# Patient Record
Sex: Male | Born: 1955 | Race: White | Hispanic: No | Smoking: Former smoker
Health system: Southern US, Community
[De-identification: ages and names within clinical notes are randomized; demographics above are authoritative.]

## PROBLEM LIST (undated history)

## (undated) DIAGNOSIS — E785 Hyperlipidemia, unspecified: Secondary | ICD-10-CM

## (undated) DIAGNOSIS — I428 Other cardiomyopathies: Principal | ICD-10-CM

## (undated) DIAGNOSIS — M1990 Unspecified osteoarthritis, unspecified site: Secondary | ICD-10-CM

## (undated) DIAGNOSIS — F419 Anxiety disorder, unspecified: Secondary | ICD-10-CM

## (undated) DIAGNOSIS — E66811 Obesity, class 1: Secondary | ICD-10-CM

## (undated) DIAGNOSIS — K219 Gastro-esophageal reflux disease without esophagitis: Secondary | ICD-10-CM

## (undated) DIAGNOSIS — I10 Essential (primary) hypertension: Secondary | ICD-10-CM

## (undated) DIAGNOSIS — F32A Depression, unspecified: Secondary | ICD-10-CM

## (undated) DIAGNOSIS — I209 Angina pectoris, unspecified: Secondary | ICD-10-CM

## (undated) DIAGNOSIS — G4730 Sleep apnea, unspecified: Secondary | ICD-10-CM

## (undated) DIAGNOSIS — F329 Major depressive disorder, single episode, unspecified: Secondary | ICD-10-CM

## (undated) DIAGNOSIS — Z8249 Family history of ischemic heart disease and other diseases of the circulatory system: Secondary | ICD-10-CM

## (undated) DIAGNOSIS — E669 Obesity, unspecified: Secondary | ICD-10-CM

## (undated) DIAGNOSIS — Z872 Personal history of diseases of the skin and subcutaneous tissue: Secondary | ICD-10-CM

## (undated) DIAGNOSIS — G4733 Obstructive sleep apnea (adult) (pediatric): Secondary | ICD-10-CM

## (undated) DIAGNOSIS — I2510 Atherosclerotic heart disease of native coronary artery without angina pectoris: Secondary | ICD-10-CM

## (undated) HISTORY — DX: Major depressive disorder, single episode, unspecified: F32.9

## (undated) HISTORY — DX: Essential (primary) hypertension: I10

## (undated) HISTORY — DX: Other cardiomyopathies: I42.8

## (undated) HISTORY — DX: Obesity, class 1: E66.811

## (undated) HISTORY — DX: Anxiety disorder, unspecified: F41.9

## (undated) HISTORY — DX: Obesity, unspecified: E66.9

## (undated) HISTORY — DX: Sleep apnea, unspecified: G47.30

## (undated) HISTORY — DX: Personal history of diseases of the skin and subcutaneous tissue: Z87.2

## (undated) HISTORY — DX: Obstructive sleep apnea (adult) (pediatric): G47.33

## (undated) HISTORY — DX: Depression, unspecified: F32.A

## (undated) HISTORY — DX: Family history of ischemic heart disease and other diseases of the circulatory system: Z82.49

## (undated) HISTORY — DX: Hyperlipidemia, unspecified: E78.5

## (undated) HISTORY — DX: Atherosclerotic heart disease of native coronary artery without angina pectoris: I25.10

## (undated) HISTORY — DX: Gastro-esophageal reflux disease without esophagitis: K21.9

## (undated) HISTORY — PX: POLYPECTOMY: SHX149

---

## 2000-08-22 ENCOUNTER — Emergency Department (HOSPITAL_COMMUNITY): Admission: EM | Admit: 2000-08-22 | Discharge: 2000-08-22 | Payer: Self-pay | Admitting: Emergency Medicine

## 2000-08-22 ENCOUNTER — Encounter: Payer: Self-pay | Admitting: Emergency Medicine

## 2001-05-09 ENCOUNTER — Ambulatory Visit (HOSPITAL_COMMUNITY): Admission: RE | Admit: 2001-05-09 | Discharge: 2001-05-09 | Payer: Self-pay | Admitting: Family Medicine

## 2001-05-09 ENCOUNTER — Encounter: Payer: Self-pay | Admitting: Family Medicine

## 2003-12-21 ENCOUNTER — Ambulatory Visit (HOSPITAL_COMMUNITY): Admission: RE | Admit: 2003-12-21 | Discharge: 2003-12-21 | Payer: Self-pay | Admitting: Neurosurgery

## 2013-05-14 ENCOUNTER — Encounter (HOSPITAL_COMMUNITY): Payer: Self-pay | Admitting: Emergency Medicine

## 2013-05-14 DIAGNOSIS — R5381 Other malaise: Secondary | ICD-10-CM | POA: Insufficient documentation

## 2013-05-14 DIAGNOSIS — F17200 Nicotine dependence, unspecified, uncomplicated: Secondary | ICD-10-CM | POA: Insufficient documentation

## 2013-05-14 DIAGNOSIS — R0609 Other forms of dyspnea: Secondary | ICD-10-CM | POA: Insufficient documentation

## 2013-05-14 DIAGNOSIS — R0602 Shortness of breath: Secondary | ICD-10-CM | POA: Insufficient documentation

## 2013-05-14 DIAGNOSIS — Z880 Allergy status to penicillin: Secondary | ICD-10-CM | POA: Insufficient documentation

## 2013-05-14 DIAGNOSIS — R0989 Other specified symptoms and signs involving the circulatory and respiratory systems: Secondary | ICD-10-CM | POA: Insufficient documentation

## 2013-05-14 DIAGNOSIS — R072 Precordial pain: Secondary | ICD-10-CM | POA: Insufficient documentation

## 2013-05-14 LAB — BASIC METABOLIC PANEL
CO2: 22 mEq/L (ref 19–32)
Calcium: 9.1 mg/dL (ref 8.4–10.5)
Creatinine, Ser: 1.17 mg/dL (ref 0.50–1.35)
GFR calc non Af Amer: 68 mL/min — ABNORMAL LOW (ref >90)
Glucose, Bld: 98 mg/dL (ref 70–99)
Potassium: 4.2 mEq/L (ref 3.5–5.1)
Sodium: 139 mEq/L (ref 135–145)

## 2013-05-14 LAB — CBC
HCT: 46 % (ref 39.0–52.0)
Hemoglobin: 16.3 g/dL (ref 13.0–17.0)
MCH: 31.6 pg (ref 26.0–34.0)
MCHC: 35.4 g/dL (ref 30.0–36.0)
Platelets: 197 10*3/uL (ref 150–400)
RBC: 5.16 MIL/uL (ref 4.22–5.81)

## 2013-05-14 LAB — POCT I-STAT TROPONIN I: Troponin i, poc: 0 ng/mL (ref 0.00–0.08)

## 2013-05-14 NOTE — ED Notes (Signed)
Pt sent from [facility].  Pt was seen there for SOB.  They advised that pt had rapid HR and sent him here for further eval.  Pt alert and oriented and in NAD.

## 2013-05-15 ENCOUNTER — Emergency Department (HOSPITAL_COMMUNITY): Payer: Self-pay

## 2013-05-15 ENCOUNTER — Emergency Department (HOSPITAL_COMMUNITY)
Admission: EM | Admit: 2013-05-15 | Discharge: 2013-05-15 | Disposition: A | Discharge status: Home / Self Care | Payer: Self-pay | Attending: Emergency Medicine | Admitting: Emergency Medicine

## 2013-05-15 ENCOUNTER — Telehealth: Payer: Self-pay | Admitting: General Surgery

## 2013-05-15 DIAGNOSIS — R079 Chest pain, unspecified: Secondary | ICD-10-CM

## 2013-05-15 MED ORDER — ASPIRIN 81 MG PO CHEW
324.0000 mg | CHEWABLE_TABLET | Freq: Once | ORAL | Status: AC
  Administered 2013-05-15: 324 mg via ORAL
  Filled 2013-05-15: qty 4

## 2013-05-15 MED ORDER — NITROGLYCERIN 0.4 MG SL SUBL: 0.4000 mg | SUBLINGUAL_TABLET | SUBLINGUAL | Status: DC | PRN

## 2013-05-15 MED ORDER — ASPIRIN 325 MG PO TABS
325.0000 mg | ORAL_TABLET | ORAL | Status: DC
  Filled 2013-05-15: qty 1

## 2013-05-15 NOTE — Telephone Encounter (Signed)
Was able to contact pt. He is aware we will be ordering a stress test for him and setting him up for a hospital f/u visit a week after stress test is done

## 2013-05-15 NOTE — Telephone Encounter (Signed)
Dr. Mayford Knife stated she just wants the pt to be set up for a OV first then we will set him up for stress test while he is in office. Sherron Monday with pt and he stated she did not have any appt until late November and his sister might have scheduled him to see another Dr. He will call me back to let me know.

## 2013-05-15 NOTE — Telephone Encounter (Signed)
Message copied by Nita Sells on Thu May 15, 2013 10:40 AM ------      Message from: Armanda Magic R      Created: Thu May 15, 2013 10:17 AM       Please order a stress myoview under my name for CP ------

## 2013-05-15 NOTE — Telephone Encounter (Signed)
Pt is set up with another Dr. On Nov 7th.

## 2013-05-15 NOTE — Progress Notes (Signed)
Please order a stress myoview for this patent under my name

## 2013-05-15 NOTE — Telephone Encounter (Signed)
Pts number is disconnected as well as emergency contact. Who referred him to [place] so I can try and get a contact number from them?

## 2013-05-15 NOTE — ED Provider Notes (Signed)
CSN: [id number]     Arrival date & time 05/14/13  1804 History   First MD Initiated Contact with Patient 05/15/13 0104     Chief Complaint  Patient presents with  . Shortness of Breath  . Chest Pain   (Consider location/radiation/quality/duration/timing/severity/associated sxs/prior Treatment) Patient is a 57 y.o. male presenting with shortness of breath and chest pain. The history is provided by the patient.  Shortness of Breath Associated symptoms: chest pain   Chest Pain Associated symptoms: shortness of breath   For the last 2 weeks, he has been having exertional dyspnea. He has to walk about 50 yards before he gets dyspnea and this is definitely worse if he goes uphill. For the last 3 days, he has also had some sharp midsternal chest pain at the same time. Pain has been 6/10 at its worst. Last episode of pain was about 1 PM today. He also has a great fatigue in his legs at the same time. He will sit for about 10 minutes and his dyspnea and chest pain will resolve his legs still feel fatigued for several hours. He denies nausea, vomiting, diaphoresis. He denies any nocturnal symptoms. As cardiac risk factors are cigarette smoking-he smokes one pack of cigarettes a day, and positive family history-2 siblings have had heart attacks and one has died. He denies history of hypertension, diabetes, hyperlipidemia. Also, he states that he generally wakes up feeling fatigued and has daytime sleepiness and his wife states that he will frequently snore.   History reviewed. No pertinent past medical history. History reviewed. No pertinent past surgical history. No family history on file. History  Substance Use Topics  . Smoking status: Current Every Day Smoker -- 2.00 packs/day    Types: Cigarettes  . Smokeless tobacco: Not on file  . Alcohol Use: Yes    Review of Systems  Respiratory: Positive for shortness of breath.   Cardiovascular: Positive for chest pain.  All other systems reviewed and  are negative.    Allergies  Penicillins  Home Medications   Current Outpatient Rx  Name  Route  Sig  Dispense  Refill  . famotidine (PEPCID) 20 MG tablet   Oral   Take 20 mg by mouth 2 (two) times daily as needed for heartburn.         Marland Kitchen ibuprofen (ADVIL,MOTRIN) 200 MG tablet   Oral   Take 200 mg by mouth every 6 (six) hours as needed for pain (pain).          BP 125/92  Pulse 78  Temp(Src) 97.8 F (36.6 C) (Oral)  Resp 16  SpO2 96% Physical Exam  Nursing note and vitals reviewed.  57 year old male, resting comfortably and in no acute distress. Vital signs are normal. Oxygen saturation is 95%, which is normal. Head is normocephalic and atraumatic. PERRLA, EOMI. Oropharynx is clear. Neck is nontender and supple without adenopathy or JVD. Back is nontender and there is no CVA tenderness. Lungs are clear without rales, wheezes, or rhonchi. Chest is nontender. Heart has regular rate and rhythm without murmur. Abdomen is soft, flat, nontender without masses or hepatosplenomegaly and peristalsis is normoactive. Extremities have no cyanosis or edema, full range of motion is present. Skin is warm and dry without rash. Neurologic: Mental status is normal, cranial nerves are intact, there are no motor or sensory deficits.  ED Course  Procedures (including critical care time) Labs Review Results for orders placed during the hospital encounter of 05/15/13  CBC  Result Value Range   WBC 13.7 (*) 4.0 - 10.5 K/uL   RBC 5.16  4.22 - 5.81 MIL/uL   Hemoglobin 16.3  13.0 - 17.0 g/dL   HCT 47.8  29.5 - 62.1 %   MCV 89.1  78.0 - 100.0 fL   MCH 31.6  26.0 - 34.0 pg   MCHC 35.4  30.0 - 36.0 g/dL   RDW 30.8  65.7 - 84.6 %   Platelets 197  150 - 400 K/uL  BASIC METABOLIC PANEL      Result Value Range   Sodium 139  135 - 145 mEq/L   Potassium 4.2  3.5 - 5.1 mEq/L   Chloride 104  96 - 112 mEq/L   CO2 22  19 - 32 mEq/L   Glucose, Bld 98  70 - 99 mg/dL   BUN 28 (*) 6 - 23  mg/dL   Creatinine, Ser 9.62  0.50 - 1.35 mg/dL   Calcium 9.1  8.4 - 95.2 mg/dL   GFR calc non Af Amer 68 (*) >90 mL/min   GFR calc Af Amer 79 (*) >90 mL/min  POCT I-STAT TROPONIN I      Result Value Range   Troponin i, poc 0.00  0.00 - 0.08 ng/mL   Comment 3            Imaging Review Dg Chest Port 1 View  05/15/2013   CLINICAL DATA:  Shortness of breath and chest pain  EXAM: PORTABLE CHEST - 1 VIEW  COMPARISON:  None.  FINDINGS: The heart size and mediastinal contours are within normal limits. Both lungs are clear of edema or consolidation. No effusion or pneumothorax. The visualized skeletal structures are unremarkable.  IMPRESSION: No evidence of active cardiopulmonary disease.   Electronically Signed   By: Tiburcio Pea M.D.   On: 05/15/2013 01:01    EKG Interpretation     Ventricular Rate:  99 PR Interval:  150 QRS Duration: 94 QT Interval:  350 QTC Calculation: 449 R Axis:   42 Text Interpretation:  Sinus rhythm with Premature atrial complexes Otherwise normal ECG When compared with ECG of 08/22/2000, No significant change was found            MDM   1. Exertional dyspnea   2. Chest pain    Chest pain and dyspnea which could be angina. Of note, his troponin was drawn approximately 6 hours after his last episode of chest pain, so he does not need followup troponin. He clearly needs stress testing done and he is referred to cardiology for stress testing as an outpatient. His leg symptoms could be related to cardiac disease, peripheral vascular disease, or spinal stenosis. His nighttime snoring and waking feeling fatigue and daytime drowsiness are suggestive of obstructive sleep apnea. This can be investigated once he has had adequate cardiac evaluation done. He is told to take aspirin once a day and is given a prescription for nitroglycerin and to return to the ED if he has symptoms that are not relieved by 3 nitroglycerin.    Dione Booze, MD 05/15/13 [phone]

## 2013-05-15 NOTE — ED Notes (Signed)
Chest pain for couple of weeks, iniitally minor but gradually worsened.  Monday patient states he came home early Monday morning because he couldn't breathe, pt states taht the pain was so bad he couldn't drive, also states at the time he was dizzy.  Since that time the chest pain and sob has been intermittently worse and patient was unable to finish work today.  Pt also states that he is unable to walk as far as usual because his legs give out and start hurting, and they are cold all the time.

## 2013-05-15 NOTE — Telephone Encounter (Signed)
Patient is an unassigned patient that was seen in the ER yesterday. Please try to get any info you can from the ER for contact numbers. If we cannot contact him by phone then needs to send him a letter to set up OV with one of the Cardiologists.

## 2013-05-17 DIAGNOSIS — I428 Other cardiomyopathies: Secondary | ICD-10-CM | POA: Insufficient documentation

## 2013-05-17 HISTORY — DX: Other cardiomyopathies: I42.8

## 2013-05-23 ENCOUNTER — Ambulatory Visit (INDEPENDENT_AMBULATORY_CARE_PROVIDER_SITE_OTHER): Payer: Self-pay | Admitting: Cardiology

## 2013-05-23 ENCOUNTER — Encounter: Payer: Self-pay | Admitting: Cardiology

## 2013-05-23 VITALS — BP 120/80 | HR 84 | Ht 71.0 in | Wt 235.0 lb

## 2013-05-23 DIAGNOSIS — E782 Mixed hyperlipidemia: Secondary | ICD-10-CM

## 2013-05-23 DIAGNOSIS — R0602 Shortness of breath: Secondary | ICD-10-CM

## 2013-05-23 DIAGNOSIS — E669 Obesity, unspecified: Secondary | ICD-10-CM

## 2013-05-23 DIAGNOSIS — I739 Peripheral vascular disease, unspecified: Secondary | ICD-10-CM

## 2013-05-23 DIAGNOSIS — I951 Orthostatic hypotension: Secondary | ICD-10-CM

## 2013-05-23 DIAGNOSIS — R079 Chest pain, unspecified: Secondary | ICD-10-CM

## 2013-05-23 LAB — LIPID PANEL
Cholesterol: 184 mg/dL (ref 0–200)
Total CHOL/HDL Ratio: 4.6 Ratio
Triglycerides: 75 mg/dL (ref <150)
VLDL: 15 mg/dL (ref 0–40)

## 2013-05-23 NOTE — Patient Instructions (Signed)
Your physician has requested that you have an echocardiogram. Echocardiography is a painless test that uses sound waves to create images of your heart. It provides your doctor with information about the size and shape of your heart and how well your heart's chambers and valves are working. This procedure takes approximately one hour. There are no restrictions for this procedure.  Your physician has requested that you have a lower  arterial duplex. This test is an ultrasound of the arteries in the legs. It looks at arterial blood flow in the legs. Allow one hour for Lower  Arterial scans. There are no restrictions or special instructions  Your physician has requested that you have en exercise stress myoview. For further information please visit [email or web address] Please follow instruction sheet, as given.  Labs -lipid  Your physician recommends that you schedule a follow-up appointment in after tests are completed.

## 2013-05-23 NOTE — Progress Notes (Signed)
PATIENT: Charles Grimes MRN: [id number]  DOB: 12-Dec-1955   DOV:05/25/2013 PCP: No PCP Per Patient  Clinic Note: Chief Complaint  Patient presents with  . Follow-up    post hosp ER, little chest pain ,sob no edema, legs hurt alot  while sittting to standing or at work    HPI: UZIAH Grimes [employer]) is a 57 y.o. male with a PMH below who presents today for cardiology evaluation after ER visit. He presented to the emergency room on 05/15/2013 complaining of chest pain and shortness of breath that had been present for 2 weeks.  Interval History: He really does not have any true medical diagnoses at present, but has a sister who had a heart attack in his 62s and died as well as a brother has had 2 heart attacks starting at age 86 of them at 17 who is still alive. He has smoked a pack a day for 40 years, and is concerned that these episodes may be related to coronary disease. He himself does not been to a doctor in years.   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 It was associated with dyspnea. It started when he was just simply bending down to start to look under a truck. The episode subsided, but was associated with dyspnea, nausea and fatigue. It resolved after about 10-15 minutes, but did recur a couple more times over the next 2 days, therefore he went to the emergency room on the 30th. His described the episode as being a sharp heaviness in the chest. He also notes somewhat chronic exertional dyspnea that has gone on since before the chest discomfort. This has however gotten worse within the last 2 weeks, incorporating the time of his chest discomfort. He says that previously he was getting short of breath walking 9300 feet up a gradual slope, that is slightly worse now. He also says that when he walks his legs feel fatigued and achy while walking, and somewhat  relieved at rest. He says that he has a hard time sleeping lying flat and has to lay on the right side. Sometimes he can get short of breath if he rolls over on his back so he is septic it is breath. Also notes feeling quite lightheaded and dizzy and weakness in his legs when he does some squatting position to standing, or from lying to standing rapidly. He has not had any syncopal episodes but he has been to the point of seeing lights flashing and stars in front of his eyes as if he may pass out.  The remainder of Cardiovascular ROS: negative for - edema, murmur, palpitations or rapid heart rate: Additional cardiac review of systems: TIA/amaurosis fugax - no Melena - no, hematochezia no; hematuria - no; nosebleeds - no; claudication - no  Past Medical History  Diagnosis Date  . Cigarette smoker two packs a day or less     Recently reduced from 2 pack per day to one pack per day  . Skin disorder      recently diagnosed, not sure of diagnosis.    Prior Cardiac Evaluation and Past Surgical History: History reviewed. No pertinent past surgical history.  Allergies  Allergen Reactions  . Penicillins Hives and Rash    Current Outpatient Prescriptions  Medication Sig Dispense Refill  . acetaminophen (TYLENOL) 325 MG tablet Take 650 mg by mouth as needed.      Marland Kitchen  aspirin EC 325 MG tablet Take 325 mg by mouth daily.      . diphenhydramine-acetaminophen (TYLENOL PM) 25-500 MG TABS Take 1 tablet by mouth at bedtime as needed.      . nitroGLYCERIN (NITROSTAT) 0.4 MG SL tablet Place 1 tablet (0.4 mg total) under the tongue every 5 (five) minutes as needed for chest pain.  30 tablet  0  . omeprazole (PRILOSEC OTC) 20 MG tablet Take 20 mg by mouth daily.      Marland Kitchen triamcinolone cream (KENALOG) 0.5 % Apply 1 application topically 3 (three) times daily.       No current facility-administered medications for this visit.    History   Social History Narrative   Married father of 2 boys. Lives with  wife, Charles Grimes, and one son.   He works as a Systems developer for [employer]   He currently smokes one pack a day, cut down from 2 packs per day.   Only occasional beer and mixed drinks.   Does not exercise because he is too tired.    ROS: A comprehensive Review of Systems - Negative except Having cold feet Respiratory ROS: positive for - Chronic productive morning cough.  PHYSICAL EXAM BP 120/80  Pulse 84  Ht 5\' 11"  (1.803 m)  Wt 235 lb (106.595 kg)  BMI 32.79 kg/m2 General appearance: alert, cooperative, appears stated age, no distress, mildly obese and Well-nourished and well-groomed. Answers questions appropriately. Neck: no adenopathy, no carotid bruit, no JVD and supple, symmetrical, trachea midline Lungs: wheezes bilaterally and Otherwise clear to auscultation. Clear to percussion. Increased AP diameter with prolonged expiratory phase. Heart: regular rate and rhythm, S1, S2 normal, no murmur, click, rub or gallop and normal apical impulse Abdomen: soft, non-tender; bowel sounds normal; no masses,  no organomegaly Extremities: extremities normal, atraumatic, no cyanosis or edema Pulses: Mildly diminished 1+ DP pulses bilaterally. Difficult to palpate PT pulses. Neurologic: Mental status: Alert, oriented, thought content appropriate Cranial nerves: normal HEENT: [place]/AT, EOMI, MMM, anicteric sclera  WUJ:[id number] today: Yes Rate: 84 , Rhythm: NSR, normal ECG;   Recent Labs: Chemistry panel noted in Epic  ASSESSMENT / PLAN: Chest pain with moderate risk for cardiac etiology 2 prolonged episodes of chest discomfort in a patient with history of smoking, family history of premature disease in brother and sister along with exertional dyspnea. This is a least moderate risk/intermediate probability, and he is able to exercise with interpretable EKG.  Plan: Exercise/Treadmill Myoview; further risk stratification with lipid profile.  Chemistries were already checked in the ER  Shortness of breath on exertion With his smoking history, this is suggestive of possible COPD, but would the recent episode of chest discomfort with dyspnea as left-sided substernal chest pain, cannot exclude cardiac etiology.  With significant smoking history, and chronic productive daily cough, this is highly suggestive of COPD. Would need PFTs following cardiac evaluation.  Plan: Treadmill/Exercise Myoview, 2-D echocardiogram (to assess both systolic and diastolic function as well as RV pressures);  Intermittent claudication Cold feet with legs aching with ambulation that limits activity is concerning for intermittent claudication in a patient with long smoking history.  Plan: Lower Extremity Arterial Dopplers with ABIs  Obesity (BMI 30-39.9) Currently, not wanting to exert himself much, due to fear of potential cardiac issues.  Once we resolve the cardiac issues, he'll need to start working into some type of exercise. Talked about dietary modifications.  Orthostatic hypotension Race changes he can get very lightheaded  and dizzy and nauseated with simply standing up. Would make it difficult consider adding in expression.  Plan: Recommend increased hydration. Would consider the possibility of tilt table testing just to confirm if he has a full syncopal episode.   Orders Placed This Encounter  Procedures  . Lipid panel    Order Specific Question:  Has the patient fasted?    Answer:  Yes  . Myocardial Perfusion Imaging    Standing Status: Future     Number of Occurrences:      Standing Expiration Date: 05/23/2014    Order Specific Question:  Where should this test be performed    Answer:  [facility]    Order Specific Question:  Type of stress    Answer:  Exercise    Order Specific Question:  Patient weight in lbs    Answer:  235  . EKG 12-Lead  . 2D Echocardiogram without contrast    Standing Status: Future     Number of  Occurrences:      Standing Expiration Date: 05/23/2014    Order Specific Question:  Type of Echo    Answer:  Complete    Order Specific Question:  Where should this test be performed    Answer:  [facility]    Order Specific Question:  Reason for exam-Echo    Answer:  Dyspnea  786.09    Order Specific Question:  Reason for exam-Echo    Answer:  Chest Pain  786.50  . Lower Extremity Arterial Duplex Bilateral    Sx claudication    Standing Status: Future     Number of Occurrences:      Standing Expiration Date: 05/23/2014    Order Specific Question:  Laterality    Answer:  Bilateral    Order Specific Question:  Where should this test be performed:    Answer:  [facility]   Followup: 4-6 weeks  Rush Salce W. Herbie Baltimore, M.D., M.S. [facility] [address] [place], [place]  [address]  [phone] Pager # [phone]

## 2013-05-25 ENCOUNTER — Encounter: Payer: Self-pay | Admitting: Cardiology

## 2013-05-25 DIAGNOSIS — E669 Obesity, unspecified: Secondary | ICD-10-CM | POA: Insufficient documentation

## 2013-05-25 DIAGNOSIS — I951 Orthostatic hypotension: Secondary | ICD-10-CM | POA: Insufficient documentation

## 2013-05-25 NOTE — Assessment & Plan Note (Signed)
Race changes he can get very lightheaded and dizzy and nauseated with simply standing up. Would make it difficult consider adding in expression.  Plan: Recommend increased hydration. Would consider the possibility of tilt table testing just to confirm if he has a full syncopal episode.

## 2013-05-25 NOTE — Assessment & Plan Note (Addendum)
2 prolonged episodes of chest discomfort in a patient with history of smoking, family history of premature disease in brother and sister along with exertional dyspnea. This is a least moderate risk/intermediate probability, and he is able to exercise with interpretable EKG.  Plan: Exercise/Treadmill Myoview; further risk stratification with lipid profile. Chemistries were already checked in the ER

## 2013-05-25 NOTE — Assessment & Plan Note (Signed)
Cold feet with legs aching with ambulation that limits activity is concerning for intermittent claudication in a patient with long smoking history.  Plan: Lower Extremity Arterial Dopplers with ABIs

## 2013-05-25 NOTE — Assessment & Plan Note (Addendum)
With his smoking history, this is suggestive of possible COPD, but would the recent episode of chest discomfort with dyspnea as left-sided substernal chest pain, cannot exclude cardiac etiology.  With significant smoking history, and chronic productive daily cough, this is highly suggestive of COPD. Would need PFTs following cardiac evaluation.  Plan: Treadmill/Exercise Myoview, 2-D echocardiogram (to assess both systolic and diastolic function as well as RV pressures);

## 2013-05-25 NOTE — Assessment & Plan Note (Signed)
Currently, not wanting to exert himself much, due to fear of potential cardiac issues.  Once we resolve the cardiac issues, he'll need to start working into some type of exercise. Talked about dietary modifications.

## 2013-06-03 ENCOUNTER — Ambulatory Visit (HOSPITAL_COMMUNITY)
Admission: RE | Admit: 2013-06-03 | Discharge: 2013-06-03 | Disposition: A | Discharge status: Home / Self Care | Payer: Self-pay | Source: Ambulatory Visit | Attending: Internal Medicine | Admitting: Internal Medicine

## 2013-06-03 DIAGNOSIS — R0602 Shortness of breath: Secondary | ICD-10-CM | POA: Insufficient documentation

## 2013-06-03 DIAGNOSIS — R079 Chest pain, unspecified: Secondary | ICD-10-CM | POA: Insufficient documentation

## 2013-06-03 HISTORY — PX: NM MYOVIEW LTD: HXRAD82

## 2013-06-03 MED ORDER — TECHNETIUM TC 99M SESTAMIBI GENERIC - CARDIOLITE
10.6000 | Freq: Once | INTRAVENOUS | Status: AC | PRN
  Administered 2013-06-03: 11 via INTRAVENOUS

## 2013-06-03 MED ORDER — TECHNETIUM TC 99M SESTAMIBI GENERIC - CARDIOLITE
31.4000 | Freq: Once | INTRAVENOUS | Status: AC | PRN
  Administered 2013-06-03: 31 via INTRAVENOUS

## 2013-06-03 NOTE — Procedures (Addendum)
[facility] [address] [address] [place] [address] [place] [address] [phone]  Cardiology Nuclear Med Study  Charles Grimes is a 57 y.o. male     MRN : [id number]     DOB: January 21, 1956  Procedure Date: 06/03/2013  Nuclear Med Background Indication for Stress Test:  Evaluation for Ischemia History:  Patient denies any previous cardiac or respiratory history. Cardiac Risk Factors: Family History - CAD, Obesity and Smoker  Symptoms:  Chest Pain, Dizziness, DOE, Fatigue, Light-Headedness, Near Syncope and SOB   Nuclear Pre-Procedure Caffeine/Decaff Intake:  12:00am NPO After: 10am   IV Site: R Hand  IV 0.9% NS with Angio Cath:  22g  Chest Size (in):  46"  IV Started by: Emmit Pomfret, RN  Height: 5\' 11"  (1.803 m)  Cup Size: n/a  BMI:  Body mass index is 32.79 kg/(m^2). Weight:  235 lb (106.595 kg)   Tech Comments:  n/a    Nuclear Med Study 1 or 2 day study: 1 day  Stress Test Type:  Stress  Order Authorizing Provider:  Bryan Lemma, MD   Resting Radionuclide: Technetium 39m Sestamibi  Resting Radionuclide Dose: 10.6 mCi   Stress Radionuclide:  Technetium 73m Sestamibi  Stress Radionuclide Dose: 31.4 mCi           Stress Protocol Rest HR:86 Stress HR: 141  Rest BP: 140/99 Stress BP: 171/86  Exercise Time (min): 6:01 METS: 6.80   Predicted Max HR: 164 bpm % Max HR: 85.98 bpm Rate Pressure Product: 21308  Dose of Adenosine (mg):  n/a Dose of Lexiscan: n/a mg  Dose of Atropine (mg): n/a Dose of Dobutamine: n/a mcg/kg/min (at max HR)  Stress Test Technologist: Ernestene Mention, CCT Nuclear Technologist: Gonzella Lex, CNMT   Rest Procedure:  Myocardial perfusion imaging was performed at rest 45 minutes following the intravenous administration of Technetium 44m Sestamibi. Stress Procedure:  The patient performed treadmill exercise using a Bruce  Protocol for 6 minutes 1 second. The patient stopped due to extreme shortness of breath and  fatigue. Patient denied any chest pain.  There were no significant ST-T wave changes.  Technetium 55m Sestamibi was injected at peak exercise and myocardial perfusion imaging was performed after a brief delay.  Transient Ischemic Dilatation (Normal <1.22):  0.96 Lung/Heart Ratio (Normal <0.45):  0.36 QGS EDV:  120 ml QGS ESV:  67 ml LV Ejection Fraction: 44%  Signed by       Rest ECG: NSR - Normal EKG; isolated PAC  Stress ECG: No significant change from baseline ECG; rare PVCs.  QPS Raw Data Images:  Normal; no motion artifact; normal heart/lung ratio. Stress Images:  Normal homogeneous uptake in all areas of the myocardium. Rest Images:  Normal homogeneous uptake in all areas of the myocardium. Subtraction (SDS):  Normal  Impression Exercise Capacity:  Lexiscan with no exercise. BP Response:  Normal blood pressure response. Clinical Symptoms:  Mild shortness of breath ECG Impression:  No significant ST segment change suggestive of ischemia. Comparison with Prior Nuclear Study: No images to compare  Overall Impression:  Low risk stress nuclear study without evidence for ischemia.  LV Wall Motion:  Mild global LV dysfunction, EF 44% without discreet wall motion abnormalities.   Lennette Bihari, MD  06/03/2013 4:02 PM

## 2013-06-11 ENCOUNTER — Ambulatory Visit (HOSPITAL_COMMUNITY)
Admission: RE | Admit: 2013-06-11 | Discharge: 2013-06-11 | Disposition: A | Discharge status: Home / Self Care | Payer: Self-pay | Source: Ambulatory Visit | Attending: Cardiovascular Disease | Admitting: Cardiovascular Disease

## 2013-06-11 ENCOUNTER — Ambulatory Visit (HOSPITAL_COMMUNITY): Payer: Self-pay

## 2013-06-11 DIAGNOSIS — R0602 Shortness of breath: Secondary | ICD-10-CM

## 2013-06-11 DIAGNOSIS — E669 Obesity, unspecified: Secondary | ICD-10-CM | POA: Insufficient documentation

## 2013-06-11 DIAGNOSIS — R0609 Other forms of dyspnea: Secondary | ICD-10-CM | POA: Insufficient documentation

## 2013-06-11 DIAGNOSIS — R072 Precordial pain: Secondary | ICD-10-CM

## 2013-06-11 DIAGNOSIS — I951 Orthostatic hypotension: Secondary | ICD-10-CM | POA: Insufficient documentation

## 2013-06-11 DIAGNOSIS — F17200 Nicotine dependence, unspecified, uncomplicated: Secondary | ICD-10-CM | POA: Insufficient documentation

## 2013-06-11 DIAGNOSIS — I70219 Atherosclerosis of native arteries of extremities with intermittent claudication, unspecified extremity: Secondary | ICD-10-CM | POA: Insufficient documentation

## 2013-06-11 DIAGNOSIS — I739 Peripheral vascular disease, unspecified: Secondary | ICD-10-CM

## 2013-06-11 DIAGNOSIS — R079 Chest pain, unspecified: Secondary | ICD-10-CM | POA: Insufficient documentation

## 2013-06-11 DIAGNOSIS — R0989 Other specified symptoms and signs involving the circulatory and respiratory systems: Secondary | ICD-10-CM | POA: Insufficient documentation

## 2013-06-11 HISTORY — PX: OTHER SURGICAL HISTORY: SHX169

## 2013-06-11 HISTORY — PX: TRANSTHORACIC ECHOCARDIOGRAM: SHX275

## 2013-06-11 NOTE — Progress Notes (Signed)
  Echocardiogram 2D Echocardiogram has been performed.  Charles Grimes 06/11/2013, 9:17 AM

## 2013-06-11 NOTE — Progress Notes (Signed)
Lower Extremity Arterial Duplex Completed. °Brianna L Mazza,RVT °

## 2013-06-16 HISTORY — PX: OTHER SURGICAL HISTORY: SHX169

## 2013-06-17 ENCOUNTER — Ambulatory Visit (INDEPENDENT_AMBULATORY_CARE_PROVIDER_SITE_OTHER): Payer: Self-pay | Admitting: Cardiology

## 2013-06-17 ENCOUNTER — Encounter: Payer: Self-pay | Admitting: Cardiology

## 2013-06-17 VITALS — BP 122/86 | HR 87 | Ht 71.0 in | Wt 240.9 lb

## 2013-06-17 DIAGNOSIS — Z832 Family history of diseases of the blood and blood-forming organs and certain disorders involving the immune mechanism: Secondary | ICD-10-CM | POA: Insufficient documentation

## 2013-06-17 DIAGNOSIS — R079 Chest pain, unspecified: Secondary | ICD-10-CM

## 2013-06-17 DIAGNOSIS — I739 Peripheral vascular disease, unspecified: Secondary | ICD-10-CM

## 2013-06-17 DIAGNOSIS — R0602 Shortness of breath: Secondary | ICD-10-CM

## 2013-06-17 DIAGNOSIS — E669 Obesity, unspecified: Secondary | ICD-10-CM

## 2013-06-17 MED ORDER — ISOSORBIDE MONONITRATE ER 30 MG PO TB24: 30.0000 mg | ORAL_TABLET | Freq: Every day | ORAL | Status: DC

## 2013-06-17 MED ORDER — LOSARTAN POTASSIUM 25 MG PO TABS: 25.0000 mg | ORAL_TABLET | Freq: Every day | ORAL | Status: DC

## 2013-06-17 MED ORDER — ATORVASTATIN CALCIUM 20 MG PO TABS: 20.0000 mg | ORAL_TABLET | Freq: Every day | ORAL | Status: DC

## 2013-06-17 NOTE — Patient Instructions (Addendum)
I am a bit concerned that your heart function is below normal despite having a "negative stress test".  I am still waiting for the Leg Artery Doppler results - if abnormal, I will have you see Dr. Allyson Sabal (in this office, who dose leg artery interventions).  I am going to order lung function tests to check on lung disease (COPD).  I am concerned that you do have artery disease (atherosclerosis) -- so will start you on a cholesterol medication Lipitor.  A Blood Pressure medication - Losartan (take this on 1/2 tab daily x 8 days then increase to full dose. A long acting Nitroglycerin called IMDUR.  Marykay Lex, MD  I will see you back in January.  6 weeks

## 2013-06-17 NOTE — Assessment & Plan Note (Signed)
As I mentioned in my last note, this could very well be COPD related. His mildly reduced EF would not explain that. He did not have significant diastolic dysfunction. He really has no other signs and symptoms of heart failure to go along with it either. Differential diagnoses at this time would be COPD versus CAD.   Plan: Check PFTs. No signs of pulmonary hypertension on echo. Depending on results of PFTs would potentially consider initiation of COPD therapy He probably also has signs and symptoms of OSA that I plan to evaluate later

## 2013-06-17 NOTE — Assessment & Plan Note (Addendum)
Variation in the has a nonischemic Myoview, but concerning point is that DES is decreased. This is in conjunction with anterior wall motion abnormality on echo. I am seriously contemplating the concept of cardiac catheterization. I would like to see results of his Lortab he Dopplers. If there is consideration for possible lower cineangiogram I would elect to do cardiac catheterization at the same time.  Plan: Given his family history and existing risk factors, I plan to treat according to the thought that this could very OB due to CAD.   We'll start him on a statin at low dose (Lipitor 20 mg) since his lipids are pretty well controlled. Only the LDL is a little low.  Add ARB at low dose. He has adequate blood pressure but would allow for some afterload reduction. With no sign of volume overload he would not need a diuretic  Add Imdur. He was reluctant to use sublingual nitroglycerin.

## 2013-06-17 NOTE — Progress Notes (Signed)
PATIENT: Charles Grimes MRN: [id number]  DOB: 1955-08-09   DOV:06/17/2013 PCP: No PCP Per Patient  Clinic Note: Chief Complaint  Patient presents with  . Follow-up    Test results. C/o chest pain, little shortness of breath, legs feel fatigued, feet feel cold all the time. Father has Factor 5 Leiden Lupus Anticoagulant.    HPI: Charles Grimes is a 57 y.o.  male with a PMH below who presents today for followup after a nuclear stress test and echocardiogram. He really doesn't have an previous cardiac history or documented medical history. He is a long-term history of smoking. OM on November 9 followup from emergency room visit for chest discomfort. He is a significant family history of CAD. Out of concern for at least moderate risk for cardiac etiology of his symptoms. I therefore referred him for a Myoview stress test while this did not show signs of ischemia, there was mildly reduced EF of 44%. I therefore followed up with an echocardiogram which showed EF of 45-50%, showing consistency. There was also a question of possible anterior wall motion abnormality. He also STEMI arterial Dopplers done for possible claudication symptoms, these have been performed but have yet to be read.  Interval History: Now presents in followup. He still notes having exertional dyspnea, more so than pressure. He says that simply walking up about 250 foot driveway from the street to his house causing him to be totally fatigued and winded. Also noted bilateral legs hurt mostly in the calves.  This past Saturday he had an episode of hernia underneath his left breast. This is somewhat reproducible pain made worse with coughing. He continues to have his daytime morning cough. He denies any resting dyspnea or chest discomfort. He is not any further episodes of the tendon 15 minutes of chest discomfort he had before last visit.  The remainder of Cardiovascular ROS: negative for - edema, irregular heartbeat, loss of  consciousness, murmur, orthopnea, palpitations, paroxysmal nocturnal dyspnea or rapid heart rate: Additional cardiac review of systems: Lightheadedness - no, dizziness - no, syncope/near-syncope - no; TIA/amaurosis fugax - no Melena - no, hematochezia no; hematuria - no; nosebleeds - no; claudication - yes \ He also notes nocturnal dyspnea, waking up gasping for air. No PND. This can also occur she simply lies down on the couch, and happens to fall asleep.  Past Medical History  Diagnosis Date  . Cigarette smoker two packs a day or less     Recently reduced from 2 pack per day to one pack per day  . Skin disorder      recently diagnosed, not sure of diagnosis.   Prior Cardiac Evaluation and Past Surgical History: Past Surgical History  Procedure Laterality Date  . Nm myoview ltd  06/03/2013    Low risk study with no ischemia. EF roughly 44% with no regional wall motion abnormalities noted  . Transthoracic echocardiogram  06/11/2013    Mildly reduced EF: 45-50%. Mild anterior hypokinesis with incoordinate septal motion. No evidence of pulmonary hypertension  . Lower extremity arterial dopplers  06/11/2013    Results pending    Allergies  Allergen Reactions  . Penicillins Hives and Rash    Current Outpatient Prescriptions  Medication Sig Dispense Refill  . acetaminophen (TYLENOL) 325 MG tablet Take 650 mg by mouth as needed.      Marland Kitchen aspirin EC 325 MG tablet Take 325 mg by mouth daily.      . diphenhydramine-acetaminophen (TYLENOL PM) 25-500 MG TABS Take  1 tablet by mouth at bedtime as needed.      . nitroGLYCERIN (NITROSTAT) 0.4 MG SL tablet Place 1 tablet (0.4 mg total) under the tongue every 5 (five) minutes as needed for chest pain.  30 tablet  0  . omeprazole (PRILOSEC OTC) 20 MG tablet Take 20 mg by mouth daily.      Marland Kitchen triamcinolone cream (KENALOG) 0.5 % Apply 1 application topically 3 (three) times daily.      Marland Kitchen atorvastatin (LIPITOR) 20 MG tablet Take 1 tablet (20 mg total)  by mouth daily.  90 tablet  3  . isosorbide mononitrate (IMDUR) 30 MG 24 hr tablet Take 1 tablet (30 mg total) by mouth daily.  90 tablet  3  . losartan (COZAAR) 25 MG tablet Take 1 tablet (25 mg total) by mouth daily.  90 tablet  3   No current facility-administered medications for this visit.    History   Social History Narrative   Married father of 2 boys. Lives with wife, Albin Felling, and one son.   He works as a Systems developer for [employer]   He currently smokes one pack a day, cut down from 2 packs per day.   Only occasional beer and mixed drinks.   Does not exercise because he is too tired.   family history includes Atrial fibrillation in his mother; COPD in his mother; Diabetes in his paternal grandmother; Factor V Leiden deficiency in his father; Heart attack (age of onset: 7) in his maternal grandmother; Heart attack (age of onset: 41) in his sister; Heart attack (age of onset: 8) in his brother; Heart failure in his paternal grandmother; Hypertension in his father and mother; Lung cancer in his father. Father was distally is less than a year to live from his lung cancer  ROS: A comprehensive Review of Systems - Negative except Persistently has cold feet. Daily morning cough.  PHYSICAL EXAM BP 122/86  Pulse 87  Ht 5\' 11"  (1.803 m)  Wt 240 lb 14.4 oz (109.272 kg)  BMI 33.61 kg/m2 General appearance: alert, cooperative, appears stated age, no distress, mildly obese and Well-nourished and well-groomed. Answers questions appropriately.  Neck: no LAN, no carotid bruit, no JVD and supple, symmetrical, trachea midline  Lungs: wheezes bilaterally and Otherwise clear to auscultation. Clear to percussion. Increased AP diameter with prolonged expiratory phase.  Heart: RRR, S1, S2 normal, no M/R/G, and normal apical impulse  Abdomen: soft, non-tender; bowel sounds normal; no masses, no organomegaly  Extremities: extremities normal,  atraumatic, no cyanosis or edema  Pulses: Mildly diminished 1+ DP pulses bilaterally. Difficult to palpate PT pulses.  Neurologic: Mental status: Alert, oriented, thought content appropriate  Cranial nerves: normal  HEENT: [place]/AT, EOMI, MMM, anicteric sclera  [id number]:[id number] today: Yes Rate:72 , Rhythm: NSR, Non-specific ST-T abnormalities: otherwise normal  Recent Labs: Reviewed in Epic; TC 184, TG 75, LDL 129*, HDL 40  ASSESSMENT / PLAN: Shortness of breath on exertion As I mentioned in my last note, this could very well be COPD related. His mildly reduced EF would not explain that. He did not have significant diastolic dysfunction. He really has no other signs and symptoms of heart failure to go along with it either. Differential diagnoses at this time would be COPD versus CAD.   Plan: Check PFTs. No signs of pulmonary hypertension on echo. Depending on results of PFTs would potentially consider initiation of COPD therapy He probably also has signs and symptoms of OSA  that I plan to evaluate later  Chest pain with moderate risk for cardiac etiology Variation in the has a nonischemic Myoview, but concerning point is that DES is decreased. This is in conjunction with anterior wall motion abnormality on echo. I am seriously contemplating the concept of cardiac catheterization. I would like to see results of his Lortab he Dopplers. If there is consideration for possible lower cineangiogram I would elect to do cardiac catheterization at the same time.  Plan: Given his family history and existing risk factors, I plan to treat according to the thought that this could very OB due to CAD.   We'll start him on a statin at low dose (Lipitor 20 mg) since his lipids are pretty well controlled. Only the LDL is a little low.  Add ARB at low dose. He has adequate blood pressure but would allow for some afterload reduction. With no sign of volume overload he would not need a diuretic  Add Imdur. He was  reluctant to use sublingual nitroglycerin.   Intermittent claudication Awaiting results of Lower Extremity Arterial Dopplers. I would anticipate probably having some evidence of PAD. If this is a significant result, I would refer him to Dr. Allyson Sabal. The follow be that we can perform both coronary angiography as well as peripheral angiography.  Family history of factor V Leiden deficiency With his next lab draw, we'll check factor V Leiden as well as lupus anticoagulant lab values.  Obesity (BMI 30-39.9) Discussed dietary modifications. We'll need to get the cardiac and potential PAD issues resolved in order to allow him to begin exercising.    Orders Placed This Encounter  Procedures  . PFT W/ BRONCH (MET-TEST)    Standing Status: Future     Number of Occurrences:      Standing Expiration Date: 06/17/2014  . EKG 12-Lead   Meds ordered this encounter  Medications  . atorvastatin (LIPITOR) 20 MG tablet    Sig: Take 1 tablet (20 mg total) by mouth daily.    Dispense:  90 tablet    Refill:  3  . losartan (COZAAR) 25 MG tablet    Sig: Take 1 tablet (25 mg total) by mouth daily.    Dispense:  90 tablet    Refill:  3  . isosorbide mononitrate (IMDUR) 30 MG 24 hr tablet    Sig: Take 1 tablet (30 mg total) by mouth daily.    Dispense:  90 tablet    Refill:  3    Followup: 4-6 weeks  DAVID W. Herbie Baltimore, M.D., M.S. [facility] [address] [place], [place]  [address]  [phone] Pager # [phone]

## 2013-06-17 NOTE — Assessment & Plan Note (Signed)
Awaiting results of Lower Extremity Arterial Dopplers. I would anticipate probably having some evidence of PAD. If this is a significant result, I would refer him to Dr. Allyson Sabal. The follow be that we can perform both coronary angiography as well as peripheral angiography.

## 2013-06-17 NOTE — Assessment & Plan Note (Signed)
Discussed dietary modifications. We'll need to get the cardiac and potential PAD issues resolved in order to allow him to begin exercising.

## 2013-06-17 NOTE — Assessment & Plan Note (Signed)
With his next lab draw, we'll check factor V Leiden as well as lupus anticoagulant lab values.

## 2013-06-18 ENCOUNTER — Encounter: Payer: Self-pay | Admitting: Cardiology

## 2013-06-23 ENCOUNTER — Encounter (INDEPENDENT_AMBULATORY_CARE_PROVIDER_SITE_OTHER): Payer: Self-pay

## 2013-06-23 ENCOUNTER — Telehealth: Payer: Self-pay | Admitting: Cardiology

## 2013-06-23 DIAGNOSIS — R0609 Other forms of dyspnea: Secondary | ICD-10-CM

## 2013-06-23 DIAGNOSIS — R0602 Shortness of breath: Secondary | ICD-10-CM

## 2013-06-23 NOTE — Telephone Encounter (Signed)
Will speak to patient after his Met test. Discussed with  Wilburt Finlay. Can reduce Imdur to 1/2 tablet a day if needed.

## 2013-06-23 NOTE — Telephone Encounter (Signed)
Sounds OK to quit.    Marykay Lex, MD

## 2013-06-23 NOTE — Telephone Encounter (Signed)
Pt is here today for a Met-Test. He was put on Nitroglycerin meds and he is complaining on terrible headaches. He would like to speak a nurse regarding this today. He wants to know if there is another medication that would help.

## 2013-06-23 NOTE — Telephone Encounter (Signed)
Spoke to patient . He states he has a headache 10 -12 hours after taking Imdur.Patient states he has taken a whole tablet on daily and tried 1/2 tablet both caused headaches. He tried taking to TYLENOL to reduce the headache with no relief.   RN reviewed with Wilburt Finlay PA,- per order stop IMDUR.  May use NTG SL if needed.   Will defer to Dr Herbie Baltimore to see if appointment need to be moved closer from 07/28/13

## 2013-06-24 NOTE — Telephone Encounter (Signed)
Does patient's appmt need to be moved closer from 07/28/13? - OK to stop Imdur and use SL ntg prn  ** from [place], [place] message yesterday

## 2013-06-24 NOTE — Telephone Encounter (Signed)
Only if symptoms are progressing. Nonischemic Myoview.

## 2013-06-27 NOTE — Telephone Encounter (Signed)
Left message on patient voicemail. Per Dr Kathy Breach appointment for 07/28/13. At that time all results will be given to patient. Any question left message to call back.

## 2013-07-17 HISTORY — PX: COLONOSCOPY: SHX174

## 2013-07-28 ENCOUNTER — Ambulatory Visit (INDEPENDENT_AMBULATORY_CARE_PROVIDER_SITE_OTHER): Payer: Self-pay | Admitting: Cardiology

## 2013-07-28 ENCOUNTER — Encounter: Payer: Self-pay | Admitting: Cardiology

## 2013-07-28 VITALS — BP 152/80 | HR 88 | Ht 71.0 in | Wt 243.8 lb

## 2013-07-28 DIAGNOSIS — R0602 Shortness of breath: Secondary | ICD-10-CM

## 2013-07-28 DIAGNOSIS — I429 Cardiomyopathy, unspecified: Secondary | ICD-10-CM

## 2013-07-28 DIAGNOSIS — I10 Essential (primary) hypertension: Secondary | ICD-10-CM | POA: Insufficient documentation

## 2013-07-28 DIAGNOSIS — Z8249 Family history of ischemic heart disease and other diseases of the circulatory system: Secondary | ICD-10-CM | POA: Insufficient documentation

## 2013-07-28 DIAGNOSIS — R079 Chest pain, unspecified: Secondary | ICD-10-CM

## 2013-07-28 DIAGNOSIS — I428 Other cardiomyopathies: Secondary | ICD-10-CM

## 2013-07-28 DIAGNOSIS — E785 Hyperlipidemia, unspecified: Secondary | ICD-10-CM | POA: Insufficient documentation

## 2013-07-28 MED ORDER — LOSARTAN POTASSIUM 50 MG PO TABS: 50.0000 mg | ORAL_TABLET | Freq: Every day | ORAL | Status: DC

## 2013-07-28 NOTE — Assessment & Plan Note (Signed)
BP is high to day.  With reduced EF, will increase ARB for additional afterload reduction.

## 2013-07-28 NOTE — Assessment & Plan Note (Signed)
On statin x ~2 months.  Will check Lipid Panel along with Pre-Cath Labs in February.

## 2013-07-28 NOTE — Assessment & Plan Note (Signed)
Now with relatively normal PFTs - only reduced DLCO (that may need to be evaluated by High Res CT Chest) if no Cardiac Etiology is found.  I am still not convinced that this is not related to CAD - with a reduced EF otherwise unexplained, the Myoview is not conclusive as balanced ischemia is still a likely possibility in this pt with HTN, HLD, chronic smoking, & FH of premature CAD.   At this point, I think that with persistent exertional dyspnea and the data above, definitive invasive evaluation is the only way to rule out Multivessel CAD.   I would recommend R&LHC, but he(& his wife) are still in the process of securing financial assistance & would like to wait until they hear back after submitting the paperwork.   We will check back with them in the beginning of February & plan to schedule R&LHC at that time.

## 2013-07-28 NOTE — Progress Notes (Signed)
PATIENT: RUSTY AMPARANO MRN: [id number]  DOB: 11-27-1955   DOV:07/28/2013 PCP: No PCP Per Patient  Clinic Note: Chief Complaint  Patient presents with  . Follow-up    test results PFT,NO CHEST PAIN ,SOB ,NO EDEMA, TIRED ALLTHE TIME- STILL HAVETHE  HURTING UNDER HIS LEFT RIB CAGE    HPI: ARMONE DRAKES is a 58 y.o.  male with a PMH below who presents today for close follow-up evaluation of exertional dyspnea & chest pressure.  So far he has had a non-ischemic Myoview with reduced EF confirmed by Echo. But no evidnec of significantly increased LVEDP or RV Pressures.  Lower Extremity Dopplers for SSx of claudication did not reveal obstructive PAD.  PFTs were only notable for reduced DLCO with no significant evidence to suggest COPD despite wheezing on exam.   He is a long-term history of smoking. OM on November 9 followup from emergency room visit for chest discomfort. He is a significant family history of CAD.  Interval History:  He still notes having exertional dyspnea, more so than pressure -- actually has not had much of the chest pressure since the initial visit.  He does have some sharp pain along the left chest wall that occurs at rest.  The dyspnea is the more profound symptms -- simply walking up about 250 foot driveway from the street to his house causing him to be totally fatigued and winded. Also noted bilateral legs hurt mostly in the calves that cannot be explained by PAD.  He also has the brief "pinching" discomfort under his left breast that was somewhat reproducible pain made worse with coughing - as is the Left parasternal pain.Marland Kitchen He continues to have his daytime morning cough - but no more or less than usual. He denies any resting dyspnea  He is not any further episodes of chest pressure that he had prior to the 1st visit.  The remainder of Cardiovascular ROS: positive for - dyspnea on exertion, orthopnea and shortness of breath negative for - edema, irregular heartbeat, loss of  consciousness, murmur, palpitations, paroxysmal nocturnal dyspnea or rapid heart rate: Lightheadedness - no, dizziness - no, syncope/near-syncope - no; TIA/amaurosis fugax - no Melena - no, hematochezia no; hematuria - no; nosebleeds - no; claudication - yes  Past Medical History  Diagnosis Date  . Cigarette smoker two packs a day or less     Recently reduced from 2 pack per day to one pack per day  . Skin disorder      recently diagnosed, not sure of diagnosis.  . Hypertension   . Obesity (BMI 30.0-34.9)   . Current smoker     ~1 ppd  . Family history of premature CAD   . Cardiomyopathy 05/2013    As yet unclear Etiology - EF ~45% by Echo & Myoview  . Dyslipidemia    Prior Cardiac Evaluation and Past Surgical History: Past Surgical History  Procedure Laterality Date  . Nm myoview ltd  06/03/2013    Low risk study with no ischemia. EF roughly 44% with no regional wall motion abnormalities noted  . Transthoracic echocardiogram  06/11/2013    Mildly reduced EF: 45-50%. Mild anterior hypokinesis with incoordinate septal motion. No evidence of pulmonary hypertension  . Lower extremity arterial dopplers  06/11/2013    No occlusive disease  . Pfts  06/2013    Normal Volumes &  Spirometry; Moderately reduced DLCO    Allergies  Allergen Reactions  . Isosorbide Other (See Comments)  CONTINUOUS HEADACHE   . Penicillins Hives and Rash    Current Outpatient Prescriptions  Medication Sig Dispense Refill  . acetaminophen (TYLENOL) 325 MG tablet Take 650 mg by mouth as needed.      Marland Kitchen aspirin EC 325 MG tablet Take 325 mg by mouth daily.      Marland Kitchen atorvastatin (LIPITOR) 20 MG tablet Take 1 tablet (20 mg total) by mouth daily.  90 tablet  3  . nitroGLYCERIN (NITROSTAT) 0.4 MG SL tablet Place 1 tablet (0.4 mg total) under the tongue every 5 (five) minutes as needed for chest pain.  30 tablet  0  . omeprazole (PRILOSEC OTC) 20 MG tablet Take 20 mg by mouth daily.      Marland Kitchen triamcinolone cream  (KENALOG) 0.5 % Apply 1 application topically 3 (three) times daily.      . diphenhydramine-acetaminophen (TYLENOL PM) 25-500 MG TABS Take 1 tablet by mouth at bedtime as needed.      Marland Kitchen losartan (COZAAR) 50 MG tablet Take 1 tablet (50 mg total) by mouth daily.  30 tablet  11   No current facility-administered medications for this visit.    History   Social History Narrative   Married father of 2 boys. Lives with wife, [name], and one son.   He works as a Designer, television/film set for [employer]   He currently smokes one pack a day, cut down from 2 packs per day.   Only occasional beer and mixed drinks.   Does not exercise because he is too tired.   Famly History: family history includes Atrial fibrillation in his mother; COPD in his mother; Diabetes in his paternal grandmother; Factor V Leiden deficiency in his father; Heart attack (age of onset: 73) in his maternal grandmother; Heart attack (age of onset: 68) in his sister; Heart attack (age of onset: 14) in his brother; Heart failure in his paternal grandmother; Hypertension in his father and mother; Lung cancer in his father. Father was distally is less than a year to live from his lung cancer  ROS: A comprehensive Review of Systems - Negative except Persistently has cold feet. Daily morning cough.  PHYSICAL EXAM BP 152/80  Pulse 88  Ht 5\' 11"  (1.803 m)  Wt 243 lb 12.8 oz (110.587 kg)  BMI 34.02 kg/m2 General appearance: alert, cooperative, appears stated age, no distress, mildly obese and Well-nourished and well-groomed. Answers questions appropriately.  Neck: no LAN, no carotid bruit, no JVD and supple, symmetrical, trachea midline  Lungs: wheezes bilaterally and Otherwise clear to auscultation. Clear to percussion. Increased AP diameter with prolonged expiratory phase.  Heart: RRR, S1, S2 normal, no M/R/G, and normal apical impulse  Abdomen: soft, non-tender; bowel sounds normal; no masses,  no organomegaly  Extremities: extremities normal, atraumatic, no cyanosis or edema  Pulses: Mildly diminished 1+ DP pulses bilaterally. Difficult to palpate PT pulses.  Neurologic: Mental status: Alert, oriented, thought content appropriate  Cranial nerves: normal  HEENT: [place]/AT, EOMI, MMM, anicteric sclera  [id number] today: Yes Rate:72 , Rhythm: NSR, Non-specific ST-T abnormalities: otherwise normal  Recent Labs: Reviewed in Epic; TC 184, TG 75, LDL 129*, HDL 40  ASSESSMENT / PLAN: Shortness of breath on exertion Now with relatively normal PFTs - only reduced DLCO (that may need to be evaluated by High Res CT Chest) if no Cardiac Etiology is found.  I am still not convinced that this is not related to CAD - with a reduced EF otherwise unexplained, the  Myoview is not conclusive as balanced ischemia is still a likely possibility in this pt with HTN, HLD, chronic smoking, & FH of premature CAD.   At this point, I think that with persistent exertional dyspnea and the data above, definitive invasive evaluation is the only way to rule out Multivessel CAD.   I would recommend R&LHC, but he(& his wife) are still in the process of securing financial assistance & would like to wait until they hear back after submitting the paperwork.   We will check back with them in the beginning of February & plan to schedule R&LHC at that time.  Cardiomyopathy Mildly reduced EF, but as yet no clear indication of any cause.   On ARB, but with fatigue as one of the symptoms, I will avoid BB for now. Will increase ARB dose for additional afterload reduction.   R&LHC will answer more ?s re: etiology & extent of LVEDP involvement.  This would help with diagnostic & therapeutic plans.  Chest pain with moderate risk for cardiac etiology Not currently having chest pressure like he was prior to initial evaluation, but I cannot be sure if this is simply his avoiding activity that triggered the  symptoms.  Hypertension BP is high to day.  With reduced EF, will increase ARB for additional afterload reduction.  Dyslipidemia, goal LDL below 100 On statin x ~2 months.  Will check Lipid Panel along with Pre-Cath Labs in February.    No orders of the defined types were placed in this encounter.   Meds ordered this encounter  Medications  . losartan (COZAAR) 50 MG tablet    Sig: Take 1 tablet (50 mg total) by mouth daily.    Dispense:  30 tablet    Refill:  11    Followup: 6 weeks  Timarion Agcaoili W. Ellyn Hack, M.D., M.S. [facility] [address] [place], [place]  [address]  [phone] Pager # [phone]

## 2013-07-28 NOTE — Assessment & Plan Note (Signed)
Not currently having chest pressure like he was prior to initial evaluation, but I cannot be sure if this is simply his avoiding activity that triggered the symptoms.

## 2013-07-28 NOTE — Assessment & Plan Note (Signed)
Mildly reduced EF, but as yet no clear indication of any cause.   On ARB, but with fatigue as one of the symptoms, I will avoid BB for now. Will increase ARB dose for additional afterload reduction.   R&LHC will answer more ?s re: etiology & extent of LVEDP involvement.  This would help with diagnostic & therapeutic plans.

## 2013-07-28 NOTE — Patient Instructions (Addendum)
AWAITING FOR INSURANCE - TO SCHEDULE RIGHT AND LEFT HEART CATH IN FEB 2015 NEED LABS AS WELL AS LIPIDS.  PLEASE CALL WITH THE INFORMATION CONCERNING INSURANCE  Increase Losartan 50 mg     Your physician wants you to follow-up in [place].  You will receive a reminder letter in the mail two months in advance. If you don't receive a letter, please call our office to schedule the follow-up appointment.

## 2013-07-29 ENCOUNTER — Other Ambulatory Visit: Payer: Self-pay | Admitting: *Deleted

## 2013-08-14 ENCOUNTER — Telehealth: Payer: Self-pay | Admitting: Cardiology

## 2013-08-14 DIAGNOSIS — I428 Other cardiomyopathies: Secondary | ICD-10-CM

## 2013-08-14 DIAGNOSIS — D689 Coagulation defect, unspecified: Secondary | ICD-10-CM

## 2013-08-14 DIAGNOSIS — Z01818 Encounter for other preprocedural examination: Secondary | ICD-10-CM

## 2013-08-14 DIAGNOSIS — I10 Essential (primary) hypertension: Secondary | ICD-10-CM

## 2013-08-14 DIAGNOSIS — E785 Hyperlipidemia, unspecified: Secondary | ICD-10-CM

## 2013-08-14 DIAGNOSIS — R079 Chest pain, unspecified: Secondary | ICD-10-CM

## 2013-08-14 DIAGNOSIS — R0602 Shortness of breath: Secondary | ICD-10-CM

## 2013-08-14 NOTE — Telephone Encounter (Signed)
Spoke with wife. She states patient will have insurance as of 08/17/13.they are ready to have the cath done. She states the patient has had to use NTG WITH relief.   Will schedule  R and L HEART CATH -- USE RGT RADIAL  Between 08/19/13 to 08/27/13 per Dr Ellyn Hack ORDERS FROM LAST OFFICE VISIT 07/28/2013.  Wife had question about the timeframe of hospital stay. RN answered question and wife verbalized understanding.  Will have scheduler call tomorrow to give date and when to go for labs

## 2013-08-14 NOTE — Telephone Encounter (Signed)
She wanted  You to know he is ready to schedule his Cath.

## 2013-08-14 NOTE — Telephone Encounter (Signed)
Good.   I will look for him.  Leonie Man, MD

## 2013-08-15 ENCOUNTER — Encounter (HOSPITAL_COMMUNITY): Payer: Self-pay | Admitting: Pharmacy Technician

## 2013-08-15 ENCOUNTER — Encounter: Payer: Self-pay | Admitting: Cardiology

## 2013-08-17 DIAGNOSIS — I2510 Atherosclerotic heart disease of native coronary artery without angina pectoris: Secondary | ICD-10-CM

## 2013-08-17 HISTORY — DX: Atherosclerotic heart disease of native coronary artery without angina pectoris: I25.10

## 2013-08-20 LAB — CBC
HEMATOCRIT: 45.4 % (ref 39.0–52.0)
HEMOGLOBIN: 15.7 g/dL (ref 13.0–17.0)
MCH: 29.6 pg (ref 26.0–34.0)
MCHC: 34.6 g/dL (ref 30.0–36.0)
MCV: 85.5 fL (ref 78.0–100.0)
Platelets: 223 10*3/uL (ref 150–400)
RBC: 5.31 MIL/uL (ref 4.22–5.81)
RDW: 13.2 % (ref 11.5–15.5)
WBC: 9.2 10*3/uL (ref 4.0–10.5)

## 2013-08-20 LAB — PROTIME-INR
INR: 0.98 (ref <1.50)
Prothrombin Time: 12.9 seconds (ref 11.6–15.2)

## 2013-08-20 LAB — LIPID PANEL
CHOL/HDL RATIO: 4 ratio
Cholesterol: 198 mg/dL (ref 0–200)
HDL: 49 mg/dL (ref >39)
LDL Cholesterol: 132 mg/dL — ABNORMAL HIGH (ref 0–99)
Triglycerides: 87 mg/dL (ref <150)
VLDL: 17 mg/dL (ref 0–40)

## 2013-08-20 LAB — COMPREHENSIVE METABOLIC PANEL
ALBUMIN: 4.3 g/dL (ref 3.5–5.2)
ALT: 27 U/L (ref 0–53)
AST: 23 U/L (ref 0–37)
Alkaline Phosphatase: 78 U/L (ref 39–117)
BILIRUBIN TOTAL: 0.7 mg/dL (ref 0.2–1.2)
BUN: 24 mg/dL — AB (ref 6–23)
CO2: 23 mEq/L (ref 19–32)
Calcium: 9.1 mg/dL (ref 8.4–10.5)
Chloride: 105 mEq/L (ref 96–112)
Creat: 0.93 mg/dL (ref 0.50–1.35)
Glucose, Bld: 97 mg/dL (ref 70–99)
POTASSIUM: 4.4 meq/L (ref 3.5–5.3)
Sodium: 139 mEq/L (ref 135–145)
Total Protein: 7.2 g/dL (ref 6.0–8.3)

## 2013-08-20 LAB — APTT: APTT: 34 s (ref 24–37)

## 2013-08-20 LAB — TSH: TSH: 2.091 u[IU]/mL (ref 0.350–4.500)

## 2013-08-21 ENCOUNTER — Other Ambulatory Visit: Payer: Self-pay | Admitting: *Deleted

## 2013-08-21 DIAGNOSIS — Z01818 Encounter for other preprocedural examination: Secondary | ICD-10-CM

## 2013-08-25 ENCOUNTER — Telehealth: Payer: Self-pay | Admitting: *Deleted

## 2013-08-25 NOTE — Telephone Encounter (Signed)
Message copied by Raiford Simmonds on Mon Aug 25, 2013  6:07 PM ------      Message from: Leonie Man      Created: Sat Aug 23, 2013  5:22 PM       Chemistries and INR look great for cardiac cath. Lipid panel looks relatively stable, but the HDL is improved. We'll need to continue statin therapy.            Leonie Man, MD       ------

## 2013-08-25 NOTE — Telephone Encounter (Signed)
Spoke to wife. Result given. Verbalized understanding. Patient is a go for cath tomorrow

## 2013-08-26 ENCOUNTER — Ambulatory Visit (HOSPITAL_COMMUNITY)
Admission: RE | Admit: 2013-08-26 | Discharge: 2013-08-26 | Disposition: A | Discharge status: Home / Self Care | Payer: BC Managed Care – PPO | Source: Ambulatory Visit | Attending: Cardiology | Admitting: Cardiology

## 2013-08-26 ENCOUNTER — Encounter (HOSPITAL_COMMUNITY)
Admission: RE | Disposition: A | Discharge status: Home / Self Care | Payer: Self-pay | Source: Ambulatory Visit | Attending: Cardiology

## 2013-08-26 DIAGNOSIS — Z87891 Personal history of nicotine dependence: Secondary | ICD-10-CM | POA: Insufficient documentation

## 2013-08-26 DIAGNOSIS — E785 Hyperlipidemia, unspecified: Secondary | ICD-10-CM | POA: Diagnosis present

## 2013-08-26 DIAGNOSIS — I10 Essential (primary) hypertension: Secondary | ICD-10-CM | POA: Diagnosis present

## 2013-08-26 DIAGNOSIS — Z7982 Long term (current) use of aspirin: Secondary | ICD-10-CM | POA: Insufficient documentation

## 2013-08-26 DIAGNOSIS — E669 Obesity, unspecified: Secondary | ICD-10-CM | POA: Diagnosis present

## 2013-08-26 DIAGNOSIS — I428 Other cardiomyopathies: Secondary | ICD-10-CM | POA: Diagnosis present

## 2013-08-26 DIAGNOSIS — R0602 Shortness of breath: Secondary | ICD-10-CM | POA: Diagnosis present

## 2013-08-26 DIAGNOSIS — Z6830 Body mass index (BMI) 30.0-30.9, adult: Secondary | ICD-10-CM | POA: Insufficient documentation

## 2013-08-26 DIAGNOSIS — Z8249 Family history of ischemic heart disease and other diseases of the circulatory system: Secondary | ICD-10-CM

## 2013-08-26 DIAGNOSIS — Z01818 Encounter for other preprocedural examination: Secondary | ICD-10-CM

## 2013-08-26 DIAGNOSIS — I429 Cardiomyopathy, unspecified: Secondary | ICD-10-CM

## 2013-08-26 DIAGNOSIS — R079 Chest pain, unspecified: Secondary | ICD-10-CM

## 2013-08-26 DIAGNOSIS — I2510 Atherosclerotic heart disease of native coronary artery without angina pectoris: Secondary | ICD-10-CM

## 2013-08-26 HISTORY — PX: LEFT HEART CATHETERIZATION WITH CORONARY ANGIOGRAM: SHX5451

## 2013-08-26 LAB — POCT ACTIVATED CLOTTING TIME: Activated Clotting Time: 160 seconds

## 2013-08-26 SURGERY — LEFT HEART CATHETERIZATION WITH CORONARY ANGIOGRAM
Laterality: Right

## 2013-08-26 MED ORDER — OXYCODONE-ACETAMINOPHEN 5-325 MG PO TABS: 1.0000 | ORAL_TABLET | ORAL | Status: DC | PRN

## 2013-08-26 MED ORDER — HEPARIN SODIUM (PORCINE) 1000 UNIT/ML IJ SOLN
INTRAMUSCULAR | Status: AC
  Filled 2013-08-26: qty 1

## 2013-08-26 MED ORDER — MIDAZOLAM HCL 2 MG/2ML IJ SOLN
INTRAMUSCULAR | Status: AC
  Filled 2013-08-26: qty 2

## 2013-08-26 MED ORDER — NITROGLYCERIN 0.2 MG/ML ON CALL CATH LAB
INTRAVENOUS | Status: AC
  Filled 2013-08-26: qty 1

## 2013-08-26 MED ORDER — FENTANYL CITRATE 0.05 MG/ML IJ SOLN
INTRAMUSCULAR | Status: AC
  Filled 2013-08-26: qty 2

## 2013-08-26 MED ORDER — MORPHINE SULFATE 2 MG/ML IJ SOLN: 2.0000 mg | INTRAMUSCULAR | Status: DC | PRN

## 2013-08-26 MED ORDER — DIAZEPAM 5 MG PO TABS
5.0000 mg | ORAL_TABLET | ORAL | Status: AC
  Administered 2013-08-26: 5 mg via ORAL
  Filled 2013-08-26: qty 1

## 2013-08-26 MED ORDER — ONDANSETRON HCL 4 MG/2ML IJ SOLN
4.0000 mg | Freq: Four times a day (QID) | INTRAMUSCULAR | Status: DC | PRN
Start: 2013-08-26 — End: 2013-08-26

## 2013-08-26 MED ORDER — SODIUM CHLORIDE 0.9 % IV SOLN
INTRAVENOUS | Status: DC
  Administered 2013-08-26: 07:00:00 via INTRAVENOUS

## 2013-08-26 MED ORDER — HEPARIN (PORCINE) IN NACL 2-0.9 UNIT/ML-% IJ SOLN
INTRAMUSCULAR | Status: AC
  Filled 2013-08-26: qty 1500

## 2013-08-26 MED ORDER — OXYCODONE-ACETAMINOPHEN 5-325 MG PO TABS
1.0000 | ORAL_TABLET | ORAL | Status: DC | PRN
  Administered 2013-08-26: 2 via ORAL
  Filled 2013-08-26: qty 2

## 2013-08-26 MED ORDER — SODIUM CHLORIDE 0.9 % IJ SOLN: 3.0000 mL | Freq: Two times a day (BID) | INTRAMUSCULAR | Status: DC

## 2013-08-26 MED ORDER — SODIUM CHLORIDE 0.9 % IV SOLN: 1.0000 mL/kg/h | INTRAVENOUS | Status: DC

## 2013-08-26 MED ORDER — VERAPAMIL HCL 2.5 MG/ML IV SOLN
INTRAVENOUS | Status: AC
Start: 2013-08-26 — End: 2013-08-26
  Filled 2013-08-26: qty 2

## 2013-08-26 MED ORDER — ONDANSETRON HCL 4 MG/2ML IJ SOLN
INTRAMUSCULAR | Status: AC
  Filled 2013-08-26: qty 2

## 2013-08-26 MED ORDER — ACETAMINOPHEN 325 MG PO TABS: 650.0000 mg | ORAL_TABLET | ORAL | Status: DC | PRN

## 2013-08-26 MED ORDER — LIDOCAINE HCL (PF) 1 % IJ SOLN
INTRAMUSCULAR | Status: AC
  Filled 2013-08-26: qty 30

## 2013-08-26 NOTE — Discharge Instructions (Signed)
Out Patient Radial Cath instructions. Radial Site Care Refer to this sheet in the next few weeks. These instructions provide you with information on caring for yourself after your procedure. Your caregiver may also give you more specific instructions. Your treatment has been planned according to current medical practices, but problems sometimes occur. Call your caregiver if you have any problems or questions after your procedure. HOME CARE INSTRUCTIONS  You may shower the day after the procedure.Remove the bandage (dressing) and gently wash the site with plain soap and water.Gently pat the site dry.  Do not apply powder or lotion to the site.  Do not submerge the affected site in water for 3 to 5 days.  Inspect the site at least twice daily.  Do not flex or bend the affected arm for 24 hours.  No lifting over 5 pounds (2.3 kg) for 5 days after your procedure.  Do not drive home if you are discharged the same day of the procedure. Have someone else drive you.  You may drive 24 hours after the procedure unless otherwise instructed by your caregiver.  Do not operate machinery or power tools for 24 hours.  A responsible adult should be with you for the first 24 hours after you arrive home. What to expect:  Any bruising will usually fade within 1 to 2 weeks.  Blood that collects in the tissue (hematoma) may be painful to the touch. It should usually decrease in size and tenderness within 1 to 2 weeks. SEEK IMMEDIATE MEDICAL CARE IF:  You have unusual pain at the radial site.  You have redness, warmth, swelling, or pain at the radial site.  You have drainage (other than a small amount of blood on the dressing).  You have chills.  You have a fever or persistent symptoms for more than 72 hours.  You have a fever and your symptoms suddenly get worse.  Your arm becomes pale, cool, tingly, or numb.  You have heavy bleeding from the site. Hold pressure on the site. Document  Released: 08/05/2010 Document Revised: 09/25/2011 Document Reviewed: 08/05/2010 [facility] Patient Information 2014 [place], [place].

## 2013-08-26 NOTE — CV Procedure (Signed)
CARDIAC CATHETERIZATION REPORT  NAME:  Charles Grimes   MRN: [id number] DOB:  21-Oct-1955   ADMIT DATE: 08/26/2013 Procedure Date: 08/26/2013  INTERVENTIONAL CARDIOLOGIST: Leonie Man, M.D., MS PRIMARY CARE PROVIDER: No PCP Per Patient PRIMARY CARDIOLOGIST: Leonie Man, MD, MS  PATIENT: Charles Grimes is a 58 y.o. obese male with a heavy smoking history, HTN & HLD who was found to have a reduced LVEF by both Myoview & Echocardiogram ordered to evaluated exertional dyspnea & chest pressure. I last saw him on 07/28/2013 & he noted continued DOE, with less pressure after the addition of an ARB for BP control. Since that visit, he has quit smoking, & notes an improvement of overall breathing, but still has DOE. He has used NTG at least 5-6 times over the past few weeks for chest pressure with reliable relief.  After a long discussion during our last encounter, we determined that the most expeditious means to reliably rule out CAD in the setting of reduced LVEF without Myoview evidence of ischemia was to perform LHC/Angio. We also discussed the potential of RHC to evaluate for potential primary pulmonary involvement. He now presents for R&LHC.  PRE-OPERATIVE DIAGNOSIS:   Principal Problem:   Cardiomyopathy Active Problems:   Chest pain with moderate risk for cardiac etiology   Shortness of breath on exertion   Obesity (BMI 30-39.9)   Family history of premature CAD   Hypertension   Dyslipidemia, goal LDL below 100  PROCEDURES PERFORMED:    LEFT HEART CATHETERIZATION WITH CORONARY ANGIOGRAPHY  ABORTED ATTEMPT AT BRACHIAL RIGHT HEART CATH  PROCEDURE:Consent:  Risks of procedure as well as the alternatives and risks of each were explained to the (patient/caregiver).  Consent for procedure obtained. Consent for signed by MD and patient with RN witness -- placed on chart.   PROCEDURE: The patient was brought to the [facility]nd [facility] [place] in the fasting state  and prepped and draped in the usual sterile fashion for Right groin or radial access. A modified Allen's test with plethysmography was performed, revealing excellent Ulnar artery collateral flow.  Sterile technique was used including antiseptics, cap, gloves, gown, hand hygiene, mask and sheet.  Skin prep: Chlorhexidine.  Time Out: Verified patient identification, verified procedure, site/side was marked, verified correct patient position, special equipment/implants available, medications/allergies/relevent history reviewed, required imaging and test results available.  Performed  Access:   RIGHT RADIAL ARTERY: 6 Fr Sheath -- Seldinger technique (Angiocath Micropuncture Kit)  IA Radial Cocktail, IV Heparin  RIGHT AC IV in place -- exchanged over wire for 5 Fr short sheath -- difficult to pass wire  Unable to pass 5 Fr Swan Ganz catheter despite use of Coronary Wires (Whisper & BMS)  Attempr @ RHC aborted  Diagnostic:  TIG 4.0, JR 4, Angled Pigtail catheters advanced & exchanged over long exchange wire  Left Coronary Artery Angiography: TIG 4.0  Right Coronary Artery Angiography: JR4  LV Hemodynamics (LV Gram): Angled Pigtail  TR Band:  0900 Hours, 9 mL air  Hemodynamics:  Central Aortic / Mean Pressures: 109/70 mmHg; 11 mmHg  Left Ventricular Pressures / EDP: 111/6 mmHg; 11 mmHg  Left Ventriculography:  EF: ~45 %  Wall Motion: global hypokinesis  Coronary Anatomy:  Left Main: Large caliber vessel, trifurcates into LAD, Ramus Intermedius & Circumflex LAD: large caliber vessel that wraps the apex; minimal luminal irregularities.  D1: small caliber vessel, minimal luminal irregularities   D2: Moderate caliber vessel, still proximal; minimal luminal irregularities  Left Circumflex: Moderate to large caliber vessel that essentially becomes a single bifurcating Lateral OM.  Mid vessel diffuse 40-50% luminal irregularities; not significant.  Ramus intermedius: Large caliber  vessel (at least as large as LAD) that reaches the apex.  Angiographically normal.   RCA: Large caliber, dominant vessel with 2 major RV marginal branches.  It bifurcates distally into the Right Posterior Descending Artery * the  Right Posterior AV Groove Branch (RPAV); angiographically normal.  RPDA: Moderate caliber vessel; angiographically normal.  RPL Sysytem:The RPAV begins as a moderate caliber vessel that bifurcates into 2 small to moderate caliber Right Posterolateral Branches; angiographically normal.  After reviewing the initial angiography, no obvious culprit lesion was identified.    MEDICATIONS:  Anesthesia:  Local Lidocaine 2 ml  Sedation:  2 mg IV Versed, 75 mcg IV fentanyl ;   Premedication: 5 mg PO Valium  Omnipaque Contrast: 95 ml  Anticoagulation:  IV Heparin 5500 Units Radial Cocktail: 5 mg Verapamil, 400 mcg NTG, 2 ml 2% Lidocaine in 10 ml NS  PATIENT DISPOSITION:    The patient was transferred to the PACU holding area in a hemodynamicaly stable, chest pain free condition.  The patient tolerated the procedure well, and there were no complications besides the aborted brachial RHC -- concern for through-& through access vs. Valve..    EBL:   < 5 ml  The patient was stable before, during, and after the procedure.  Mild upper arm pain - better after repositioning.  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   Leonie Man, M.D., M.S. [facility] [address] [place], [place]  [address]  [phone]  08/26/2013 9:09 AM

## 2013-08-26 NOTE — H&P (Addendum)
History and Physical Interval Note:    NAME:  Charles Grimes   MRN: [id number] DOB:  1956-02-16   ADMIT DATE: 08/26/2013  08/26/2013 6:50 AM  Charles Grimes is a 58 y.o. obese male with a heavy smoking history, HTN & HLD who was found to have a reduced LVEF by both Myoview & Echocardiogram ordered to evaluated exertional dyspnea & chest pressure.  I last saw him on 07/28/2013 & he noted continued DOE, with less pressure after the addition of an ARB for BP control.  Since that visit, he has quit smoking, & notes an improvement of overall breathing, but still has DOE.  He has used NTG at least 5-6 times over the past few weeks for chest pressure with reliable relief.   After a long discussion during our last encounter, we determined that the most expeditious means to reliably rule out CAD in the setting of reduced LVEF without Myoview evidence of ischemia was to perform LHC/Angio.  We also discussed the potential of RHC to evaluate for potential primary pulmonary involvement.  He now presents for R&LHC.  Past Medical History  Diagnosis Date  . Cigarette smoker two packs a day or less     Recently reduced from 2 pack per day to one pack per day  . Skin disorder      recently diagnosed, not sure of diagnosis.  . Hypertension   . Obesity (BMI 30.0-34.9)   . Current smoker     ~1 ppd  . Family history of premature CAD   . Cardiomyopathy 05/2013    As yet unclear Etiology - EF ~45% by Echo & Myoview  . Dyslipidemia    Past Surgical History  Procedure Laterality Date  . Nm myoview ltd  06/03/2013    Low risk study with no ischemia. EF roughly 44% with no regional wall motion abnormalities noted  . Transthoracic echocardiogram  06/11/2013    Mildly reduced EF: 45-50%. Mild anterior hypokinesis with incoordinate septal motion. No evidence of pulmonary hypertension  . Lower extremity arterial dopplers  06/11/2013    No occlusive disease  . Pfts  06/2013    Normal Volumes &  Spirometry;  Moderately reduced DLCO   FAMHx: Family History  Problem Relation Age of Onset  . Atrial fibrillation Mother     Alive at 2  . COPD Mother   . Hypertension Mother   . Hypertension Father     Alive at 10  . Lung cancer Father     Chronic smoker  . Heart attack Maternal Grandmother 48  . Heart failure Paternal Grandmother   . Diabetes Paternal Grandmother   . Heart attack Brother 50    X2  . Heart attack Sister 17  . Factor V Leiden deficiency Father     Also Lupus anticoagulant   SOCHx: Married father of 2 boys. Lives with wife, [name], and one son. 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 After cutting back to 1 ppd from 2 ppd. Only occasional beer and mixed drinks. Does not exercise because he is too tired.  ALLERGIES: Allergies  Allergen Reactions  . Isosorbide Other (See Comments)    CONTINUOUS HEADACHE   . Penicillins Hives and Rash    HOME MEDICATIONS: Prescriptions prior to admission  Medication Sig Dispense Refill  . acetaminophen (TYLENOL) 325 MG tablet Take 650 mg by mouth as needed.      Marland Kitchen  aspirin EC 81 MG tablet Take 81 mg by mouth daily.      Marland Kitchen atorvastatin (LIPITOR) 20 MG tablet Take 1 tablet (20 mg total) by mouth daily.  90 tablet  3  . diphenhydramine-acetaminophen (TYLENOL PM) 25-500 MG TABS Take 1 tablet by mouth at bedtime as needed.      Marland Kitchen losartan (COZAAR) 50 MG tablet Take 1 tablet (50 mg total) by mouth daily.  30 tablet  11  . nitroGLYCERIN (NITROSTAT) 0.4 MG SL tablet Place 1 tablet (0.4 mg total) under the tongue every 5 (five) minutes as needed for chest pain.  30 tablet  0  . omeprazole (PRILOSEC OTC) 20 MG tablet Take 20 mg by mouth daily.      Marland Kitchen triamcinolone cream (KENALOG) 0.5 % Apply 1 application topically 3 (three) times daily.        PHYSICAL EXAM:Blood pressure 137/80, pulse 87, temperature 98.1 F (36.7 C), temperature source Oral, resp.  rate 18, height 5\' 11"  (1.803 m), weight 237 lb (107.502 kg), SpO2 96.00%. General appearance: alert, cooperative, appears stated age, no distress, mildly obese and Well-nourished and well-groomed. Answers questions appropriately.  Neck: no LAN, no carotid bruit, no JVD and supple, symmetrical, trachea midline  Lungs: wheezes bilaterally and Otherwise clear to auscultation. Clear to percussion. Increased AP diameter with prolonged expiratory phase.  Heart: RRR, S1, S2 normal, no M/R/G, and normal apical impulse  Abdomen: soft, non-tender; bowel sounds normal; no masses, no organomegaly  Extremities: extremities normal, atraumatic, no cyanosis or edema  Pulses: Mildly diminished 1+ DP pulses bilaterally. Difficult to palpate PT pulses.  Neurologic: Mental status: Alert, oriented, thought content appropriate  Cranial nerves: normal  HEENT: [place]/AT, EOMI, MMM, anicteric sclera  ECG- NSR 87, normal  IMPRESSION & PLAN The patients' history has been reviewed, patient examined, no change in status from most recent note, stable for surgery. I have reviewed the patients' chart and labs. Questions were answered to the patient's satisfaction.   Charles Grimes has presented today for surgery, with the diagnosis of CARDIOMYOPATHY WITH CHEST PAIN AND DYSPNEA ON EXERTION - EQUIVOCAL RESULTS ON MYOVIEW. Class III Symptoms Principal Problem:   Cardiomyopathy Active Problems:   Chest pain with moderate risk for cardiac etiology   Shortness of breath on exertion   Obesity (BMI 30-39.9)   Family history of premature CAD   Hypertension   Dyslipidemia, goal LDL below 100  The various methods of treatment have been discussed with the patient and family.   Risks / Complications include, but not limited to: Death, MI, CVA/TIA, VF/VT (with defibrillation), Bradycardia (need for temporary pacer placement), contrast induced nephropathy, bleeding / bruising / hematoma / pseudoaneurysm, vascular or coronary injury  (with possible emergent CT or Vascular Surgery), adverse medication reactions, infection.    After consideration of risks, benefits and other options for treatment, the patient has consented to Procedure(s):  LEFT & RIGHT HEART CATHETERIZATION AND CORONARY ANGIOGRAPHY +/- AD [place]  as a surgical intervention.   We will proceed with the planned procedure.  [facility] [address] [place] [place], [place]  [address]  [phone]  08/26/2013 6:50 AM  Cath Lab Visit (complete for each Cath Lab visit)  Clinical Evaluation Leading to the Procedure:   ACS: no  Non-ACS:    Anginal Classification: CCS III  Anti-ischemic medical therapy: Minimal Therapy (1 class of medications)  Non-Invasive Test Results: Equivocal test results  Prior CABG: No previous  CABG

## 2013-09-09 ENCOUNTER — Ambulatory Visit: Payer: Self-pay | Admitting: Cardiology

## 2013-09-16 ENCOUNTER — Ambulatory Visit (INDEPENDENT_AMBULATORY_CARE_PROVIDER_SITE_OTHER): Payer: BC Managed Care – PPO | Admitting: Cardiology

## 2013-09-16 ENCOUNTER — Encounter: Payer: Self-pay | Admitting: Cardiology

## 2013-09-16 VITALS — BP 110/80 | HR 72 | Ht 71.0 in | Wt 255.0 lb

## 2013-09-16 DIAGNOSIS — I428 Other cardiomyopathies: Secondary | ICD-10-CM

## 2013-09-16 DIAGNOSIS — E669 Obesity, unspecified: Secondary | ICD-10-CM

## 2013-09-16 DIAGNOSIS — R079 Chest pain, unspecified: Secondary | ICD-10-CM

## 2013-09-16 DIAGNOSIS — E785 Hyperlipidemia, unspecified: Secondary | ICD-10-CM

## 2013-09-16 DIAGNOSIS — Z87891 Personal history of nicotine dependence: Secondary | ICD-10-CM

## 2013-09-16 DIAGNOSIS — I429 Cardiomyopathy, unspecified: Secondary | ICD-10-CM

## 2013-09-16 DIAGNOSIS — R0602 Shortness of breath: Secondary | ICD-10-CM

## 2013-09-16 MED ORDER — TRIAMCINOLONE ACETONIDE 0.5 % EX CREA: 1.0000 "application " | TOPICAL_CREAM | Freq: Three times a day (TID) | CUTANEOUS | Status: DC

## 2013-09-16 MED ORDER — NITROGLYCERIN 0.4 MG SL SUBL: 0.4000 mg | SUBLINGUAL_TABLET | SUBLINGUAL | Status: DC | PRN

## 2013-09-16 NOTE — Patient Instructions (Signed)
NO  CHANGE TO MEDICATION-  Your physician wants you to follow-up in [place] Ellyn Hack.  You will receive a reminder letter in the mail two months in advance. If you don't receive a letter, please call our office to schedule the follow-up appointment.

## 2013-09-16 NOTE — Progress Notes (Signed)
'@[email or web address]' @  PCP: No PCP Per Patient  Clinic Note: Chief Complaint  Patient presents with  . Follow-up    post cath , bruising  rgt arm, sorenss in rgt bicep, no edema , sob ,-quit smoking 6 weeks ago    HPI: Charles Grimes is a 58 y.o. male with a Cardiovascular Problem List below who presents today for post cath followup. His recall is a very pleasant gentleman who I first met last fall for evaluation of chest discomfort and exertional dyspnea. Interestingly, the chest discomfort was actually always at rest. The dyspnea is only been what has been exertional. Because of a reduced ejection fraction about Myoview and echocardiogram, he underwent cardiac catheterization which did not show any significant CAD. His LVEDP was relatively normal. I abandoned an attempted brachial vein right heart cath.  Interval History: [name] presents today relatively stable. He still has exertional dyspnea but is doing better than before. He said he uses nitroglycerin glycerin couple times since the cath for discomfort, his chest that occurred during rest. He is also notable that the discomfort in his right forearm. He still has about 2-3 pillow orthopnea, no PND or edema. He has successfully been absent of smoking since the last visit here. With that he has increased his by mouth intake is therefore gained about 10 pounds. He has tried to adjust what he eats to make sure that he is not eating poorly. He denies any rapid or irregular heartbeats. No syncope or near syncope, no TIA or RCA symptoms. No claudication symptoms. No melena, hematochezia or hematuria.  Past Medical History  Diagnosis Date  . Cigarette smoker two packs a day or less     Recently reduced from 2 pack per day to one pack per day  . Skin disorder      recently diagnosed, not sure of diagnosis.  . Hypertension   . Obesity (BMI 30.0-34.9)   . Current smoker     ~1 ppd  . Family history of premature CAD   . Cardiomyopathy 05/2013    As yet  unclear Etiology - EF ~45% by Echo & Myoview  . Dyslipidemia     Prior Cardiac Evaluation and Past Surgical History: Past Surgical History  Procedure Laterality Date  . Nm myoview ltd  06/03/2013    Low risk study with no ischemia. EF roughly 44% with no regional wall motion abnormalities noted  . Transthoracic echocardiogram  06/11/2013    Mildly reduced EF: 45-50%. Mild anterior hypokinesis with incoordinate septal motion. No evidence of pulmonary hypertension  . Lower extremity arterial dopplers  06/11/2013    No occlusive disease  . Pfts  06/2013    Normal Volumes &  Spirometry; Moderately reduced DLCO  . Cardiac catheterization  August 26 2013    Mild to moderate disease with 40-50% stenosis and OM1. Otherwise no significant CAD    MEDICATIONS AND ALLERGIES REVIEWED IN EPIC No Change in Social and Family History  ROS: A comprehensive Review of Systems - Negative except Symptoms noted above. Notable non-cardiac symptom is the right arm pain and an aching tired cramping sensation under his left rib cage that occurs from time to time.  PHYSICAL EXAM BP 110/80  Pulse 72  Ht '5\' 11"'  (1.803 m)  Wt 255 lb (115.667 kg)  BMI 35.58 kg/m2 General appearance: alert, cooperative, appears stated age, no distress and moderately obese Neck: no adenopathy, no carotid bruit and no JVD Lungs: clear to auscultation bilaterally, normal percussion bilaterally and non-labored  Heart: regular rate and rhythm, S1, S2 normal, no murmur, click, rub or gallop; nondisplaced PMI Abdomen: soft, non-tender; bowel sounds normal; no masses,  no organomegaly; obese Extremities: extremities normal, atraumatic, no cyanosis or edema  Pulses: 2+ and symmetric Neurologic: Mental status: Alert, oriented, thought content appropriate Cranial nerves: normal (II-XII grossly intact)   FXO:VANVBTYOM today: Yes Rate: 72 , Rhythm: NSR; normal EKG.  Recent Labs 08/20/2013:   TCD 198, TG 87, LDL 132, HDL  49.  Other labs reviewed on Epic, essentially normal.  ASSESSMENT / PLAN: Shortness of breath on exertion Probably multifactorial. There is probably some diastolic dysfunction so she with a mildly reduced EF. He may have microvascular disease but no macrovascular disease noted on cath. Most likely etiology is deconditioning and due to smoking. Hopefully this will clear up some of his time away from smoking his longer. If it doesn't improve in the next followup visit I would consider a high resolution chest CT.  Chest pain at rest Chest pain arrest it is not made worse with exertion and nonobstructive coronary disease would leave potentially coronary spasm as an etiology. He did not tolerate the long-acting nitrate but has been doing well with nitroglycerin. If he is having to use natural she more frequently, will consider using amlodipine  Nonischemic cardiomyopathy - mild He is an ARB. Again not using beta blocker for fear of exacerbating his fatigue. No PND orthopnea or edema to suggest the need for diuretic at this time.  Former heavy cigarette smoker (20-39 per day) I congratulated him on his efforts at smoking cessation. He is now over one month out. He is concerned about the weight gain, but I think we'll have to take this down side and deal with it later.  Obesity (BMI 30-39.9)  Unfortunately his long wait with the smoking cessation. Hopefully craving Will diminish, and his exercise level will increase. On to gradually build up to 0.5 minutes of exercise a day. He can do this initially sets 5 minutes at a time if need be.  Dyslipidemia, goal LDL below 100 Not at goal. He will be rechecked prior to his followup visit. Continue statin for now. May need increased dose versus change to a more potent medication.    Orders Placed This Encounter  Procedures  . EKG 12-Lead   Meds ordered this encounter  Medications  . nitroGLYCERIN (NITROSTAT) 0.4 MG SL tablet    Sig: Place 1 tablet  (0.4 mg total) under the tongue every 5 (five) minutes as needed for chest pain.    Dispense:  25 tablet    Refill:  6  . triamcinolone cream (KENALOG) 0.5 %    Sig: Apply 1 application topically 3 (three) times daily.    Dispense:  30 g    Refill:  3    Followup: 6 months  DAVID W. Ellyn Hack, M.D., M.S. Interventional Cardiologist [facility]

## 2013-09-17 ENCOUNTER — Encounter: Payer: Self-pay | Admitting: Cardiology

## 2013-09-17 NOTE — Assessment & Plan Note (Signed)
Probably multifactorial. There is probably some diastolic dysfunction so she with a mildly reduced EF. He may have microvascular disease but no macrovascular disease noted on cath. Most likely etiology is deconditioning and due to smoking. Hopefully this will clear up some of his time away from smoking his longer. If it doesn't improve in the next followup visit I would consider a high resolution chest CT.

## 2013-09-18 DIAGNOSIS — R079 Chest pain, unspecified: Secondary | ICD-10-CM | POA: Insufficient documentation

## 2013-09-18 DIAGNOSIS — Z87891 Personal history of nicotine dependence: Secondary | ICD-10-CM | POA: Insufficient documentation

## 2013-09-18 NOTE — Assessment & Plan Note (Signed)
He is an ARB. Again not using beta blocker for fear of exacerbating his fatigue. No PND orthopnea or edema to suggest the need for diuretic at this time.

## 2013-09-18 NOTE — Assessment & Plan Note (Signed)
Not at goal. He will be rechecked prior to his followup visit. Continue statin for now. May need increased dose versus change to a more potent medication.

## 2013-09-18 NOTE — Assessment & Plan Note (Signed)
Unfortunately his long wait with the smoking cessation. Hopefully craving Will diminish, and his exercise level will increase. On to gradually build up to 0.5 minutes of exercise a day. He can do this initially sets 5 minutes at a time if need be.

## 2013-09-18 NOTE — Assessment & Plan Note (Signed)
Chest pain arrest it is not made worse with exertion and nonobstructive coronary disease would leave potentially coronary spasm as an etiology. He did not tolerate the long-acting nitrate but has been doing well with nitroglycerin. If he is having to use natural she more frequently, will consider using amlodipine

## 2013-09-18 NOTE — Assessment & Plan Note (Signed)
I congratulated him on his efforts at smoking cessation. He is now over one month out. He is concerned about the weight gain, but I think we'll have to take this down side and deal with it later.

## 2013-09-23 ENCOUNTER — Emergency Department (HOSPITAL_BASED_OUTPATIENT_CLINIC_OR_DEPARTMENT_OTHER)
Admission: EM | Admit: 2013-09-23 | Discharge: 2013-09-23 | Disposition: A | Discharge status: Home / Self Care | Payer: BC Managed Care – PPO | Attending: Emergency Medicine | Admitting: Emergency Medicine

## 2013-09-23 ENCOUNTER — Encounter (HOSPITAL_BASED_OUTPATIENT_CLINIC_OR_DEPARTMENT_OTHER): Payer: Self-pay | Admitting: Emergency Medicine

## 2013-09-23 ENCOUNTER — Emergency Department (HOSPITAL_BASED_OUTPATIENT_CLINIC_OR_DEPARTMENT_OTHER): Payer: BC Managed Care – PPO

## 2013-09-23 DIAGNOSIS — Z79899 Other long term (current) drug therapy: Secondary | ICD-10-CM | POA: Insufficient documentation

## 2013-09-23 DIAGNOSIS — S3981XA Other specified injuries of abdomen, initial encounter: Secondary | ICD-10-CM | POA: Insufficient documentation

## 2013-09-23 DIAGNOSIS — Z87891 Personal history of nicotine dependence: Secondary | ICD-10-CM | POA: Insufficient documentation

## 2013-09-23 DIAGNOSIS — I2510 Atherosclerotic heart disease of native coronary artery without angina pectoris: Secondary | ICD-10-CM | POA: Insufficient documentation

## 2013-09-23 DIAGNOSIS — Y9289 Other specified places as the place of occurrence of the external cause: Secondary | ICD-10-CM | POA: Insufficient documentation

## 2013-09-23 DIAGNOSIS — Z7982 Long term (current) use of aspirin: Secondary | ICD-10-CM | POA: Insufficient documentation

## 2013-09-23 DIAGNOSIS — X500XXA Overexertion from strenuous movement or load, initial encounter: Secondary | ICD-10-CM | POA: Insufficient documentation

## 2013-09-23 DIAGNOSIS — I10 Essential (primary) hypertension: Secondary | ICD-10-CM | POA: Insufficient documentation

## 2013-09-23 DIAGNOSIS — E875 Hyperkalemia: Secondary | ICD-10-CM | POA: Insufficient documentation

## 2013-09-23 DIAGNOSIS — Y9389 Activity, other specified: Secondary | ICD-10-CM | POA: Insufficient documentation

## 2013-09-23 DIAGNOSIS — E860 Dehydration: Secondary | ICD-10-CM | POA: Insufficient documentation

## 2013-09-23 DIAGNOSIS — S73101A Unspecified sprain of right hip, initial encounter: Secondary | ICD-10-CM

## 2013-09-23 DIAGNOSIS — IMO0002 Reserved for concepts with insufficient information to code with codable children: Secondary | ICD-10-CM | POA: Insufficient documentation

## 2013-09-23 DIAGNOSIS — Z9889 Other specified postprocedural states: Secondary | ICD-10-CM | POA: Insufficient documentation

## 2013-09-23 DIAGNOSIS — Y990 Civilian activity done for income or pay: Secondary | ICD-10-CM | POA: Insufficient documentation

## 2013-09-23 DIAGNOSIS — Z880 Allergy status to penicillin: Secondary | ICD-10-CM | POA: Insufficient documentation

## 2013-09-23 DIAGNOSIS — E785 Hyperlipidemia, unspecified: Secondary | ICD-10-CM | POA: Insufficient documentation

## 2013-09-23 DIAGNOSIS — L989 Disorder of the skin and subcutaneous tissue, unspecified: Secondary | ICD-10-CM | POA: Insufficient documentation

## 2013-09-23 DIAGNOSIS — E669 Obesity, unspecified: Secondary | ICD-10-CM | POA: Insufficient documentation

## 2013-09-23 LAB — BASIC METABOLIC PANEL
BUN: 33 mg/dL — ABNORMAL HIGH (ref 6–23)
CALCIUM: 9.9 mg/dL (ref 8.4–10.5)
CO2: 27 meq/L (ref 19–32)
CREATININE: 1.2 mg/dL (ref 0.50–1.35)
Chloride: 99 mEq/L (ref 96–112)
GFR calc Af Amer: 76 mL/min — ABNORMAL LOW (ref >90)
GFR, EST NON AFRICAN AMERICAN: 65 mL/min — AB (ref >90)
Glucose, Bld: 109 mg/dL — ABNORMAL HIGH (ref 70–99)
Potassium: 3.8 mEq/L (ref 3.7–5.3)
Sodium: 141 mEq/L (ref 137–147)

## 2013-09-23 MED ORDER — HYDROCODONE-ACETAMINOPHEN 5-325 MG PO TABS
2.0000 | ORAL_TABLET | Freq: Once | ORAL | Status: AC
  Administered 2013-09-23: 2 via ORAL
  Filled 2013-09-23: qty 2

## 2013-09-23 MED ORDER — SODIUM CHLORIDE 0.9 % IV SOLN
Freq: Once | INTRAVENOUS | Status: AC
  Administered 2013-09-23: 1000 mL via INTRAVENOUS

## 2013-09-23 MED ORDER — POTASSIUM CHLORIDE CRYS ER 20 MEQ PO TBCR
40.0000 meq | EXTENDED_RELEASE_TABLET | Freq: Once | ORAL | Status: AC
Start: 2013-09-23 — End: 2013-09-23
  Administered 2013-09-23: 40 meq via ORAL
  Filled 2013-09-23: qty 2

## 2013-09-23 MED ORDER — HYDROCODONE-ACETAMINOPHEN 5-325 MG PO TABS
2.0000 | ORAL_TABLET | ORAL | Status: DC | PRN
Start: 2013-09-23 — End: 2014-02-04

## 2013-09-23 NOTE — ED Provider Notes (Signed)
Medical screening examination/treatment/procedure(s) were performed by non-physician practitioner and as supervising physician I was immediately available for consultation/collaboration.   EKG Interpretation None        Osvaldo Shipper, MD 09/23/13 [phone]

## 2013-09-23 NOTE — ED Provider Notes (Signed)
CSN: [id number]     Arrival date & time 09/23/13  1600 History   First MD Initiated Contact with Patient 09/23/13 1621     Chief Complaint  Patient presents with  . Abdominal Cramping     (Consider location/radiation/quality/duration/timing/severity/associated sxs/prior Treatment) Patient is a 58 y.o. male presenting with hip pain. The history is provided by the patient. No language interpreter was used.  Hip Pain This is a new problem. The current episode started yesterday. The problem occurs constantly. The problem has been gradually worsening. Associated symptoms include arthralgias. Nothing aggravates the symptoms. He has tried nothing for the symptoms. The treatment provided moderate relief.   Pt twisted his hip yesterday at work.  Pt reports he took 2 40 mg lasix last night because he thought they were tylnol.  Pt complains of having muscle cramps.   Past Medical History  Diagnosis Date  . Cigarette smoker two packs a day or less     Recently reduced from 2 pack per day to one pack per day  . Skin disorder      recently diagnosed, not sure of diagnosis.  . Hypertension   . Obesity (BMI 30.0-34.9)   . Current smoker     ~1 ppd  . Family history of premature CAD   . Cardiomyopathy 05/2013    As yet unclear Etiology - EF ~45% by Echo & Myoview  . Dyslipidemia   . Coronary artery disease    Past Surgical History  Procedure Laterality Date  . Nm myoview ltd  06/03/2013    Low risk study with no ischemia. EF roughly 44% with no regional wall motion abnormalities noted  . Transthoracic echocardiogram  06/11/2013    Mildly reduced EF: 45-50%. Mild anterior hypokinesis with incoordinate septal motion. No evidence of pulmonary hypertension  . Lower extremity arterial dopplers  06/11/2013    No occlusive disease  . Pfts  06/2013    Normal Volumes &  Spirometry; Moderately reduced DLCO  . Cardiac catheterization  August 26 2013    Mild to moderate disease with 40-50% stenosis  and OM1. Otherwise no significant CAD   Family History  Problem Relation Age of Onset  . Atrial fibrillation Mother     Alive at 37  . COPD Mother   . Hypertension Mother   . Hypertension Father     Alive at 55  . Lung cancer Father     Chronic smoker  . Heart attack Maternal Grandmother 48  . Heart failure Paternal Grandmother   . Diabetes Paternal Grandmother   . Heart attack Brother 53    X2  . Heart attack Sister 35  . Factor V Leiden deficiency Father     Also Lupus anticoagulant   History  Substance Use Topics  . Smoking status: Former Smoker -- 1.00 packs/day    Types: Cigarettes    Quit date: 08/11/2013  . Smokeless tobacco: Not on file  . Alcohol Use: Yes    Review of Systems  Musculoskeletal: Positive for arthralgias.  Skin: Positive for wound.  All other systems reviewed and are negative.      Allergies  Isosorbide and Penicillins  Home Medications   Current Outpatient Rx  Name  Route  Sig  Dispense  Refill  . acetaminophen (TYLENOL) 325 MG tablet   Oral   Take 650 mg by mouth as needed.         Marland Kitchen aspirin EC 81 MG tablet   Oral   Take 81 mg  by mouth daily.         Marland Kitchen atorvastatin (LIPITOR) 20 MG tablet   Oral   Take 1 tablet (20 mg total) by mouth daily.   90 tablet   3   . diphenhydramine-acetaminophen (TYLENOL PM) 25-500 MG TABS   Oral   Take 1 tablet by mouth at bedtime as needed.         Marland Kitchen losartan (COZAAR) 50 MG tablet   Oral   Take 1 tablet (50 mg total) by mouth daily.   30 tablet   11   . nitroGLYCERIN (NITROSTAT) 0.4 MG SL tablet   Sublingual   Place 1 tablet (0.4 mg total) under the tongue every 5 (five) minutes as needed for chest pain.   25 tablet   6   . omeprazole (PRILOSEC OTC) 20 MG tablet   Oral   Take 20 mg by mouth daily.         Marland Kitchen triamcinolone cream (KENALOG) 0.5 %   Topical   Apply 1 application topically 3 (three) times daily.   30 g   3    BP 135/79  Pulse 103  Temp(Src) 99.1 F (37.3  C) (Oral)  Resp 20  Ht 5\' 11"  (1.803 m)  Wt 243 lb (110.224 kg)  BMI 33.91 kg/m2  SpO2 98% Physical Exam  Nursing note and vitals reviewed. Constitutional: He is oriented to person, place, and time. He appears well-developed and well-nourished.  HENT:  Head: Normocephalic and atraumatic.  Eyes: Pupils are equal, round, and reactive to light.  Neck: Normal range of motion.  Cardiovascular: Normal rate.   Pulmonary/Chest: Effort normal.  Abdominal: Soft.  Musculoskeletal:  Tender right hip,  From with pain.   Neurological: He is alert and oriented to person, place, and time. He has normal reflexes.  Psychiatric: He has a normal mood and affect.    ED Course  Procedures (including critical care time) Labs Review Labs Reviewed  BASIC METABOLIC PANEL - Abnormal; Notable for the following:    Glucose, Bld 109 (*)    BUN 33 (*)    GFR calc non Af Amer 65 (*)    GFR calc Af Amer 76 (*)    All other components within normal limits   Imaging Review Dg Hip Complete Right  09/23/2013   CLINICAL DATA Right hip pain status post fall  EXAM RIGHT HIP - COMPLETE 2+ VIEW  COMPARISON None.  FINDINGS The bony pelvis appears adequately mineralized for age. The hip joint spaces appear preserved bilaterally. No fracture of the right hemipelvis is demonstrated. AP and frog-leg lateral views of the right hip reveal no acute fracture nor dislocation nor significant degenerative change.  IMPRESSION There is no acute bony abnormality of the right hip.  SIGNATURE  Electronically Signed   By: David  Martinique   On: 09/23/2013 17:12     EKG Interpretation None      MDM   Final diagnoses:  Dehydration  Sprain of right hip    Pt given Iv fluids x 1 liter,   BMet Bun elevated  K is 3.8.   Pt given 2 hydrocodone for hip pain.  Pt advised tofollow up with Dr. Barbaraann Barthel for recheck of hip.   Rx for hydrocodone.  Pt given po potasium here.  Pt encouraged to drink fluids    [place], [place] 09/23/13  1601

## 2013-09-23 NOTE — ED Notes (Signed)
Patient transported to X-ray via stretcher per tech. 

## 2013-09-23 NOTE — ED Notes (Signed)
PA at bedside.

## 2013-09-23 NOTE — ED Notes (Signed)
Pt reports took wife's lasix 80mg  po las tonight in error-c/o cramping all over

## 2013-09-23 NOTE — Discharge Instructions (Signed)
Dehydration, Adult Dehydration means your body does not have as much fluid as it needs. Your kidneys, brain, and heart will not work properly without the right amount of fluids and salt.  HOME CARE  Ask your doctor how to replace body fluid losses (rehydrate).  Drink enough fluids to keep your pee (urine) clear or pale yellow.  Drink small amounts of fluids often if you feel sick to your stomach (nauseous) or throw up (vomit).  Eat like you normally do.  Avoid:  Foods or drinks high in sugar.  Bubbly (carbonated) drinks.  Juice.  Very hot or cold fluids.  Drinks with caffeine.  Fatty, greasy foods.  Alcohol.  Tobacco.  Eating too much.  Gelatin desserts.  Wash your hands to avoid spreading germs (bacteria, viruses).  Only take medicine as told by your doctor.  Keep all doctor visits as told. GET HELP RIGHT AWAY IF:   You cannot drink something without throwing up.  You get worse even with treatment.  Your vomit has blood in it or looks greenish.  Your poop (stool) has blood in it or looks black and tarry.  You have not peed in 6 to 8 hours.  You pee a small amount of very dark pee.  You have a fever.  You pass out (faint).  You have belly (abdominal) pain that gets worse or stays in one spot (localizes).  You have a rash, stiff neck, or bad headache.  You get easily annoyed, sleepy, or are hard to wake up.  You feel weak, dizzy, or very thirsty. MAKE SURE YOU:   Understand these instructions.  Will watch your condition.  Will get help right away if you are not doing well or get worse. Document Released: 04/29/2009 Document Revised: 09/25/2011 Document Reviewed: 02/20/2011 [facility] Patient Information 2014 [place], [place]. Sprain A sprain is a tear in one of the strong, fibrous tissues that connect your bones (ligaments). The severity of the sprain depends on how much of the ligament is torn. The tear can be either partial or complete. CAUSES    Often, sprains are a result of a fall or an injury. The force of the impact causes the fibers of your ligament to stretch beyond their normal length. This excess tension causes the fibers of your ligament to tear. SYMPTOMS  You may have some loss of motion or increased pain within your normal range of motion. Other symptoms include:  Bruising.  Tenderness.  Swelling. DIAGNOSIS  In order to diagnose a sprain, your caregiver will physically examine you to determine how torn the ligament is. Your caregiver may also suggest an X-ray exam to make sure no bones are broken. TREATMENT  If your ligament is only partially torn, treatment usually involves keeping the injured area in a fixed position (immobilization) for a short period. To do this, your caregiver will apply a bandage, cast, or splint to keep the area from moving until it heals. For a partially torn ligament, the healing process usually takes 2 to 3 weeks. If your ligament is completely torn, you may need surgery to reconnect the ligament to the bone or to reconstruct the ligament. After surgery, a cast or splint may be applied and will need to stay on for 4 to 6 weeks while your ligament heals. HOME CARE INSTRUCTIONS  Keep the injured area elevated to decrease swelling.  To ease pain and swelling, apply ice to your joint twice a day, for 2 to 3 days.  Put ice in a  plastic bag.  Place a towel between your skin and the bag.  Leave the ice on for 15 minutes.  Only take over-the-counter or prescription medicine for pain as directed by your caregiver.  Do not leave the injured area unprotected until pain and stiffness go away (usually 3 to 4 weeks).  Do not allow your cast or splint to get wet. Cover your cast or splint with a plastic bag when you shower or bathe. Do not swim.  Your caregiver may suggest exercises for you to do during your recovery to prevent or limit permanent stiffness. SEEK IMMEDIATE MEDICAL CARE IF:  Your  cast or splint becomes damaged.  Your pain becomes worse. MAKE SURE YOU:  Understand these instructions.  Will watch your condition.  Will get help right away if you are not doing well or get worse. Document Released: 06/30/2000 Document Revised: 09/25/2011 Document Reviewed: 07/15/2011 [facility] Patient Information 2014 [place], [place].

## 2014-01-01 ENCOUNTER — Encounter: Payer: Self-pay | Admitting: Physician Assistant

## 2014-01-01 ENCOUNTER — Ambulatory Visit (INDEPENDENT_AMBULATORY_CARE_PROVIDER_SITE_OTHER): Payer: BC Managed Care – PPO | Admitting: Physician Assistant

## 2014-01-01 VITALS — BP 104/74 | HR 89 | Temp 98.0°F | Resp 16 | Ht 71.0 in | Wt 248.5 lb

## 2014-01-01 DIAGNOSIS — Z23 Encounter for immunization: Secondary | ICD-10-CM

## 2014-01-01 DIAGNOSIS — Z1211 Encounter for screening for malignant neoplasm of colon: Secondary | ICD-10-CM

## 2014-01-01 DIAGNOSIS — F419 Anxiety disorder, unspecified: Secondary | ICD-10-CM

## 2014-01-01 DIAGNOSIS — F411 Generalized anxiety disorder: Secondary | ICD-10-CM

## 2014-01-01 DIAGNOSIS — E785 Hyperlipidemia, unspecified: Secondary | ICD-10-CM

## 2014-01-01 DIAGNOSIS — I10 Essential (primary) hypertension: Secondary | ICD-10-CM

## 2014-01-01 MED ORDER — DIAZEPAM 2 MG PO TABS: 2.0000 mg | ORAL_TABLET | Freq: Four times a day (QID) | ORAL | Status: DC | PRN

## 2014-01-01 MED ORDER — DIAZEPAM 2 MG PO TABS: 2.0000 mg | ORAL_TABLET | Freq: Every day | ORAL | Status: DC

## 2014-01-01 MED ORDER — PRAVASTATIN SODIUM 20 MG PO TABS: 20.0000 mg | ORAL_TABLET | Freq: Every day | ORAL | Status: DC

## 2014-01-01 MED ORDER — SERTRALINE HCL 50 MG PO TABS: 25.0000 mg | ORAL_TABLET | Freq: Every day | ORAL | Status: DC

## 2014-01-01 NOTE — Patient Instructions (Signed)
For anxiety -- Take 1/2 tablet of zoloft daily.  Take a valium at bedtime.  This will help with sleep, anxiety and muscle spasms.  If you develop any side effects discussed at visit, please call the office.  For plantar fascitis -- get some good supportive shoes and orthotics.  Apply a cold can to the soles of your feet and roll back and forth.  Use tylenol for pain.  If symptoms persist, you will need to see a Sports Medicine Physician.   For cholesterol -- please take PRavachol daily.  Let me know if you develop any muscle aches.  Follow-up in 1 month.  Ejercicios para perder peso (Exercise to Lose Weight) La actividad fsica y Ardelia Mems dieta saludable ayudan a perder peso. El mdico podr sugerirle ejercicios especficos. IDEAS Y CONSEJOS PARA HACER EJERCICIOS  Elija opciones econmicas que disfrute hacer , como caminar, andar en bicicleta o los vdeos para ejercitarse.  Utilice las Clinical cytogeneticist del ascensor.  Camine durante la hora del almuerzo.  Estacione el auto lejos del lugar de [place] o [place].  Concurra a un gimnasio o tome clases de gimnasia.  Comience con 5  10 minutos de actividad fsica por da. Ejercite hasta 30 minutos, 4 a 6 das por semana.  Utilice zapatos que tengan un buen soporte y ropas cmodas.  Elongue antes y despus de Chief Technology Officer.  Ejercite hasta que aumente la respiracin y el corazn palpite rpido.  Beba agua extra cuando ejercite.  No haga ejercicio Contractor, sentirse mareado o que le falte mucho el aire. La actividad fsica puede quemar alrededor de 150 caloras.  Correr 20 cuadras en 15 minutos.  Jugar vley durante 45 a 60 minutos.  Limpiar y encerar el auto durante 45 a 60 minutos.  Jugar ftbol americano de toque.  Caminar 25 cuadras en 35 minutos.  Empujar un cochecito 20 cuadras en 30 minutos.  Jugar baloncesto durante 30 minutos.  Rastrillar hojas secas durante 30 minutos.  Andar en bicicleta 80 cuadras en 30  minutos.  Caminar 30 cuadras en 30 minutos.  Bailar durante 30 minutos.  Quitar la nieve con una pala durante 15 minutos.  Nadar vigorosamente durante 20 minutos.  Subir escaleras durante 15 minutos.  Andar en bicicleta 60 cuadras durante 15 minutos.  Arreglar el jardn entre 30 y 55 minutos.  Saltar a la soga durante 15 minutos.  Limpiar vidrios o pisos durante 45 a 60 minutos. Document Released: 10/07/2010 Document Revised: 09/25/2011 [facility] Patient Information 2015 [place]. This information is not intended to replace advice given to you by your health care provider. Make sure you discuss any questions you have with your health care provider.

## 2014-01-01 NOTE — Progress Notes (Signed)
Pre visit review using our clinic review tool, if applicable. No additional management support is needed unless otherwise documented below in the visit note/SLS  

## 2014-01-01 NOTE — Progress Notes (Signed)
Patient presents to clinic today to establish care.  Acute Concerns: Patient complains of significant anxiety stemming from family stressors.  Patient's father is suffering from a terminal illness.  Patient is having a very hard time with this.  Endorses some depressed mood but denies anhedonia. Denies SI/HI.  Denies panic attack.  Chronic Issues: Hypertension -- Currently controlled with Losartan.  Denies chest pain, palpitations, lightheadedness or dizziness.  Is followed by Cardiology. Has Rx for Nitro for angina.  Dyslipidemia -- Currently on Pravachol.  Denies myalgias.   GERD -- Takes occasional Prilosec with relief of symptoms.  Health Maintenance: Dental -- up-to-date Vision -- up-to-date Immunizations -- Unsure of Tetanus vaccine.  Colonoscopy --  Never had.  Will need referral.   Past Medical History  Diagnosis Date  . Skin disorder      recently diagnosed, hands, resolved  . Hypertension   . Obesity (BMI 30.0-34.9)   . Family history of premature CAD   . Cardiomyopathy 05/2013    As yet unclear Etiology - EF ~45% by Echo & Myoview  . Dyslipidemia   . Coronary artery disease     Past Surgical History  Procedure Laterality Date  . Nm myoview ltd  06/03/2013    Low risk study with no ischemia. EF roughly 44% with no regional wall motion abnormalities noted  . Transthoracic echocardiogram  06/11/2013    Mildly reduced EF: 45-50%. Mild anterior hypokinesis with incoordinate septal motion. No evidence of pulmonary hypertension  . Lower extremity arterial dopplers  06/11/2013    No occlusive disease  . Pfts  06/2013    Normal Volumes &  Spirometry; Moderately reduced DLCO  . Cardiac catheterization  August 26 2013    Mild to moderate disease with 40-50% stenosis and OM1. Otherwise no significant CAD    Current Outpatient Prescriptions on File Prior to Visit  Medication Sig Dispense Refill  . acetaminophen (TYLENOL) 325 MG tablet Take 650 mg by mouth as needed.       Marland Kitchen aspirin EC 81 MG tablet Take 81 mg by mouth daily.      . diphenhydramine-acetaminophen (TYLENOL PM) 25-500 MG TABS Take 1 tablet by mouth at bedtime as needed.      Marland Kitchen HYDROcodone-acetaminophen (NORCO/VICODIN) 5-325 MG per tablet Take 2 tablets by mouth every 4 (four) hours as needed.  10 tablet  0  . losartan (COZAAR) 50 MG tablet Take 1 tablet (50 mg total) by mouth daily.  30 tablet  11  . nitroGLYCERIN (NITROSTAT) 0.4 MG SL tablet Place 1 tablet (0.4 mg total) under the tongue every 5 (five) minutes as needed for chest pain.  25 tablet  6  . omeprazole (PRILOSEC OTC) 20 MG tablet Take 20 mg by mouth daily.      Marland Kitchen triamcinolone cream (KENALOG) 0.5 % Apply 1 application topically 3 (three) times daily.  30 g  3   No current facility-administered medications on file prior to visit.    Allergies  Allergen Reactions  . Isosorbide Other (See Comments)    CONTINUOUS HEADACHE   . Penicillins Hives and Rash    Family History  Problem Relation Age of Onset  . Atrial fibrillation Mother     Alive at 101  . COPD Mother   . Hypertension Mother   . Hypertension Father     Alive at 5  . Lung cancer Father     Chronic smoker  . Heart attack Maternal Grandmother 48  . Heart failure Paternal Grandmother   .  Diabetes Paternal Grandmother   . Heart attack Brother 75    X2  . Heart attack Sister 92    Deceased  . Factor V Leiden deficiency Father     Also Lupus anticoagulant  . Healthy Sister     x2  . Healthy Son     x2    History   Social History  . Marital Status: Married    Spouse Name: N/A    Number of Children: N/A  . Years of Education: N/A   Occupational History  . Not on file.   Social History Main Topics  . Smoking status: Former Smoker -- 1.00 packs/day    Types: Cigarettes    Quit date: 08/11/2013  . Smokeless tobacco: Not on file  . Alcohol Use: Yes  . Drug Use: No  . Sexual Activity: Not on file   Other Topics Concern  . Not on file   Social History  Narrative   Married father of 2 boys. Lives with wife, [name], and one son.   He works as a Designer, television/film set for [employer] ([employer]   He currently smokes one pack a day, cut down from 2 packs per day.   Only occasional beer and mixed drinks.   Does not exercise because he is too tired.   ROS See HPI.  All other ROS are negative.  BP 104/74  Pulse 89  Temp(Src) 98 F (36.7 C) (Oral)  Resp 16  Ht 5\' 11"  (1.803 m)  Wt 248 lb 8 oz (112.719 kg)  BMI 34.67 kg/m2  SpO2 97%  Physical Exam  Vitals reviewed. Constitutional: He is oriented to person, place, and time and well-developed, well-nourished, and in no distress.  HENT:  Head: Normocephalic and atraumatic.  Right Ear: External ear normal.  Left Ear: External ear normal.  Nose: Nose normal.  Mouth/Throat: Oropharynx is clear and moist. No oropharyngeal exudate.  TM within normal limits bilaterally.   Eyes: Conjunctivae are normal. Pupils are equal, round, and reactive to light.  Neck: Neck supple.  Cardiovascular: Normal rate, regular rhythm, normal heart sounds and intact distal pulses.   Pulmonary/Chest: Effort normal and breath sounds normal. No respiratory distress. He has no wheezes. He has no rales. He exhibits no tenderness.  Lymphadenopathy:    He has no cervical adenopathy.  Neurological: He is alert and oriented to person, place, and time.  Skin: Skin is warm and dry. No rash noted.  Psychiatric: Affect normal.   Assessment/Plan: Hypertension Continue current regimen.  Encouraged DASH diet.  Dyslipidemia, goal LDL below 100 Continue current regimen.  Will obtain lipid panel at [facility].  Anxiety Rx Sertraline.  Rx Diazepam.  Encouraged yoga, mediation or counseling services.  Follow-up 1 month.  Need for prophylactic vaccination with combined diphtheria-tetanus-pertussis (DTP) vaccine Immunization given by nursing staff.  Colon cancer screening Referral placed to GI for  screening colonoscopy.

## 2014-01-11 DIAGNOSIS — F419 Anxiety disorder, unspecified: Secondary | ICD-10-CM

## 2014-01-11 DIAGNOSIS — Z1211 Encounter for screening for malignant neoplasm of colon: Secondary | ICD-10-CM | POA: Insufficient documentation

## 2014-01-11 DIAGNOSIS — Z23 Encounter for immunization: Secondary | ICD-10-CM | POA: Insufficient documentation

## 2014-01-11 DIAGNOSIS — F32A Depression, unspecified: Secondary | ICD-10-CM | POA: Insufficient documentation

## 2014-01-11 DIAGNOSIS — F329 Major depressive disorder, single episode, unspecified: Secondary | ICD-10-CM | POA: Insufficient documentation

## 2014-01-11 NOTE — Assessment & Plan Note (Signed)
Immunization given by nursing staff. 

## 2014-01-11 NOTE — Assessment & Plan Note (Signed)
Referral placed to GI for screening colonoscopy 

## 2014-01-11 NOTE — Assessment & Plan Note (Signed)
Continue current regimen.  Encouraged DASH diet.

## 2014-01-11 NOTE — Assessment & Plan Note (Signed)
Continue current regimen.  Will obtain lipid panel at CPE.

## 2014-01-11 NOTE — Assessment & Plan Note (Signed)
Rx Sertraline.  Rx Diazepam.  Encouraged yoga, mediation or counseling services.  Follow-up 1 month.

## 2014-02-04 ENCOUNTER — Ambulatory Visit (INDEPENDENT_AMBULATORY_CARE_PROVIDER_SITE_OTHER): Payer: BC Managed Care – PPO | Admitting: Physician Assistant

## 2014-02-04 ENCOUNTER — Telehealth: Payer: Self-pay | Admitting: Physician Assistant

## 2014-02-04 ENCOUNTER — Encounter: Payer: Self-pay | Admitting: Physician Assistant

## 2014-02-04 VITALS — BP 106/78 | HR 68 | Temp 97.8°F | Resp 16 | Ht 71.0 in | Wt 241.8 lb

## 2014-02-04 DIAGNOSIS — E785 Hyperlipidemia, unspecified: Secondary | ICD-10-CM

## 2014-02-04 DIAGNOSIS — F419 Anxiety disorder, unspecified: Secondary | ICD-10-CM

## 2014-02-04 DIAGNOSIS — I10 Essential (primary) hypertension: Secondary | ICD-10-CM

## 2014-02-04 DIAGNOSIS — F411 Generalized anxiety disorder: Secondary | ICD-10-CM

## 2014-02-04 LAB — LIPID PANEL
CHOL/HDL RATIO: 4.2 ratio
CHOLESTEROL: 157 mg/dL (ref 0–200)
HDL: 37 mg/dL — ABNORMAL LOW (ref >39)
LDL Cholesterol: 98 mg/dL (ref 0–99)
Triglycerides: 112 mg/dL (ref <150)
VLDL: 22 mg/dL (ref 0–40)

## 2014-02-04 LAB — BASIC METABOLIC PANEL
BUN: 16 mg/dL (ref 6–23)
CALCIUM: 8.7 mg/dL (ref 8.4–10.5)
CO2: 24 mEq/L (ref 19–32)
Chloride: 108 mEq/L (ref 96–112)
Creat: 0.89 mg/dL (ref 0.50–1.35)
GLUCOSE: 95 mg/dL (ref 70–99)
Potassium: 4.3 mEq/L (ref 3.5–5.3)
SODIUM: 140 meq/L (ref 135–145)

## 2014-02-04 LAB — HEPATIC FUNCTION PANEL
ALT: 21 U/L (ref 0–53)
AST: 19 U/L (ref 0–37)
Albumin: 3.8 g/dL (ref 3.5–5.2)
Alkaline Phosphatase: 90 U/L (ref 39–117)
BILIRUBIN INDIRECT: 0.5 mg/dL (ref 0.2–1.2)
Bilirubin, Direct: 0.1 mg/dL (ref 0.0–0.3)
Total Bilirubin: 0.6 mg/dL (ref 0.2–1.2)
Total Protein: 6.8 g/dL (ref 6.0–8.3)

## 2014-02-04 MED ORDER — PRAVASTATIN SODIUM 20 MG PO TABS: 20.0000 mg | ORAL_TABLET | Freq: Every day | ORAL | Status: DC

## 2014-02-04 MED ORDER — SERTRALINE HCL 50 MG PO TABS: 50.0000 mg | ORAL_TABLET | Freq: Every day | ORAL | Status: DC

## 2014-02-04 MED ORDER — DIAZEPAM 5 MG PO TABS: 5.0000 mg | ORAL_TABLET | Freq: Every day | ORAL | Status: DC

## 2014-02-04 NOTE — Patient Instructions (Signed)
Please obtain labs.  I will call you with your results.  Increase Zoloft to 1 tablet daily (50 mg).  Continue valium at bedtime.  Continue all other medications as directed.  Follow-up in 1 month.

## 2014-02-04 NOTE — Progress Notes (Signed)
Pre visit review using our clinic review tool, if applicable. No additional management support is needed unless otherwise documented below in the visit note/SLS  

## 2014-02-04 NOTE — Progress Notes (Signed)
Patient presents to clinic today for follow-up of anxiety.  Patient endorses improvement with Zoloft daily.  Still feels anxiety could be under a little better control.  Endorses improved mood.  Denies SI/HI.  Is sleeping somewhat better with nightly Valium.  Patient endorses taking BP medication as directed.  BP good in clinic today. Patient due for labs.   Past Medical History  Diagnosis Date  . Skin disorder      recently diagnosed, hands, resolved  . Hypertension   . Obesity (BMI 30.0-34.9)   . Family history of premature CAD   . Cardiomyopathy 05/2013    As yet unclear Etiology - EF ~45% by Echo & Myoview  . Dyslipidemia   . Coronary artery disease     Current Outpatient Prescriptions on File Prior to Visit  Medication Sig Dispense Refill  . acetaminophen (TYLENOL) 325 MG tablet Take 650 mg by mouth as needed.      Marland Kitchen aspirin EC 81 MG tablet Take 81 mg by mouth daily.      Marland Kitchen losartan (COZAAR) 50 MG tablet Take 1 tablet (50 mg total) by mouth daily.  30 tablet  11  . nitroGLYCERIN (NITROSTAT) 0.4 MG SL tablet Place 1 tablet (0.4 mg total) under the tongue every 5 (five) minutes as needed for chest pain.  25 tablet  6  . omeprazole (PRILOSEC OTC) 20 MG tablet Take 20 mg by mouth daily.      Marland Kitchen triamcinolone cream (KENALOG) 0.5 % Apply 1 application topically 3 (three) times daily.  30 g  3   No current facility-administered medications on file prior to visit.    Allergies  Allergen Reactions  . Isosorbide Other (See Comments)    CONTINUOUS HEADACHE   . Penicillins Hives and Rash    Family History  Problem Relation Age of Onset  . Atrial fibrillation Mother     Alive at 81  . COPD Mother   . Hypertension Mother   . Hypertension Father     Alive at 48  . Lung cancer Father     Chronic smoker  . Heart attack Maternal Grandmother 48  . Heart failure Paternal Grandmother   . Diabetes Paternal Grandmother   . Heart attack Brother 34    X2  . Heart attack Sister 23   Deceased  . Factor V Leiden deficiency Father     Also Lupus anticoagulant  . Healthy Sister     x2  . Healthy Son     x2    History   Social History  . Marital Status: Married    Spouse Name: N/A    Number of Children: N/A  . Years of Education: N/A   Social History Main Topics  . Smoking status: Former Smoker -- 1.00 packs/day    Types: Cigarettes    Quit date: 08/11/2013  . Smokeless tobacco: None  . Alcohol Use: Yes  . Drug Use: No  . Sexual Activity: None   Other Topics Concern  . None   Social History Narrative   Married father of 2 boys. Lives with wife, [name], and one son.   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   Only occasional beer and mixed drinks.   Does not exercise because he is too tired.   Review of Systems - See HPI.  All other ROS are negative.  BP 106/78  Pulse 68  Temp(Src) 97.8 F (36.6 C) (Oral)  Resp 16  Ht 5\' 11"  (1.803 m)  Wt 241 lb 12 oz (109.657 kg)  BMI 33.73 kg/m2  SpO2 97%  Physical Exam  Vitals reviewed. Constitutional: He is oriented to person, place, and time and well-developed, well-nourished, and in no distress.  HENT:  Head: Normocephalic and atraumatic.  Cardiovascular: Normal rate, regular rhythm, normal heart sounds and intact distal pulses.   Pulmonary/Chest: Effort normal and breath sounds normal. No respiratory distress. He has no wheezes. He has no rales. He exhibits no tenderness.  Neurological: He is alert and oriented to person, place, and time.  Skin: Skin is warm and dry. No rash noted.  Psychiatric: Affect normal.   Assessment/Plan: Anxiety Increase Zoloft to 50 mg daily.   Increase nightly Valium to 5 mg.  Follow-up in 1 month.  Hypertension Continue current regimen.  Will obtain BMP.   Dyslipidemia, goal LDL below 100 Repeat lipid panel and hepatic panel.

## 2014-02-04 NOTE — Assessment & Plan Note (Signed)
Repeat lipid panel and hepatic panel.

## 2014-02-04 NOTE — Assessment & Plan Note (Signed)
Continue current regimen.  Will obtain BMP.

## 2014-02-04 NOTE — Telephone Encounter (Signed)
Relevant patient education assigned to patient using Emmi. ° °

## 2014-02-04 NOTE — Assessment & Plan Note (Signed)
Increase Zoloft to 50 mg daily.   Increase nightly Valium to 5 mg.  Follow-up in 1 month.

## 2014-02-11 ENCOUNTER — Encounter: Payer: Self-pay | Admitting: Internal Medicine

## 2014-03-04 ENCOUNTER — Ambulatory Visit (INDEPENDENT_AMBULATORY_CARE_PROVIDER_SITE_OTHER): Payer: BC Managed Care – PPO | Admitting: Physician Assistant

## 2014-03-04 ENCOUNTER — Encounter: Payer: Self-pay | Admitting: Physician Assistant

## 2014-03-04 VITALS — BP 104/66 | HR 85 | Temp 98.0°F | Resp 16 | Ht 71.0 in | Wt 244.0 lb

## 2014-03-04 DIAGNOSIS — I10 Essential (primary) hypertension: Secondary | ICD-10-CM

## 2014-03-04 DIAGNOSIS — F419 Anxiety disorder, unspecified: Secondary | ICD-10-CM

## 2014-03-04 DIAGNOSIS — F411 Generalized anxiety disorder: Secondary | ICD-10-CM

## 2014-03-04 MED ORDER — SERTRALINE HCL 50 MG PO TABS: 75.0000 mg | ORAL_TABLET | Freq: Every day | ORAL | Status: DC

## 2014-03-04 MED ORDER — DIAZEPAM 5 MG PO TABS: 5.0000 mg | ORAL_TABLET | Freq: Every day | ORAL | Status: DC

## 2014-03-04 NOTE — Progress Notes (Signed)
Patient presents to clinic today for one-month followup of hypertension and anxiety.  Patient endorses taking his losartan daily as directed. Denies chest pain, palpitations, lightheadedness, dizziness, headaches or vision changes. BP 104/66 in clinic. Is trying to watch his salt intake and exercising more.  For anxiety, patient states he is doing well on combination of sertraline 50 mg daily and Valium. Denies suicidal thought or ideation. Patient states the medicine is working well, but he feels that it could be a little better.  Past Medical History  Diagnosis Date  . Skin disorder      recently diagnosed, hands, resolved  . Hypertension   . Obesity (BMI 30.0-34.9)   . Family history of premature CAD   . Cardiomyopathy 05/2013    As yet unclear Etiology - EF ~45% by Echo & Myoview  . Dyslipidemia   . Coronary artery disease     Current Outpatient Prescriptions on File Prior to Visit  Medication Sig Dispense Refill  . acetaminophen (TYLENOL) 325 MG tablet Take 650 mg by mouth as needed.      Marland Kitchen aspirin EC 81 MG tablet Take 81 mg by mouth daily.      Marland Kitchen losartan (COZAAR) 50 MG tablet Take 1 tablet (50 mg total) by mouth daily.  30 tablet  11  . nitroGLYCERIN (NITROSTAT) 0.4 MG SL tablet Place 1 tablet (0.4 mg total) under the tongue every 5 (five) minutes as needed for chest pain.  25 tablet  6  . omeprazole (PRILOSEC OTC) 20 MG tablet Take 20 mg by mouth daily.      . pravastatin (PRAVACHOL) 20 MG tablet Take 1 tablet (20 mg total) by mouth daily.  30 tablet  3  . triamcinolone cream (KENALOG) 0.5 % Apply 1 application topically 3 (three) times daily.  30 g  3   No current facility-administered medications on file prior to visit.    Allergies  Allergen Reactions  . Isosorbide Other (See Comments)    CONTINUOUS HEADACHE   . Penicillins Hives and Rash    Family History  Problem Relation Age of Onset  . Atrial fibrillation Mother     Alive at 72  . COPD Mother   . Hypertension  Mother   . Hypertension Father     Alive at 45  . Lung cancer Father     Chronic smoker  . Heart attack Maternal Grandmother 48  . Heart failure Paternal Grandmother   . Diabetes Paternal Grandmother   . Heart attack Brother 57    X2  . Heart attack Sister 75    Deceased  . Factor V Leiden deficiency Father     Also Lupus anticoagulant  . Healthy Sister     x2  . Healthy Son     x2    History   Social History  . Marital Status: Married    Spouse Name: N/A    Number of Children: N/A  . Years of Education: N/A   Social History Main Topics  . Smoking status: Former Smoker -- 1.00 packs/day    Types: Cigarettes    Quit date: 08/11/2013  . Smokeless tobacco: None  . Alcohol Use: Yes  . Drug Use: No  . Sexual Activity: None   Other Topics Concern  . None   Social History Narrative   Married father of 2 boys. Lives with wife, [name], and one son.   He works as a Designer, television/film set for [employer])  He currently smokes one pack a day, cut down from 2 packs per day.   Only occasional beer and mixed drinks.   Does not exercise because he is too tired.    Review of Systems - See HPI.  All other ROS are negative.  BP 104/66  Pulse 85  Temp(Src) 98 F (36.7 C) (Oral)  Resp 16  Ht 5\' 11"  (1.803 m)  Wt 244 lb (110.678 kg)  BMI 34.05 kg/m2  SpO2 95%  Physical Exam  Vitals reviewed. Constitutional: He is oriented to person, place, and time and well-developed, well-nourished, and in no distress.  HENT:  Head: Normocephalic and atraumatic.  Eyes: Conjunctivae are normal. Pupils are equal, round, and reactive to light.  Neck: Neck supple.  Cardiovascular: Normal rate, regular rhythm, normal heart sounds and intact distal pulses.   Pulmonary/Chest: Effort normal and breath sounds normal.  Neurological: He is alert and oriented to person, place, and time.  Skin: Skin is warm and dry. No rash noted.  Psychiatric: Affect  normal.    Recent Results (from the past 2160 hour(s))  BASIC METABOLIC PANEL     Status: None   Collection Time    02/04/14  8:54 AM      Result Value Ref Range   Sodium 140  135 - 145 mEq/L   Potassium 4.3  3.5 - 5.3 mEq/L   Chloride 108  96 - 112 mEq/L   CO2 24  19 - 32 mEq/L   Glucose, Bld 95  70 - 99 mg/dL   BUN 16  6 - 23 mg/dL   Creat 0.89  0.50 - 1.35 mg/dL   Calcium 8.7  8.4 - 10.5 mg/dL  LIPID PANEL     Status: Abnormal   Collection Time    02/04/14  8:54 AM      Result Value Ref Range   Cholesterol 157  0 - 200 mg/dL   Comment: ATP III Classification:           < 200        mg/dL        Desirable          200 - 239     mg/dL        Borderline High          >= 240        mg/dL        High         Triglycerides 112  <150 mg/dL   HDL 37 (*) >39 mg/dL   Total CHOL/HDL Ratio 4.2     VLDL 22  0 - 40 mg/dL   LDL Cholesterol 98  0 - 99 mg/dL   Comment:       Total Cholesterol/HDL Ratio:CHD Risk                            Coronary Heart Disease Risk Table                                            Men       Women              1/2 Average Risk              3.4        3.3  Average Risk              5.0        4.4               2X Average Risk              9.6        7.1               3X Average Risk             23.4       11.0     Use the calculated Patient Ratio above and the CHD Risk table      to determine the patient's CHD Risk.     ATP III Classification (LDL):           < 100        mg/dL         Optimal          100 - 129     mg/dL         Near or Above Optimal          130 - 159     mg/dL         Borderline High          160 - 189     mg/dL         High           > 190        mg/dL         Very High        HEPATIC FUNCTION PANEL     Status: None   Collection Time    02/04/14  8:54 AM      Result Value Ref Range   Total Bilirubin 0.6  0.2 - 1.2 mg/dL   Bilirubin, Direct 0.1  0.0 - 0.3 mg/dL   Indirect Bilirubin 0.5  0.2 - 1.2 mg/dL    Alkaline Phosphatase 90  39 - 117 U/L   AST 19  0 - 37 U/L   ALT 21  0 - 53 U/L   Total Protein 6.8  6.0 - 8.3 g/dL   Albumin 3.8  3.5 - 5.2 g/dL    Assessment/Plan: Hypertension Blood pressure within normal limits in clinic today. BP is well controlled on current regimen. Continue current medications.  Anxiety Will increase sertraline to 100 mg daily. Continue diet as directed. Followup in one month.     [place]

## 2014-03-04 NOTE — Progress Notes (Signed)
Pre visit review using our clinic review tool, if applicable. No additional management support is needed unless otherwise documented below in the visit note/SLS  

## 2014-03-04 NOTE — Patient Instructions (Signed)
Please increase the Sertraline to 1.5 tablets daily.  Continue Valium as directed.  Continue other medications.  I will be given a report of your Colonoscopy procedure.  Follow-up with Cardiologist.  Follow-up with me in 3 months for medication management and a Complete Physical.  Come fasting to that visit.

## 2014-03-09 NOTE — Assessment & Plan Note (Signed)
Blood pressure within normal limits in clinic today. BP is well controlled on current regimen. Continue current medications.

## 2014-03-09 NOTE — Assessment & Plan Note (Signed)
Will increase sertraline to 100 mg daily. Continue diet as directed. Followup in one month.

## 2014-04-15 ENCOUNTER — Ambulatory Visit (AMBULATORY_SURGERY_CENTER): Payer: Self-pay | Admitting: *Deleted

## 2014-04-15 VITALS — Ht 71.0 in | Wt 243.0 lb

## 2014-04-15 DIAGNOSIS — Z1211 Encounter for screening for malignant neoplasm of colon: Secondary | ICD-10-CM

## 2014-04-15 MED ORDER — MOVIPREP 100 G PO SOLR: 1.0000 | Freq: Once | ORAL | Status: DC

## 2014-04-15 NOTE — Progress Notes (Signed)
No egg or soy allergy. No anesthesia problems.  No home O2.  No diet meds.  

## 2014-04-21 ENCOUNTER — Encounter: Payer: Self-pay | Admitting: Internal Medicine

## 2014-04-29 ENCOUNTER — Encounter: Payer: Self-pay | Admitting: Internal Medicine

## 2014-04-29 ENCOUNTER — Ambulatory Visit (AMBULATORY_SURGERY_CENTER): Payer: BLUE CROSS/BLUE SHIELD | Admitting: Internal Medicine

## 2014-04-29 VITALS — BP 128/91 | HR 66 | Temp 96.6°F | Resp 23 | Ht 71.0 in | Wt 243.0 lb

## 2014-04-29 DIAGNOSIS — D129 Benign neoplasm of anus and anal canal: Secondary | ICD-10-CM

## 2014-04-29 DIAGNOSIS — D120 Benign neoplasm of cecum: Secondary | ICD-10-CM

## 2014-04-29 DIAGNOSIS — Z1211 Encounter for screening for malignant neoplasm of colon: Secondary | ICD-10-CM

## 2014-04-29 DIAGNOSIS — D125 Benign neoplasm of sigmoid colon: Secondary | ICD-10-CM

## 2014-04-29 DIAGNOSIS — D122 Benign neoplasm of ascending colon: Secondary | ICD-10-CM

## 2014-04-29 DIAGNOSIS — D127 Benign neoplasm of rectosigmoid junction: Secondary | ICD-10-CM

## 2014-04-29 DIAGNOSIS — D123 Benign neoplasm of transverse colon: Secondary | ICD-10-CM

## 2014-04-29 DIAGNOSIS — D124 Benign neoplasm of descending colon: Secondary | ICD-10-CM

## 2014-04-29 DIAGNOSIS — D128 Benign neoplasm of rectum: Secondary | ICD-10-CM

## 2014-04-29 MED ORDER — SODIUM CHLORIDE 0.9 % IV SOLN: 500.0000 mL | INTRAVENOUS | Status: DC

## 2014-04-29 NOTE — Progress Notes (Signed)
Called to room to assist during endoscopic procedure.  Patient ID and intended procedure confirmed with present staff. Received instructions for my participation in the procedure from the performing physician.  

## 2014-04-29 NOTE — Progress Notes (Signed)
DR Pyrtle made aware that pt only drank 1/2 of this a.m.'s dose- drank all of last night's prep.  States clear results

## 2014-04-29 NOTE — Progress Notes (Signed)
Pt states that he had to take one nitro SL on 04-25-14 for some minor chest discomfort.  Only had to take one tablet and chest discomfort resolved.  No c/o chest pain or SOB at this time.  Laural Roes CRNA made aware

## 2014-04-29 NOTE — Progress Notes (Signed)
A/ox3, pleased with MAC, report to RN 

## 2014-04-29 NOTE — Op Note (Signed)
[facility]  [address] [place], [address]   COLONOSCOPY PROCEDURE REPORT  PATIENT: Charles, Grimes  MR#: [id number] BIRTHDATE: 09-18-1955 , 52  yrs. old GENDER: male ENDOSCOPIST: Jerene Bears, MD REFERRED BY: Elyn Aquas PROCEDURE DATE:  04/29/2014 PROCEDURE:   Colonoscopy with snare polypectomy and Colonoscopy with cold biopsy polypectomy First Screening Colonoscopy - Avg.  risk and is 50 yrs.  old or older Yes.  Prior Negative Screening - Now for repeat screening. N/A  History of Adenoma - Now for follow-up colonoscopy & has been > or = to 3 yrs.  N/A  Polyps Removed Today? Yes. ASA CLASS:   Class III INDICATIONS:average risk for colorectal cancer and first colonoscopy. MEDICATIONS: Monitored anesthesia care and Propofol 350 mg IV  DESCRIPTION OF PROCEDURE:   After the risks benefits and alternatives of the procedure were thoroughly explained, informed consent was obtained.  The digital rectal exam revealed no rectal mass.   The LB [id number]  endoscope was introduced through the anus and advanced to the cecum, which was identified by both the appendix and ileocecal valve. No adverse events experienced. The quality of the prep was good, using MoviPrep  The instrument was then slowly withdrawn as the colon was fully examined.      COLON FINDINGS: Six sessile polyps ranging between 3-62mm in size were found in the ascending colon (4) and at the cecum (2). Polypectomies were performed with cold forceps.  The resection was complete, the polyp tissue was completely retrieved and sent to histology.   Five sessile polyps ranging between 3-63mm in size were found in the transverse colon.  Polypectomies were performed with a cold snare.  The resection was complete, the polyp tissue was completely retrieved and sent to histology.   Six sessile polyps ranging from 5 to 22mm in size were found in the sigmoid colon (3) and descending colon (3).  Polypectomies  were performed with a cold snare.  The resection was complete, the polyp tissue was completely retrieved and sent to histology.   Six sessile polyps ranging from 4 to 63mm in size were found in the rectum (4)  and rectosigmoid colon (2).  Polypectomies were performed with a cold snare.  The resection was complete, the polyp tissue was completely retrieved and sent to histology.  Retroflexed views revealed external hemorrhoids. The time to cecum=2 minutes 40 seconds.  Withdrawal time=27 minutes 04 seconds.  The scope was withdrawn and the procedure completed.  COMPLICATIONS: There were no immediate complications.  ENDOSCOPIC IMPRESSION: 1.   Six sessile polyps ranging between 3-62mm in size were found in the ascending colon and at the cecum; polypectomies were performed with cold forceps 2.   Five sessile polyps ranging between 3-37mm in size were found in the transverse colon; polypectomies were performed with a cold snare 3.   Six sessile polyps ranging from 5 to 72mm in size were found in the sigmoid colon and descending colon; polypectomies were performed with a cold snare 4.   Six sessile polyps ranging from 4 to 56mm in size were found in the rectum and rectosigmoid colon; polypectomies were performed with a cold snare  RECOMMENDATIONS: 1.  Avoid all NSAIDS for the next 2 weeks. 2.  Await pathology results 3.  Timing of repeat colonoscopy will be determined by pathology findings. 4.  You will receive a letter within 1-2 weeks with the results of your biopsy as well as final recommendations.  Please call my office if  you have not received a letter after 3 weeks.  eSigned:  Jerene Bears, MD 04/29/2014 10:28 AM   cc: The Patient; Elyn Aquas   PATIENT NAME:  Charles, Grimes MR#: [id number]

## 2014-04-29 NOTE — Patient Instructions (Signed)
YOU HAD AN ENDOSCOPIC PROCEDURE TODAY AT THE [facility]: Refer to the procedure report that was given to you for any specific questions about what was found during the examination.  If the procedure report does not answer your questions, please call your gastroenterologist to clarify.  If you requested that your care partner not be given the details of your procedure findings, then the procedure report has been included in a sealed envelope for you to review at your convenience later.  YOU SHOULD EXPECT: Some feelings of bloating in the abdomen. Passage of more gas than usual.  Walking can help get rid of the air that was put into your GI tract during the procedure and reduce the bloating. If you had a lower endoscopy (such as a colonoscopy or flexible sigmoidoscopy) you may notice spotting of blood in your stool or on the toilet paper. If you underwent a bowel prep for your procedure, then you may not have a normal bowel movement for a few days.  DIET: Your first meal following the procedure should be a light meal and then it is ok to progress to your normal diet.  A half-sandwich or bowl of soup is an example of a good first meal.  Heavy or fried foods are harder to digest and may make you feel nauseous or bloated.  Likewise meals heavy in dairy and vegetables can cause extra gas to form and this can also increase the bloating.  Drink plenty of fluids but you should avoid alcoholic beverages for 24 hours.  ACTIVITY: Your care partner should take you home directly after the procedure.  You should plan to take it easy, moving slowly for the rest of the day.  You can resume normal activity the day after the procedure however you should NOT DRIVE or use heavy machinery for 24 hours (because of the sedation medicines used during the test).    SYMPTOMS TO REPORT IMMEDIATELY: A gastroenterologist can be reached at any hour.  During normal business hours, 8:30 AM to 5:00 PM Monday through Friday,  call [phone].  After hours and on weekends, please call the GI answering service at [phone] who will take a message and have the physician on call contact you.   Following lower endoscopy (colonoscopy or flexible sigmoidoscopy):  Excessive amounts of blood in the stool  Significant tenderness or worsening of abdominal pains  Swelling of the abdomen that is new, acute  Fever of 100F or higher  FOLLOW UP: If any biopsies were taken you will be contacted by phone or by letter within the next 1-3 weeks.  Call your gastroenterologist if you have not heard about the biopsies in 3 weeks.  Our staff will call the home number listed on your records the next business day following your procedure to check on you and address any questions or concerns that you may have at that time regarding the information given to you following your procedure. This is a courtesy call and so if there is no answer at the home number and we have not heard from you through the emergency physician on call, we will assume that you have returned to your regular daily activities without incident.  SIGNATURES/CONFIDENTIALITY: You and/or your care partner have signed paperwork which will be entered into your electronic medical record.  These signatures attest to the fact that that the information above on your After Visit Summary has been reviewed and is understood.  Full responsibility of the confidentiality of this   discharge information lies with you and/or your care-partner.  Polyp information given.  Avoid all non-steroidal anti-inflammatory medications for 2 weeks.  Timing of next colonoscopy will depend upon pathology reports, you will receive a letter from Dr. Hilarie Fredrickson to let you know.

## 2014-04-30 ENCOUNTER — Telehealth: Payer: Self-pay | Admitting: *Deleted

## 2014-04-30 NOTE — Telephone Encounter (Signed)
  Follow up Call-  Call back number 04/29/2014  Post procedure Call Back phone  # [phone]  Permission to leave phone message Yes     Patient questions:  Do you have a fever, pain , or abdominal swelling? No. Pain Score  0 *  Have you tolerated food without any problems? Yes.    Have you been able to return to your normal activities? Yes.    Do you have any questions about your discharge instructions: Diet   No. Medications  No. Follow up visit  No.  Do you have questions or concerns about your Care? No.  Actions: * If pain score is 4 or above: No action needed, pain <4.  Pt states he passed a "small amt of blood" yesterday, but none since

## 2014-05-04 ENCOUNTER — Encounter: Payer: Self-pay | Admitting: Internal Medicine

## 2014-05-16 ENCOUNTER — Encounter (HOSPITAL_COMMUNITY): Payer: Self-pay | Admitting: Emergency Medicine

## 2014-05-16 ENCOUNTER — Emergency Department (HOSPITAL_COMMUNITY): Payer: BLUE CROSS/BLUE SHIELD

## 2014-05-16 ENCOUNTER — Emergency Department (HOSPITAL_COMMUNITY)
Admission: EM | Admit: 2014-05-16 | Discharge: 2014-05-17 | Disposition: A | Discharge status: Home / Self Care | Payer: BLUE CROSS/BLUE SHIELD | Attending: Emergency Medicine | Admitting: Emergency Medicine

## 2014-05-16 DIAGNOSIS — Y9389 Activity, other specified: Secondary | ICD-10-CM | POA: Insufficient documentation

## 2014-05-16 DIAGNOSIS — S79911A Unspecified injury of right hip, initial encounter: Secondary | ICD-10-CM | POA: Insufficient documentation

## 2014-05-16 DIAGNOSIS — S3992XA Unspecified injury of lower back, initial encounter: Secondary | ICD-10-CM | POA: Diagnosis present

## 2014-05-16 DIAGNOSIS — Z872 Personal history of diseases of the skin and subcutaneous tissue: Secondary | ICD-10-CM | POA: Diagnosis not present

## 2014-05-16 DIAGNOSIS — W19XXXA Unspecified fall, initial encounter: Secondary | ICD-10-CM

## 2014-05-16 DIAGNOSIS — Z87891 Personal history of nicotine dependence: Secondary | ICD-10-CM | POA: Insufficient documentation

## 2014-05-16 DIAGNOSIS — Y9289 Other specified places as the place of occurrence of the external cause: Secondary | ICD-10-CM | POA: Insufficient documentation

## 2014-05-16 DIAGNOSIS — I2510 Atherosclerotic heart disease of native coronary artery without angina pectoris: Secondary | ICD-10-CM | POA: Insufficient documentation

## 2014-05-16 DIAGNOSIS — Z7952 Long term (current) use of systemic steroids: Secondary | ICD-10-CM | POA: Diagnosis not present

## 2014-05-16 DIAGNOSIS — E785 Hyperlipidemia, unspecified: Secondary | ICD-10-CM | POA: Insufficient documentation

## 2014-05-16 DIAGNOSIS — S32049A Unspecified fracture of fourth lumbar vertebra, initial encounter for closed fracture: Secondary | ICD-10-CM | POA: Insufficient documentation

## 2014-05-16 DIAGNOSIS — Z7982 Long term (current) use of aspirin: Secondary | ICD-10-CM | POA: Insufficient documentation

## 2014-05-16 DIAGNOSIS — Z79899 Other long term (current) drug therapy: Secondary | ICD-10-CM | POA: Insufficient documentation

## 2014-05-16 DIAGNOSIS — E669 Obesity, unspecified: Secondary | ICD-10-CM | POA: Diagnosis not present

## 2014-05-16 DIAGNOSIS — Y92009 Unspecified place in unspecified non-institutional (private) residence as the place of occurrence of the external cause: Secondary | ICD-10-CM

## 2014-05-16 DIAGNOSIS — F329 Major depressive disorder, single episode, unspecified: Secondary | ICD-10-CM | POA: Insufficient documentation

## 2014-05-16 DIAGNOSIS — F419 Anxiety disorder, unspecified: Secondary | ICD-10-CM | POA: Insufficient documentation

## 2014-05-16 DIAGNOSIS — K219 Gastro-esophageal reflux disease without esophagitis: Secondary | ICD-10-CM | POA: Diagnosis not present

## 2014-05-16 DIAGNOSIS — I10 Essential (primary) hypertension: Secondary | ICD-10-CM | POA: Insufficient documentation

## 2014-05-16 DIAGNOSIS — Z880 Allergy status to penicillin: Secondary | ICD-10-CM | POA: Diagnosis not present

## 2014-05-16 DIAGNOSIS — S3991XA Unspecified injury of abdomen, initial encounter: Secondary | ICD-10-CM | POA: Diagnosis not present

## 2014-05-16 DIAGNOSIS — W132XXA Fall from, out of or through roof, initial encounter: Secondary | ICD-10-CM | POA: Insufficient documentation

## 2014-05-16 LAB — CBC
HEMATOCRIT: 42.8 % (ref 39.0–52.0)
Hemoglobin: 14.4 g/dL (ref 13.0–17.0)
MCH: 29.9 pg (ref 26.0–34.0)
MCHC: 33.6 g/dL (ref 30.0–36.0)
MCV: 88.8 fL (ref 78.0–100.0)
Platelets: 186 10*3/uL (ref 150–400)
RBC: 4.82 MIL/uL (ref 4.22–5.81)
RDW: 13.5 % (ref 11.5–15.5)
WBC: 10.8 10*3/uL — AB (ref 4.0–10.5)

## 2014-05-16 LAB — SAMPLE TO BLOOD BANK

## 2014-05-16 LAB — COMPREHENSIVE METABOLIC PANEL
ALK PHOS: 99 U/L (ref 39–117)
ALT: 20 U/L (ref 0–53)
AST: 26 U/L (ref 0–37)
Albumin: 3.5 g/dL (ref 3.5–5.2)
Anion gap: 11 (ref 5–15)
BILIRUBIN TOTAL: 0.3 mg/dL (ref 0.3–1.2)
BUN: 19 mg/dL (ref 6–23)
CHLORIDE: 106 meq/L (ref 96–112)
CO2: 24 mEq/L (ref 19–32)
CREATININE: 0.91 mg/dL (ref 0.50–1.35)
Calcium: 8.8 mg/dL (ref 8.4–10.5)
GFR calc Af Amer: >90 mL/min (ref >90)
GFR calc non Af Amer: >90 mL/min (ref >90)
Glucose, Bld: 94 mg/dL (ref 70–99)
POTASSIUM: 4.4 meq/L (ref 3.7–5.3)
Sodium: 141 mEq/L (ref 137–147)
Total Protein: 7.2 g/dL (ref 6.0–8.3)

## 2014-05-16 LAB — I-STAT CG4 LACTIC ACID, ED: Lactic Acid, Venous: 0.55 mmol/L (ref 0.5–2.2)

## 2014-05-16 LAB — LIPASE, BLOOD: Lipase: 37 U/L (ref 11–59)

## 2014-05-16 LAB — ETHANOL: Alcohol, Ethyl (B): <11 mg/dL (ref 0–11)

## 2014-05-16 MED ORDER — HYDROMORPHONE HCL 1 MG/ML IJ SOLN
1.0000 mg | Freq: Once | INTRAMUSCULAR | Status: AC
  Administered 2014-05-16: 1 mg via INTRAVENOUS
  Filled 2014-05-16: qty 1

## 2014-05-16 MED ORDER — OXYCODONE-ACETAMINOPHEN 5-325 MG PO TABS: 1.0000 | ORAL_TABLET | Freq: Four times a day (QID) | ORAL | Status: DC | PRN

## 2014-05-16 MED ORDER — ONDANSETRON HCL 4 MG/2ML IJ SOLN
4.0000 mg | Freq: Once | INTRAMUSCULAR | Status: AC
  Administered 2014-05-16: 4 mg via INTRAVENOUS
  Filled 2014-05-16: qty 2

## 2014-05-16 MED ORDER — IOHEXOL 300 MG/ML  SOLN
100.0000 mL | Freq: Once | INTRAMUSCULAR | Status: AC | PRN
  Administered 2014-05-16: 100 mL via INTRAVENOUS

## 2014-05-16 NOTE — ED Notes (Signed)
MD at bedside. 

## 2014-05-16 NOTE — Progress Notes (Signed)
Orthopedic Tech Progress Note Patient Details:  Charles Grimes [birth year] [id number] Called [employer] for brace order talked to [facility]. Patient ID: Charles Grimes, male   DOB: 08-26-1955, 58 y.o.   MRN: [id number]   [name] 05/16/2014, 10:54 PM
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
## 2014-05-16 NOTE — Discharge Instructions (Signed)
1. Wear TLSO if walking or moving around 2. Okay to take off when laying flat 3. Take pain medication as needed 4. Call to get follow up with neurosurgery Vertebral Fracture A vertebral fracture is when one or more bones in the spine are broken (fractured). These bones are called vertebra. You may have pain and be stiff for 3 to 6 weeks.  HOME CARE   Rest in bed for a few days.  Take pain medicine as told by your doctor.  Slowly return to activity as told by your doctor.  Ask your doctor if any physical therapy is needed. GET HELP RIGHT AWAY IF:   Your pain gets worse.  You throw up (vomit).  You cannot move around at all.  You have numbness, tingling, weakness, or cannot move any part of your body.  You cannot control your poop (bowel movements) or pee (urination).  You have chest or belly (abdominal) pain, coughing, or trouble breathing.  You have a temperature by mouth above 102 F (38.9 C), not controlled by medicine. MAKE SURE YOU:   Understand these instructions.  Will watch your condition.  Will get help right away if you are not doing well or get worse. Document Released: 12/21/2009 Document Revised: 04/23/2013 Document Reviewed: 12/21/2009 [facility] Patient Information 2015 [place], [place]. This information is not intended to replace advice given to you by your health care provider. Make sure you discuss any questions you have with your health care provider.

## 2014-05-16 NOTE — ED Notes (Signed)
Pt still in radiology.

## 2014-05-16 NOTE — ED Notes (Signed)
Patient transported to X-ray 

## 2014-05-16 NOTE — ED Notes (Signed)
Ortho paged. 

## 2014-05-16 NOTE — ED Notes (Signed)
Ortho informed pt's brace is at [facility], called to apply.
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
## 2014-05-16 NOTE — ED Provider Notes (Signed)
CSN: [id number]     Arrival date & time 05/16/14  1748 History   First MD Initiated Contact with Patient 05/16/14 1757     Chief Complaint  Patient presents with  . Fall     (Consider location/radiation/quality/duration/timing/severity/associated sxs/prior Treatment) Patient is a 58 y.o. male presenting with fall. The history is provided by the patient and the EMS personnel. No language interpreter was used.  Fall This is a new problem. The current episode started today. The problem occurs rarely. The problem has been gradually worsening. Associated symptoms include abdominal pain. Pertinent negatives include no chest pain, congestion, coughing, fever, headaches, nausea, neck pain, numbness, rash, sore throat, urinary symptoms or vomiting. Exacerbated by: movement. He has tried immobilization for the symptoms. The treatment provided mild relief.    Past Medical History  Diagnosis Date  . Skin disorder      recently diagnosed, hands, resolved  . Hypertension   . Obesity (BMI 30.0-34.9)   . Family history of premature CAD   . Cardiomyopathy 05/2013    As yet unclear Etiology - EF ~45% by Echo & Myoview  . Dyslipidemia   . Coronary artery disease   . Anxiety   . Depression   . GERD (gastroesophageal reflux disease)    Past Surgical History  Procedure Laterality Date  . Nm myoview ltd  06/03/2013    Low risk study with no ischemia. EF roughly 44% with no regional wall motion abnormalities noted  . Transthoracic echocardiogram  06/11/2013    Mildly reduced EF: 45-50%. Mild anterior hypokinesis with incoordinate septal motion. No evidence of pulmonary hypertension  . Lower extremity arterial dopplers  06/11/2013    No occlusive disease  . Pfts  06/2013    Normal Volumes &  Spirometry; Moderately reduced DLCO  . Cardiac catheterization  August 26 2013    Mild to moderate disease with 40-50% stenosis and OM1. Otherwise no significant CAD   Family History  Problem Relation Age  of Onset  . Atrial fibrillation Mother     Alive at 59  . COPD Mother   . Hypertension Mother   . Hypertension Father     Alive at 62  . Lung cancer Father     Chronic smoker  . Factor V Leiden deficiency Father     Also Lupus anticoagulant  . Heart attack Maternal Grandmother 48  . Heart failure Paternal Grandmother   . Diabetes Paternal Grandmother   . Heart attack Brother 83    X2  . Heart attack Sister 52    Deceased  . Healthy Sister     x2  . Healthy Son     x2  . Colon cancer Neg Hx   . Esophageal cancer Neg Hx   . Rectal cancer Neg Hx   . Stomach cancer Neg Hx    History  Substance Use Topics  . Smoking status: Former Smoker -- 1.00 packs/day    Types: Cigarettes    Quit date: 08/11/2013  . Smokeless tobacco: Never Used  . Alcohol Use: Yes     Comment: rarely    Review of Systems  Constitutional: Negative for fever.  HENT: Negative for congestion, rhinorrhea and sore throat.   Respiratory: Negative for cough and shortness of breath.   Cardiovascular: Negative for chest pain.  Gastrointestinal: Positive for abdominal pain. Negative for nausea, vomiting and diarrhea.  Genitourinary: Negative for dysuria and hematuria.  Musculoskeletal: Positive for back pain. Negative for neck pain.  Skin: Negative for rash.  Neurological: Negative for syncope, light-headedness, numbness and headaches.  All other systems reviewed and are negative.     Allergies  Isosorbide and Penicillins  Home Medications   Prior to Admission medications   Medication Sig Start Date End Date Taking? Authorizing Provider  acetaminophen (TYLENOL) 325 MG tablet Take 650 mg by mouth as needed.    Historical Provider, MD  aspirin EC 81 MG tablet Take 81 mg by mouth daily.    Historical Provider, MD  diazepam (VALIUM) 5 MG tablet Take 1 tablet (5 mg total) by mouth at bedtime. 03/04/14   Brunetta Jeans, PA-C  losartan (COZAAR) 50 MG tablet Take 1 tablet (50 mg total) by mouth daily.  07/28/13   Leonie Man, MD  nitroGLYCERIN (NITROSTAT) 0.4 MG SL tablet Place 1 tablet (0.4 mg total) under the tongue every 5 (five) minutes as needed for chest pain. 09/16/13   Leonie Man, MD  omeprazole (PRILOSEC OTC) 20 MG tablet Take 20 mg by mouth daily.    Historical Provider, MD  pravastatin (PRAVACHOL) 20 MG tablet Take 1 tablet (20 mg total) by mouth daily. 02/04/14   Brunetta Jeans, PA-C  sertraline (ZOLOFT) 50 MG tablet Take 1.5 tablets (75 mg total) by mouth daily. 03/04/14   Brunetta Jeans, PA-C  triamcinolone cream (KENALOG) 0.5 % Apply 1 application topically 3 (three) times daily. 09/16/13   Leonie Man, MD   BP 151/121  Pulse 72  Temp(Src) 97.5 F (36.4 C) (Oral)  Resp 22  SpO2 93% Physical Exam  Nursing note and vitals reviewed. General: well nourished, well hydrated, no acute distress Head: no midface instability Eyes: conjunctivae and lids normal; pupils equal, round, reactive to light Neck: supple, no masses, trachea midline. C-collar: on Spine: No cervical tenderness, endorses thoracic, lumbar tenderness. Normal rectal tone.  Respiratory: Trachea midline,no intercostal retractions or use of accessory muscles, clear to auscultation bilaterally Cardiovascular: Nml S1, S2, no murmur, rub, or gallop, radial pulses 2+, symmetric Gastrointestinal: Abdomen soft, tender right lower quadrant, non-distended, no masses, bowel sounds normal. No rectal bleeding.  Extremities: MAEW, FROM, no cyanosis, clubbing or edema Mental Status: judgment, insight intact; oriented to time, place, and person   ED Course  Procedures (including critical care time) Labs Review Labs Reviewed  CBC - Abnormal; Notable for the following:    WBC 10.8 (*)    All other components within normal limits  COMPREHENSIVE METABOLIC PANEL  ETHANOL  LIPASE, BLOOD  I-STAT CG4 LACTIC ACID, ED  SAMPLE TO BLOOD BANK    Imaging Review Dg Chest 1 View  05/16/2014   CLINICAL DATA:  Fall from  roof approximately 10-12 seen  EXAM: CHEST - 1 VIEW  COMPARISON:  05/15/2013  FINDINGS: Cardiac shadow is enlarged. The lungs are clear bilaterally. No acute bony abnormality is seen at this time.  IMPRESSION: No acute abnormality noted.   Electronically Signed   By: Inez Catalina M.D.   On: 05/16/2014 19:56   Dg Pelvis 1-2 Views  05/16/2014   CLINICAL DATA:  Fall from roof for approximately 10-12 feet with pain  EXAM: PELVIS - 1-2 VIEW  COMPARISON:  None.  FINDINGS: There is no evidence of pelvic fracture or diastasis. No pelvic bone lesions are seen.  IMPRESSION: No acute abnormality noted.   Electronically Signed   By: Inez Catalina M.D.   On: 05/16/2014 19:57   Dg Hip Complete Right  05/16/2014   CLINICAL DATA:  Fall from roof for approximately 10-12  feet  EXAM: RIGHT HIP - COMPLETE 2+ VIEW  COMPARISON:  None.  FINDINGS: There is no evidence of hip fracture or dislocation. There is no evidence of arthropathy or other focal bone abnormality.  IMPRESSION: No acute abnormality noted.   Electronically Signed   By: Inez Catalina M.D.   On: 05/16/2014 19:59   Ct Chest W Contrast  05/16/2014   CLINICAL DATA:  Fall  EXAM: CT CHEST, ABDOMEN, AND PELVIS WITH CONTRAST  TECHNIQUE: Multidetector CT imaging of the chest, abdomen and pelvis was performed following the standard protocol during bolus administration of intravenous contrast.  CONTRAST:  157mL OMNIPAQUE IOHEXOL 300 MG/ML  SOLN  COMPARISON:  None.  FINDINGS: CT CHEST FINDINGS  Mediastinum: The trachea appears patent and is midline. The esophagus is unremarkable. There is no mediastinal or hilar adenopathy. No axillary or supraclavicular adenopathy.  Lungs/Pleura: No pleural effusion identified. Dependent changes are identified within both lungs posteriorly. Mild changes of centrilobular emphysema identified.  Musculoskeletal: Review of the visualized osseous structures is unremarkable. There is spondylosis noted within the mid thoracic spine.  CT ABDOMEN  AND PELVIS FINDINGS  Hepatobiliary: No focal liver abnormality. The gallbladder is normal. No biliary dilatation.  Pancreas: Normal appearance of the pancreas.  Spleen: The spleen is unremarkable.  Adrenals/Urinary Tract: Normal appearance of the adrenal glands. The right kidney is normal. There is a tiny stone within the inferior pole the left kidney measured 3 mm. The urinary bladder appears normal.  Stomach/Bowel: The stomach and the small bowel loops have a normal course and caliber. The appendix is visualized and appears normal. Normal appearance of the colon.  Vascular/Lymphatic: Calcified atherosclerotic disease involves the abdominal aorta. No aneurysm. There is no retroperitoneal adenopathy identified. No small bowel mesenteric adenopathy. No inguinal adenopathy.  Reproductive: Prostate gland and seminal vesicles appear normal.  Other: No free fluid within the abdomen or pelvis.  Musculoskeletal: There is a fracture involving the L4 vertebra. The fracture appears vertically oriented from the superior endplate. See report from CT of the lumbar spine.  IMPRESSION: 1. No acute findings within the chest. 2. L4 vertebral fracture. See report from CT lumbar spine obtained today. 3. No acute findings identified within the abdomen or pelvis.   Electronically Signed   By: Kerby Moors M.D.   On: 05/16/2014 21:43   Ct Cervical Spine Wo Contrast  05/16/2014   CLINICAL DATA:  Back pain and right leg pain. Buttock pain. Pelvic pain. Right arm pain. The patient fell from a roof tonight.  EXAM: CT CERVICAL, THORACIC, AND LUMBAR SPINE WITHOUT CONTRAST  TECHNIQUE: Multidetector CT imaging of the cervical, thoracic and lumbar spine was performed without intravenous contrast. Multiplanar CT image reconstructions were also generated.  COMPARISON:  None.  FINDINGS: CT CERVICAL SPINE FINDINGS  There is no fracture, subluxation, or prevertebral soft tissue swelling. There is degenerative disc disease at C5-6 and C6-7 with  disc space narrowing. No foraminal or spinal stenosis. No facet arthritis except for mild degenerative changes at C7-T1 on the left.  CT THORACIC SPINE FINDINGS  There is no fracture or subluxation. There is small old Schmorl's node in the superior endplate of T4. Anterior osteophytes fuse the T2-3 and T3-4 levels. There are prominent anterior osteophytes at T11-12 and T12-L1. There is no disc protrusion or paraspinal hemorrhage or edema.  There is slight dependent edema in both lungs.  CT LUMBAR SPINE FINDINGS  There is auto fusion of the L5-S1 level. There is a subtle coronal fracture through  the mid L4 vertebra with slight impaction of the central portion of the superior endplate of L4. There is moderate right facet arthritis at L2-3 and L3-4 and there is moderate bilateral facet arthritis at L4-5 with a small broad-based disc bulge.  IMPRESSION: 1. Coronal fracture through the L4 vertebral body. No displacement. No neural impingement. 2. No acute abnormality of the cervical or thoracic spine.   Electronically Signed   By: Rozetta Nunnery M.D.   On: 05/16/2014 21:39   Ct Thoracic Spine Wo Contrast  05/16/2014   CLINICAL DATA:  Back pain and right leg pain. Buttock pain. Pelvic pain. Right arm pain. The patient fell from a roof tonight.  EXAM: CT CERVICAL, THORACIC, AND LUMBAR SPINE WITHOUT CONTRAST  TECHNIQUE: Multidetector CT imaging of the cervical, thoracic and lumbar spine was performed without intravenous contrast. Multiplanar CT image reconstructions were also generated.  COMPARISON:  None.  FINDINGS: CT CERVICAL SPINE FINDINGS  There is no fracture, subluxation, or prevertebral soft tissue swelling. There is degenerative disc disease at C5-6 and C6-7 with disc space narrowing. No foraminal or spinal stenosis. No facet arthritis except for mild degenerative changes at C7-T1 on the left.  CT THORACIC SPINE FINDINGS  There is no fracture or subluxation. There is small old Schmorl's node in the superior  endplate of T4. Anterior osteophytes fuse the T2-3 and T3-4 levels. There are prominent anterior osteophytes at T11-12 and T12-L1. There is no disc protrusion or paraspinal hemorrhage or edema.  There is slight dependent edema in both lungs.  CT LUMBAR SPINE FINDINGS  There is auto fusion of the L5-S1 level. There is a subtle coronal fracture through the mid L4 vertebra with slight impaction of the central portion of the superior endplate of L4. There is moderate right facet arthritis at L2-3 and L3-4 and there is moderate bilateral facet arthritis at L4-5 with a small broad-based disc bulge.  IMPRESSION: 1. Coronal fracture through the L4 vertebral body. No displacement. No neural impingement. 2. No acute abnormality of the cervical or thoracic spine.   Electronically Signed   By: Rozetta Nunnery M.D.   On: 05/16/2014 21:39   Ct Lumbar Spine Wo Contrast  05/16/2014   CLINICAL DATA:  Back pain and right leg pain. Buttock pain. Pelvic pain. Right arm pain. The patient fell from a roof tonight.  EXAM: CT CERVICAL, THORACIC, AND LUMBAR SPINE WITHOUT CONTRAST  TECHNIQUE: Multidetector CT imaging of the cervical, thoracic and lumbar spine was performed without intravenous contrast. Multiplanar CT image reconstructions were also generated.  COMPARISON:  None.  FINDINGS: CT CERVICAL SPINE FINDINGS  There is no fracture, subluxation, or prevertebral soft tissue swelling. There is degenerative disc disease at C5-6 and C6-7 with disc space narrowing. No foraminal or spinal stenosis. No facet arthritis except for mild degenerative changes at C7-T1 on the left.  CT THORACIC SPINE FINDINGS  There is no fracture or subluxation. There is small old Schmorl's node in the superior endplate of T4. Anterior osteophytes fuse the T2-3 and T3-4 levels. There are prominent anterior osteophytes at T11-12 and T12-L1. There is no disc protrusion or paraspinal hemorrhage or edema.  There is slight dependent edema in both lungs.  CT LUMBAR  SPINE FINDINGS  There is auto fusion of the L5-S1 level. There is a subtle coronal fracture through the mid L4 vertebra with slight impaction of the central portion of the superior endplate of L4. There is moderate right facet arthritis at L2-3 and L3-4 and there is  moderate bilateral facet arthritis at L4-5 with a small broad-based disc bulge.  IMPRESSION: 1. Coronal fracture through the L4 vertebral body. No displacement. No neural impingement. 2. No acute abnormality of the cervical or thoracic spine.   Electronically Signed   By: Rozetta Nunnery M.D.   On: 05/16/2014 21:39   Ct Abdomen Pelvis W Contrast  05/16/2014   CLINICAL DATA:  Fall  EXAM: CT CHEST, ABDOMEN, AND PELVIS WITH CONTRAST  TECHNIQUE: Multidetector CT imaging of the chest, abdomen and pelvis was performed following the standard protocol during bolus administration of intravenous contrast.  CONTRAST:  118mL OMNIPAQUE IOHEXOL 300 MG/ML  SOLN  COMPARISON:  None.  FINDINGS: CT CHEST FINDINGS  Mediastinum: The trachea appears patent and is midline. The esophagus is unremarkable. There is no mediastinal or hilar adenopathy. No axillary or supraclavicular adenopathy.  Lungs/Pleura: No pleural effusion identified. Dependent changes are identified within both lungs posteriorly. Mild changes of centrilobular emphysema identified.  Musculoskeletal: Review of the visualized osseous structures is unremarkable. There is spondylosis noted within the mid thoracic spine.  CT ABDOMEN AND PELVIS FINDINGS  Hepatobiliary: No focal liver abnormality. The gallbladder is normal. No biliary dilatation.  Pancreas: Normal appearance of the pancreas.  Spleen: The spleen is unremarkable.  Adrenals/Urinary Tract: Normal appearance of the adrenal glands. The right kidney is normal. There is a tiny stone within the inferior pole the left kidney measured 3 mm. The urinary bladder appears normal.  Stomach/Bowel: The stomach and the small bowel loops have a normal course and  caliber. The appendix is visualized and appears normal. Normal appearance of the colon.  Vascular/Lymphatic: Calcified atherosclerotic disease involves the abdominal aorta. No aneurysm. There is no retroperitoneal adenopathy identified. No small bowel mesenteric adenopathy. No inguinal adenopathy.  Reproductive: Prostate gland and seminal vesicles appear normal.  Other: No free fluid within the abdomen or pelvis.  Musculoskeletal: There is a fracture involving the L4 vertebra. The fracture appears vertically oriented from the superior endplate. See report from CT of the lumbar spine.  IMPRESSION: 1. No acute findings within the chest. 2. L4 vertebral fracture. See report from CT lumbar spine obtained today. 3. No acute findings identified within the abdomen or pelvis.   Electronically Signed   By: Kerby Moors M.D.   On: 05/16/2014 21:43   Dg Humerus Right  05/16/2014   CLINICAL DATA:  Fall from roof approximately tender 12 feet with arm pain  EXAM: RIGHT HUMERUS - 2+ VIEW  COMPARISON:  None.  FINDINGS: Degenerative changes of the acromioclavicular joint are noted. No humeral fracture is noted. No underlying rib fracture is noted.  IMPRESSION: No acute abnormality is noted.   Electronically Signed   By: Inez Catalina M.D.   On: 05/16/2014 19:59     EKG Interpretation None      MDM   Final diagnoses:  None    5:57 PM Pt is a 58 y.o. male with pertinent PMHX of GERD HTN, CAD, HLD who presents to the ED with fall. Patient working on a roof when he slid down the side of the roof and fell approximately 110ft onto his bottom and then onto his back. Endorsing right hip,. Right arm, thoracic and lumbar back pain and abdominal pain. No LOc or amnesia to event. No nausea, vomiting or diarrhea. Denies chest pain or shortness of breath. No syncope prior to event. Tetanus UTD  Primary survey intact: dropping sats to 90-91%. Placed on oxygen 2L [place]. No chest tenderness. RLQ abodminal tenderness.  Moving all  extremities with tenderness in thoracic and lumbar spine. Concern for lumbar/thoracic injury. Plan for CT chest abdomen pelvis w contrast with thoracic and lumbar scans also. Will obtain plain films of the chest, pelvis, right humerus and right hip. Will also obtain trauma labs and give dilaudid for pain  Review of labs: istat lactic acid: 0.55 CMP: no electrolyte abnormalities Lipase: 37 CBc: leukocytosis, H&H 14.4/42.8 ETOH: <11  Xr right humerus per my read  For righ arm pain negative for acute fracture  XR right hip per my rad showed no acute fracture or dislocation  CXr AP for fall per my raed showed no rib fractures or ptx  XR pelvis ap for fall per my rad showed no acute fracture or dislocation   CT abdomen pelvis w contrast concerning for L4 coronal fracture. No intraabdominal injuries  Ct cervical spine negative for acute fracture  CT chest w contrast no evidence of acute injury  Ct thoracic spine wo contrast no evidence of acute fracture  CT lumbar spine showed concern for coronal fracture of L4.  Discussed with Dr. Saintclair Halsted, plan for TLSO, uprights and discharge with close follow up with Dr. Saintclair Halsted next week. Strict return precautions given  12:14 AM:  I have discussed the diagnosis/risks/treatment options with the patient and family and believe the pt to be eligible for discharge home to follow-up with Dr. Saintclair Halsted neurosurgery. We also discussed returning to the ED immediately if new or worsening sx occur. We discussed the sx which are most concerning (e.g., worsening symptoms) that necessitate immediate return. Any new prescriptions provided to the patient are listed below.   New Prescriptions   OXYCODONE-ACETAMINOPHEN (PERCOCET/ROXICET) 5-325 MG PER TABLET    Take 1-2 tablets by mouth every 6 (six) hours as needed for moderate pain or severe pain.    The patient appears reasonably screened and/or stabilized for discharge and I doubt any other medical condition or other [facility]  requiring further screening, evaluation or treatment in the ED at this time prior to discharge . Pt in agreement with discharge plan. Return precautions given. Pt discharged VSS   Labs and imaging reviewed by myself and considered in medical decision making if ordered.  Imaging interpreted by radiology. Pt was discussed with my attending, Dr. Idelle Crouch, MD 05/17/14 [phone]

## 2014-05-16 NOTE — ED Notes (Signed)
MD informed of pt's request for pain medication.

## 2014-05-16 NOTE — ED Notes (Signed)
Pt. Presents to ED by [facility]. Fall 10 ft from roof landing on grass on butt then falling onto back. Denies LOC or hitting head. Per [facility] no noticeable rotation or deformity of any limbs or pelvis. Full ROM of all four extremities but painful. Complaint of mid thoracic lumber tenderness radiating to legs. Pain from R elbow to shoulder. Left hand laceration. Bilateral pelvic pain. 10/10 pain upon EMS assessment, given 150 mcg fentanyl. Pain 5/10 now.

## 2014-05-16 NOTE — ED Notes (Signed)
TLSO not in house, vendor [employer] will distribute to ED.

## 2014-05-16 NOTE — ED Notes (Signed)
Assist with Cervical Placement.

## 2014-05-16 NOTE — ED Notes (Signed)
[employer] [facility] [employer].

## 2014-05-16 NOTE — ED Notes (Signed)
Myself and Sarah, RN undressed pt, placed in gown, on monitor, continuous pulse oximetry, blood pressure cuff and oxygen Patrick (2L)

## 2014-05-16 NOTE — ED Notes (Signed)
Flow called to inquire about bed request, EDP and Resident states they do no need the pt to be admitted at this time. Bed assignment removed.

## 2014-05-17 ENCOUNTER — Inpatient Hospital Stay (HOSPITAL_COMMUNITY): Payer: BLUE CROSS/BLUE SHIELD

## 2014-05-17 MED ORDER — HYDROMORPHONE HCL 1 MG/ML IJ SOLN
1.0000 mg | Freq: Once | INTRAMUSCULAR | Status: DC
  Filled 2014-05-17: qty 1

## 2014-05-18 ENCOUNTER — Telehealth: Payer: Self-pay

## 2014-05-18 ENCOUNTER — Telehealth: Payer: Self-pay | Admitting: Physician Assistant

## 2014-05-18 NOTE — Telephone Encounter (Signed)
Patient seen in ER for fall with possible L4 Fracture. Please call patient and assess if he has appointment with Dr. Saintclair Halsted this week (Neurosurgery). If not, he needs to see me in clinic for ER follow-up before Friday.         Thank you!    ----- Message -----     From: SYSTEM     Sent: 05/17/2014  5:54 AM      To: Brunetta Jeans, PA-C    Called patient and he states that he called Dr. Windy Carina office this morning and is awaiting a callback from them. I informed him that Einar Pheasant would like to see him this week if he cannot get into see Dr. Saintclair Halsted. Patient would need to be seen in a 30 minute office slot.

## 2014-05-18 NOTE — Telephone Encounter (Signed)
Charles Grimes spouse [phone]  [name] called to let you know that [name] has an appointment with Dr Saintclair Halsted at 12:30 tomorrow.

## 2014-05-19 NOTE — Telephone Encounter (Signed)
Ok thanks 

## 2014-06-10 ENCOUNTER — Encounter: Payer: Self-pay | Admitting: Physician Assistant

## 2014-06-10 ENCOUNTER — Ambulatory Visit (INDEPENDENT_AMBULATORY_CARE_PROVIDER_SITE_OTHER): Payer: BC Managed Care – PPO | Admitting: *Deleted

## 2014-06-10 ENCOUNTER — Ambulatory Visit (INDEPENDENT_AMBULATORY_CARE_PROVIDER_SITE_OTHER): Payer: BC Managed Care – PPO | Admitting: Physician Assistant

## 2014-06-10 VITALS — BP 112/78 | HR 77 | Temp 98.0°F | Resp 16 | Ht 72.0 in | Wt 241.8 lb

## 2014-06-10 DIAGNOSIS — Z0000 Encounter for general adult medical examination without abnormal findings: Secondary | ICD-10-CM | POA: Insufficient documentation

## 2014-06-10 DIAGNOSIS — F419 Anxiety disorder, unspecified: Secondary | ICD-10-CM

## 2014-06-10 DIAGNOSIS — Z125 Encounter for screening for malignant neoplasm of prostate: Secondary | ICD-10-CM | POA: Insufficient documentation

## 2014-06-10 DIAGNOSIS — Z23 Encounter for immunization: Secondary | ICD-10-CM

## 2014-06-10 DIAGNOSIS — I10 Essential (primary) hypertension: Secondary | ICD-10-CM

## 2014-06-10 DIAGNOSIS — E785 Hyperlipidemia, unspecified: Secondary | ICD-10-CM

## 2014-06-10 LAB — URINALYSIS, ROUTINE W REFLEX MICROSCOPIC
Bilirubin Urine: NEGATIVE
Ketones, ur: NEGATIVE
Leukocytes, UA: NEGATIVE
NITRITE: NEGATIVE
TOTAL PROTEIN, URINE-UPE24: NEGATIVE
Urine Glucose: NEGATIVE
Urobilinogen, UA: 0.2 (ref 0.0–1.0)
pH: 5.5 (ref 5.0–8.0)

## 2014-06-10 LAB — CBC
HCT: 46.7 % (ref 39.0–52.0)
Hemoglobin: 15.6 g/dL (ref 13.0–17.0)
MCHC: 33.5 g/dL (ref 30.0–36.0)
MCV: 87.6 fl (ref 78.0–100.0)
Platelets: 220 10*3/uL (ref 150.0–400.0)
RBC: 5.33 Mil/uL (ref 4.22–5.81)
RDW: 13.3 % (ref 11.5–15.5)
WBC: 11.3 10*3/uL — ABNORMAL HIGH (ref 4.0–10.5)

## 2014-06-10 LAB — HEPATIC FUNCTION PANEL
ALT: 16 U/L (ref 0–53)
AST: 23 U/L (ref 0–37)
Albumin: 3.7 g/dL (ref 3.5–5.2)
Alkaline Phosphatase: 151 U/L — ABNORMAL HIGH (ref 39–117)
BILIRUBIN TOTAL: 0.7 mg/dL (ref 0.2–1.2)
Bilirubin, Direct: 0.1 mg/dL (ref 0.0–0.3)
Total Protein: 7.1 g/dL (ref 6.0–8.3)

## 2014-06-10 LAB — BASIC METABOLIC PANEL WITH GFR
BUN: 20 mg/dL (ref 6–23)
CO2: 19 mEq/L (ref 19–32)
Calcium: 8.9 mg/dL (ref 8.4–10.5)
Chloride: 105 mEq/L (ref 96–112)
Creat: 0.82 mg/dL (ref 0.50–1.35)
GLUCOSE: 94 mg/dL (ref 70–99)
Potassium: 4.1 mEq/L (ref 3.5–5.3)
SODIUM: 138 meq/L (ref 135–145)

## 2014-06-10 LAB — PSA: PSA: 0.51 ng/mL (ref 0.10–4.00)

## 2014-06-10 LAB — HEMOGLOBIN A1C: HEMOGLOBIN A1C: 6 % (ref 4.6–6.5)

## 2014-06-10 LAB — TSH: TSH: 1.76 u[IU]/mL (ref 0.35–4.50)

## 2014-06-10 MED ORDER — NITROGLYCERIN 0.4 MG SL SUBL
0.4000 mg | SUBLINGUAL_TABLET | SUBLINGUAL | Status: DC | PRN
Start: 2014-06-10 — End: 2016-05-30

## 2014-06-10 MED ORDER — PRAVASTATIN SODIUM 20 MG PO TABS: 20.0000 mg | ORAL_TABLET | Freq: Every day | ORAL | Status: DC

## 2014-06-10 MED ORDER — SERTRALINE HCL 50 MG PO TABS: 75.0000 mg | ORAL_TABLET | Freq: Every day | ORAL | Status: DC

## 2014-06-10 MED ORDER — DIAZEPAM 5 MG PO TABS: 5.0000 mg | ORAL_TABLET | Freq: Every day | ORAL | Status: DC

## 2014-06-10 NOTE — Assessment & Plan Note (Signed)
Patient defers DRE.  Risks vs benefits of PSA testing discussed with patient.  Will proceed with PSA today.

## 2014-06-10 NOTE — Assessment & Plan Note (Signed)
Well-controlled.  Continue current regimen. Follow-up with Cardiology as scheduled. 

## 2014-06-10 NOTE — Assessment & Plan Note (Signed)
I have reviewed the patient's medical history in detail and updated the computerized patient record.  Health Maintenance is up to date now that flu shot has been given.  Will obtain fasting labs. Preventive care discussed with patient.  Handout given.

## 2014-06-10 NOTE — Progress Notes (Signed)
Pre visit review using our clinic review tool, if applicable. No additional management support is needed unless otherwise documented below in the visit note. 

## 2014-06-10 NOTE — Assessment & Plan Note (Signed)
Doing very well with 50 mg Zoloft.  Rx refilled.  Continue current regimen.  Follow-up in 6 months.

## 2014-06-10 NOTE — Patient Instructions (Signed)
Please continue medications as directed.  Go to the lab for blood work. I will call you with your results.  Follow-up with the Orthopedic Surgeon as scheduled.  Preventive Care for Adults A healthy lifestyle and preventive care can promote health and wellness. Preventive health guidelines for men include the following key practices:  A routine yearly physical is a good way to check with your health care provider about your health and preventative screening. It is a chance to share any concerns and updates on your health and to receive a thorough exam.  Visit your dentist for a routine exam and preventative care every 6 months. Brush your teeth twice a day and floss once a day. Good oral hygiene prevents tooth decay and gum disease.  The frequency of eye exams is based on your age, health, family medical history, use of contact lenses, and other factors. Follow your health care provider's recommendations for frequency of eye exams.  Eat a healthy diet. Foods such as vegetables, fruits, whole grains, low-fat dairy products, and lean protein foods contain the nutrients you need without too many calories. Decrease your intake of foods high in solid fats, added sugars, and salt. Eat the right amount of calories for you.Get information about a proper diet from your health care provider, if necessary.  Regular physical exercise is one of the most important things you can do for your health. Most adults should get at least 150 minutes of moderate-intensity exercise (any activity that increases your heart rate and causes you to sweat) each week. In addition, most adults need muscle-strengthening exercises on 2 or more days a week.  Maintain a healthy weight. The body mass index (BMI) is a screening tool to identify possible weight problems. It provides an estimate of body fat based on height and weight. Your health care provider can find your BMI and can help you achieve or maintain a healthy weight.For  adults 20 years and older:  A BMI below 18.5 is considered underweight.  A BMI of 18.5 to 24.9 is normal.  A BMI of 25 to 29.9 is considered overweight.  A BMI of 30 and above is considered obese.  Maintain normal blood lipids and cholesterol levels by exercising and minimizing your intake of saturated fat. Eat a balanced diet with plenty of fruit and vegetables. Blood tests for lipids and cholesterol should begin at age 65 and be repeated every 5 years. If your lipid or cholesterol levels are high, you are over 50, or you are at high risk for heart disease, you may need your cholesterol levels checked more frequently.Ongoing high lipid and cholesterol levels should be treated with medicines if diet and exercise are not working.  If you smoke, find out from your health care provider how to quit. If you do not use tobacco, do not start.  Lung cancer screening is recommended for adults aged 10-80 years who are at high risk for developing lung cancer because of a history of smoking. A yearly low-dose CT scan of the lungs is recommended for people who have at least a 30-pack-year history of smoking and are a current smoker or have quit within the past 15 years. A pack year of smoking is smoking an average of 1 pack of cigarettes a day for 1 year (for example: 1 pack a day for 30 years or 2 packs a day for 15 years). Yearly screening should continue until the smoker has stopped smoking for at least 15 years. Yearly screening should be  stopped for people who develop a health problem that would prevent them from having lung cancer treatment.  If you choose to drink alcohol, do not have more than 2 drinks per day. One drink is considered to be 12 ounces (355 mL) of beer, 5 ounces (148 mL) of wine, or 1.5 ounces (44 mL) of liquor.  Avoid use of street drugs. Do not share needles with anyone. Ask for help if you need support or instructions about stopping the use of drugs.  High blood pressure causes  heart disease and increases the risk of stroke. Your blood pressure should be checked at least every 1-2 years. Ongoing high blood pressure should be treated with medicines, if weight loss and exercise are not effective.  If you are 50-45 years old, ask your health care provider if you should take aspirin to prevent heart disease.  Diabetes screening involves taking a blood sample to check your fasting blood sugar level. This should be done once every 3 years, after age 58, if you are within normal weight and without risk factors for diabetes. Testing should be considered at a younger age or be carried out more frequently if you are overweight and have at least 1 risk factor for diabetes.  Colorectal cancer can be detected and often prevented. Most routine colorectal cancer screening begins at the age of 54 and continues through age 60. However, your health care provider may recommend screening at an earlier age if you have risk factors for colon cancer. On a yearly basis, your health care provider may provide home test kits to check for hidden blood in the stool. Use of a small camera at the end of a tube to directly examine the colon (sigmoidoscopy or colonoscopy) can detect the earliest forms of colorectal cancer. Talk to your health care provider about this at age 22, when routine screening begins. Direct exam of the colon should be repeated every 5-10 years through age 55, unless early forms of precancerous polyps or small growths are found.  People who are at an increased risk for hepatitis B should be screened for this virus. You are considered at high risk for hepatitis B if:  You were born in a country where hepatitis B occurs often. Talk with your health care provider about which countries are considered high risk.  Your parents were born in a high-risk country and you have not received a shot to protect against hepatitis B (hepatitis B vaccine).  You have HIV or AIDS.  You use needles to  inject street drugs.  You live with, or have sex with, someone who has hepatitis B.  You are a man who has sex with other men (MSM).  You get hemodialysis treatment.  You take certain medicines for conditions such as cancer, organ transplantation, and autoimmune conditions.  Hepatitis C blood testing is recommended for all people born from [birth year] through 1965 and any individual with known risks for hepatitis C.  Practice safe sex. Use condoms and avoid high-risk sexual practices to reduce the spread of sexually transmitted infections (STIs). STIs include gonorrhea, chlamydia, syphilis, trichomonas, herpes, HPV, and human immunodeficiency virus (HIV). Herpes, HIV, and HPV are viral illnesses that have no cure. They can result in disability, cancer, and death.  If you are at risk of being infected with HIV, it is recommended that you take a prescription medicine daily to prevent HIV infection. This is called preexposure prophylaxis (PrEP). You are considered at risk if:  You are a man  who has sex with other men (MSM) and have other risk factors.  You are a heterosexual man, are sexually active, and are at increased risk for HIV infection.  You take drugs by injection.  You are sexually active with a partner who has HIV.  Talk with your health care provider about whether you are at high risk of being infected with HIV. If you choose to begin PrEP, you should first be tested for HIV. You should then be tested every 3 months for as long as you are taking PrEP.  A one-time screening for abdominal aortic aneurysm (AAA) and surgical repair of large AAAs by ultrasound are recommended for men ages 73 to 33 years who are current or former smokers.  Healthy men should no longer receive prostate-specific antigen (PSA) blood tests as part of routine cancer screening. Talk with your health care provider about prostate cancer screening.  Testicular cancer screening is not recommended for adult males who  have no symptoms. Screening includes self-exam, a health care provider exam, and other screening tests. Consult with your health care provider about any symptoms you have or any concerns you have about testicular cancer.  Use sunscreen. Apply sunscreen liberally and repeatedly throughout the day. You should seek shade when your shadow is shorter than you. Protect yourself by wearing long sleeves, pants, a wide-brimmed hat, and sunglasses year round, whenever you are outdoors.  Once a month, do a whole-body skin exam, using a mirror to look at the skin on your back. Tell your health care provider about new moles, moles that have irregular borders, moles that are larger than a pencil eraser, or moles that have changed in shape or color.  Stay current with required vaccines (immunizations).  Influenza vaccine. All adults should be immunized every year.  Tetanus, diphtheria, and acellular pertussis (Td, Tdap) vaccine. An adult who has not previously received Tdap or who does not know his vaccine status should receive 1 dose of Tdap. This initial dose should be followed by tetanus and diphtheria toxoids (Td) booster doses every 10 years. Adults with an unknown or incomplete history of completing a 3-dose immunization series with Td-containing vaccines should begin or complete a primary immunization series including a Tdap dose. Adults should receive a Td booster every 10 years.  Varicella vaccine. An adult without evidence of immunity to varicella should receive 2 doses or a second dose if he has previously received 1 dose.  Human papillomavirus (HPV) vaccine. Males aged 43-21 years who have not received the vaccine previously should receive the 3-dose series. Males aged 22-26 years may be immunized. Immunization is recommended through the age of 88 years for any male who has sex with males and did not get any or all doses earlier. Immunization is recommended for any person with an immunocompromised  condition through the age of 42 years if he did not get any or all doses earlier. During the 3-dose series, the second dose should be obtained 4-8 weeks after the first dose. The third dose should be obtained 24 weeks after the first dose and 16 weeks after the second dose.  Zoster vaccine. One dose is recommended for adults aged 41 years or older unless certain conditions are present.  Measles, mumps, and rubella (MMR) vaccine. Adults born before [birth year] generally are considered immune to measles and mumps. Adults born in [birth year] or later should have 1 or more doses of MMR vaccine unless there is a contraindication to the vaccine or there is laboratory evidence  of immunity to each of the three diseases. A routine second dose of MMR vaccine should be obtained at least 28 days after the first dose for students attending postsecondary schools, health care workers, or international travelers. People who received inactivated measles vaccine or an unknown type of measles vaccine during 1963-1967 should receive 2 doses of MMR vaccine. People who received inactivated mumps vaccine or an unknown type of mumps vaccine before 1979 and are at high risk for mumps infection should consider immunization with 2 doses of MMR vaccine. Unvaccinated health care workers born before [birth year] who lack laboratory evidence of measles, mumps, or rubella immunity or laboratory confirmation of disease should consider measles and mumps immunization with 2 doses of MMR vaccine or rubella immunization with 1 dose of MMR vaccine.  Pneumococcal 13-valent conjugate (PCV13) vaccine. When indicated, a person who is uncertain of his immunization history and has no record of immunization should receive the PCV13 vaccine. An adult aged 35 years or older who has certain medical conditions and has not been previously immunized should receive 1 dose of PCV13 vaccine. This PCV13 should be followed with a dose of pneumococcal polysaccharide (PPSV23) vaccine. The  PPSV23 vaccine dose should be obtained at least 8 weeks after the dose of PCV13 vaccine. An adult aged 57 years or older who has certain medical conditions and previously received 1 or more doses of PPSV23 vaccine should receive 1 dose of PCV13. The PCV13 vaccine dose should be obtained 1 or more years after the last PPSV23 vaccine dose.  Pneumococcal polysaccharide (PPSV23) vaccine. When PCV13 is also indicated, PCV13 should be obtained first. All adults aged 80 years and older should be immunized. An adult younger than age 14 years who has certain medical conditions should be immunized. Any person who resides in a nursing home or long-term care facility should be immunized. An adult smoker should be immunized. People with an immunocompromised condition and certain other conditions should receive both PCV13 and PPSV23 vaccines. People with human immunodeficiency virus (HIV) infection should be immunized as soon as possible after diagnosis. Immunization during chemotherapy or radiation therapy should be avoided. Routine use of PPSV23 vaccine is not recommended for American Indians, [place] Natives, or people younger than 65 years unless there are medical conditions that require PPSV23 vaccine. When indicated, people who have unknown immunization and have no record of immunization should receive PPSV23 vaccine. One-time revaccination 5 years after the first dose of PPSV23 is recommended for people aged 19-64 years who have chronic kidney failure, nephrotic syndrome, asplenia, or immunocompromised conditions. People who received 1-2 doses of PPSV23 before age 56 years should receive another dose of PPSV23 vaccine at age 63 years or later if at least 5 years have passed since the previous dose. Doses of PPSV23 are not needed for people immunized with PPSV23 at or after age 5 years.  Meningococcal vaccine. Adults with asplenia or persistent complement component deficiencies should receive 2 doses of quadrivalent  meningococcal conjugate (MenACWY-D) vaccine. The doses should be obtained at least 2 months apart. Microbiologists working with certain meningococcal bacteria, [employer] recruits, people at risk during an outbreak, and people who travel to or live in countries with a high rate of meningitis should be immunized. A first-year college student up through age 69 years who is living in a residence hall should receive a dose if he did not receive a dose on or after his 16th birthday. Adults who have certain high-risk conditions should receive one or more doses of  vaccine.  Hepatitis A vaccine. Adults who wish to be protected from this disease, have certain high-risk conditions, work with hepatitis A-infected animals, work in hepatitis A research labs, or travel to or work in countries with a high rate of hepatitis A should be immunized. Adults who were previously unvaccinated and who anticipate close contact with an international adoptee during the first 60 days after arrival in the [place] from a country with a high rate of hepatitis A should be immunized.  Hepatitis B vaccine. Adults should be immunized if they wish to be protected from this disease, have certain high-risk conditions, may be exposed to blood or other infectious body fluids, are household contacts or sex partners of hepatitis B positive people, are clients or workers in certain care facilities, or travel to or work in countries with a high rate of hepatitis B.  Haemophilus influenzae type b (Hib) vaccine. A previously unvaccinated person with asplenia or sickle cell disease or having a scheduled splenectomy should receive 1 dose of Hib vaccine. Regardless of previous immunization, a recipient of a hematopoietic stem cell transplant should receive a 3-dose series 6-12 months after his successful transplant. Hib vaccine is not recommended for adults with HIV infection. Preventive Service / Frequency Ages 52 to 40  Blood pressure check.** /  Every 1 to 2 years.  Lipid and cholesterol check.** / Every 5 years beginning at age 19.  Hepatitis C blood test.** / For any individual with known risks for hepatitis C.  Skin self-exam. / Monthly.  Influenza vaccine. / Every year.  Tetanus, diphtheria, and acellular pertussis (Tdap, Td) vaccine.** / Consult your health care provider. 1 dose of Td every 10 years.  Varicella vaccine.** / Consult your health care provider.  HPV vaccine. / 3 doses over 6 months, if 26 or younger.  Measles, mumps, rubella (MMR) vaccine.** / You need at least 1 dose of MMR if you were born in 1957 or later. You may also need a second dose.  Pneumococcal 13-valent conjugate (PCV13) vaccine.** / Consult your health care provider.  Pneumococcal polysaccharide (PPSV23) vaccine.** / 1 to 2 doses if you smoke cigarettes or if you have certain conditions.  Meningococcal vaccine.** / 1 dose if you are age 39 to 94 years and a Market researcher living in a residence hall, or have one of several medical conditions. You may also need additional booster doses.  Hepatitis A vaccine.** / Consult your health care provider.  Hepatitis B vaccine.** / Consult your health care provider.  Haemophilus influenzae type b (Hib) vaccine.** / Consult your health care provider. Ages 20 to 38  Blood pressure check.** / Every 1 to 2 years.  Lipid and cholesterol check.** / Every 5 years beginning at age 15.  Lung cancer screening. / Every year if you are aged 78-80 years and have a 30-pack-year history of smoking and currently smoke or have quit within the past 15 years. Yearly screening is stopped once you have quit smoking for at least 15 years or develop a health problem that would prevent you from having lung cancer treatment.  Fecal occult blood test (FOBT) of stool. / Every year beginning at age 73 and continuing until age 80. You may not have to do this test if you get a colonoscopy every 10 years.  Flexible  sigmoidoscopy** or colonoscopy.** / Every 5 years for a flexible sigmoidoscopy or every 10 years for a colonoscopy beginning at age 84 and continuing until age 52.  Hepatitis C  blood test.** / For all people born from [birth year] through 1965 and any individual with known risks for hepatitis C.  Skin self-exam. / Monthly.  Influenza vaccine. / Every year.  Tetanus, diphtheria, and acellular pertussis (Tdap/Td) vaccine.** / Consult your health care provider. 1 dose of Td every 10 years.  Varicella vaccine.** / Consult your health care provider.  Zoster vaccine.** / 1 dose for adults aged 44 years or older.  Measles, mumps, rubella (MMR) vaccine.** / You need at least 1 dose of MMR if you were born in 1957 or later. You may also need a second dose.  Pneumococcal 13-valent conjugate (PCV13) vaccine.** / Consult your health care provider.  Pneumococcal polysaccharide (PPSV23) vaccine.** / 1 to 2 doses if you smoke cigarettes or if you have certain conditions.  Meningococcal vaccine.** / Consult your health care provider.  Hepatitis A vaccine.** / Consult your health care provider.  Hepatitis B vaccine.** / Consult your health care provider.  Haemophilus influenzae type b (Hib) vaccine.** / Consult your health care provider. Ages 41 and over  Blood pressure check.** / Every 1 to 2 years.  Lipid and cholesterol check.**/ Every 5 years beginning at age 35.  Lung cancer screening. / Every year if you are aged 43-80 years and have a 30-pack-year history of smoking and currently smoke or have quit within the past 15 years. Yearly screening is stopped once you have quit smoking for at least 15 years or develop a health problem that would prevent you from having lung cancer treatment.  Fecal occult blood test (FOBT) of stool. / Every year beginning at age 73 and continuing until age 68. You may not have to do this test if you get a colonoscopy every 10 years.  Flexible sigmoidoscopy** or  colonoscopy.** / Every 5 years for a flexible sigmoidoscopy or every 10 years for a colonoscopy beginning at age 26 and continuing until age 33.  Hepatitis C blood test.** / For all people born from [birth year] through 1964 and any individual with known risks for hepatitis C.  Abdominal aortic aneurysm (AAA) screening.** / A one-time screening for ages 7 to 104 years who are current or former smokers.  Skin self-exam. / Monthly.  Influenza vaccine. / Every year.  Tetanus, diphtheria, and acellular pertussis (Tdap/Td) vaccine.** / 1 dose of Td every 10 years.  Varicella vaccine.** / Consult your health care provider.  Zoster vaccine.** / 1 dose for adults aged 38 years or older.  Pneumococcal 13-valent conjugate (PCV13) vaccine.** / Consult your health care provider.  Pneumococcal polysaccharide (PPSV23) vaccine.** / 1 dose for all adults aged 22 years and older.  Meningococcal vaccine.** / Consult your health care provider.  Hepatitis A vaccine.** / Consult your health care provider.  Hepatitis B vaccine.** / Consult your health care provider.  Haemophilus influenzae type b (Hib) vaccine.** / Consult your health care provider. **Family history and personal history of risk and conditions may change your health care provider's recommendations. Document Released: 08/29/2001 Document Revised: 07/08/2013 Document Reviewed: 11/28/2010 [facility] Patient Information 2015 [place], [place]. This information is not intended to replace advice given to you by your health care provider. Make sure you discuss any questions you have with your health care provider.

## 2014-06-10 NOTE — Progress Notes (Signed)
Patient presents to clinic today for annual exam.  Patient is fasting for labs.  Acute Concerns: No acute concerns at today's visit.  Chronic Issues: Hypertension -- Well-controlled.  Denies chest pain, palpitations,lightheadedness or dizziness.  BP: 112/78 mmHg   Anxiety -- Well controlled with Zoloft 50 mg daily and PRN Valium.  Patient denies panic attack, anhedonia or depressed mood.  Needs refill of medication.  Health Maintenance: Dental -- overdue Vision -- up-to-date Immunizations -- up-to-date. Flu shot given today. Colonoscopy -- up-to-date  Past Medical History  Diagnosis Date  . Skin disorder      recently diagnosed, hands, resolved  . Hypertension   . Obesity (BMI 30.0-34.9)   . Family history of premature CAD   . Cardiomyopathy 05/2013    As yet unclear Etiology - EF ~45% by Echo & Myoview  . Dyslipidemia   . Coronary artery disease   . Anxiety   . Depression   . GERD (gastroesophageal reflux disease)     Past Surgical History  Procedure Laterality Date  . Nm myoview ltd  06/03/2013    Low risk study with no ischemia. EF roughly 44% with no regional wall motion abnormalities noted  . Transthoracic echocardiogram  06/11/2013    Mildly reduced EF: 45-50%. Mild anterior hypokinesis with incoordinate septal motion. No evidence of pulmonary hypertension  . Lower extremity arterial dopplers  06/11/2013    No occlusive disease  . Pfts  06/2013    Normal Volumes &  Spirometry; Moderately reduced DLCO  . Cardiac catheterization  August 26 2013    Mild to moderate disease with 40-50% stenosis and OM1. Otherwise no significant CAD    Current Outpatient Prescriptions on File Prior to Visit  Medication Sig Dispense Refill  . acetaminophen (TYLENOL) 325 MG tablet Take 650 mg by mouth as needed for mild pain.     Marland Kitchen aspirin EC 81 MG tablet Take 81 mg by mouth daily.    Marland Kitchen losartan (COZAAR) 50 MG tablet Take 50 mg by mouth daily.    Marland Kitchen omeprazole (PRILOSEC OTC)  20 MG tablet Take 20 mg by mouth daily.     No current facility-administered medications on file prior to visit.    Allergies  Allergen Reactions  . Isosorbide Other (See Comments)    CONTINUOUS HEADACHE   . Penicillins Hives and Rash    Family History  Problem Relation Age of Onset  . Atrial fibrillation Mother     Alive at 18  . COPD Mother   . Hypertension Mother   . Hypertension Father     Alive at 63  . Lung cancer Father     Chronic smoker  . Factor V Leiden deficiency Father     Also Lupus anticoagulant  . Heart attack Maternal Grandmother 48  . Heart failure Paternal Grandmother   . Diabetes Paternal Grandmother   . Heart attack Brother 74    X2  . Heart attack Sister 72    Deceased  . Healthy Sister     x2  . Healthy Son     x2  . Colon cancer Neg Hx   . Esophageal cancer Neg Hx   . Rectal cancer Neg Hx   . Stomach cancer Neg Hx     History   Social History  . Marital Status: Married    Spouse Name: N/A    Number of Children: N/A  . Years of Education: N/A   Occupational History  . Not on file.  Social History Main Topics  . Smoking status: Former Smoker -- 1.00 packs/day    Types: Cigarettes    Quit date: 08/11/2013  . Smokeless tobacco: Never Used  . Alcohol Use: Yes     Comment: rarely  . Drug Use: No  . Sexual Activity: Not on file   Other Topics Concern  . Not on file   Social History Narrative   Married father of 2 boys. Lives with wife, [name], and one son.   He works as a Designer, television/film set for [employer]   He currently smokes one pack a day, cut down from 2 packs per day.   Only occasional beer and mixed drinks.   Does not exercise because he is too tired.   Review of Systems  Constitutional: Negative for fever and weight loss.  HENT: Negative for ear discharge, ear pain, hearing loss and tinnitus.   Eyes: Negative for blurred vision, double vision, photophobia and pain.    Respiratory: Negative for cough and shortness of breath.   Cardiovascular: Negative for chest pain and palpitations.  Gastrointestinal: Negative for heartburn, nausea, vomiting, abdominal pain, diarrhea, constipation, blood in stool and melena.  Genitourinary: Negative for dysuria, urgency, frequency, hematuria and flank pain.  Musculoskeletal: Positive for back pain.  Skin: Negative for rash.  Neurological: Negative for dizziness, loss of consciousness and headaches.  Psychiatric/Behavioral: Negative for depression. The patient is not nervous/anxious and does not have insomnia.    BP 112/78 mmHg  Pulse 77  Temp(Src) 98 F (36.7 C) (Oral)  Resp 16  Ht 6' (1.829 m)  Wt 241 lb 12.8 oz (109.68 kg)  BMI 32.79 kg/m2  Physical Exam  Constitutional: He is oriented to person, place, and time and well-developed, well-nourished, and in no distress.  HENT:  Head: Normocephalic and atraumatic.  Right Ear: External ear normal.  Left Ear: External ear normal.  Nose: Nose normal.  Mouth/Throat: Oropharynx is clear and moist. No oropharyngeal exudate.  TM within normal limits bilaterally.  Eyes: Conjunctivae and EOM are normal. Pupils are equal, round, and reactive to light.  Neck: Neck supple. No thyromegaly present.  Cardiovascular: Normal rate, regular rhythm, normal heart sounds and intact distal pulses.   Pulmonary/Chest: Effort normal and breath sounds normal. No respiratory distress. He has no wheezes. He has no rales. He exhibits no tenderness.  Abdominal: Soft. Bowel sounds are normal. He exhibits no distension and no mass. There is no tenderness. There is no rebound and no guarding.  Lymphadenopathy:    He has no cervical adenopathy.  Neurological: He is alert and oriented to person, place, and time. He has normal reflexes.  Skin: Skin is warm and dry. No rash noted.  Psychiatric: Affect normal.  Vitals reviewed.   Recent Results (from the past 2160 hour(s))  Comprehensive  metabolic panel     Status: None   Collection Time: 05/16/14  6:20 PM  Result Value Ref Range   Sodium 141 137 - 147 mEq/L   Potassium 4.4 3.7 - 5.3 mEq/L   Chloride 106 96 - 112 mEq/L   CO2 24 19 - 32 mEq/L   Glucose, Bld 94 70 - 99 mg/dL   BUN 19 6 - 23 mg/dL   Creatinine, Ser 0.91 0.50 - 1.35 mg/dL   Calcium 8.8 8.4 - 10.5 mg/dL   Total Protein 7.2 6.0 - 8.3 g/dL   Albumin 3.5 3.5 - 5.2 g/dL   AST 26 0 - 37 U/L  Comment: HEMOLYSIS AT THIS LEVEL MAY AFFECT RESULT   ALT 20 0 - 53 U/L   Alkaline Phosphatase 99 39 - 117 U/L   Total Bilirubin 0.3 0.3 - 1.2 mg/dL   GFR calc non Af Amer >90 >90 mL/min   GFR calc Af Amer >90 >90 mL/min    Comment: (NOTE) The eGFR has been calculated using the CKD EPI equation. This calculation has not been validated in all clinical situations. eGFR's persistently <90 mL/min signify possible Chronic Kidney Disease.   Anion gap 11 5 - 15  CBC     Status: Abnormal   Collection Time: 05/16/14  6:20 PM  Result Value Ref Range   WBC 10.8 (H) 4.0 - 10.5 K/uL   RBC 4.82 4.22 - 5.81 MIL/uL   Hemoglobin 14.4 13.0 - 17.0 g/dL   HCT 42.8 39.0 - 52.0 %   MCV 88.8 78.0 - 100.0 fL   MCH 29.9 26.0 - 34.0 pg   MCHC 33.6 30.0 - 36.0 g/dL   RDW 13.5 11.5 - 15.5 %   Platelets 186 150 - 400 K/uL  Ethanol     Status: None   Collection Time: 05/16/14  6:20 PM  Result Value Ref Range   Alcohol, Ethyl (B) <11 0 - 11 mg/dL    Comment:        LOWEST DETECTABLE LIMIT FOR SERUM ALCOHOL IS 11 mg/dL FOR MEDICAL PURPOSES ONLY  Sample to Blood Bank     Status: None   Collection Time: 05/16/14  6:20 PM  Result Value Ref Range   Blood Bank Specimen SAMPLE AVAILABLE FOR TESTING    Sample Expiration 05/17/2014   Lipase, blood     Status: None   Collection Time: 05/16/14  6:20 PM  Result Value Ref Range   Lipase 37 11 - 59 U/L  I-Stat CG4 Lactic Acid, ED     Status: None   Collection Time: 05/16/14  6:33 PM  Result Value Ref Range   Lactic Acid, Venous 0.55 0.5 -  2.2 mmol/L    Assessment/Plan: Hypertension Well controlled.  Continue current regimen.  Follow-up with Cardiology as scheduled.  Anxiety Doing very well with 50 mg Zoloft.  Rx refilled.  Continue current regimen.  Follow-up in 6 months.  Prostate cancer screening Patient defers DRE.  Risks vs benefits of PSA testing discussed with patient.  Will proceed with PSA today.  Visit for preventive health examination I have reviewed the patient's medical history in detail and updated the computerized patient record.  Health Maintenance is up to date now that flu shot has been given.  Will obtain fasting labs. Preventive care discussed with patient.  Handout given.

## 2014-06-11 LAB — LIPID PANEL W/DIRECT LDL/HDL RATIO
CHOL/HDL RATIO: 5.2 ratio
Cholesterol: 167 mg/dL (ref 0–200)
Direct LDL: 104 mg/dL — ABNORMAL HIGH
HDL: 32 mg/dL — AB (ref >39)
LDL:HDL Ratio: 3.3 Ratio
Triglycerides: 158 mg/dL — ABNORMAL HIGH (ref <150)

## 2014-06-17 ENCOUNTER — Telehealth: Payer: Self-pay | Admitting: *Deleted

## 2014-06-17 DIAGNOSIS — R748 Abnormal levels of other serum enzymes: Secondary | ICD-10-CM

## 2014-06-17 NOTE — Telephone Encounter (Signed)
Patient Demographics     Patient Name Sex DOB SSN Address    Charles, Grimes Male 1955-10-18 [id number] [place] [address]    Phone: [phone] [facility]) [phone] (Work)      Message  Received: 2 days ago    Charles Grimes, [place]            Patient scheduled lab appointment. Please order labs

## 2014-06-17 NOTE — Telephone Encounter (Signed)
Orders have been placed.

## 2014-06-25 ENCOUNTER — Encounter (HOSPITAL_COMMUNITY): Payer: Self-pay | Admitting: Cardiology

## 2014-06-30 ENCOUNTER — Other Ambulatory Visit (INDEPENDENT_AMBULATORY_CARE_PROVIDER_SITE_OTHER): Payer: BC Managed Care – PPO

## 2014-06-30 DIAGNOSIS — R748 Abnormal levels of other serum enzymes: Secondary | ICD-10-CM

## 2014-06-30 LAB — HEPATIC FUNCTION PANEL
ALBUMIN: 3.6 g/dL (ref 3.5–5.2)
ALK PHOS: 91 U/L (ref 39–117)
ALT: 22 U/L (ref 0–53)
AST: 19 U/L (ref 0–37)
Bilirubin, Direct: 0.1 mg/dL (ref 0.0–0.3)
Total Bilirubin: 0.7 mg/dL (ref 0.2–1.2)
Total Protein: 7.1 g/dL (ref 6.0–8.3)

## 2014-07-21 ENCOUNTER — Ambulatory Visit (INDEPENDENT_AMBULATORY_CARE_PROVIDER_SITE_OTHER): Payer: 59 | Admitting: Cardiology

## 2014-07-21 ENCOUNTER — Encounter: Payer: Self-pay | Admitting: Cardiology

## 2014-07-21 VITALS — BP 115/71 | HR 73 | Ht 71.0 in | Wt 229.3 lb

## 2014-07-21 DIAGNOSIS — I10 Essential (primary) hypertension: Secondary | ICD-10-CM | POA: Diagnosis not present

## 2014-07-21 DIAGNOSIS — I428 Other cardiomyopathies: Secondary | ICD-10-CM

## 2014-07-21 DIAGNOSIS — Z87891 Personal history of nicotine dependence: Secondary | ICD-10-CM

## 2014-07-21 DIAGNOSIS — I429 Cardiomyopathy, unspecified: Secondary | ICD-10-CM

## 2014-07-21 DIAGNOSIS — E669 Obesity, unspecified: Secondary | ICD-10-CM | POA: Diagnosis not present

## 2014-07-21 DIAGNOSIS — R0602 Shortness of breath: Secondary | ICD-10-CM

## 2014-07-21 DIAGNOSIS — E785 Hyperlipidemia, unspecified: Secondary | ICD-10-CM | POA: Diagnosis not present

## 2014-07-21 DIAGNOSIS — I2510 Atherosclerotic heart disease of native coronary artery without angina pectoris: Secondary | ICD-10-CM | POA: Diagnosis not present

## 2014-07-21 MED ORDER — LOSARTAN POTASSIUM 50 MG PO TABS: 50.0000 mg | ORAL_TABLET | Freq: Every day | ORAL | Status: DC

## 2014-07-21 NOTE — Patient Instructions (Signed)
Your physician wants you to follow-up in: 1 year with Dr Harding.  You will receive a reminder letter in the mail two months in advance. If you don't receive a letter, please call our office to schedule the follow-up appointment.  

## 2014-07-22 ENCOUNTER — Encounter: Payer: Self-pay | Admitting: Cardiology

## 2014-07-22 NOTE — Progress Notes (Signed)
PCP: Leeanne Rio, PA-C  Clinic Note: Chief Complaint  Patient presents with  . 10 month visit    fractured L4 on 10/31 - no other complaints  . Cardiomyopathy    Nonischemic    HPI: Charles Grimes is a 59 y.o. male with a Cardiovascular Problem List below who presents today for ~1 yr  followup.  I first met him in the Fall of 2014 ast fall for evaluation of chest discomfort and exertional dyspnea. Interestingly, the chest discomfort was actually always at rest with exertional dyspnea.  Because of a reduced ejection fraction on Echocardiogram & Myoview, he underwent cardiac catheterization which did not show any significant CAD. His LVEDP was relatively normal.   Past Medical History  Diagnosis Date  . Dyslipidemia, goal LDL below 100   . Essential hypertension   . Obesity (BMI 30.0-34.9)   . Family history of premature CAD   . Nonischemic cardiomyopathy 05/2013    Non-ischemic: EF ~45% by Echo & Myoview --> Non-obstructive CAD  . Coronary artery disease, non-occlusive 08/2013    Mild to moderate (40% OM1) single vessel CAD. Otherwise nonobstructed.  . Anxiety   . Depression   . GERD (gastroesophageal reflux disease)   . Anxiety   . H/O skin disorder     Involving hands. Subsequently resolved.     Prior Cardiac Evaluation and Past Surgical History: Past Surgical History  Procedure Laterality Date  . Nm myoview ltd  06/03/2013    Low risk study with no ischemia. EF roughly 44% with no regional wall motion abnormalities noted  . Transthoracic echocardiogram  06/11/2013    Mildly reduced EF: 45-50%. Mild anterior hypokinesis with incoordinate septal motion. No evidence of pulmonary hypertension  . Lower extremity arterial dopplers  06/11/2013    No occlusive disease  . Pfts  06/2013    Normal Volumes &  Spirometry; Moderately reduced DLCO  . Left heart catheterization with coronary angiogram  08/26/2013    Mild to moderate disease with 40-50% stenosis and OM1.  Otherwise no significant CAD --  Surgeon: Leonie Man, MD;  Location: [facility] CATH LAB;  Service: Cardiovascular;;    Interval History: since his last visit, the main problem has been that he fell from his roof and fractured his L4 vertebrae back in October. He is just now starting to get to where he can start doing things, but really can't do too much walking around without getting pregnant painful. He has noted however, since starting on a when necessary Valium and Zoloft, his anxiety spells and associated dyspnea/palpitations.  He still has exertional dyspnea but is more noticing his back pain currently. No resting or exertional chest pain.  Initially, despite the fact that he has not really been able to exercise, he has definitely adjusted his diet and has lost weight. He has continued to do well with smoking cessation since last year at this time.  No PND, orthopnea or edema. No palpitations, lightheadedness, dizziness, weakness, syncope/near syncope, or TIA/amaurosis fugax symptoms.  ROS: A comprehensive was performed. Review of Systems  Constitutional: Positive for weight loss (Intentional). Negative for malaise/fatigue.  HENT: Negative for nosebleeds.   Respiratory: Positive for shortness of breath and wheezing. Negative for cough.   Cardiovascular: Positive for claudication.  Genitourinary: Negative for hematuria and flank pain.  Musculoskeletal: Positive for back pain and falls (None since his major fall in October).  Neurological: Negative for dizziness and seizures.  Endo/Heme/Allergies: Does not bruise/bleed easily.  Psychiatric/Behavioral: Negative  for depression. The patient is nervous/anxious (Notably improved on Zoloft and Valium).   All other systems reviewed and are negative.   Current Outpatient Prescriptions on File Prior to Visit  Medication Sig Dispense Refill  . acetaminophen (TYLENOL) 325 MG tablet Take 650 mg by mouth as needed for mild pain.     Marland Kitchen aspirin EC 81 MG  tablet Take 81 mg by mouth daily.    . diazepam (VALIUM) 5 MG tablet Take 1 tablet (5 mg total) by mouth at bedtime. 30 tablet 1  . nitroGLYCERIN (NITROSTAT) 0.4 MG SL tablet Place 1 tablet (0.4 mg total) under the tongue every 5 (five) minutes as needed for chest pain. 30 tablet 1  . omeprazole (PRILOSEC OTC) 20 MG tablet Take 20 mg by mouth daily.    Marland Kitchen oxyCODONE (ROXICODONE) 15 MG immediate release tablet Take 15 mg by mouth every 6 (six) hours as needed for pain.    . pravastatin (PRAVACHOL) 20 MG tablet Take 1 tablet (20 mg total) by mouth daily. 90 tablet 1  . sertraline (ZOLOFT) 50 MG tablet Take 1.5 tablets (75 mg total) by mouth daily. 90 tablet 1   No current facility-administered medications on file prior to visit.    Allergies  Allergen Reactions  . Isosorbide Other (See Comments)    CONTINUOUS HEADACHE   . Penicillins Hives and Rash   SOCIAL AND FAMILY HISTORY REVIEWED IN EPIC -- No change  Wt Readings from Last 3 Encounters:  07/21/14 229 lb 4.8 oz (104.01 kg)  06/10/14 241 lb 12.8 oz (109.68 kg)  05/16/14 245 lb (111.131 kg)   PHYSICAL EXAM BP 115/71 mmHg  Pulse 73  Ht '5\' 11"'  (1.803 m)  Wt 229 lb 4.8 oz (104.01 kg)  BMI 32.00 kg/m2 General appearance: alert, cooperative, appears stated age, no distress and moderately obese Neck: no adenopathy, no carotid bruit and no JVD Lungs: clear to auscultation bilaterally, normal percussion bilaterally and non-labored Heart: regular rate and rhythm, S1, S2 normal, no murmur, click, rub or gallop; nondisplaced PMI Abdomen: soft, non-tender; bowel sounds normal; no masses, no organomegaly; obese Extremities: extremities normal, atraumatic, no cyanosis or edema ; when he walked in he was wearing a back brace to stabilize his back status post fracture. Pulses: 2+ and symmetric Neurologic: Mental status: Alert, oriented, thought content appropriate; grossly normal   Adult ECG Report  Rate: 73 ;  Rhythm: normal sinus rhythm,  sinus arrhythmia and Otherwise normal EKG with normal axis, intervals, durations and voltage  Narrative Interpretation: normal EKG   ASSESSMENT / PLAN: Nonischemic cardiomyopathy - mild No real heart failure symptoms besides his exertional dyspnea which may be related to pulmonary issues long-term smoking. Not sure what the etiology for his mildly reduced EF is 20 he is not requiring any significant diuresis. He is on an ARB for afterload reduction. Not use beta blocker for history of fatigue and depression. Also concern for potentially exacerbating pulmonary symptoms.  Mild essential hypertension Excellent control. Taking moderate doses of ARB but we'll refill today.  Coronary artery disease, non-occlusive He does have cardiac risk factors: former smoker, long-term, hyperlipidemia and hypertension as well as obesity. Moderate disease only noted on catheterization: We'll continue statin and aspirin.  Dyslipidemia, goal LDL below 100 Lab Results  Component Value Date   CHOL 157 02/04/2014   HDL 32* 06/10/2014   LDLCALC 98 02/04/2014   LDLDIRECT 104* 06/10/2014   TRIG 158* 06/10/2014   CHOLHDL 5.2 06/10/2014   He is pretty  much close to goal with the exception of HDL. This can really only been improved with exercise. Consider niacin, however I doubt the effectiveness.  Shortness of breath on exertion Again probably multifactorial. Certainly be mild cardiomyopathy is playing a role as is his lung disease. It does seem to have improved with weight loss - suggesting decreased restrictive forces on his lung expansion.    No orders of the defined types were placed in this encounter.   Meds ordered this encounter  Medications  . losartan (COZAAR) 50 MG tablet    Sig: Take 1 tablet (50 mg total) by mouth daily.    Dispense:  30 tablet    Refill:  11    Followup: one year   Kaitlan Bin, Leonie Green, M.D., M.S. Interventional Cardiologist   Pager # [phone]

## 2014-07-23 NOTE — Assessment & Plan Note (Signed)
No real heart failure symptoms besides his exertional dyspnea which may be related to pulmonary issues long-term smoking. Not sure what the etiology for his mildly reduced EF is 20 he is not requiring any significant diuresis. He is on an ARB for afterload reduction. Not use beta blocker for history of fatigue and depression. Also concern for potentially exacerbating pulmonary symptoms.

## 2014-07-23 NOTE — Assessment & Plan Note (Signed)
Excellent control. Taking moderate doses of ARB but we'll refill today.

## 2014-07-23 NOTE — Assessment & Plan Note (Signed)
Lab Results  Component Value Date   CHOL 157 02/04/2014   HDL 32* 06/10/2014   LDLCALC 98 02/04/2014   LDLDIRECT 104* 06/10/2014   TRIG 158* 06/10/2014   CHOLHDL 5.2 06/10/2014   He is pretty much close to goal with the exception of HDL. This can really only been improved with exercise. Consider niacin, however I doubt the effectiveness.

## 2014-07-23 NOTE — Assessment & Plan Note (Signed)
Again probably multifactorial. Certainly be mild cardiomyopathy is playing a role as is his lung disease. It does seem to have improved with weight loss - suggesting decreased restrictive forces on his lung expansion.

## 2014-07-23 NOTE — Assessment & Plan Note (Signed)
He does have cardiac risk factors: former smoker, long-term, hyperlipidemia and hypertension as well as obesity. Moderate disease only noted on catheterization: We'll continue statin and aspirin.

## 2014-08-03 ENCOUNTER — Telehealth: Payer: Self-pay | Admitting: Physician Assistant

## 2014-08-03 DIAGNOSIS — G8929 Other chronic pain: Secondary | ICD-10-CM

## 2014-08-03 DIAGNOSIS — M549 Dorsalgia, unspecified: Principal | ICD-10-CM

## 2014-08-03 NOTE — Telephone Encounter (Signed)
Referral placed.  What other specialists is the patient seeing currently.  I see a referral to [facility] (GI) but that was only for colonoscopy.  So no referral should be needed for GI presently.

## 2014-08-03 NOTE — Telephone Encounter (Signed)
Caller name:Bogacki, carla Relation to HN:GITJLL Call back [place]:  Reason for call: pt is needing a referral to [facility], pt has to get injections in his back, states he now has [facility] and needs a referral for all specialist.

## 2014-08-04 NOTE — Telephone Encounter (Signed)
LMOM with contact name and number [for return call, if needed] RE: referral requested and further provider instructions/SLS

## 2014-09-03 ENCOUNTER — Telehealth: Payer: Self-pay | Admitting: Physician Assistant

## 2014-09-06 ENCOUNTER — Other Ambulatory Visit: Payer: Self-pay | Admitting: Physician Assistant

## 2014-09-07 ENCOUNTER — Encounter: Payer: Self-pay | Admitting: Physician Assistant

## 2014-09-07 NOTE — Telephone Encounter (Signed)
Rx refilled. Needs to pick up as he needs to sign CSC and give a UDS sample per new office policy.

## 2014-09-07 NOTE — Telephone Encounter (Signed)
Refill granted.  Will need to pick up as he is due for [facility] signature.

## 2014-09-07 NOTE — Telephone Encounter (Signed)
LMOM with contact name and number [for return call, if needed] RE: Rx must be picked up in Office at [facility] by patient per provider instructions/SLS

## 2014-09-07 NOTE — Telephone Encounter (Signed)
Medication Detail      Disp Refills Start End     diazepam (VALIUM) 5 MG tablet 30 tablet 1 06/10/2014     Sig - Route: Take 1 tablet (5 mg total) by mouth at bedtime. - Oral    Class: Print   Associated Diagnoses    Anxiety      LAST OV: 11.25.15 Please Advise on refills/SLS

## 2014-09-10 ENCOUNTER — Other Ambulatory Visit: Payer: Self-pay | Admitting: Neurosurgery

## 2014-09-22 ENCOUNTER — Encounter: Payer: Self-pay | Admitting: Cardiology

## 2014-09-24 ENCOUNTER — Telehealth: Payer: Self-pay | Admitting: *Deleted

## 2014-09-24 NOTE — Telephone Encounter (Signed)
Late entry  Faxed on 09/23/14  Clearance for L/3-4 extraforaminal microdiskectomy performed by Dr Saintclair Halsted Per Dr Ellyn Hack- low risk from a cardiac standpoint.

## 2014-09-28 ENCOUNTER — Encounter (HOSPITAL_COMMUNITY): Payer: Self-pay

## 2014-09-28 ENCOUNTER — Encounter (HOSPITAL_COMMUNITY)
Admission: RE | Admit: 2014-09-28 | Discharge: 2014-09-28 | Disposition: A | Discharge status: Home / Self Care | Payer: 59 | Source: Ambulatory Visit | Attending: Neurosurgery | Admitting: Neurosurgery

## 2014-09-28 DIAGNOSIS — Z01812 Encounter for preprocedural laboratory examination: Secondary | ICD-10-CM | POA: Diagnosis not present

## 2014-09-28 DIAGNOSIS — I2510 Atherosclerotic heart disease of native coronary artery without angina pectoris: Secondary | ICD-10-CM | POA: Insufficient documentation

## 2014-09-28 DIAGNOSIS — E785 Hyperlipidemia, unspecified: Secondary | ICD-10-CM | POA: Diagnosis not present

## 2014-09-28 DIAGNOSIS — I10 Essential (primary) hypertension: Secondary | ICD-10-CM | POA: Insufficient documentation

## 2014-09-28 LAB — CBC
HCT: 42.9 % (ref 39.0–52.0)
HEMOGLOBIN: 14.2 g/dL (ref 13.0–17.0)
MCH: 29.5 pg (ref 26.0–34.0)
MCHC: 33.1 g/dL (ref 30.0–36.0)
MCV: 89.2 fL (ref 78.0–100.0)
PLATELETS: 193 10*3/uL (ref 150–400)
RBC: 4.81 MIL/uL (ref 4.22–5.81)
RDW: 13.9 % (ref 11.5–15.5)
WBC: 9.5 10*3/uL (ref 4.0–10.5)

## 2014-09-28 LAB — BASIC METABOLIC PANEL
Anion gap: 10 (ref 5–15)
BUN: 18 mg/dL (ref 6–23)
CO2: 19 mmol/L (ref 19–32)
CREATININE: 0.86 mg/dL (ref 0.50–1.35)
Calcium: 8.7 mg/dL (ref 8.4–10.5)
Chloride: 109 mmol/L (ref 96–112)
GFR calc non Af Amer: >90 mL/min (ref >90)
GLUCOSE: 86 mg/dL (ref 70–99)
POTASSIUM: 4.1 mmol/L (ref 3.5–5.1)
Sodium: 138 mmol/L (ref 135–145)

## 2014-09-28 LAB — SURGICAL PCR SCREEN
MRSA, PCR: NEGATIVE
STAPHYLOCOCCUS AUREUS: POSITIVE — AB

## 2014-09-28 NOTE — Progress Notes (Signed)
I called a prescription for Mupirocin ointment to [facility], [address] , [place], [place]

## 2014-09-28 NOTE — Pre-Procedure Instructions (Signed)
Charles Grimes  09/28/2014   Your procedure is scheduled on:  10-07-2014  Wednesday   Report to [facility]mitting at 6:30 AM.   Call this number if you have problems the morning of surgery: [phone]   Remember:   Do not eat food or drink liquids after midnight.    Take these medicines the morning of surgery with A SIP OF WATER:tylenol as needed ,pain medication as needed ,nitroglycerin if needed,omeprazole(Prilosec),sertraline(Zoloft)   Do not wear jewelry,  Do not wear lotions, powders, or perfumes. You may  Not wear deodorant.   Do not shave 48 hours prior to surgery. Men may shave face and neck.  Do not bring valuables to the hospital.  [facility] is not responsible  for any belongings or valuables.               Contacts, dentures or bridgework may not be worn into surgery.   Leave suitcase in the car. After surgery it may be brought to your room.   For patients admitted to the hospital, discharge time is determined by your   treatment team.               Patients discharged the day of surgery will not be allowed to drive home.      Special Instructions: See attached sheet for instructions on CHG  Shower/bath   Please read over the following fact sheets that you were given: Pain Booklet and Surgical Site Infection Prevention

## 2014-09-30 NOTE — Progress Notes (Signed)
Anesthesia Chart Review:  Patient is a 59 year old male scheduled for L3-4 extra-foraminal microdiskectomy on 10/07/14 by Dr. Saintclair Halsted.  History includes former smoker, CAD with non-ischemic CM, family history of premature CAD, HTN, dyslipidemia, anxiety, depression, GERD. PCP is listed as Raiford Noble, PA-C.  Cardiologist is Dr. Ellyn Hack who cleared patient for surgery. He has not been on b-blocker therapy due to history of fatigue and depression and concern for exacerbating pulmonary symptoms.  Meds include ASA, Valium, Cozaar, Nitro, Prilosec, Pravachol, Zoloft.  07/21/14 EKG: SR with sinus arrhythmia.  08/26/13 Cardiac cath:  Left Ventriculography: EF: ~45 % Wall Motion: global hypokinesis Coronary Anatomy: Left Main: Large caliber vessel, trifurcates into LAD, Ramus Intermedius & Circumflex LAD: large caliber vessel that wraps the apex; minimal luminal irregularities. D1: small caliber vessel, minimal luminal irregularities  D2: Moderate caliber vessel, still proximal; minimal luminal irregularities Left Circumflex: Moderate to large caliber vessel that essentially becomes a single bifurcating Lateral OM. Mid vessel diffuse 40-50% luminal irregularities; not significant. Ramus intermedius: Large caliber vessel (at least as large as LAD) that reaches the apex. Angiographically normal. RCA: Large caliber, dominant vessel with 2 major RV marginal branches. It bifurcates distally into the Right Posterior Descending Artery * the Right Posterior AV Groove Branch (RPAV); angiographically normal. RPDA: Moderate caliber vessel; angiographically normal. RPL Sysytem:The RPAV begins as a moderate caliber vessel that bifurcates into 2 small to moderate caliber Right Posterolateral Branches; angiographically normal. IMPRESSION: Angiographically Mild-Moderate single vessel CAD with ~40-60% mid OM1 disease. Mildly reduced LVEF as previously noted with global hypokinesis. Upper normal LVEDP. Continue current  medication regimen.  06/11/13 Echo: - Left ventricle: The cavity size was normal. Wall thickness was normal. Systolic function was mildly reduced. The estimated ejection fraction was in the range of 45% to 50%. There appears to be mild anterior hypokinesis with incoordinate septal motion. Doppler parameters are consistent with abnormal left ventricular relaxation (grade 1 diastolic dysfunction). The E/e' ratio is <10, suggesting normal LV filling presure. - Left atrium: The atrium was normal in size. - Inferior vena cava: The vessel was normal in size; the respirophasic diameter changes were in the normal range (= 50%); findings are consistent with normal central venous pressure.  06/03/13 Nuclear stress test: Overall Impression: Low risk stress nuclear study without evidence for ischemia. LV Wall Motion: Mild global LV dysfunction, EF 44% without discreet wall motion abnormalities.  05/16/14 1V CXR: FINDINGS:Cardiac shadow is enlarged. The lungs are clear bilaterally. No acute bony abnormality is seen at this time. IMPRESSION: No acute abnormality noted.  Preoperative labs noted.   If no acute changes then I anticipate that he can proceed as planned.  George Hugh [facility]/Anesthesiology Phone [phone] 09/30/2014 2:53 PM

## 2014-10-06 MED ORDER — DEXAMETHASONE SODIUM PHOSPHATE 10 MG/ML IJ SOLN
10.0000 mg | INTRAMUSCULAR | Status: AC
  Administered 2014-10-07: 10 mg via INTRAVENOUS
  Filled 2014-10-06: qty 1

## 2014-10-06 MED ORDER — VANCOMYCIN HCL 10 G IV SOLR
1500.0000 mg | INTRAVENOUS | Status: AC
  Administered 2014-10-07: 1500 mg via INTRAVENOUS
  Filled 2014-10-06: qty 1500

## 2014-10-06 NOTE — Progress Notes (Signed)
Left message for patient to arrive at [phone]

## 2014-10-07 ENCOUNTER — Encounter (HOSPITAL_COMMUNITY): Payer: Self-pay | Admitting: Anesthesiology

## 2014-10-07 ENCOUNTER — Encounter (HOSPITAL_COMMUNITY)
Admission: RE | Disposition: A | Discharge status: Home / Self Care | Payer: Self-pay | Source: Ambulatory Visit | Attending: Neurosurgery

## 2014-10-07 ENCOUNTER — Observation Stay (HOSPITAL_COMMUNITY)
Admission: RE | Admit: 2014-10-07 | Discharge: 2014-10-08 | Disposition: A | Discharge status: Home / Self Care | Payer: 59 | Source: Ambulatory Visit | Attending: Neurosurgery | Admitting: Neurosurgery

## 2014-10-07 ENCOUNTER — Ambulatory Visit (HOSPITAL_COMMUNITY): Payer: 59 | Admitting: Vascular Surgery

## 2014-10-07 ENCOUNTER — Ambulatory Visit (HOSPITAL_COMMUNITY): Payer: 59

## 2014-10-07 ENCOUNTER — Ambulatory Visit (HOSPITAL_COMMUNITY): Payer: 59 | Admitting: Anesthesiology

## 2014-10-07 DIAGNOSIS — F329 Major depressive disorder, single episode, unspecified: Secondary | ICD-10-CM | POA: Diagnosis not present

## 2014-10-07 DIAGNOSIS — M5126 Other intervertebral disc displacement, lumbar region: Secondary | ICD-10-CM | POA: Diagnosis present

## 2014-10-07 DIAGNOSIS — Z87891 Personal history of nicotine dependence: Secondary | ICD-10-CM | POA: Insufficient documentation

## 2014-10-07 DIAGNOSIS — F419 Anxiety disorder, unspecified: Secondary | ICD-10-CM | POA: Insufficient documentation

## 2014-10-07 DIAGNOSIS — I10 Essential (primary) hypertension: Secondary | ICD-10-CM | POA: Insufficient documentation

## 2014-10-07 DIAGNOSIS — Z880 Allergy status to penicillin: Secondary | ICD-10-CM | POA: Insufficient documentation

## 2014-10-07 DIAGNOSIS — I2510 Atherosclerotic heart disease of native coronary artery without angina pectoris: Secondary | ICD-10-CM | POA: Insufficient documentation

## 2014-10-07 DIAGNOSIS — K219 Gastro-esophageal reflux disease without esophagitis: Secondary | ICD-10-CM | POA: Diagnosis not present

## 2014-10-07 DIAGNOSIS — Z79899 Other long term (current) drug therapy: Secondary | ICD-10-CM | POA: Insufficient documentation

## 2014-10-07 DIAGNOSIS — M5116 Intervertebral disc disorders with radiculopathy, lumbar region: Secondary | ICD-10-CM | POA: Diagnosis present

## 2014-10-07 DIAGNOSIS — Z885 Allergy status to narcotic agent status: Secondary | ICD-10-CM | POA: Diagnosis not present

## 2014-10-07 DIAGNOSIS — IMO0002 Reserved for concepts with insufficient information to code with codable children: Secondary | ICD-10-CM

## 2014-10-07 DIAGNOSIS — E669 Obesity, unspecified: Secondary | ICD-10-CM | POA: Diagnosis not present

## 2014-10-07 DIAGNOSIS — Z6830 Body mass index (BMI) 30.0-30.9, adult: Secondary | ICD-10-CM | POA: Insufficient documentation

## 2014-10-07 DIAGNOSIS — E785 Hyperlipidemia, unspecified: Secondary | ICD-10-CM | POA: Insufficient documentation

## 2014-10-07 DIAGNOSIS — I429 Cardiomyopathy, unspecified: Secondary | ICD-10-CM | POA: Insufficient documentation

## 2014-10-07 HISTORY — PX: LUMBAR LAMINECTOMY/DECOMPRESSION MICRODISCECTOMY: SHX5026

## 2014-10-07 SURGERY — LUMBAR LAMINECTOMY/DECOMPRESSION MICRODISCECTOMY 1 LEVEL
Anesthesia: General | Site: Back | Laterality: Right

## 2014-10-07 MED ORDER — MENTHOL 3 MG MT LOZG: 1.0000 | LOZENGE | OROMUCOSAL | Status: DC | PRN

## 2014-10-07 MED ORDER — ONDANSETRON HCL 4 MG/2ML IJ SOLN: 4.0000 mg | INTRAMUSCULAR | Status: DC | PRN

## 2014-10-07 MED ORDER — PROMETHAZINE HCL 25 MG/ML IJ SOLN: 6.2500 mg | INTRAMUSCULAR | Status: DC | PRN

## 2014-10-07 MED ORDER — LIDOCAINE-EPINEPHRINE 1 %-1:100000 IJ SOLN
INTRAMUSCULAR | Status: DC | PRN
  Administered 2014-10-07: 5 mL

## 2014-10-07 MED ORDER — PHENYLEPHRINE HCL 10 MG/ML IJ SOLN
INTRAMUSCULAR | Status: DC | PRN
  Administered 2014-10-07 (×4): 80 ug via INTRAVENOUS

## 2014-10-07 MED ORDER — ROCURONIUM BROMIDE 100 MG/10ML IV SOLN
INTRAVENOUS | Status: DC | PRN
  Administered 2014-10-07: 10 mg via INTRAVENOUS
  Administered 2014-10-07: 40 mg via INTRAVENOUS

## 2014-10-07 MED ORDER — GLYCOPYRROLATE 0.2 MG/ML IJ SOLN
INTRAMUSCULAR | Status: DC | PRN
  Administered 2014-10-07: 0.4 mg via INTRAVENOUS

## 2014-10-07 MED ORDER — SODIUM CHLORIDE 0.9 % IJ SOLN: 3.0000 mL | INTRAMUSCULAR | Status: DC | PRN

## 2014-10-07 MED ORDER — EPHEDRINE SULFATE 50 MG/ML IJ SOLN
INTRAMUSCULAR | Status: AC
  Filled 2014-10-07: qty 1

## 2014-10-07 MED ORDER — DIAZEPAM 5 MG PO TABS
5.0000 mg | ORAL_TABLET | Freq: Every day | ORAL | Status: DC
  Administered 2014-10-07: 5 mg via ORAL
  Filled 2014-10-07: qty 1

## 2014-10-07 MED ORDER — PROPOFOL 10 MG/ML IV BOLUS
INTRAVENOUS | Status: AC
  Filled 2014-10-07: qty 20

## 2014-10-07 MED ORDER — CEFAZOLIN SODIUM 1-5 GM-% IV SOLN: 1.0000 g | Freq: Three times a day (TID) | INTRAVENOUS | Status: DC

## 2014-10-07 MED ORDER — GLYCOPYRROLATE 0.2 MG/ML IJ SOLN
INTRAMUSCULAR | Status: AC
  Filled 2014-10-07: qty 2

## 2014-10-07 MED ORDER — MIDAZOLAM HCL 5 MG/5ML IJ SOLN
INTRAMUSCULAR | Status: DC | PRN
  Administered 2014-10-07: 2 mg via INTRAVENOUS

## 2014-10-07 MED ORDER — SODIUM CHLORIDE 0.9 % IJ SOLN
3.0000 mL | Freq: Two times a day (BID) | INTRAMUSCULAR | Status: DC
  Administered 2014-10-07 (×2): 3 mL via INTRAVENOUS

## 2014-10-07 MED ORDER — OXYCODONE HCL 5 MG PO TABS: 15.0000 mg | ORAL_TABLET | Freq: Four times a day (QID) | ORAL | Status: DC | PRN

## 2014-10-07 MED ORDER — LACTATED RINGERS IV SOLN
INTRAVENOUS | Status: DC
  Administered 2014-10-07: 50 mL/h via INTRAVENOUS

## 2014-10-07 MED ORDER — ROCURONIUM BROMIDE 50 MG/5ML IV SOLN
INTRAVENOUS | Status: AC
  Filled 2014-10-07: qty 1

## 2014-10-07 MED ORDER — ACETAMINOPHEN 325 MG PO TABS: 650.0000 mg | ORAL_TABLET | ORAL | Status: DC | PRN

## 2014-10-07 MED ORDER — PROPOFOL 10 MG/ML IV BOLUS
INTRAVENOUS | Status: DC | PRN
  Administered 2014-10-07: 150 mg via INTRAVENOUS

## 2014-10-07 MED ORDER — LIDOCAINE HCL (CARDIAC) 20 MG/ML IV SOLN
INTRAVENOUS | Status: DC | PRN
  Administered 2014-10-07: 60 mg via INTRAVENOUS

## 2014-10-07 MED ORDER — HYDROCODONE-ACETAMINOPHEN 10-325 MG PO TABS
1.0000 | ORAL_TABLET | Freq: Four times a day (QID) | ORAL | Status: DC | PRN
  Administered 2014-10-07: 1 via ORAL
  Filled 2014-10-07 (×2): qty 1

## 2014-10-07 MED ORDER — NEOSTIGMINE METHYLSULFATE 10 MG/10ML IV SOLN
INTRAVENOUS | Status: AC
  Filled 2014-10-07: qty 1

## 2014-10-07 MED ORDER — FENTANYL CITRATE 0.05 MG/ML IJ SOLN: 25.0000 ug | INTRAMUSCULAR | Status: DC | PRN

## 2014-10-07 MED ORDER — ONDANSETRON HCL 4 MG/2ML IJ SOLN
INTRAMUSCULAR | Status: DC | PRN
  Administered 2014-10-07: 4 mg via INTRAVENOUS

## 2014-10-07 MED ORDER — SODIUM CHLORIDE 0.9 % IV SOLN: 250.0000 mL | INTRAVENOUS | Status: DC

## 2014-10-07 MED ORDER — CYCLOBENZAPRINE HCL 10 MG PO TABS
10.0000 mg | ORAL_TABLET | Freq: Three times a day (TID) | ORAL | Status: DC | PRN
  Administered 2014-10-07 – 2014-10-08 (×2): 10 mg via ORAL
  Filled 2014-10-07 (×2): qty 1

## 2014-10-07 MED ORDER — THROMBIN 5000 UNITS EX SOLR
CUTANEOUS | Status: DC | PRN
  Administered 2014-10-07 (×2): 5000 [IU] via TOPICAL

## 2014-10-07 MED ORDER — HYDROMORPHONE HCL 1 MG/ML IJ SOLN: 0.5000 mg | INTRAMUSCULAR | Status: DC | PRN

## 2014-10-07 MED ORDER — ALUM & MAG HYDROXIDE-SIMETH 200-200-20 MG/5ML PO SUSP
30.0000 mL | Freq: Four times a day (QID) | ORAL | Status: DC | PRN
  Filled 2014-10-07: qty 30

## 2014-10-07 MED ORDER — PHENYLEPHRINE 40 MCG/ML (10ML) SYRINGE FOR IV PUSH (FOR BLOOD PRESSURE SUPPORT)
PREFILLED_SYRINGE | INTRAVENOUS | Status: AC
  Filled 2014-10-07: qty 10

## 2014-10-07 MED ORDER — LIDOCAINE HCL (CARDIAC) 20 MG/ML IV SOLN
INTRAVENOUS | Status: AC
  Filled 2014-10-07: qty 5

## 2014-10-07 MED ORDER — HEMOSTATIC AGENTS (NO CHARGE) OPTIME
TOPICAL | Status: DC | PRN
  Administered 2014-10-07: 1 via TOPICAL

## 2014-10-07 MED ORDER — ONDANSETRON HCL 4 MG/2ML IJ SOLN
INTRAMUSCULAR | Status: AC
  Filled 2014-10-07: qty 2

## 2014-10-07 MED ORDER — PHENOL 1.4 % MT LIQD: 1.0000 | OROMUCOSAL | Status: DC | PRN

## 2014-10-07 MED ORDER — ACETAMINOPHEN 650 MG RE SUPP: 650.0000 mg | RECTAL | Status: DC | PRN

## 2014-10-07 MED ORDER — LACTATED RINGERS IV SOLN
INTRAVENOUS | Status: DC | PRN
  Administered 2014-10-07 (×2): via INTRAVENOUS

## 2014-10-07 MED ORDER — MIDAZOLAM HCL 2 MG/2ML IJ SOLN
INTRAMUSCULAR | Status: AC
  Filled 2014-10-07: qty 2

## 2014-10-07 MED ORDER — FENTANYL CITRATE 0.05 MG/ML IJ SOLN
INTRAMUSCULAR | Status: AC
  Filled 2014-10-07: qty 5

## 2014-10-07 MED ORDER — VANCOMYCIN HCL IN DEXTROSE 1-5 GM/200ML-% IV SOLN
1000.0000 mg | Freq: Two times a day (BID) | INTRAVENOUS | Status: DC
  Administered 2014-10-07: 1000 mg via INTRAVENOUS
  Filled 2014-10-07 (×2): qty 200

## 2014-10-07 MED ORDER — SERTRALINE HCL 50 MG PO TABS
75.0000 mg | ORAL_TABLET | Freq: Every day | ORAL | Status: DC
  Administered 2014-10-07: 75 mg via ORAL
  Filled 2014-10-07 (×2): qty 1

## 2014-10-07 MED ORDER — SUCCINYLCHOLINE CHLORIDE 20 MG/ML IJ SOLN
INTRAMUSCULAR | Status: AC
  Filled 2014-10-07: qty 1

## 2014-10-07 MED ORDER — NEOSTIGMINE METHYLSULFATE 10 MG/10ML IV SOLN
INTRAVENOUS | Status: DC | PRN
  Administered 2014-10-07: 3 mg via INTRAVENOUS

## 2014-10-07 MED ORDER — SODIUM CHLORIDE 0.9 % IR SOLN
Status: DC | PRN
  Administered 2014-10-07: 500 mL

## 2014-10-07 MED ORDER — NITROGLYCERIN 0.4 MG SL SUBL: 0.4000 mg | SUBLINGUAL_TABLET | SUBLINGUAL | Status: DC | PRN

## 2014-10-07 MED ORDER — HYDROCODONE-ACETAMINOPHEN 10-325 MG PO TABS
1.0000 | ORAL_TABLET | ORAL | Status: DC | PRN
  Administered 2014-10-07 – 2014-10-08 (×2): 1 via ORAL
  Filled 2014-10-07: qty 1

## 2014-10-07 MED ORDER — BUPIVACAINE HCL (PF) 0.25 % IJ SOLN
INTRAMUSCULAR | Status: DC | PRN
  Administered 2014-10-07: 5 mL
  Administered 2014-10-07: 10 mL

## 2014-10-07 MED ORDER — PANTOPRAZOLE SODIUM 40 MG PO TBEC: 40.0000 mg | DELAYED_RELEASE_TABLET | Freq: Every day | ORAL | Status: DC

## 2014-10-07 MED ORDER — PRAVASTATIN SODIUM 20 MG PO TABS
20.0000 mg | ORAL_TABLET | Freq: Every day | ORAL | Status: DC
  Administered 2014-10-07: 20 mg via ORAL
  Filled 2014-10-07 (×2): qty 1

## 2014-10-07 MED ORDER — 0.9 % SODIUM CHLORIDE (POUR BTL) OPTIME
TOPICAL | Status: DC | PRN
  Administered 2014-10-07: 1000 mL

## 2014-10-07 MED ORDER — FENTANYL CITRATE 0.05 MG/ML IJ SOLN
INTRAMUSCULAR | Status: DC | PRN
  Administered 2014-10-07: 100 ug via INTRAVENOUS
  Administered 2014-10-07 (×2): 50 ug via INTRAVENOUS
  Administered 2014-10-07: 150 ug via INTRAVENOUS
  Administered 2014-10-07 (×2): 50 ug via INTRAVENOUS

## 2014-10-07 MED ORDER — SODIUM CHLORIDE 0.9 % IJ SOLN
INTRAMUSCULAR | Status: AC
  Filled 2014-10-07: qty 10

## 2014-10-07 MED ORDER — ASPIRIN EC 81 MG PO TBEC
81.0000 mg | DELAYED_RELEASE_TABLET | Freq: Every day | ORAL | Status: DC
  Filled 2014-10-07: qty 1

## 2014-10-07 MED ORDER — LOSARTAN POTASSIUM 50 MG PO TABS
50.0000 mg | ORAL_TABLET | Freq: Every day | ORAL | Status: DC
  Administered 2014-10-07: 50 mg via ORAL
  Filled 2014-10-07 (×2): qty 1

## 2014-10-07 SURGICAL SUPPLY — 55 items
BAG DECANTER FOR FLEXI CONT (MISCELLANEOUS) ×3 IMPLANT
BENZOIN TINCTURE PRP APPL 2/3 (GAUZE/BANDAGES/DRESSINGS) ×3 IMPLANT
BLADE CLIPPER SURG (BLADE) IMPLANT
BLADE SURG 11 STRL SS (BLADE) ×3 IMPLANT
BRUSH SCRUB EZ PLAIN DRY (MISCELLANEOUS) ×3 IMPLANT
BUR MATCHSTICK NEURO 3.0 LAGG (BURR) ×3 IMPLANT
BUR PRECISION FLUTE 6.0 (BURR) ×3 IMPLANT
CANISTER SUCT 3000ML PPV (MISCELLANEOUS) ×3 IMPLANT
CLOSURE WOUND 1/2 X4 (GAUZE/BANDAGES/DRESSINGS) ×1
CONT SPEC 4OZ CLIKSEAL STRL BL (MISCELLANEOUS) ×3 IMPLANT
DECANTER SPIKE VIAL GLASS SM (MISCELLANEOUS) ×3 IMPLANT
DERMABOND ADHESIVE PROPEN (GAUZE/BANDAGES/DRESSINGS) ×2
DERMABOND ADVANCED .7 DNX6 (GAUZE/BANDAGES/DRESSINGS) ×1 IMPLANT
DRAPE LAPAROTOMY 100X72X124 (DRAPES) ×3 IMPLANT
DRAPE MICROSCOPE LEICA (MISCELLANEOUS) ×3 IMPLANT
DRAPE POUCH INSTRU U-SHP 10X18 (DRAPES) ×3 IMPLANT
DRAPE PROXIMA HALF (DRAPES) IMPLANT
DRAPE SURG 17X23 STRL (DRAPES) ×3 IMPLANT
DRSG OPSITE 4X5.5 SM (GAUZE/BANDAGES/DRESSINGS) ×3 IMPLANT
DRSG OPSITE POSTOP 4X6 (GAUZE/BANDAGES/DRESSINGS) ×3 IMPLANT
DURAPREP 26ML APPLICATOR (WOUND CARE) ×3 IMPLANT
ELECT REM PT RETURN 9FT ADLT (ELECTROSURGICAL) ×3
ELECTRODE REM PT RTRN 9FT ADLT (ELECTROSURGICAL) ×1 IMPLANT
GAUZE SPONGE 4X4 12PLY STRL (GAUZE/BANDAGES/DRESSINGS) ×3 IMPLANT
GAUZE SPONGE 4X4 16PLY XRAY LF (GAUZE/BANDAGES/DRESSINGS) IMPLANT
GLOVE BIO SURGEON STRL SZ8 (GLOVE) ×3 IMPLANT
GLOVE EXAM NITRILE LRG STRL (GLOVE) IMPLANT
GLOVE EXAM NITRILE MD LF STRL (GLOVE) IMPLANT
GLOVE EXAM NITRILE XL STR (GLOVE) IMPLANT
GLOVE EXAM NITRILE XS STR PU (GLOVE) IMPLANT
GLOVE INDICATOR 8.5 STRL (GLOVE) ×3 IMPLANT
GOWN STRL REUS W/ TWL LRG LVL3 (GOWN DISPOSABLE) ×1 IMPLANT
GOWN STRL REUS W/ TWL XL LVL3 (GOWN DISPOSABLE) ×2 IMPLANT
GOWN STRL REUS W/TWL 2XL LVL3 (GOWN DISPOSABLE) IMPLANT
GOWN STRL REUS W/TWL LRG LVL3 (GOWN DISPOSABLE) ×2
GOWN STRL REUS W/TWL XL LVL3 (GOWN DISPOSABLE) ×4
KIT BASIN OR (CUSTOM PROCEDURE TRAY) ×3 IMPLANT
KIT ROOM TURNOVER OR (KITS) ×3 IMPLANT
LIQUID BAND (GAUZE/BANDAGES/DRESSINGS) ×3 IMPLANT
NEEDLE HYPO 22GX1.5 SAFETY (NEEDLE) ×3 IMPLANT
NEEDLE SPNL 22GX3.5 QUINCKE BK (NEEDLE) ×3 IMPLANT
NS IRRIG 1000ML POUR BTL (IV SOLUTION) ×3 IMPLANT
PACK LAMINECTOMY NEURO (CUSTOM PROCEDURE TRAY) ×3 IMPLANT
RUBBERBAND STERILE (MISCELLANEOUS) ×6 IMPLANT
SPONGE SURGIFOAM ABS GEL SZ50 (HEMOSTASIS) ×3 IMPLANT
STRIP CLOSURE SKIN 1/2X4 (GAUZE/BANDAGES/DRESSINGS) ×2 IMPLANT
SUT VIC AB 0 CT1 18XCR BRD8 (SUTURE) ×1 IMPLANT
SUT VIC AB 0 CT1 8-18 (SUTURE) ×2
SUT VIC AB 2-0 CT1 18 (SUTURE) ×3 IMPLANT
SUT VICRYL 4-0 PS2 18IN ABS (SUTURE) ×3 IMPLANT
SYR 20ML ECCENTRIC (SYRINGE) ×3 IMPLANT
TAPE STRIPS DRAPE STRL (GAUZE/BANDAGES/DRESSINGS) ×3 IMPLANT
TOWEL OR 17X24 6PK STRL BLUE (TOWEL DISPOSABLE) ×3 IMPLANT
TOWEL OR 17X26 10 PK STRL BLUE (TOWEL DISPOSABLE) ×3 IMPLANT
WATER STERILE IRR 1000ML POUR (IV SOLUTION) ×3 IMPLANT

## 2014-10-07 NOTE — Plan of Care (Signed)
Problem: Consults Goal: Diagnosis - Spinal Surgery Outcome: Completed/Met Date Met:  10/07/14 Microdiscectomy

## 2014-10-07 NOTE — Op Note (Signed)
Preoperative diagnosis: Right L3 radiculopathy from lumbar spondylosis with stenosis in the extra foraminal space at L3-4 with herniated nuclear stenosis L3-4. Postoperative diagnosis: Same  Procedure: Extraforaminal decompression discectomy L3-4 on the right with microdissection of the right L3 nerve root microdiscectomy discectomy  Surgeon: Dominica Severin Huzaifa Viney  Asst.: Newman Pies  Anesthesia: Gen.  EBL: Minimal  History of present illness: Patient is a very pleasant 59 year old some is a progress worsening back and probably right leg pain rating down his anterior thigh to his knee patient had failed all forms of conservative treatment imaging revealed severe compression of the L3 nerve in the extra foraminal space with combination of lumbar spondylosis and disc herniation to the patient takes her treatment progression of clinical syndrome and imaging findings a recommended extra extra foraminal discectomy and decompression of the right L3 nerve root. I extensively reviewed the risks and benefits of the operation with the patient as well as perioperative course expectations of outcome alternatives surgery and he understands and agrees to proceed forward.  Operative procedure: Patient brought into the or was induced under general anesthesia positioned prone the Wilson frame his back was prepped and draped in routine sterile fashion preoperative x-ray localized the L4-5 disc space and incision was made just superior to this after infiltration of 10 mL lidocaine with epi a midline dissection was carried out subperiosteal dissection was carried out exposing the extra foraminal space and transverse process of L3. This was confirmed with interoperative x-ray. The lateral pars superior aspect of the L3-4 facet and inferior aspect to 3 facet was drilled down a high-speed drill then under biting the lateral pars and superior aspect through 4 facet allowed an indication of the inner transverse ligament which markedly  hypertrophied. There was a large spur coming off the superior aspect 34 facet as well as the inferior aspect of the 23 facet this is all bitten away with a 2 mm Kerrison punch. The L3 nerve was identified and marks Illumination the L3 pedicles palpated doing up underneath the L3 nerve there was a small discoloration still partially contained ligament this was incised and cleanout pituitary rongeurs. Extensive amount of ossified was removed from overlying the dorsal aspect of the L3 nerve. At the end and of the decompression discectomy there is no further stenosis on the L3 nerve the foramen was widely patent. The wounds and copious irrigated meticulous in space was maintained Gelfoam was opened up the dura and the muscle fascia proximal in layers with after Vicryl and the skin was closed running 4 subcuticular benzoin and Steri-Strips were applied patient recovered in stable condition. At the end of the case all needle counts and sponge counts were correct.

## 2014-10-07 NOTE — Anesthesia Preprocedure Evaluation (Signed)
Anesthesia Evaluation  Patient identified by MRN, date of birth, ID band Patient awake    Airway Mallampati: II  TM Distance: >3 FB Neck ROM: Full    Dental   Pulmonary shortness of breath, former smoker,  breath sounds clear to auscultation        Cardiovascular hypertension, + CAD and + Peripheral Vascular Disease Rhythm:Regular Rate:Normal     Neuro/Psych    GI/Hepatic Neg liver ROS, GERD-  ,  Endo/Other    Renal/GU negative Renal ROS     Musculoskeletal   Abdominal   Peds  Hematology   Anesthesia Other Findings   Reproductive/Obstetrics                             Anesthesia Physical Anesthesia Plan  ASA: III  Anesthesia Plan: General   Post-op Pain Management:    Induction: Intravenous  Airway Management Planned: Oral ETT  Additional Equipment:   Intra-op Plan:   Post-operative Plan: Extubation in OR  Informed Consent: I have reviewed the patients History and Physical, chart, labs and discussed the procedure including the risks, benefits and alternatives for the proposed anesthesia with the patient or authorized representative who has indicated his/her understanding and acceptance.   Dental advisory given  Plan Discussed with: CRNA, Anesthesiologist and Surgeon  Anesthesia Plan Comments:         Anesthesia Quick Evaluation

## 2014-10-07 NOTE — Anesthesia Procedure Notes (Signed)
Procedure Name: Intubation Date/Time: 10/07/2014 10:23 AM Performed by: Rebekah Chesterfield L Pre-anesthesia Checklist: Patient identified, Emergency Drugs available, Suction available, Patient being monitored and Timeout performed Patient Re-evaluated:Patient Re-evaluated prior to inductionOxygen Delivery Method: Circle system utilized Preoxygenation: Pre-oxygenation with 100% oxygen Intubation Type: IV induction Ventilation: Mask ventilation without difficulty Laryngoscope Size: Mac and 4 Grade View: Grade I Tube type: Oral Tube size: 8.0 mm Number of attempts: 1 Airway Equipment and Method: Stylet Placement Confirmation: ETT inserted through vocal cords under direct vision,  positive ETCO2 and breath sounds checked- equal and bilateral Secured at: 22 cm Tube secured with: Tape Dental Injury: Teeth and Oropharynx as per pre-operative assessment

## 2014-10-07 NOTE — H&P (Signed)
Charles Grimes is an 59 y.o. male.   Chief Complaint: Back and right leg pain HPI: Patient is a very pleasant 59 year old gentleman is a progress worsening back and right leg pain rating down anterior quad to his knee consistent with an L3 nerve root pattern. Workup revealed severe extra foraminal stenosis on the right at L3-4 patient failed all forms of conservative treatment and due to his progressive clinical syndrome imaging findings and failure conservative treatment I recommended extra foraminal discectomy at L3-4 on the right I extensively went over the risks and benefits of the operation with the patient as well as perioperative course expectations of outcome and alternatives of surgery and he understood and agreed to proceed forward.  Past Medical History  Diagnosis Date  . Dyslipidemia, goal LDL below 100   . Essential hypertension   . Obesity (BMI 30.0-34.9)   . Family history of premature CAD   . Nonischemic cardiomyopathy 05/2013    Non-ischemic: EF ~45% by Echo & Myoview --> Non-obstructive CAD  . Coronary artery disease, non-occlusive 08/2013    Mild to moderate (40% OM1) single vessel CAD. Otherwise nonobstructed.  . Anxiety   . Depression   . GERD (gastroesophageal reflux disease)   . Anxiety   . H/O skin disorder     Involving hands. Subsequently resolved.     Past Surgical History  Procedure Laterality Date  . Nm myoview ltd  06/03/2013    Low risk study with no ischemia. EF roughly 44% with no regional wall motion abnormalities noted  . Transthoracic echocardiogram  06/11/2013    Mildly reduced EF: 45-50%. Mild anterior hypokinesis with incoordinate septal motion. No evidence of pulmonary hypertension  . Lower extremity arterial dopplers  06/11/2013    No occlusive disease  . Pfts  06/2013    Normal Volumes &  Spirometry; Moderately reduced DLCO  . Left heart catheterization with coronary angiogram  08/26/2013    Mild to moderate disease with 40-50% stenosis and  OM1. Otherwise no significant CAD --  Surgeon: Leonie Man, MD;  Location: [facility] CATH LAB;  Service: Cardiovascular;;    Family History  Problem Relation Age of Onset  . Atrial fibrillation Mother     Alive at 49  . COPD Mother   . Hypertension Mother   . Hypertension Father     Alive at 98  . Lung cancer Father     Chronic smoker  . Factor V Leiden deficiency Father     Also Lupus anticoagulant  . Heart attack Maternal Grandmother 48  . Heart failure Paternal Grandmother   . Diabetes Paternal Grandmother   . Heart attack Brother 33    X2  . Heart attack Sister 75    Deceased  . Healthy Sister     x2  . Healthy Son     x2  . Colon cancer Neg Hx   . Esophageal cancer Neg Hx   . Rectal cancer Neg Hx   . Stomach cancer Neg Hx    Social History:  reports that he quit smoking about 13 months ago. His smoking use included Cigarettes. He smoked 1.00 pack per day. He has never used smokeless tobacco. He reports that he drinks alcohol. He reports that he does not use illicit drugs.  Allergies:  Allergies  Allergen Reactions  . Isosorbide Other (See Comments)    CONTINUOUS HEADACHE   . Oxycodone Itching  . Penicillins Hives and Rash    Medications Prior to Admission  Medication  Sig Dispense Refill  . acetaminophen (TYLENOL) 325 MG tablet Take 650 mg by mouth as needed for mild pain.     . diazepam (VALIUM) 5 MG tablet TAKE 1 TABLET BY MOUTH AT BEDTIME 30 tablet 1  . HYDROcodone-acetaminophen (NORCO) 10-325 MG per tablet Take 1 tablet by mouth every 6 (six) hours as needed. pain  0  . losartan (COZAAR) 50 MG tablet Take 1 tablet (50 mg total) by mouth daily. 30 tablet 11  . nitroGLYCERIN (NITROSTAT) 0.4 MG SL tablet Place 1 tablet (0.4 mg total) under the tongue every 5 (five) minutes as needed for chest pain. 30 tablet 1  . omeprazole (PRILOSEC OTC) 20 MG tablet Take 20 mg by mouth daily.    . pravastatin (PRAVACHOL) 20 MG tablet Take 1 tablet (20 mg total) by mouth daily. 90  tablet 1  . sertraline (ZOLOFT) 50 MG tablet Take 1.5 tablets (75 mg total) by mouth daily. 90 tablet 1  . aspirin EC 81 MG tablet Take 81 mg by mouth daily.    . mupirocin ointment (BACTROBAN) 2 %   0  . oxyCODONE (ROXICODONE) 15 MG immediate release tablet Take 15 mg by mouth every 6 (six) hours as needed for pain.    Marland Kitchen sertraline (ZOLOFT) 50 MG tablet TAKE 1.5 TABLETS (75 MG TOTAL) BY MOUTH DAILY. 135 tablet 1    No results found for this or any previous visit (from the past 48 hour(s)). No results found.  Review of Systems  Constitutional: Negative.   HENT: Negative.   Eyes: Negative.   Respiratory: Negative.   Cardiovascular: Negative.   Gastrointestinal: Negative.   Genitourinary: Negative.   Musculoskeletal: Positive for myalgias and back pain.  Skin: Negative.   Neurological: Negative.   Psychiatric/Behavioral: Negative.     Blood pressure 142/77, pulse 66, temperature 98.1 F (36.7 C), temperature source Oral, resp. rate 20, weight 97.977 kg (216 lb), SpO2 96 %. Physical Exam  Constitutional: He is oriented to person, place, and time. He appears well-developed.  Eyes: Pupils are equal, round, and reactive to light.  Neck: Normal range of motion.  Respiratory: Effort normal.  GI: Soft.  Neurological: He is alert and oriented to person, place, and time. He has normal strength. GCS eye subscore is 4. GCS verbal subscore is 5. GCS motor subscore is 6.  Strength is 5 out of 5 in his iliopsoas, quads, hip she's, gastrocs, and tibialis, and EHL.     Assessment/Plan 59 year old gentleman presents for right-sided L3-4 extra foraminal discectomy  Charles Grimes P 10/07/2014, 10:11 AM

## 2014-10-07 NOTE — Transfer of Care (Signed)
Immediate Anesthesia Transfer of Care Note  Patient: Charles Grimes  Procedure(s) Performed: Procedure(s) with comments: LUMBAR LAMINECTOMY/DECOMPRESSION MICRODISCECTOMY 1 LEVEL (Right) - Right L3-L4 Microdiscectomy  Patient Location: PACU  Anesthesia Type:General  Level of Consciousness: awake, alert , oriented and patient cooperative  Airway & Oxygen Therapy: Patient Spontanous Breathing and Patient connected to nasal cannula oxygen  Post-op Assessment: Report given to RN, Post -op Vital signs reviewed and stable and Patient moving all extremities  Post vital signs: Reviewed and stable  Last Vitals:  Filed Vitals:   10/07/14 0901  BP: 142/77  Pulse: 66  Temp: 36.7 C  Resp: 20    Complications: No apparent anesthesia complications

## 2014-10-07 NOTE — Anesthesia Postprocedure Evaluation (Signed)
  Anesthesia Post-op Note  Patient: Charles Grimes  Procedure(s) Performed: Procedure(s) with comments: LUMBAR LAMINECTOMY/DECOMPRESSION MICRODISCECTOMY 1 LEVEL (Right) - Right L3-L4 Microdiscectomy  Patient Location: PACU  Anesthesia Type:General  Level of Consciousness: awake  Airway and Oxygen Therapy: Patient Spontanous Breathing  Post-op Pain: mild  Post-op Assessment: Post-op Vital signs reviewed  Post-op Vital Signs: Reviewed  Last Vitals:  Filed Vitals:   10/07/14 1320  BP: 116/77  Pulse: 79  Temp: 36.4 C  Resp: 20    Complications: No apparent anesthesia complications

## 2014-10-07 NOTE — Progress Notes (Signed)
Unable to take to room, per floor NT

## 2014-10-08 DIAGNOSIS — M5116 Intervertebral disc disorders with radiculopathy, lumbar region: Secondary | ICD-10-CM | POA: Diagnosis not present

## 2014-10-08 MED ORDER — CYCLOBENZAPRINE HCL 10 MG PO TABS: 10.0000 mg | ORAL_TABLET | Freq: Three times a day (TID) | ORAL | Status: DC | PRN

## 2014-10-08 NOTE — Discharge Summary (Signed)
  Physician Discharge Summary  Patient ID: Charles Grimes MRN: [id number] DOB/AGE: 01/29/1956 59 y.o.  Admit date: 10/07/2014 Discharge date: 10/08/2014  Admission Diagnoses: Right L3 radiculopathy from lumbar spondylosis stenosis and a disc herniation extraforaminal he at L3-4 on the right  Discharge Diagnoses: Same Active Problems:   HNP (herniated nucleus pulposus), lumbar   Discharged Condition: good  Hospital Course: Patient admitted hospital underwent extra foraminal decompression discectomy L3-4 on the right postop patient did very well recovered in the floor on the floor he was angling and voiding spontaneously tolerating regular diet was stable for discharge home.  Consults: Significant Diagnostic Studies: Treatments: L3-4 extraforaminal decompression discectomy on the right Discharge Exam: Blood pressure 101/57, pulse 76, temperature 98.8 F (37.1 C), temperature source Oral, resp. rate 18, weight 97.977 kg (216 lb), SpO2 94 %. Strength out of 5 wound clean dry and intact  Disposition: Home     Medication List    TAKE these medications        acetaminophen 325 MG tablet  Commonly known as:  TYLENOL  Take 650 mg by mouth as needed for mild pain.     aspirin EC 81 MG tablet  Take 81 mg by mouth daily.     cyclobenzaprine 10 MG tablet  Commonly known as:  FLEXERIL  Take 1 tablet (10 mg total) by mouth 3 (three) times daily as needed for muscle spasms.     diazepam 5 MG tablet  Commonly known as:  VALIUM  TAKE 1 TABLET BY MOUTH AT BEDTIME     HYDROcodone-acetaminophen 10-325 MG per tablet  Commonly known as:  NORCO  Take 1 tablet by mouth every 6 (six) hours as needed. pain     losartan 50 MG tablet  Commonly known as:  COZAAR  Take 1 tablet (50 mg total) by mouth daily.     mupirocin ointment 2 %  Commonly known as:  BACTROBAN     nitroGLYCERIN 0.4 MG SL tablet  Commonly known as:  NITROSTAT  Place 1 tablet (0.4 mg total) under the tongue every 5  (five) minutes as needed for chest pain.     omeprazole 20 MG tablet  Commonly known as:  PRILOSEC OTC  Take 20 mg by mouth daily.     oxyCODONE 15 MG immediate release tablet  Commonly known as:  ROXICODONE  Take 15 mg by mouth every 6 (six) hours as needed for pain.     pravastatin 20 MG tablet  Commonly known as:  PRAVACHOL  Take 1 tablet (20 mg total) by mouth daily.     sertraline 50 MG tablet  Commonly known as:  ZOLOFT  Take 1.5 tablets (75 mg total) by mouth daily.     sertraline 50 MG tablet  Commonly known as:  ZOLOFT  TAKE 1.5 TABLETS (75 MG TOTAL) BY MOUTH DAILY.           Follow-up Information    Follow up with [facility] P, MD.   Specialty:  Neurosurgery   Contact information:   [address] [place] [address] [phone]       Signed: Elaina Hoops 10/08/2014, 6:29 AM

## 2014-10-08 NOTE — Discharge Instructions (Signed)
No lifting no bending no twisting no driving a riding a car unless he is calm back and forth to see me. Keep the incision clean dry and intact.

## 2014-10-08 NOTE — Progress Notes (Signed)
Discharge instructions, education and Rx given to patient with wife at bedside and they both verbalized understanding. No signs of infection noted on incision site.

## 2014-10-08 NOTE — Progress Notes (Signed)
Patient ID: Charles Grimes, male   DOB: 30-Apr-1956, 59 y.o.   MRN: [id number] Doing well no leg pain discharged home  Strength out of 5 wound clean dry and intact

## 2014-10-12 ENCOUNTER — Encounter (HOSPITAL_COMMUNITY): Payer: Self-pay | Admitting: Neurosurgery

## 2014-11-15 ENCOUNTER — Encounter: Payer: Self-pay | Admitting: Physician Assistant

## 2014-11-15 DIAGNOSIS — IMO0002 Reserved for concepts with insufficient information to code with codable children: Secondary | ICD-10-CM

## 2014-12-15 ENCOUNTER — Encounter: Payer: Self-pay | Admitting: Physician Assistant

## 2014-12-15 ENCOUNTER — Ambulatory Visit (INDEPENDENT_AMBULATORY_CARE_PROVIDER_SITE_OTHER): Payer: 59 | Admitting: Physician Assistant

## 2014-12-15 VITALS — BP 109/71 | HR 82 | Temp 97.8°F | Ht 71.0 in | Wt 206.4 lb

## 2014-12-15 DIAGNOSIS — F419 Anxiety disorder, unspecified: Secondary | ICD-10-CM

## 2014-12-15 DIAGNOSIS — S70369A Insect bite (nonvenomous), unspecified thigh, initial encounter: Secondary | ICD-10-CM | POA: Insufficient documentation

## 2014-12-15 DIAGNOSIS — W57XXXA Bitten or stung by nonvenomous insect and other nonvenomous arthropods, initial encounter: Secondary | ICD-10-CM | POA: Diagnosis not present

## 2014-12-15 MED ORDER — DOXYCYCLINE HYCLATE 50 MG PO CAPS: 50.0000 mg | ORAL_CAPSULE | Freq: Two times a day (BID) | ORAL | Status: DC

## 2014-12-15 MED ORDER — SERTRALINE HCL 100 MG PO TABS: 100.0000 mg | ORAL_TABLET | Freq: Every day | ORAL | Status: DC

## 2014-12-15 MED ORDER — DIAZEPAM 10 MG PO TABS: 10.0000 mg | ORAL_TABLET | Freq: Every day | ORAL | Status: DC

## 2014-12-15 NOTE — Patient Instructions (Signed)
Please take the antibiotic as directed. Keep the area clean and dry. I have increased your Zoloft to 100 mg daily and Valium to 10 mg at bedtime.  Please take as directed. Follow-up in 1 month.

## 2014-12-15 NOTE — Assessment & Plan Note (Signed)
Localized cellulitis.  No remnant of tick noted. Rx Doxycycline. Supportive measures reviewed.  Follow-up 1 week if not completely resolved.

## 2014-12-15 NOTE — Progress Notes (Signed)
Pre visit review using our clinic review tool, if applicable. No additional management support is needed unless otherwise documented below in the visit note. 

## 2014-12-15 NOTE — Progress Notes (Signed)
Patient presents to clinic today c/o tick bite to R thigh first noticed and removed on Friday.  Since that time has noticed redness, swelling and tenderness around the site.  Denies fever, chills, rash or arthralgias.  Patient also wishes to discuss increasing his Sertraline.  Is having hard time dealing with all of his recent health issues and passing of his father.  Feels down in the dumps and tearful most days.  Is not sleeping well.  Denies SI/HI.  Past Medical History  Diagnosis Date  . Dyslipidemia, goal LDL below 100   . Essential hypertension   . Obesity (BMI 30.0-34.9)   . Family history of premature CAD   . Nonischemic cardiomyopathy 05/2013    Non-ischemic: EF ~45% by Echo & Myoview --> Non-obstructive CAD  . Coronary artery disease, non-occlusive 08/2013    Mild to moderate (40% OM1) single vessel CAD. Otherwise nonobstructed.  . Anxiety   . Depression   . GERD (gastroesophageal reflux disease)   . Anxiety   . H/O skin disorder     Involving hands. Subsequently resolved.     Current Outpatient Prescriptions on File Prior to Visit  Medication Sig Dispense Refill  . acetaminophen (TYLENOL) 325 MG tablet Take 650 mg by mouth as needed for mild pain.     Marland Kitchen aspirin EC 81 MG tablet Take 81 mg by mouth daily.    Marland Kitchen HYDROcodone-acetaminophen (NORCO) 10-325 MG per tablet Take 1 tablet by mouth every 6 (six) hours as needed. pain  0  . losartan (COZAAR) 50 MG tablet Take 1 tablet (50 mg total) by mouth daily. 30 tablet 11  . nitroGLYCERIN (NITROSTAT) 0.4 MG SL tablet Place 1 tablet (0.4 mg total) under the tongue every 5 (five) minutes as needed for chest pain. 30 tablet 1  . omeprazole (PRILOSEC OTC) 20 MG tablet Take 20 mg by mouth daily.    . pravastatin (PRAVACHOL) 20 MG tablet Take 1 tablet (20 mg total) by mouth daily. 90 tablet 1   No current facility-administered medications on file prior to visit.    Allergies  Allergen Reactions  . Isosorbide Other (See Comments)      CONTINUOUS HEADACHE   . Oxycodone Itching  . Penicillins Hives and Rash    Family History  Problem Relation Age of Onset  . Atrial fibrillation Mother     Alive at 41  . COPD Mother   . Hypertension Mother   . Hypertension Father     Alive at 49  . Lung cancer Father     Chronic smoker  . Factor V Leiden deficiency Father     Also Lupus anticoagulant  . Heart attack Maternal Grandmother 48  . Heart failure Paternal Grandmother   . Diabetes Paternal Grandmother   . Heart attack Brother 78    X2  . Heart attack Sister 18    Deceased  . Healthy Sister     x2  . Healthy Son     x2  . Colon cancer Neg Hx   . Esophageal cancer Neg Hx   . Rectal cancer Neg Hx   . Stomach cancer Neg Hx     History   Social History  . Marital Status: Married    Spouse Name: N/A  . Number of Children: N/A  . Years of Education: N/A   Social History Main Topics  . Smoking status: Former Smoker -- 1.00 packs/day    Types: Cigarettes    Quit date: 08/11/2013  .  Smokeless tobacco: Never Used  . Alcohol Use: Yes     Comment: rarely  . Drug Use: No  . Sexual Activity: Not on file   Other Topics Concern  . None   Social History Narrative   Married father of 2 boys. Lives with wife, [name], and one son.   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   Only occasional beer and mixed drinks.   Does not exercise because he is too tired.   Review of Systems - See HPI.  All other ROS are negative.  BP 109/71 mmHg  Pulse 82  Temp(Src) 97.8 F (36.6 C) (Oral)  Ht _0  (1.803 m)  Wt 206 lb 6.4 oz (93.622 kg)  BMI 28.80 kg/m2  SpO2 97%  Physical Exam  Constitutional: He is oriented to person, place, and time and well-developed, well-nourished, and in no distress.  HENT:  Head: Normocephalic and atraumatic.  Cardiovascular: Normal rate, regular rhythm, normal  heart sounds and intact distal pulses.   Pulmonary/Chest: Effort normal and breath sounds normal. No respiratory distress. He has no wheezes. He has no rales. He exhibits no tenderness.  Neurological: He is alert and oriented to person, place, and time.  Skin: Skin is warm and dry. No rash noted.     Psychiatric: Affect normal.  Vitals reviewed.   Recent Results (from the past 2160 hour(s))  Surgical pcr screen     Status: Abnormal   Collection Time: 09/28/14  3:39 PM  Result Value Ref Range   MRSA, PCR NEGATIVE NEGATIVE   Staphylococcus aureus POSITIVE (A) NEGATIVE    Comment:        The Xpert SA Assay (FDA approved for NASAL specimens in patients over 80 years of age), is one component of a comprehensive surveillance program.  Test performance has been validated by [facility] for patients greater than or equal to 7 year old. It is not intended to diagnose infection nor to guide or monitor treatment.   Basic metabolic panel     Status: None   Collection Time: 09/28/14  3:39 PM  Result Value Ref Range   Sodium 138 135 - 145 mmol/L   Potassium 4.1 3.5 - 5.1 mmol/L   Chloride 109 96 - 112 mmol/L   CO2 19 19 - 32 mmol/L   Glucose, Bld 86 70 - 99 mg/dL   BUN 18 6 - 23 mg/dL   Creatinine, Ser 0.86 0.50 - 1.35 mg/dL   Calcium 8.7 8.4 - 10.5 mg/dL   GFR calc non Af Amer >90 >90 mL/min   GFR calc Af Amer >90 >90 mL/min    Comment: (NOTE) The eGFR has been calculated using the CKD EPI equation. This calculation has not been validated in all clinical situations. eGFR's persistently <90 mL/min signify possible Chronic Kidney Disease.    Anion gap 10 5 - 15  CBC     Status: None   Collection Time: 09/28/14  3:39 PM  Result Value Ref Range   WBC 9.5 4.0 - 10.5 K/uL   RBC 4.81 4.22 - 5.81 MIL/uL   Hemoglobin 14.2 13.0 - 17.0 g/dL   HCT 42.9 39.0 - 52.0 %   MCV 89.2 78.0 - 100.0 fL   MCH 29.5 26.0 - 34.0 pg   MCHC 33.1 30.0 - 36.0 g/dL   RDW 13.9 11.5 - 15.5 %  Platelets 193 150 - 400 K/uL    Assessment/Plan: Anxiety Anxiety and depression slightly worsened due to recent events.  Will increase Sertraline to 100 mg.  Increase Valium to 10 mg QHS.   Tick bite of thigh with local reaction Localized cellulitis.  No remnant of tick noted. Rx Doxycycline. Supportive measures reviewed.  Follow-up 1 week if not completely resolved.

## 2014-12-15 NOTE — Assessment & Plan Note (Signed)
Anxiety and depression slightly worsened due to recent events.  Will increase Sertraline to 100 mg.  Increase Valium to 10 mg QHS.

## 2014-12-29 ENCOUNTER — Other Ambulatory Visit: Payer: Self-pay | Admitting: Physician Assistant

## 2015-01-12 ENCOUNTER — Encounter: Payer: Self-pay | Admitting: Physician Assistant

## 2015-01-12 ENCOUNTER — Ambulatory Visit (INDEPENDENT_AMBULATORY_CARE_PROVIDER_SITE_OTHER): Payer: 59 | Admitting: Physician Assistant

## 2015-01-12 VITALS — BP 120/75 | HR 68 | Temp 97.9°F | Ht 71.0 in | Wt 202.0 lb

## 2015-01-12 DIAGNOSIS — F419 Anxiety disorder, unspecified: Secondary | ICD-10-CM

## 2015-01-12 DIAGNOSIS — M25511 Pain in right shoulder: Secondary | ICD-10-CM

## 2015-01-12 DIAGNOSIS — M25512 Pain in left shoulder: Secondary | ICD-10-CM | POA: Diagnosis not present

## 2015-01-12 DIAGNOSIS — G8929 Other chronic pain: Secondary | ICD-10-CM | POA: Diagnosis not present

## 2015-01-12 MED ORDER — SERTRALINE HCL 100 MG PO TABS: 100.0000 mg | ORAL_TABLET | Freq: Every day | ORAL | Status: DC

## 2015-01-12 MED ORDER — DIAZEPAM 10 MG PO TABS: 10.0000 mg | ORAL_TABLET | Freq: Every day | ORAL | Status: DC

## 2015-01-12 NOTE — Progress Notes (Signed)
Patient presents to clinic today for 1 month follow-up of depression after increasing Zoloft 100 mg daily. Endorses good improvement. Denies breakthrough symptoms, SI/HI.  Past Medical History  Diagnosis Date  . Dyslipidemia, goal LDL below 100   . Essential hypertension   . Obesity (BMI 30.0-34.9)   . Family history of premature CAD   . Nonischemic cardiomyopathy 05/2013    Non-ischemic: EF ~45% by Echo & Myoview --> Non-obstructive CAD  . Coronary artery disease, non-occlusive 08/2013    Mild to moderate (40% OM1) single vessel CAD. Otherwise nonobstructed.  . Anxiety   . Depression   . GERD (gastroesophageal reflux disease)   . Anxiety   . H/O skin disorder     Involving hands. Subsequently resolved.     Current Outpatient Prescriptions on File Prior to Visit  Medication Sig Dispense Refill  . acetaminophen (TYLENOL) 325 MG tablet Take 650 mg by mouth as needed for mild pain.     Marland Kitchen aspirin EC 81 MG tablet Take 81 mg by mouth daily.    Marland Kitchen HYDROcodone-acetaminophen (NORCO) 10-325 MG per tablet Take 1 tablet by mouth every 6 (six) hours as needed. pain  0  . losartan (COZAAR) 50 MG tablet Take 1 tablet (50 mg total) by mouth daily. 30 tablet 11  . nitroGLYCERIN (NITROSTAT) 0.4 MG SL tablet Place 1 tablet (0.4 mg total) under the tongue every 5 (five) minutes as needed for chest pain. 30 tablet 1  . omeprazole (PRILOSEC OTC) 20 MG tablet Take 20 mg by mouth daily.    . pravastatin (PRAVACHOL) 20 MG tablet TAKE 1 TABLET (20 MG TOTAL) BY MOUTH DAILY. 90 tablet 1   No current facility-administered medications on file prior to visit.    Allergies  Allergen Reactions  . Isosorbide Other (See Comments)    CONTINUOUS HEADACHE   . Oxycodone Itching  . Penicillins Hives and Rash    Family History  Problem Relation Age of Onset  . Atrial fibrillation Mother     Alive at 73  . COPD Mother   . Hypertension Mother   . Hypertension Father     Alive at 10  . Lung cancer Father       Chronic smoker  . Factor V Leiden deficiency Father     Also Lupus anticoagulant  . Heart attack Maternal Grandmother 48  . Heart failure Paternal Grandmother   . Diabetes Paternal Grandmother   . Heart attack Brother 28    X2  . Heart attack Sister 76    Deceased  . Healthy Sister     x2  . Healthy Son     x2  . Colon cancer Neg Hx   . Esophageal cancer Neg Hx   . Rectal cancer Neg Hx   . Stomach cancer Neg Hx     History   Social History  . Marital Status: Married    Spouse Name: N/A  . Number of Children: N/A  . Years of Education: N/A   Social History Main Topics  . Smoking status: Former Smoker -- 1.00 packs/day    Types: Cigarettes    Quit date: 08/11/2013  . Smokeless tobacco: Never Used  . Alcohol Use: Yes     Comment: rarely  . Drug Use: No  . Sexual Activity: Not on file   Other Topics Concern  . None   Social History Narrative   Married father of 2 boys. Lives with wife, Charles Grimes, and one son.   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   Only occasional beer and mixed drinks.   Does not exercise because he is too tired.   Review of Systems - See HPI.  All other ROS are negative.  BP 120/75 mmHg  Pulse 68  Temp(Src) 97.9 F (36.6 C) (Oral)  Ht 5\' 11"  (1.803 m)  Wt 202 lb (91.627 kg)  BMI 28.19 kg/m2  SpO2 95%  Physical Exam  Constitutional: He is oriented to person, place, and time and well-developed, well-nourished, and in no distress.  HENT:  Head: Normocephalic and atraumatic.  Eyes: Conjunctivae are normal.  Cardiovascular: Normal rate, regular rhythm, normal heart sounds and intact distal pulses.   Pulmonary/Chest: Effort normal and breath sounds normal. No respiratory distress. He has no wheezes. He has no rales. He exhibits no tenderness.  Neurological: He is alert and oriented to person, place, and time.  Skin:  Skin is warm and dry. No rash noted.  Psychiatric: Affect normal.  Vitals reviewed.    Assessment/Plan: Anxiety Anxiety and Depression.  Doing very well on increased dose of Sertraline.  Will continue same.  Valium refills. Follow-up 6 months.

## 2015-01-12 NOTE — Progress Notes (Signed)
Pre visit review using our clinic review tool, if applicable. No additional management support is needed unless otherwise documented below in the visit note. 

## 2015-01-12 NOTE — Patient Instructions (Signed)
Please continue medications as directed. As long as you are doing well, follow-up in 6 months. You will be contacted by Orthopedic Surgery for further assessment of shoulders.

## 2015-01-12 NOTE — Assessment & Plan Note (Signed)
Anxiety and Depression.  Doing very well on increased dose of Sertraline.  Will continue same.  Valium refills. Follow-up 6 months.

## 2015-02-04 ENCOUNTER — Encounter: Payer: Self-pay | Admitting: Physician Assistant

## 2015-02-04 DIAGNOSIS — G8929 Other chronic pain: Secondary | ICD-10-CM

## 2015-02-04 DIAGNOSIS — M25519 Pain in unspecified shoulder: Principal | ICD-10-CM

## 2015-02-21 ENCOUNTER — Encounter (HOSPITAL_COMMUNITY): Payer: Self-pay | Admitting: Emergency Medicine

## 2015-02-21 ENCOUNTER — Emergency Department (HOSPITAL_COMMUNITY): Payer: 59

## 2015-02-21 ENCOUNTER — Emergency Department (HOSPITAL_COMMUNITY)
Admission: EM | Admit: 2015-02-21 | Discharge: 2015-02-21 | Disposition: A | Discharge status: Home / Self Care | Payer: 59 | Attending: Emergency Medicine | Admitting: Emergency Medicine

## 2015-02-21 DIAGNOSIS — F419 Anxiety disorder, unspecified: Secondary | ICD-10-CM | POA: Diagnosis not present

## 2015-02-21 DIAGNOSIS — K219 Gastro-esophageal reflux disease without esophagitis: Secondary | ICD-10-CM | POA: Insufficient documentation

## 2015-02-21 DIAGNOSIS — F329 Major depressive disorder, single episode, unspecified: Secondary | ICD-10-CM | POA: Diagnosis not present

## 2015-02-21 DIAGNOSIS — Z880 Allergy status to penicillin: Secondary | ICD-10-CM | POA: Diagnosis not present

## 2015-02-21 DIAGNOSIS — Z87891 Personal history of nicotine dependence: Secondary | ICD-10-CM | POA: Diagnosis not present

## 2015-02-21 DIAGNOSIS — Z7982 Long term (current) use of aspirin: Secondary | ICD-10-CM | POA: Insufficient documentation

## 2015-02-21 DIAGNOSIS — Z79899 Other long term (current) drug therapy: Secondary | ICD-10-CM | POA: Insufficient documentation

## 2015-02-21 DIAGNOSIS — I10 Essential (primary) hypertension: Secondary | ICD-10-CM | POA: Diagnosis not present

## 2015-02-21 DIAGNOSIS — E785 Hyperlipidemia, unspecified: Secondary | ICD-10-CM | POA: Diagnosis not present

## 2015-02-21 DIAGNOSIS — I2510 Atherosclerotic heart disease of native coronary artery without angina pectoris: Secondary | ICD-10-CM | POA: Diagnosis not present

## 2015-02-21 DIAGNOSIS — R079 Chest pain, unspecified: Secondary | ICD-10-CM | POA: Diagnosis present

## 2015-02-21 DIAGNOSIS — E669 Obesity, unspecified: Secondary | ICD-10-CM | POA: Diagnosis not present

## 2015-02-21 LAB — CBC
HCT: 40.5 % (ref 39.0–52.0)
Hemoglobin: 13.4 g/dL (ref 13.0–17.0)
MCH: 30 pg (ref 26.0–34.0)
MCHC: 33.1 g/dL (ref 30.0–36.0)
MCV: 90.8 fL (ref 78.0–100.0)
Platelets: 144 K/uL — ABNORMAL LOW (ref 150–400)
RBC: 4.46 MIL/uL (ref 4.22–5.81)
RDW: 14.8 % (ref 11.5–15.5)
WBC: 10.3 K/uL (ref 4.0–10.5)

## 2015-02-21 LAB — BASIC METABOLIC PANEL WITH GFR
Anion gap: 7 (ref 5–15)
BUN: 18 mg/dL (ref 6–20)
CO2: 26 mmol/L (ref 22–32)
Calcium: 8.6 mg/dL — ABNORMAL LOW (ref 8.9–10.3)
Chloride: 105 mmol/L (ref 101–111)
Creatinine, Ser: 0.91 mg/dL (ref 0.61–1.24)
GFR calc Af Amer: >60 mL/min
GFR calc non Af Amer: >60 mL/min
Glucose, Bld: 100 mg/dL — ABNORMAL HIGH (ref 65–99)
Potassium: 4 mmol/L (ref 3.5–5.1)
Sodium: 138 mmol/L (ref 135–145)

## 2015-02-21 LAB — I-STAT TROPONIN, ED: Troponin i, poc: 0 ng/mL (ref 0.00–0.08)

## 2015-02-21 MED ORDER — SODIUM CHLORIDE 0.9 % IV BOLUS (SEPSIS)
500.0000 mL | Freq: Once | INTRAVENOUS | Status: AC
  Administered 2015-02-21: 500 mL via INTRAVENOUS

## 2015-02-21 NOTE — ED Provider Notes (Signed)
CSN: [id number]     Arrival date & time 02/21/15  1935 History   First MD Initiated Contact with Patient 02/21/15 1959     Chief Complaint  Patient presents with  . Chest Pain     (Consider location/radiation/quality/duration/timing/severity/associated sxs/prior Treatment) HPI   59 year old male with chest pain. Acute onset low at rest. Describes the pain is sharp in the left side of his chest. Took nitroglycerin which made him feel lightheaded and nauseated but did not improve his pain. Also took 324 mg of aspirin. No respiratory complaints. No fevers or chills. No cough. No unusual leg pain or swelling. Past history of nonobstructive coronary artery disease nonischemic cardiomyopathy.  Past Medical History  Diagnosis Date  . Dyslipidemia, goal LDL below 100   . Essential hypertension   . Obesity (BMI 30.0-34.9)   . Family history of premature CAD   . Nonischemic cardiomyopathy 05/2013    Non-ischemic: EF ~45% by Echo & Myoview --> Non-obstructive CAD  . Coronary artery disease, non-occlusive 08/2013    Mild to moderate (40% OM1) single vessel CAD. Otherwise nonobstructed.  . Anxiety   . Depression   . GERD (gastroesophageal reflux disease)   . Anxiety   . H/O skin disorder     Involving hands. Subsequently resolved.    Past Surgical History  Procedure Laterality Date  . Nm myoview ltd  06/03/2013    Low risk study with no ischemia. EF roughly 44% with no regional wall motion abnormalities noted  . Transthoracic echocardiogram  06/11/2013    Mildly reduced EF: 45-50%. Mild anterior hypokinesis with incoordinate septal motion. No evidence of pulmonary hypertension  . Lower extremity arterial dopplers  06/11/2013    No occlusive disease  . Pfts  06/2013    Normal Volumes &  Spirometry; Moderately reduced DLCO  . Left heart catheterization with coronary angiogram  08/26/2013    Mild to moderate disease with 40-50% stenosis and OM1. Otherwise no significant CAD --  Surgeon: Leonie Man, MD;  Location: [facility] CATH LAB;  Service: Cardiovascular;;  . Lumbar laminectomy/decompression microdiscectomy Right 10/07/2014    Procedure: LUMBAR LAMINECTOMY/DECOMPRESSION MICRODISCECTOMY 1 LEVEL;  Surgeon: Kary Kos, MD;  Location: [facility] NEURO ORS;  Service: Neurosurgery;  Laterality: Right;  Right L3-L4 Microdiscectomy   Family History  Problem Relation Age of Onset  . Atrial fibrillation Mother     Alive at 24  . COPD Mother   . Hypertension Mother   . Hypertension Father     Alive at 57  . Lung cancer Father     Chronic smoker  . Factor V Leiden deficiency Father     Also Lupus anticoagulant  . Heart attack Maternal Grandmother 48  . Heart failure Paternal Grandmother   . Diabetes Paternal Grandmother   . Heart attack Brother 60    X2  . Heart attack Sister 95    Deceased  . Healthy Sister     x2  . Healthy Son     x2  . Colon cancer Neg Hx   . Esophageal cancer Neg Hx   . Rectal cancer Neg Hx   . Stomach cancer Neg Hx    History  Substance Use Topics  . Smoking status: Former Smoker -- 1.00 packs/day    Types: Cigarettes    Quit date: 08/11/2013  . Smokeless tobacco: Never Used  . Alcohol Use: Yes     Comment: rarely    Review of Systems  All systems reviewed and negative, other  than as noted in HPI.   Allergies  Isosorbide; Oxycodone; and Penicillins  Home Medications   Prior to Admission medications   Medication Sig Start Date End Date Taking? Authorizing Provider  acetaminophen (TYLENOL) 325 MG tablet Take 650 mg by mouth as needed for mild pain.     Historical Provider, MD  aspirin EC 81 MG tablet Take 81 mg by mouth daily.    Historical Provider, MD  diazepam (VALIUM) 10 MG tablet Take 1 tablet (10 mg total) by mouth at bedtime. 01/12/15   Brunetta Jeans, PA-C  HYDROcodone-acetaminophen (NORCO) 10-325 MG per tablet Take 1 tablet by mouth every 6 (six) hours as needed. pain 09/12/14   Historical Provider, MD  losartan (COZAAR) 50 MG tablet Take  1 tablet (50 mg total) by mouth daily. 07/21/14   Leonie Man, MD  methocarbamol (ROBAXIN) 750 MG tablet Take 1 tablet by mouth every 4 (four) hours as needed. 01/06/15   Historical Provider, MD  nitroGLYCERIN (NITROSTAT) 0.4 MG SL tablet Place 1 tablet (0.4 mg total) under the tongue every 5 (five) minutes as needed for chest pain. 06/10/14   Brunetta Jeans, PA-C  omeprazole (PRILOSEC OTC) 20 MG tablet Take 20 mg by mouth daily.    Historical Provider, MD  pravastatin (PRAVACHOL) 20 MG tablet TAKE 1 TABLET (20 MG TOTAL) BY MOUTH DAILY. 12/29/14   Brunetta Jeans, PA-C  sertraline (ZOLOFT) 100 MG tablet Take 1 tablet (100 mg total) by mouth daily. 01/12/15   Brunetta Jeans, PA-C   BP 102/52 mmHg  Pulse 61  Temp(Src) 97.5 F (36.4 C) (Oral)  Resp 20  Ht 5\' 11"  (1.803 m)  Wt 200 lb (90.719 kg)  BMI 27.91 kg/m2  SpO2 97% Physical Exam  Constitutional: He appears well-developed and well-nourished. No distress.  HENT:  Head: Normocephalic and atraumatic.  Eyes: Conjunctivae are normal. Right eye exhibits no discharge. Left eye exhibits no discharge.  Neck: Neck supple.  Cardiovascular: Normal rate, regular rhythm and normal heart sounds.  Exam reveals no gallop and no friction rub.   No murmur heard. Pulmonary/Chest: Effort normal and breath sounds normal. No respiratory distress.  Abdominal: Soft. He exhibits no distension. There is no tenderness.  Musculoskeletal: He exhibits no edema or tenderness.  Lower extremities symmetric as compared to each other. No calf tenderness. Negative Homan's. No palpable cords.   Neurological: He is alert.  Skin: Skin is warm and dry.  Psychiatric: He has a normal mood and affect. His behavior is normal. Thought content normal.  Nursing note and vitals reviewed.   ED Course  Procedures (including critical care time) Labs Review Labs Reviewed  CBC - Abnormal; Notable for the following:    Platelets 144 (*)    All other components within normal  limits  BASIC METABOLIC PANEL  I-STAT TROPOININ, ED    Imaging Review Dg Chest 2 View  02/21/2015   CLINICAL DATA:  Left-sided chest and left arm pain began today.  EXAM: CHEST  2 VIEW  COMPARISON:  05/16/2014  FINDINGS: The cardiac silhouette, mediastinal and hilar contours are within normal limits. The lungs are clear. No pleural effusion. The bony thorax is intact.  IMPRESSION: No acute cardiopulmonary findings.   Electronically Signed   By: Marijo Sanes M.D.   On: 02/21/2015 20:17     EKG Interpretation   Date/Time:  Sunday February 21 2015 19:47:11 EDT Ventricular Rate:  55 PR Interval:  151 QRS Duration: 98 QT Interval:  441  QTC Calculation: 422 R Axis:   48 Text Interpretation:  Sinus rhythm No significant change since last  tracing Confirmed by Jermone Geister  MD, Margi Edmundson ([id number]) on 02/21/2015 9:43:37 PM      MDM   Final diagnoses:  Chest pain, unspecified chest pain type    59yM with CP. Now resolved. Onset while at rest. Cath 18 months ago with mild-moderate single vessel CAD, 40% OM1. EKG w/o concerning changes. Troponin normal. Doubt ACS. Doubt PE, serious infection, dissection or other emergent pathology.     Virgel Manifold, MD 03/01/15 [phone]

## 2015-02-21 NOTE — ED Notes (Signed)
Patient transported to X-ray 

## 2015-02-21 NOTE — Discharge Instructions (Signed)

## 2015-02-21 NOTE — ED Notes (Signed)
Per EMS: pt from home for sudden onset of sharp left sided cp with radiation to jaw x45 minutes. Pt states he took 2 nitro at one time and began to feel dizzy, lightheaded, and  Had some nausea. Pt states pain is 5/10 currently. Pt given 1 nitro en route and 324mg  of aspirin in which pt became hypotensive with last BP 88/50. Pt in nad at this time.

## 2015-03-14 ENCOUNTER — Encounter: Payer: Self-pay | Admitting: Internal Medicine

## 2015-04-15 ENCOUNTER — Telehealth: Payer: Self-pay | Admitting: *Deleted

## 2015-04-15 NOTE — Telephone Encounter (Signed)
Patient scheduled for 04/30/2015 for surgical clearance

## 2015-04-15 NOTE — Telephone Encounter (Signed)
Surgical clearance form received from [facility] for left shoulder scope, SAD, open biceps tenodesis, open RCR, BICEPS TENODESIS-open, SA-SAD-SCOPE w/ partial acromioplasty w/wo coracoacromial release.   Please call and schedule a surgical clearance appt. Thanks, JG//CMA

## 2015-04-30 ENCOUNTER — Encounter: Payer: Self-pay | Admitting: Physician Assistant

## 2015-04-30 ENCOUNTER — Ambulatory Visit (INDEPENDENT_AMBULATORY_CARE_PROVIDER_SITE_OTHER): Payer: 59 | Admitting: Physician Assistant

## 2015-04-30 VITALS — BP 101/61 | HR 70 | Temp 98.1°F | Resp 16 | Ht 71.0 in | Wt 197.2 lb

## 2015-04-30 DIAGNOSIS — Z136 Encounter for screening for cardiovascular disorders: Secondary | ICD-10-CM

## 2015-04-30 DIAGNOSIS — Z01818 Encounter for other preprocedural examination: Secondary | ICD-10-CM | POA: Diagnosis not present

## 2015-04-30 LAB — BASIC METABOLIC PANEL
BUN: 24 mg/dL (ref 7–25)
CALCIUM: 8.7 mg/dL (ref 8.6–10.3)
CO2: 23 mmol/L (ref 20–31)
Chloride: 104 mmol/L (ref 98–110)
Creat: 0.84 mg/dL (ref 0.70–1.33)
Glucose, Bld: 82 mg/dL (ref 65–99)
Potassium: 4.4 mmol/L (ref 3.5–5.3)
SODIUM: 136 mmol/L (ref 135–146)

## 2015-04-30 LAB — CBC
HCT: 44.1 % (ref 39.0–52.0)
HEMOGLOBIN: 14.9 g/dL (ref 13.0–17.0)
MCH: 30.3 pg (ref 26.0–34.0)
MCHC: 33.8 g/dL (ref 30.0–36.0)
MCV: 89.6 fL (ref 78.0–100.0)
MPV: 9.7 fL (ref 8.6–12.4)
Platelets: 206 10*3/uL (ref 150–400)
RBC: 4.92 MIL/uL (ref 4.22–5.81)
RDW: 13.9 % (ref 11.5–15.5)
WBC: 11.9 10*3/uL — ABNORMAL HIGH (ref 4.0–10.5)

## 2015-04-30 NOTE — Progress Notes (Signed)
Pre visit review using our clinic review tool, if applicable. No additional management support is needed unless otherwise documented below in the visit note/SLS  

## 2015-04-30 NOTE — Progress Notes (Signed)
Patient presents to clinic today for preoperative clearance of an arthoscopic procedure of left shoulder.  Has not been scheduled yet. Patient with history of non-obstructive CAD. Patient denies chest pain, palpitations, lightheadedness, dizziness, vision changes or frequent headaches. Denies fever, chills,malaise or fatigue.  Past Medical History  Diagnosis Date  . Dyslipidemia, goal LDL below 100   . Essential hypertension   . Obesity (BMI 30.0-34.9)   . Family history of premature CAD   . Nonischemic cardiomyopathy ([place]) 05/2013    Non-ischemic: EF ~45% by Echo & Myoview --> Non-obstructive CAD  . Coronary artery disease, non-occlusive 08/2013    Mild to moderate (40% OM1) single vessel CAD. Otherwise nonobstructed.  . Anxiety   . Depression   . GERD (gastroesophageal reflux disease)   . Anxiety   . H/O skin disorder     Involving hands. Subsequently resolved.     Current Outpatient Prescriptions on File Prior to Visit  Medication Sig Dispense Refill  . acetaminophen (TYLENOL) 325 MG tablet Take 650 mg by mouth as needed for mild pain.     Marland Kitchen aspirin EC 81 MG tablet Take 81 mg by mouth daily.    . diazepam (VALIUM) 10 MG tablet Take 1 tablet (10 mg total) by mouth at bedtime. 30 tablet 2  . HYDROcodone-acetaminophen (NORCO) 10-325 MG per tablet Take 1 tablet by mouth every 6 (six) hours as needed. pain  0  . losartan (COZAAR) 50 MG tablet Take 1 tablet (50 mg total) by mouth daily. 30 tablet 11  . methocarbamol (ROBAXIN) 750 MG tablet Take 1 tablet by mouth every 4 (four) hours as needed.  1  . nitroGLYCERIN (NITROSTAT) 0.4 MG SL tablet Place 1 tablet (0.4 mg total) under the tongue every 5 (five) minutes as needed for chest pain. 30 tablet 1  . omeprazole (PRILOSEC OTC) 20 MG tablet Take 20 mg by mouth daily.    . pravastatin (PRAVACHOL) 20 MG tablet TAKE 1 TABLET (20 MG TOTAL) BY MOUTH DAILY. 90 tablet 1  . sertraline (ZOLOFT) 100 MG tablet Take 1 tablet (100 mg total) by  mouth daily. 30 tablet 5   No current facility-administered medications on file prior to visit.    Allergies  Allergen Reactions  . Isosorbide Other (See Comments)    CONTINUOUS HEADACHE   . Oxycodone Itching  . Penicillins Hives and Rash    Family History  Problem Relation Age of Onset  . Atrial fibrillation Mother     Alive at 14  . COPD Mother   . Hypertension Mother   . Hypertension Father     Alive at 80  . Lung cancer Father     Chronic smoker  . Factor V Leiden deficiency Father     Also Lupus anticoagulant  . Heart attack Maternal Grandmother 48  . Heart failure Paternal Grandmother   . Diabetes Paternal Grandmother   . Heart attack Brother 83    X2  . Heart attack Sister 45    Deceased  . Healthy Sister     x2  . Healthy Son     x2  . Colon cancer Neg Hx   . Esophageal cancer Neg Hx   . Rectal cancer Neg Hx   . Stomach cancer Neg Hx     Social History   Social History  . Marital Status: Married    Spouse Name: N/A  . Number of Children: N/A  . Years of Education: N/A   Social History Main  Topics  . Smoking status: Former Smoker -- 1.00 packs/day    Types: Cigarettes    Quit date: 08/11/2013  . Smokeless tobacco: Never Used  . Alcohol Use: Yes     Comment: rarely  . Drug Use: No  . Sexual Activity: Not Asked   Other Topics Concern  . None   Social History Narrative   Married father of 2 boys. Lives with wife, [name], and one son.   He works as a Designer, television/film set for [employer]   He currently smokes one pack a day, cut down from 2 packs per day.   Only occasional beer and mixed drinks.   Does not exercise because he is too tired.    Review of Systems - See HPI.  All other ROS are negative.  BP 101/61 mmHg  Pulse 70  Temp(Src) 98.1 F (36.7 C) (Oral)  Resp 16  Ht '5\' 11"'  (1.803 m)  Wt 197 lb 4 oz (89.472 kg)  BMI 27.52 kg/m2  SpO2 97%  Physical Exam  Constitutional: He is oriented  to person, place, and time and well-developed, well-nourished, and in no distress.  HENT:  Head: Normocephalic and atraumatic.  Eyes: Conjunctivae are normal.  Cardiovascular: Normal rate, regular rhythm, normal heart sounds and intact distal pulses.   Pulmonary/Chest: Effort normal and breath sounds normal. No respiratory distress. He has no wheezes. He has no rales. He exhibits no tenderness.  Neurological: He is alert and oriented to person, place, and time.  Skin: Skin is warm and dry. No rash noted.  Psychiatric: Affect normal.  Vitals reviewed.   Recent Results (from the past 2160 hour(s))  Basic metabolic panel     Status: Abnormal   Collection Time: 02/21/15  8:00 PM  Result Value Ref Range   Sodium 138 135 - 145 mmol/L   Potassium 4.0 3.5 - 5.1 mmol/L   Chloride 105 101 - 111 mmol/L   CO2 26 22 - 32 mmol/L   Glucose, Bld 100 (H) 65 - 99 mg/dL   BUN 18 6 - 20 mg/dL   Creatinine, Ser 0.91 0.61 - 1.24 mg/dL   Calcium 8.6 (L) 8.9 - 10.3 mg/dL   GFR calc non Af Amer >60 >60 mL/min   GFR calc Af Amer >60 >60 mL/min    Comment: (NOTE) The eGFR has been calculated using the CKD EPI equation. This calculation has not been validated in all clinical situations. eGFR's persistently <60 mL/min signify possible Chronic Kidney Disease.    Anion gap 7 5 - 15  CBC     Status: Abnormal   Collection Time: 02/21/15  8:00 PM  Result Value Ref Range   WBC 10.3 4.0 - 10.5 K/uL   RBC 4.46 4.22 - 5.81 MIL/uL   Hemoglobin 13.4 13.0 - 17.0 g/dL   HCT 40.5 39.0 - 52.0 %   MCV 90.8 78.0 - 100.0 fL   MCH 30.0 26.0 - 34.0 pg   MCHC 33.1 30.0 - 36.0 g/dL   RDW 14.8 11.5 - 15.5 %   Platelets 144 (L) 150 - 400 K/uL  I-stat troponin, ED     Status: None   Collection Time: 02/21/15  8:04 PM  Result Value Ref Range   Troponin i, poc 0.00 0.00 - 0.08 ng/mL   Comment 3            Comment: Due to the release kinetics of cTnI, a negative result within the first hours of the  onset of symptoms does  not rule out myocardial infarction with certainty. If myocardial infarction is still suspected, repeat the test at appropriate intervals.     Assessment/Plan: Preoperative clearance Exam without abnormal findings. EKG with NSR. Will obtain lab panel. Will grant clearance based on results and fax over respective paperwork.

## 2015-04-30 NOTE — Patient Instructions (Signed)
Please continue medications as directed. Stop by the lab for blood work. I will call you with your results.  As long as everything looks good you are cleared for surgery.  I will fax everything over to [facility] once completed.

## 2015-04-30 NOTE — Addendum Note (Signed)
Addended by: Harl Bowie on: 04/30/2015 05:37 PM   Modules accepted: Orders

## 2015-04-30 NOTE — Assessment & Plan Note (Signed)
Exam without abnormal findings. EKG with NSR. Will obtain lab panel. Will grant clearance based on results and fax over respective paperwork.

## 2015-05-01 LAB — CULTURE, URINE COMPREHENSIVE
COLONY COUNT: NO GROWTH
Organism ID, Bacteria: NO GROWTH

## 2015-05-01 LAB — PROTIME-INR
INR: 0.97 (ref <1.50)
Prothrombin Time: 13 seconds (ref 11.6–15.2)

## 2015-05-03 ENCOUNTER — Telehealth: Payer: Self-pay | Admitting: Physician Assistant

## 2015-05-03 DIAGNOSIS — D72829 Elevated white blood cell count, unspecified: Secondary | ICD-10-CM

## 2015-05-03 NOTE — Telephone Encounter (Signed)
Results discussed with patient. Has restarted smoking some. Discussed cessation and patient agrees. Will repeat CBC on Friday.

## 2015-05-07 ENCOUNTER — Other Ambulatory Visit (INDEPENDENT_AMBULATORY_CARE_PROVIDER_SITE_OTHER): Payer: 59

## 2015-05-07 DIAGNOSIS — D72829 Elevated white blood cell count, unspecified: Secondary | ICD-10-CM

## 2015-05-07 LAB — CBC
HCT: 43.5 % (ref 39.0–52.0)
HEMOGLOBIN: 14.3 g/dL (ref 13.0–17.0)
MCHC: 33 g/dL (ref 30.0–36.0)
MCV: 91.2 fl (ref 78.0–100.0)
Platelets: 146 10*3/uL — ABNORMAL LOW (ref 150.0–400.0)
RBC: 4.77 Mil/uL (ref 4.22–5.81)
RDW: 14.1 % (ref 11.5–15.5)
WBC: 8.9 10*3/uL (ref 4.0–10.5)

## 2015-05-08 ENCOUNTER — Encounter: Payer: Self-pay | Admitting: Physician Assistant

## 2015-05-18 NOTE — H&P (Signed)
Charles Grimes is an 59 y.o. male.    Chief Complaint: right shoulder pain  HPI: Pt is a 59 y.o. male complaining of right shoulder pain for multiple years. Pain had continually increased since the beginning. X-rays in the clinic show rotator cuff arthropathy. Pt has tried various conservative treatments which have failed to alleviate their symptoms, including injections and therapy. Various options are discussed with the patient. Risks, benefits and expectations were discussed with the patient. Patient understand the risks, benefits and expectations and wishes to proceed with surgery.   PCP:  Leeanne Rio, PA-C  D/C Plans: Home  PMH: Past Medical History  Diagnosis Date  . Dyslipidemia, goal LDL below 100   . Essential hypertension   . Obesity (BMI 30.0-34.9)   . Family history of premature CAD   . Nonischemic cardiomyopathy ([place]) 05/2013    Non-ischemic: EF ~45% by Echo & Myoview --> Non-obstructive CAD  . Coronary artery disease, non-occlusive 08/2013    Mild to moderate (40% OM1) single vessel CAD. Otherwise nonobstructed.  . Anxiety   . Depression   . GERD (gastroesophageal reflux disease)   . Anxiety   . H/O skin disorder     Involving hands. Subsequently resolved.     PSH: Past Surgical History  Procedure Laterality Date  . Nm myoview ltd  06/03/2013    Low risk study with no ischemia. EF roughly 44% with no regional wall motion abnormalities noted  . Transthoracic echocardiogram  06/11/2013    Mildly reduced EF: 45-50%. Mild anterior hypokinesis with incoordinate septal motion. No evidence of pulmonary hypertension  . Lower extremity arterial dopplers  06/11/2013    No occlusive disease  . Pfts  06/2013    Normal Volumes &  Spirometry; Moderately reduced DLCO  . Left heart catheterization with coronary angiogram  08/26/2013    Mild to moderate disease with 40-50% stenosis and OM1. Otherwise no significant CAD --  Surgeon: Leonie Man, MD;  Location: [facility]  CATH LAB;  Service: Cardiovascular;;  . Lumbar laminectomy/decompression microdiscectomy Right 10/07/2014    Procedure: LUMBAR LAMINECTOMY/DECOMPRESSION MICRODISCECTOMY 1 LEVEL;  Surgeon: Kary Kos, MD;  Location: [facility] NEURO ORS;  Service: Neurosurgery;  Laterality: Right;  Right L3-L4 Microdiscectomy    Social History:  reports that he quit smoking about 21 months ago. His smoking use included Cigarettes. He smoked 1.00 pack per day. He has never used smokeless tobacco. He reports that he drinks alcohol. He reports that he does not use illicit drugs.  Allergies:  Allergies  Allergen Reactions  . Isosorbide Other (See Comments)    CONTINUOUS HEADACHE   . Oxycodone Itching  . Penicillins Hives and Rash    Medications: No current facility-administered medications for this encounter.   Current Outpatient Prescriptions  Medication Sig Dispense Refill  . acetaminophen (TYLENOL) 325 MG tablet Take 650 mg by mouth as needed for mild pain.     Marland Kitchen aspirin EC 81 MG tablet Take 81 mg by mouth daily.    . diazepam (VALIUM) 10 MG tablet Take 1 tablet (10 mg total) by mouth at bedtime. 30 tablet 2  . HYDROcodone-acetaminophen (NORCO) 10-325 MG per tablet Take 1 tablet by mouth every 6 (six) hours as needed. pain  0  . losartan (COZAAR) 50 MG tablet Take 1 tablet (50 mg total) by mouth daily. 30 tablet 11  . methocarbamol (ROBAXIN) 750 MG tablet Take 1 tablet by mouth every 4 (four) hours as needed.  1  . nitroGLYCERIN (NITROSTAT) 0.4  MG SL tablet Place 1 tablet (0.4 mg total) under the tongue every 5 (five) minutes as needed for chest pain. 30 tablet 1  . omeprazole (PRILOSEC OTC) 20 MG tablet Take 20 mg by mouth daily.    . pravastatin (PRAVACHOL) 20 MG tablet TAKE 1 TABLET (20 MG TOTAL) BY MOUTH DAILY. 90 tablet 1  . sertraline (ZOLOFT) 100 MG tablet Take 1 tablet (100 mg total) by mouth daily. 30 tablet 5    No results found for this or any previous visit (from the past 48 hour(s)). No results  found.  ROS: Pain with rom of the right upper extremity  Physical Exam:  Alert and oriented 59 y.o. male in no acute distress Cranial nerves 2-12 intact Cervical spine: full rom with no tenderness, nv intact distally Chest: active breath sounds bilaterally, no wheeze rhonchi or rales Heart: regular rate and rhythm, no murmur Abd: non tender non distended with active bowel sounds Hip is stable with rom  Right shoulder with very limited rom and strength due to cuff arthropathy nv intact distally Crepitus with rom No rashes or edema  Assessment/Plan Assessment: right rotator cuff arthropathy  Plan: Patient will undergo a right shoulder hemi arthroplasty by Dr. Veverly Fells at [facility]. Risks benefits and expectations were discussed with the patient. Patient understand risks, benefits and expectations and wishes to proceed.

## 2015-05-21 ENCOUNTER — Encounter (HOSPITAL_COMMUNITY): Payer: Self-pay

## 2015-05-21 ENCOUNTER — Encounter (HOSPITAL_COMMUNITY)
Admission: RE | Admit: 2015-05-21 | Discharge: 2015-05-21 | Disposition: A | Discharge status: Home / Self Care | Payer: 59 | Source: Ambulatory Visit | Attending: Orthopedic Surgery | Admitting: Orthopedic Surgery

## 2015-05-21 DIAGNOSIS — Z01812 Encounter for preprocedural laboratory examination: Secondary | ICD-10-CM | POA: Insufficient documentation

## 2015-05-21 DIAGNOSIS — M75100 Unspecified rotator cuff tear or rupture of unspecified shoulder, not specified as traumatic: Secondary | ICD-10-CM | POA: Diagnosis not present

## 2015-05-21 HISTORY — DX: Angina pectoris, unspecified: I20.9

## 2015-05-21 HISTORY — DX: Unspecified osteoarthritis, unspecified site: M19.90

## 2015-05-21 LAB — BASIC METABOLIC PANEL
Anion gap: 8 (ref 5–15)
BUN: 15 mg/dL (ref 6–20)
CHLORIDE: 107 mmol/L (ref 101–111)
CO2: 24 mmol/L (ref 22–32)
CREATININE: 0.99 mg/dL (ref 0.61–1.24)
Calcium: 8.9 mg/dL (ref 8.9–10.3)
Glucose, Bld: 95 mg/dL (ref 65–99)
Potassium: 4.1 mmol/L (ref 3.5–5.1)
SODIUM: 139 mmol/L (ref 135–145)

## 2015-05-21 LAB — SURGICAL PCR SCREEN
MRSA, PCR: NEGATIVE
STAPHYLOCOCCUS AUREUS: POSITIVE — AB

## 2015-05-21 LAB — CBC
HCT: 43.5 % (ref 39.0–52.0)
HEMOGLOBIN: 14.3 g/dL (ref 13.0–17.0)
MCH: 30.4 pg (ref 26.0–34.0)
MCHC: 32.9 g/dL (ref 30.0–36.0)
MCV: 92.4 fL (ref 78.0–100.0)
PLATELETS: 179 10*3/uL (ref 150–400)
RBC: 4.71 MIL/uL (ref 4.22–5.81)
RDW: 14.3 % (ref 11.5–15.5)
WBC: 9.6 10*3/uL (ref 4.0–10.5)

## 2015-05-21 NOTE — Progress Notes (Signed)
I called a prescription for Mupirocin ointment to [facility], [place], [place] 

## 2015-05-21 NOTE — Pre-Procedure Instructions (Addendum)
Charles Grimes  05/21/2015      [id number], [place] [address] Phone: [phone] Fax: [phone]    Your procedure is scheduled on   Friday  05/28/15  Report to [facility] Admitting at 1100 A.M.  Call this number if you have problems the morning of surgery:  [phone]   Remember:  Do not eat food or drink liquids after midnight.  Take these medicines the morning of surgery with A SIP OF WATER     HYROCODONE IF NEEDED, OMEPRAZOLE, SERTRALINE  (STOP ASPIRIN, ASPERCREME, NO IBUPROFEN, GOODY POWDER'S/ BC'S, OR HERBAL MEDICINES)   Do not wear jewelry, make-up or nail polish.  Do not wear lotions, powders, or perfumes.  You may wear deodorant.  Do not shave 48 hours prior to surgery.  Men may shave face and neck.  Do not bring valuables to the hospital.  [facility] is not responsible for any belongings or valuables.  Contacts, dentures or bridgework may not be worn into surgery.  Leave your suitcase in the car.  After surgery it may be brought to your room.  For patients admitted to the hospital, discharge time will be determined by your treatment team.  Patients discharged the day of surgery will not be allowed to drive home.   Name and phone number of your driver:   Special instructions: [facility] - Preparing for Surgery  Before surgery, you can play an important role.  Because skin is not sterile, your skin needs to be as free of germs as possible.  You can reduce the number of germs on you skin by washing with CHG (chlorahexidine gluconate) soap before surgery.  CHG is an antiseptic cleaner which kills germs and bonds with the skin to continue killing germs even after washing.  Please DO NOT use if you have an allergy to CHG or antibacterial soaps.  If your skin becomes reddened/irritated stop using the CHG and inform your nurse when you arrive at Short Stay.  Do not  shave (including legs and underarms) for at least 48 hours prior to the first CHG shower.  You may shave your face.  Please follow these instructions carefully:   1.  Shower with CHG Soap the night before surgery and the                                morning of Surgery.  2.  If you choose to wash your hair, wash your hair first as usual with your       normal shampoo.  3.  After you shampoo, rinse your hair and body thoroughly to remove the                      Shampoo.  4.  Use CHG as you would any other liquid soap.  You can apply chg directly       to the skin and wash gently with scrungie or a clean washcloth.  5.  Apply the CHG Soap to your body ONLY FROM THE NECK DOWN.        Do not use on open wounds or open sores.  Avoid contact with your eyes,       ears, mouth and genitals (private parts).  Wash genitals (private parts)       with your normal soap.  6.  Wash thoroughly, paying special attention to the area where your surgery        will be performed.  7.  Thoroughly rinse your body with warm water from the neck down.  8.  DO NOT shower/wash with your normal soap after using and rinsing off       the CHG Soap.  9.  Pat yourself dry with a clean towel.            10.  Wear clean pajamas.            11.  Place clean sheets on your bed the night of your first shower and do not        sleep with pets.  Day of Surgery  Do not apply any lotions/deoderants the morning of surgery.  Please wear clean clothes to the hospital/surgery center.    Please read over the following fact sheets that you were given. Pain Booklet, Coughing and Deep Breathing, Blood Transfusion Information, MRSA Information and Surgical Site Infection Prevention

## 2015-05-27 MED ORDER — CLINDAMYCIN PHOSPHATE 900 MG/50ML IV SOLN
900.0000 mg | INTRAVENOUS | Status: AC
  Administered 2015-05-28: 900 mg via INTRAVENOUS
  Filled 2015-05-27: qty 50

## 2015-05-28 ENCOUNTER — Inpatient Hospital Stay (HOSPITAL_COMMUNITY)
Admission: RE | Admit: 2015-05-28 | Discharge: 2015-05-29 | DRG: 508 | Disposition: A | Discharge status: Home / Self Care | Payer: 59 | Source: Ambulatory Visit | Attending: Orthopedic Surgery | Admitting: Orthopedic Surgery

## 2015-05-28 ENCOUNTER — Inpatient Hospital Stay (HOSPITAL_COMMUNITY): Payer: 59

## 2015-05-28 ENCOUNTER — Inpatient Hospital Stay (HOSPITAL_COMMUNITY): Payer: 59 | Admitting: Certified Registered Nurse Anesthetist

## 2015-05-28 ENCOUNTER — Encounter (HOSPITAL_COMMUNITY)
Admission: RE | Disposition: A | Discharge status: Home / Self Care | Payer: Self-pay | Source: Ambulatory Visit | Attending: Orthopedic Surgery

## 2015-05-28 ENCOUNTER — Encounter (HOSPITAL_COMMUNITY): Payer: Self-pay | Admitting: *Deleted

## 2015-05-28 DIAGNOSIS — F329 Major depressive disorder, single episode, unspecified: Secondary | ICD-10-CM | POA: Diagnosis present

## 2015-05-28 DIAGNOSIS — I2510 Atherosclerotic heart disease of native coronary artery without angina pectoris: Secondary | ICD-10-CM | POA: Diagnosis present

## 2015-05-28 DIAGNOSIS — S46011A Strain of muscle(s) and tendon(s) of the rotator cuff of right shoulder, initial encounter: Principal | ICD-10-CM | POA: Diagnosis present

## 2015-05-28 DIAGNOSIS — M25512 Pain in left shoulder: Secondary | ICD-10-CM | POA: Diagnosis present

## 2015-05-28 DIAGNOSIS — K219 Gastro-esophageal reflux disease without esophagitis: Secondary | ICD-10-CM | POA: Diagnosis present

## 2015-05-28 DIAGNOSIS — Z96619 Presence of unspecified artificial shoulder joint: Secondary | ICD-10-CM

## 2015-05-28 DIAGNOSIS — Y929 Unspecified place or not applicable: Secondary | ICD-10-CM

## 2015-05-28 DIAGNOSIS — E785 Hyperlipidemia, unspecified: Secondary | ICD-10-CM | POA: Diagnosis present

## 2015-05-28 DIAGNOSIS — Z87891 Personal history of nicotine dependence: Secondary | ICD-10-CM

## 2015-05-28 DIAGNOSIS — X58XXXA Exposure to other specified factors, initial encounter: Secondary | ICD-10-CM | POA: Diagnosis present

## 2015-05-28 DIAGNOSIS — I10 Essential (primary) hypertension: Secondary | ICD-10-CM | POA: Diagnosis present

## 2015-05-28 DIAGNOSIS — Z7982 Long term (current) use of aspirin: Secondary | ICD-10-CM | POA: Diagnosis not present

## 2015-05-28 DIAGNOSIS — Z96611 Presence of right artificial shoulder joint: Secondary | ICD-10-CM

## 2015-05-28 HISTORY — PX: SHOULDER HEMI-ARTHROPLASTY: SHX5049

## 2015-05-28 SURGERY — HEMIARTHROPLASTY, SHOULDER
Anesthesia: General | Site: Shoulder | Laterality: Right

## 2015-05-28 MED ORDER — DOCUSATE SODIUM 100 MG PO CAPS
100.0000 mg | ORAL_CAPSULE | Freq: Two times a day (BID) | ORAL | Status: DC
  Administered 2015-05-28: 100 mg via ORAL
  Filled 2015-05-28: qty 1

## 2015-05-28 MED ORDER — SUCCINYLCHOLINE CHLORIDE 20 MG/ML IJ SOLN
INTRAMUSCULAR | Status: AC
Start: 2015-05-28 — End: 2015-05-28
  Filled 2015-05-28: qty 1

## 2015-05-28 MED ORDER — GLYCOPYRROLATE 0.2 MG/ML IJ SOLN
INTRAMUSCULAR | Status: DC | PRN
  Administered 2015-05-28: 0.2 mg via INTRAVENOUS

## 2015-05-28 MED ORDER — LIDOCAINE HCL 1 % IJ SOLN
INTRAMUSCULAR | Status: DC | PRN
  Administered 2015-05-28: 4 mL

## 2015-05-28 MED ORDER — FENTANYL CITRATE (PF) 100 MCG/2ML IJ SOLN
INTRAMUSCULAR | Status: AC
  Filled 2015-05-28: qty 2

## 2015-05-28 MED ORDER — EPHEDRINE SULFATE 50 MG/ML IJ SOLN
INTRAMUSCULAR | Status: DC | PRN
  Administered 2015-05-28: 10 mg via INTRAVENOUS

## 2015-05-28 MED ORDER — NITROGLYCERIN 0.4 MG SL SUBL: 0.4000 mg | SUBLINGUAL_TABLET | SUBLINGUAL | Status: DC | PRN

## 2015-05-28 MED ORDER — GLYCOPYRROLATE 0.2 MG/ML IJ SOLN
INTRAMUSCULAR | Status: AC
  Filled 2015-05-28: qty 1

## 2015-05-28 MED ORDER — METHOCARBAMOL 1000 MG/10ML IJ SOLN
500.0000 mg | Freq: Four times a day (QID) | INTRAVENOUS | Status: DC | PRN
  Filled 2015-05-28: qty 5

## 2015-05-28 MED ORDER — HYDROMORPHONE HCL 1 MG/ML IJ SOLN: 0.2500 mg | INTRAMUSCULAR | Status: DC | PRN

## 2015-05-28 MED ORDER — FENTANYL CITRATE (PF) 100 MCG/2ML IJ SOLN
50.0000 ug | INTRAMUSCULAR | Status: DC | PRN
  Administered 2015-05-28 (×2): 50 ug via INTRAVENOUS

## 2015-05-28 MED ORDER — PROPOFOL 10 MG/ML IV BOLUS
INTRAVENOUS | Status: DC | PRN
  Administered 2015-05-28: 150 mg via INTRAVENOUS

## 2015-05-28 MED ORDER — LIDOCAINE 4 % EX CREA
1.0000 "application " | TOPICAL_CREAM | Freq: Two times a day (BID) | CUTANEOUS | Status: DC | PRN
  Filled 2015-05-28: qty 5

## 2015-05-28 MED ORDER — ONDANSETRON HCL 4 MG PO TABS: 4.0000 mg | ORAL_TABLET | Freq: Four times a day (QID) | ORAL | Status: DC | PRN

## 2015-05-28 MED ORDER — ACETAMINOPHEN 325 MG PO TABS: 650.0000 mg | ORAL_TABLET | Freq: Four times a day (QID) | ORAL | Status: DC | PRN

## 2015-05-28 MED ORDER — ONDANSETRON HCL 4 MG/2ML IJ SOLN
INTRAMUSCULAR | Status: DC | PRN
Start: 2015-05-28 — End: 2015-05-28
  Administered 2015-05-28: 4 mg via INTRAVENOUS

## 2015-05-28 MED ORDER — BUPIVACAINE HCL 0.5 % IJ SOLN
INTRAMUSCULAR | Status: DC | PRN
  Administered 2015-05-28: 4 mL

## 2015-05-28 MED ORDER — MIDAZOLAM HCL 2 MG/2ML IJ SOLN
1.0000 mg | INTRAMUSCULAR | Status: DC | PRN
  Administered 2015-05-28: 1 mg via INTRAVENOUS

## 2015-05-28 MED ORDER — CLINDAMYCIN PHOSPHATE 600 MG/50ML IV SOLN
600.0000 mg | Freq: Four times a day (QID) | INTRAVENOUS | Status: AC
  Administered 2015-05-28 – 2015-05-29 (×3): 600 mg via INTRAVENOUS
  Filled 2015-05-28 (×3): qty 50

## 2015-05-28 MED ORDER — POLYETHYLENE GLYCOL 3350 17 G PO PACK: 17.0000 g | PACK | Freq: Every day | ORAL | Status: DC | PRN

## 2015-05-28 MED ORDER — BUPIVACAINE HCL (PF) 0.5 % IJ SOLN
INTRAMUSCULAR | Status: AC
  Filled 2015-05-28: qty 10

## 2015-05-28 MED ORDER — PHENOL 1.4 % MT LIQD: 1.0000 | OROMUCOSAL | Status: DC | PRN

## 2015-05-28 MED ORDER — MENTHOL 3 MG MT LOZG: 1.0000 | LOZENGE | OROMUCOSAL | Status: DC | PRN

## 2015-05-28 MED ORDER — HYDROMORPHONE HCL 2 MG PO TABS
2.0000 mg | ORAL_TABLET | ORAL | Status: DC | PRN
Start: 2015-05-28 — End: 2015-10-25

## 2015-05-28 MED ORDER — SERTRALINE HCL 100 MG PO TABS: 100.0000 mg | ORAL_TABLET | Freq: Every day | ORAL | Status: DC

## 2015-05-28 MED ORDER — ASPIRIN EC 81 MG PO TBEC: 81.0000 mg | DELAYED_RELEASE_TABLET | Freq: Every day | ORAL | Status: DC

## 2015-05-28 MED ORDER — PROMETHAZINE HCL 25 MG/ML IJ SOLN
6.2500 mg | INTRAMUSCULAR | Status: DC | PRN
Start: 2015-05-28 — End: 2015-05-28

## 2015-05-28 MED ORDER — CHLORHEXIDINE GLUCONATE 4 % EX LIQD: 60.0000 mL | Freq: Once | CUTANEOUS | Status: DC

## 2015-05-28 MED ORDER — LIDOCAINE HCL (PF) 1 % IJ SOLN
INTRAMUSCULAR | Status: AC
  Filled 2015-05-28: qty 30

## 2015-05-28 MED ORDER — BUPIVACAINE-EPINEPHRINE (PF) 0.25% -1:200000 IJ SOLN
INTRAMUSCULAR | Status: DC | PRN
  Administered 2015-05-28: 9 mL via PERINEURAL

## 2015-05-28 MED ORDER — BUPIVACAINE-EPINEPHRINE (PF) 0.25% -1:200000 IJ SOLN
INTRAMUSCULAR | Status: AC
  Filled 2015-05-28: qty 30

## 2015-05-28 MED ORDER — LIDOCAINE-EPINEPHRINE (PF) 1.5 %-1:200000 IJ SOLN
INTRAMUSCULAR | Status: DC | PRN
  Administered 2015-05-28: 15 mL via PERINEURAL

## 2015-05-28 MED ORDER — LACTATED RINGERS IV SOLN
INTRAVENOUS | Status: DC
  Administered 2015-05-28 (×2): via INTRAVENOUS

## 2015-05-28 MED ORDER — LIDOCAINE HCL (CARDIAC) 20 MG/ML IV SOLN
INTRAVENOUS | Status: AC
  Filled 2015-05-28: qty 5

## 2015-05-28 MED ORDER — LIDOCAINE HCL 4 % MT SOLN
OROMUCOSAL | Status: DC | PRN
  Administered 2015-05-28: 4 mL via TOPICAL

## 2015-05-28 MED ORDER — SODIUM CHLORIDE 0.9 % IV SOLN: INTRAVENOUS | Status: DC

## 2015-05-28 MED ORDER — ONDANSETRON HCL 4 MG/2ML IJ SOLN
4.0000 mg | Freq: Four times a day (QID) | INTRAMUSCULAR | Status: DC | PRN
  Administered 2015-05-29: 4 mg via INTRAVENOUS
  Filled 2015-05-28: qty 2

## 2015-05-28 MED ORDER — SODIUM CHLORIDE 0.9 % IR SOLN
Status: DC | PRN
  Administered 2015-05-28: 1000 mL

## 2015-05-28 MED ORDER — LIDOCAINE HCL (CARDIAC) 20 MG/ML IV SOLN
INTRAVENOUS | Status: DC | PRN
  Administered 2015-05-28: 80 mg via INTRAVENOUS

## 2015-05-28 MED ORDER — ACETAMINOPHEN 650 MG RE SUPP: 650.0000 mg | Freq: Four times a day (QID) | RECTAL | Status: DC | PRN

## 2015-05-28 MED ORDER — ACETAMINOPHEN 325 MG PO TABS: 650.0000 mg | ORAL_TABLET | Freq: Two times a day (BID) | ORAL | Status: DC | PRN

## 2015-05-28 MED ORDER — SUCCINYLCHOLINE CHLORIDE 20 MG/ML IJ SOLN
INTRAMUSCULAR | Status: DC | PRN
  Administered 2015-05-28: 100 mg via INTRAVENOUS

## 2015-05-28 MED ORDER — MIDAZOLAM HCL 2 MG/2ML IJ SOLN
INTRAMUSCULAR | Status: AC
  Administered 2015-05-28: 1 mg via INTRAVENOUS
  Filled 2015-05-28: qty 2

## 2015-05-28 MED ORDER — HYDROMORPHONE HCL 1 MG/ML IJ SOLN
1.0000 mg | INTRAMUSCULAR | Status: DC | PRN
Start: 2015-05-28 — End: 2015-05-29
  Administered 2015-05-28 – 2015-05-29 (×4): 2 mg via INTRAVENOUS
  Filled 2015-05-28 (×4): qty 2

## 2015-05-28 MED ORDER — FENTANYL CITRATE (PF) 100 MCG/2ML IJ SOLN
INTRAMUSCULAR | Status: AC
  Administered 2015-05-28: 50 ug via INTRAVENOUS
  Filled 2015-05-28: qty 2

## 2015-05-28 MED ORDER — HYDROCODONE-ACETAMINOPHEN 10-325 MG PO TABS
1.0000 | ORAL_TABLET | Freq: Four times a day (QID) | ORAL | Status: DC | PRN
  Administered 2015-05-28 – 2015-05-29 (×2): 1 via ORAL
  Filled 2015-05-28 (×2): qty 1

## 2015-05-28 MED ORDER — LOSARTAN POTASSIUM 50 MG PO TABS: 50.0000 mg | ORAL_TABLET | Freq: Every day | ORAL | Status: DC

## 2015-05-28 MED ORDER — TRIAMCINOLONE ACETONIDE 40 MG/ML IJ SUSP
INTRAMUSCULAR | Status: DC | PRN
  Administered 2015-05-28: 40 mg

## 2015-05-28 MED ORDER — PANTOPRAZOLE SODIUM 40 MG PO TBEC: 40.0000 mg | DELAYED_RELEASE_TABLET | Freq: Every day | ORAL | Status: DC

## 2015-05-28 MED ORDER — METOCLOPRAMIDE HCL 5 MG PO TABS: 5.0000 mg | ORAL_TABLET | Freq: Three times a day (TID) | ORAL | Status: DC | PRN

## 2015-05-28 MED ORDER — METHOCARBAMOL 750 MG PO TABS: 750.0000 mg | ORAL_TABLET | ORAL | Status: DC | PRN

## 2015-05-28 MED ORDER — BISACODYL 10 MG RE SUPP: 10.0000 mg | Freq: Every day | RECTAL | Status: DC | PRN

## 2015-05-28 MED ORDER — PROPOFOL 10 MG/ML IV BOLUS
INTRAVENOUS | Status: AC
  Filled 2015-05-28: qty 20

## 2015-05-28 MED ORDER — HYDROMORPHONE HCL 2 MG PO TABS
2.0000 mg | ORAL_TABLET | ORAL | Status: DC | PRN
  Administered 2015-05-29: 2 mg via ORAL
  Filled 2015-05-28: qty 1

## 2015-05-28 MED ORDER — PRAVASTATIN SODIUM 20 MG PO TABS: 20.0000 mg | ORAL_TABLET | Freq: Every day | ORAL | Status: DC

## 2015-05-28 MED ORDER — ROCURONIUM BROMIDE 50 MG/5ML IV SOLN
INTRAVENOUS | Status: AC
  Filled 2015-05-28: qty 1

## 2015-05-28 MED ORDER — BUPIVACAINE HCL (PF) 0.5 % IJ SOLN
INTRAMUSCULAR | Status: DC | PRN
  Administered 2015-05-28: 25 mL via PERINEURAL

## 2015-05-28 MED ORDER — TRIAMCINOLONE ACETONIDE 40 MG/ML IJ SUSP
INTRAMUSCULAR | Status: AC
  Filled 2015-05-28: qty 5

## 2015-05-28 MED ORDER — ONDANSETRON HCL 4 MG/2ML IJ SOLN
INTRAMUSCULAR | Status: AC
  Filled 2015-05-28: qty 2

## 2015-05-28 MED ORDER — MIDAZOLAM HCL 2 MG/2ML IJ SOLN
INTRAMUSCULAR | Status: AC
  Filled 2015-05-28: qty 2

## 2015-05-28 MED ORDER — FENTANYL CITRATE (PF) 250 MCG/5ML IJ SOLN
INTRAMUSCULAR | Status: AC
  Filled 2015-05-28: qty 5

## 2015-05-28 MED ORDER — KETOROLAC TROMETHAMINE 30 MG/ML IJ SOLN: 30.0000 mg | Freq: Once | INTRAMUSCULAR | Status: DC

## 2015-05-28 MED ORDER — METOCLOPRAMIDE HCL 5 MG/ML IJ SOLN: 5.0000 mg | Freq: Three times a day (TID) | INTRAMUSCULAR | Status: DC | PRN

## 2015-05-28 MED ORDER — PHENYLEPHRINE 40 MCG/ML (10ML) SYRINGE FOR IV PUSH (FOR BLOOD PRESSURE SUPPORT)
PREFILLED_SYRINGE | INTRAVENOUS | Status: AC
  Filled 2015-05-28: qty 10

## 2015-05-28 MED ORDER — DIAZEPAM 5 MG PO TABS
10.0000 mg | ORAL_TABLET | Freq: Every day | ORAL | Status: DC
  Administered 2015-05-28: 10 mg via ORAL
  Filled 2015-05-28: qty 2

## 2015-05-28 MED ORDER — SENNOSIDES-DOCUSATE SODIUM 8.6-50 MG PO TABS: 1.0000 | ORAL_TABLET | Freq: Every day | ORAL | Status: DC

## 2015-05-28 MED ORDER — PHENYLEPHRINE HCL 10 MG/ML IJ SOLN
10.0000 mg | INTRAVENOUS | Status: DC | PRN
  Administered 2015-05-28: 20 ug/min via INTRAVENOUS

## 2015-05-28 MED ORDER — METHOCARBAMOL 500 MG PO TABS
500.0000 mg | ORAL_TABLET | Freq: Four times a day (QID) | ORAL | Status: DC | PRN
  Administered 2015-05-28: 500 mg via ORAL
  Filled 2015-05-28 (×2): qty 1

## 2015-05-28 MED ORDER — METHOCARBAMOL 500 MG PO TABS: 500.0000 mg | ORAL_TABLET | Freq: Three times a day (TID) | ORAL | Status: DC | PRN

## 2015-05-28 SURGICAL SUPPLY — 60 items
ANCHOR ALL- SUT RC 2 SUT Y-K (Anchor) ×2 IMPLANT
ANCHOR ALL-SUT FLEX 1.3 Y-KNOT (Anchor) ×3 IMPLANT
ANCHOR ALL-SUT RC 2 SUT Y-K (Anchor) ×1 IMPLANT
BLADE SAG 18X100X1.27 (BLADE) ×3 IMPLANT
BLADE SAW SAG 73X25 THK (BLADE) ×2
BLADE SAW SGTL 73X25 THK (BLADE) ×1 IMPLANT
BOWL SMART MIX CTS (DISPOSABLE) ×3 IMPLANT
CAPT SHLDR PARTIAL 1 ×3 IMPLANT
CLOSURE STERI-STRIP 1/2X4 (GAUZE/BANDAGES/DRESSINGS) ×1
CLOSURE WOUND 1/2 X4 (GAUZE/BANDAGES/DRESSINGS) ×1
CLSR STERI-STRIP ANTIMIC 1/2X4 (GAUZE/BANDAGES/DRESSINGS) ×2 IMPLANT
COVER SURGICAL LIGHT HANDLE (MISCELLANEOUS) ×3 IMPLANT
DRAPE IMP U-DRAPE 54X76 (DRAPES) ×3 IMPLANT
DRAPE INCISE IOBAN 66X45 STRL (DRAPES) ×6 IMPLANT
DRAPE U-SHAPE 47X51 STRL (DRAPES) ×3 IMPLANT
DRILL BIT 5/64 (BIT) ×3 IMPLANT
DRSG ADAPTIC 3X8 NADH LF (GAUZE/BANDAGES/DRESSINGS) ×3 IMPLANT
DRSG PAD ABDOMINAL 8X10 ST (GAUZE/BANDAGES/DRESSINGS) ×3 IMPLANT
DURAPREP 26ML APPLICATOR (WOUND CARE) ×3 IMPLANT
ELECT NEEDLE TIP 2.8 STRL (NEEDLE) ×3 IMPLANT
ELECT REM PT RETURN 9FT ADLT (ELECTROSURGICAL) ×3
ELECTRODE REM PT RTRN 9FT ADLT (ELECTROSURGICAL) ×1 IMPLANT
GAUZE SPONGE 4X4 12PLY STRL (GAUZE/BANDAGES/DRESSINGS) ×3 IMPLANT
GLOVE BIOGEL PI ORTHO PRO 7.5 (GLOVE) ×2
GLOVE BIOGEL PI ORTHO PRO SZ8 (GLOVE) ×2
GLOVE ORTHO TXT STRL SZ7.5 (GLOVE) ×3 IMPLANT
GLOVE PI ORTHO PRO STRL 7.5 (GLOVE) ×1 IMPLANT
GLOVE PI ORTHO PRO STRL SZ8 (GLOVE) ×1 IMPLANT
GLOVE SURG ORTHO 8.5 STRL (GLOVE) ×3 IMPLANT
GOWN STRL REUS W/ TWL XL LVL3 (GOWN DISPOSABLE) ×2 IMPLANT
GOWN STRL REUS W/TWL XL LVL3 (GOWN DISPOSABLE) ×4
KIT BASIN OR (CUSTOM PROCEDURE TRAY) ×3 IMPLANT
KIT ROOM TURNOVER OR (KITS) ×3 IMPLANT
MANIFOLD NEPTUNE II (INSTRUMENTS) ×3 IMPLANT
NDL SUT .5 MAYO 1.404X.05X (NEEDLE) ×1 IMPLANT
NEEDLE HYPO 25GX1X1/2 BEV (NEEDLE) ×3 IMPLANT
NEEDLE MAYO TAPER (NEEDLE) ×2
NS IRRIG 1000ML POUR BTL (IV SOLUTION) ×3 IMPLANT
PACK SHOULDER (CUSTOM PROCEDURE TRAY) ×3 IMPLANT
PACK UNIVERSAL I (CUSTOM PROCEDURE TRAY) ×3 IMPLANT
PAD ARMBOARD 7.5X6 YLW CONV (MISCELLANEOUS) ×6 IMPLANT
SLING ARM IMMOBILIZER LRG (SOFTGOODS) ×3 IMPLANT
SLING ARM IMMOBILIZER MED (SOFTGOODS) IMPLANT
SPONGE GAUZE 4X4 12PLY STER LF (GAUZE/BANDAGES/DRESSINGS) ×3 IMPLANT
SPONGE LAP 18X18 X RAY DECT (DISPOSABLE) ×3 IMPLANT
STRIP CLOSURE SKIN 1/2X4 (GAUZE/BANDAGES/DRESSINGS) ×2 IMPLANT
SUCTION FRAZIER TIP 10 FR DISP (SUCTIONS) ×3 IMPLANT
SUT FIBERWIRE #2 38 T-5 BLUE (SUTURE) ×12
SUT MNCRL AB 4-0 PS2 18 (SUTURE) ×3 IMPLANT
SUT VIC AB 0 CT1 27 (SUTURE) ×2
SUT VIC AB 0 CT1 27XBRD ANBCTR (SUTURE) ×1 IMPLANT
SUT VIC AB 2-0 CT1 27 (SUTURE) ×2
SUT VIC AB 2-0 CT1 TAPERPNT 27 (SUTURE) ×1 IMPLANT
SUTURE FIBERWR #2 38 T-5 BLUE (SUTURE) ×4 IMPLANT
SYR CONTROL 10ML LL (SYRINGE) ×3 IMPLANT
TAPE CLOTH SURG 6X10 WHT LF (GAUZE/BANDAGES/DRESSINGS) ×3 IMPLANT
TOWEL OR 17X24 6PK STRL BLUE (TOWEL DISPOSABLE) ×3 IMPLANT
TOWEL OR 17X26 10 PK STRL BLUE (TOWEL DISPOSABLE) ×3 IMPLANT
TRAY FOLEY CATH 16FRSI W/METER (SET/KITS/TRAYS/PACK) IMPLANT
WATER STERILE IRR 1000ML POUR (IV SOLUTION) ×3 IMPLANT

## 2015-05-28 NOTE — Interval H&P Note (Signed)
History and Physical Interval Note:  05/28/2015 12:28 PM  Charles Grimes  has presented today for surgery, with the diagnosis of RIGHT SHOULDER PAIN ROTATOR CUFF INSUFFICEY AND SUBSCAPULAR TEAR   The various methods of treatment have been discussed with the patient and family. After consideration of risks, benefits and other options for treatment, the patient has consented to  Procedure(s): RIGHT SHOULDER HEMI-ARTHROPLASTY CTA [place]  (Right) as a surgical intervention .  The patient's history has been reviewed, patient examined, no change in status, stable for surgery.  I have reviewed the patient's chart and labs.  Questions were answered to the patient's satisfaction.     Katti Pelle,STEVEN R

## 2015-05-28 NOTE — Interval H&P Note (Deleted)
History and Physical Interval Note:  05/28/2015 12:41 PM  Charles Grimes  has presented today for surgery, with the diagnosis of RIGHT SHOULDER PAIN ROTATOR CUFF INSUFFICEY AND SUBSCAPULAR TEAR   The various methods of treatment have been discussed with the patient and family. After consideration of risks, benefits and other options for treatment, the patient has consented to  Procedure(s): RIGHT SHOULDER HEMI-ARTHROPLASTY CTA [place]  (Right) as a surgical intervention .  The patient's history has been reviewed, patient examined, no change in status, stable for surgery.  I have reviewed the patient's chart and labs.  Questions were answered to the patient's satisfaction.     [name],STEVEN R

## 2015-05-28 NOTE — Discharge Instructions (Signed)
Ice to the right shoulder as much as possible.  Keep the arm propped so that the arm is across your abdomen.  Use the sling while you are up walking around and outside the home.  IN the home it is ok to remove the sling and hug a pillow. No heavy use of the right shoulder or arm.  Keep the incision clean and dry and covered for one week, then ok to shower and get wound wet.  Do exercises every hour while awake.  Follow up in the office in two weeks  [phone]

## 2015-05-28 NOTE — Transfer of Care (Signed)
Immediate Anesthesia Transfer of Care Note  Patient: Charles Grimes  Procedure(s) Performed: Procedure(s): RIGHT SHOULDER HEMI-ARTHROPLASTY CTA HEAD AND SUBSCAP REPAIR  (Right)  Patient Location: PACU  Anesthesia Type:General  Level of Consciousness: awake, alert  and oriented  Airway & Oxygen Therapy: Patient connected to face mask oxygen  Post-op Assessment: Report given to RN  Post vital signs: stable  Last Vitals:  Filed Vitals:   05/28/15 1235  BP: 112/62  Pulse: 67  Temp:   Resp:     Complications: No apparent anesthesia complications

## 2015-05-28 NOTE — Brief Op Note (Signed)
05/28/2015  3:06 PM  PATIENT:  Charles Grimes  59 y.o. male  PRE-OPERATIVE DIAGNOSIS:  1) RIGHT SHOULDER PAIN ROTATOR CUFF INSUFFICEY AND SUBSCAPULAR TEAR 2) LEFT SHOULDER PAIN  POST-OPERATIVE DIAGNOSIS:  same  PROCEDURE:  Procedure(s): RIGHT SHOULDER HEMI-ARTHROPLASTY CTA HEAD AND SUBSCAP REPAIR  (Right) LEFT SHOULDER SUBACROMIAL CORTISONE INJECTION  SURGEON:  Surgeon(s) and Role:    * Netta Cedars, MD - Primary  PHYSICIAN ASSISTANT:   ASSISTANTS: Ventura Bruns, PA-C   ANESTHESIA:   regional and general  EBL:  Total I/O In: 1000 [I.V.:1000] Out: -   BLOOD ADMINISTERED:none  DRAINS: none   LOCAL MEDICATIONS USED:  MARCAINE     SPECIMEN:  No Specimen  DISPOSITION OF SPECIMEN:  N/A  COUNTS:  YES  TOURNIQUET:  * No tourniquets in log *  DICTATION: .Other Dictation: Dictation Number [id number]  PLAN OF CARE: Admit to inpatient   PATIENT DISPOSITION:  PACU - hemodynamically stable.   Delay start of Pharmacological VTE agent (>24hrs) due to surgical blood loss or risk of bleeding: not applicable

## 2015-05-28 NOTE — Anesthesia Procedure Notes (Addendum)
Anesthesia Regional Block:  Interscalene brachial plexus block  Pre-Anesthetic Checklist: ,, timeout performed, Correct Patient, Correct Site, Correct Laterality, Correct Procedure, Correct Position, site marked, Risks and benefits discussed,  Surgical consent,  Pre-op evaluation,  At surgeon's request and post-op pain management  Laterality: Right  Prep: chloraprep       Needles:  Injection technique: Single-shot  Needle Type: Echogenic Needle     Needle Length: 9cm 9 cm Needle Gauge: 21 and 21 G    Additional Needles:  Procedures: ultrasound guided (picture in chart) Interscalene brachial plexus block Narrative:  Injection made incrementally with aspirations every 5 mL.  Performed by: Personally  Anesthesiologist: ROSE, Iona Beard  Additional Notes: Patient tolerated the procedure well without complications   Procedure Name: Intubation Date/Time: 05/28/2015 12:59 PM Performed by: Lavell Luster Pre-anesthesia Checklist: Patient identified, Timeout performed, Emergency Drugs available, Suction available and Patient being monitored Patient Re-evaluated:Patient Re-evaluated prior to inductionOxygen Delivery Method: Circle system utilized Preoxygenation: Pre-oxygenation with 100% oxygen Intubation Type: IV induction Ventilation: Mask ventilation without difficulty Laryngoscope Size: Mac and 4 Grade View: Grade I Tube type: Oral Tube size: 7.5 mm Number of attempts: 1 Airway Equipment and Method: Stylet Placement Confirmation: ETT inserted through vocal cords under direct vision,  breath sounds checked- equal and bilateral and positive ETCO2 Secured at: 22 cm Tube secured with: Tape Dental Injury: Teeth and Oropharynx as per pre-operative assessment

## 2015-05-28 NOTE — Anesthesia Preprocedure Evaluation (Addendum)
Anesthesia Evaluation  Patient identified by MRN, date of birth, ID band Patient awake    Reviewed: Allergy & Precautions, NPO status , Patient's Chart, lab work & pertinent test results  Airway Mallampati: II  TM Distance: >3 FB Neck ROM: Full    Dental no notable dental hx. (+) Teeth Intact, Dental Advisory Given   Pulmonary neg pulmonary ROS, shortness of breath, Current Smoker,    Pulmonary exam normal breath sounds clear to auscultation       Cardiovascular hypertension, + angina with exertion + CAD, + Peripheral Vascular Disease and +CHF  Normal cardiovascular exam Rhythm:Regular Rate:Normal  Nonischemic cardiomyopathy ([place]) 05/2013 Non-ischemic: EF ~45% by Echo & Myoview --> Non-obstructive CAD     Neuro/Psych Anxiety Depression negative neurological ROS  negative psych ROS   GI/Hepatic negative GI ROS, Neg liver ROS, GERD  Medicated and Controlled,  Endo/Other  negative endocrine ROS  Renal/GU negative Renal ROS  negative genitourinary   Musculoskeletal negative musculoskeletal ROS (+)   Abdominal (+)  Abdomen: soft. Bowel sounds: normal.  Peds negative pediatric ROS (+)  Hematology negative hematology ROS (+)   Anesthesia Other Findings   Reproductive/Obstetrics negative OB ROS                           Anesthesia Physical Anesthesia Plan  ASA: III  Anesthesia Plan: General   Post-op Pain Management: GA combined w/ Regional for post-op pain   Induction: Intravenous  Airway Management Planned: Oral ETT  Additional Equipment:   Intra-op Plan:   Post-operative Plan: Extubation in OR  Informed Consent: I have reviewed the patients History and Physical, chart, labs and discussed the procedure including the risks, benefits and alternatives for the proposed anesthesia with the patient or authorized representative who has indicated his/her understanding and acceptance.    Dental advisory given  Plan Discussed with: CRNA and Surgeon  Anesthesia Plan Comments:         Anesthesia Quick Evaluation

## 2015-05-28 NOTE — Interval H&P Note (Signed)
History and Physical Interval Note:  05/28/2015 12:42 PM  Charles Grimes  has presented today for surgery, with the diagnosis of RIGHT SHOULDER PAIN ROTATOR CUFF INSUFFICEY AND SUBSCAPULAR TEAR   The various methods of treatment have been discussed with the patient and family. After consideration of risks, benefits and other options for treatment, the patient has consented to  Procedure(s): RIGHT SHOULDER HEMI-ARTHROPLASTY CTA [place]  (Right) as a surgical intervention . I also discussed with the patient and his family that if the subscapularis is not repairable then the CTA hemi-arthroplasty will not be an option and we will need to proceed with a Reverse TSA.  He understands and agrees to this plan.  The patient also requested that we inject his painful left shoulder while he is asleep. This was added to the consent for surgery.  The patient's history has been reviewed, patient examined, no change in status, stable for surgery.  I have reviewed the patient's chart and labs.  Questions were answered to the patient's satisfaction.     Annette Bertelson,STEVEN R

## 2015-05-28 NOTE — Anesthesia Postprocedure Evaluation (Signed)
  Anesthesia Post-op Note  Patient: Charles Grimes  Procedure(s) Performed: Procedure(s): RIGHT SHOULDER HEMI-ARTHROPLASTY CTA HEAD AND SUBSCAP REPAIR  (Right)  Patient Location: PACU  Anesthesia Type: General   Level of Consciousness: awake, alert  and oriented  Airway and Oxygen Therapy: Patient Spontanous Breathing  Post-op Pain: mild  Post-op Assessment: Post-op Vital signs reviewed  Post-op Vital Signs: Reviewed  Last Vitals:  Filed Vitals:   05/28/15 1501  BP: 125/84  Pulse: 96  Temp: 36.8 C  Resp: 14    Complications: No apparent anesthesia complications

## 2015-05-29 LAB — BASIC METABOLIC PANEL
Anion gap: 6 (ref 5–15)
BUN: 18 mg/dL (ref 6–20)
CO2: 26 mmol/L (ref 22–32)
CREATININE: 0.85 mg/dL (ref 0.61–1.24)
Calcium: 8.3 mg/dL — ABNORMAL LOW (ref 8.9–10.3)
Chloride: 103 mmol/L (ref 101–111)
GFR calc Af Amer: >60 mL/min (ref >60)
GLUCOSE: 117 mg/dL — AB (ref 65–99)
POTASSIUM: 4.4 mmol/L (ref 3.5–5.1)
SODIUM: 135 mmol/L (ref 135–145)

## 2015-05-29 LAB — HEMOGLOBIN AND HEMATOCRIT, BLOOD
HEMATOCRIT: 40.4 % (ref 39.0–52.0)
HEMOGLOBIN: 12.9 g/dL — AB (ref 13.0–17.0)

## 2015-05-29 NOTE — Evaluation (Signed)
Occupational Therapy Evaluation Patient Details Name: COGAN VANZANT MRN: [id number] DOB: [birth year] Today's Date: 05/29/2015    History of Present Illness LEONE CHECCHI is an 59 y.o. male who was admitted 05/28/2015, pt is s/p right shoulder replacement.    Clinical Impression   Patient evaluated by OT. Patient plans to d/c home with wife and sisters assistance. No further acute OT needs at this time. Recommending OPOT once MD clears and finds appropriate. D/C shoulder handout given and reviewed with patient and patient's wife. Please see exercises and instructions completed and educated on below. Patient aware of 0-90* FF, 0-60* abduction, and 0-30* ER restrictions, AND NWB. Patient independently able to direct caregivers prn for assistance with ADLs.     Follow Up Recommendations  No OT follow up;Supervision - Intermittent    Equipment Recommendations  None recommended by OT    Recommendations for Other Services  None at this time    Precautions / Restrictions Precautions Precautions: Shoulder Type of Shoulder Precautions: FF 0-90*, ER 0-30*, Abduction 0-60*. AROM to elbow, wrist, fingers OK Shoulder Interventions: Shoulder sling/immobilizer;For comfort (and during sleep) Restrictions Weight Bearing Restrictions: Yes RUE Weight Bearing: Non weight bearing    Mobility Bed Mobility Overal bed mobility: Modified Independent  Transfers Overall transfer level: Modified independent Equipment used: None    Balance Overall balance assessment: No apparent balance deficits (not formally assessed)    ADL Overall ADL's : Needs assistance/impaired Eating/Feeding: Set up;Sitting   Grooming: Set up;Sitting   Upper Body Bathing: Minimal assitance;Sitting   Lower Body Bathing: Minimal assistance;Sit to/from stand   Upper Body Dressing : Minimal assistance;Sitting   Lower Body Dressing: Minimal assistance;Sit to/from stand   Toilet Transfer: Supervision/safety Functional  mobility during ADLs: Supervision/safety General ADL Comments: Pt's wife present and able to assist prn.      Pertinent Vitals/Pain Pain Assessment: Faces Faces Pain Scale: Hurts even more Pain Location: right shoulder with movement, none at rest and when supported  Pain Descriptors / Indicators: Aching;Dull;Discomfort Pain Intervention(s): Limited activity within patient's tolerance;Monitored during session;Repositioned;Ice applied     Hand Dominance Right   Extremity/Trunk Assessment Upper Extremity Assessment Upper Extremity Assessment: RUE deficits/detail RUE Deficits / Details: recent surgery which limits mobilization and strength    Lower Extremity Assessment Lower Extremity Assessment: Overall WFL for tasks assessed   Cervical / Trunk Assessment Cervical / Trunk Assessment: Normal   Communication Communication Communication: No difficulties   Cognition Arousal/Alertness: Awake/alert Behavior During Therapy: WFL for tasks assessed/performed Overall Cognitive Status: Within Functional Limits for tasks assessed         Shoulder Instructions Shoulder Instructions Donning/doffing shirt without moving shoulder: Minimal assistance;Caregiver independent with task;Patient able to independently direct caregiver Method for sponge bathing under operated UE: Minimal assistance;Patient able to independently direct caregiver;Caregiver independent with task Donning/doffing sling/immobilizer: Minimal assistance;Patient able to independently direct caregiver;Caregiver independent with task Correct positioning of sling/immobilizer: Supervision/safety;Caregiver independent with task Pendulum exercises (written home exercise program):  (n/a) ROM for elbow, wrist and digits of operated UE: Supervision/safety;Caregiver independent with task Sling wearing schedule (on at all times/off for ADL's): Independent Proper positioning of operated UE when showering: Supervision/safety;Caregiver  independent with task Positioning of UE while sleeping: Caregiver independent with task;Supervision/safety    Home Living Family/patient expects to be discharged to:: Private residence Living Arrangements: Spouse/significant other Available Help at Discharge: Family;Available 24 hours/day (wife and sister) Type of Home: Mobile home Home Access: Stairs to enter Entrance Stairs-Number of Steps: 3 Entrance Stairs-Rails: Right;Left Home Layout:  One level     Bathroom Shower/Tub: Teacher, early years/pre: Standard Additional Comments: wife reports they have access to a BSC if needed      Prior Functioning/Environment Level of Independence: Independent      OT Diagnosis: Generalized weakness;Acute pain   OT Problem List:   n/a, no acute OT needs identified   OT Treatment/Interventions:   n/a, no acute OT needs identified    OT Goals(Current goals can be found in the care plan section) Acute Rehab OT Goals Patient Stated Goal: go home today! OT Goal Formulation: All assessment and education complete, DC therapy  OT Frequency:  n/a, no acute OT needs identified    Barriers to D/C:  None known at this time    End of Session Equipment Utilized During Treatment: Other (comment) (sling) Nurse Communication: Other (comment) (ready to d/c from OT standpoint)  Activity Tolerance: Patient tolerated treatment well Patient left: in chair;with call bell/phone within reach;with family/visitor present   Time: 0928-1000 OT Time Calculation (min): 32 min Charges:  OT General Charges $OT Visit: 1 Procedure OT Evaluation $Initial OT Evaluation Tier I: 1 Procedure OT Treatments $Self Care/Home Management : 8-22 mins   Markiesha Delia , MS, OTR/L, CLT Pager: [phone]  05/29/2015, 10:23 AM

## 2015-05-29 NOTE — Discharge Summary (Signed)
Physician Discharge Summary   Patient ID: Charles Grimes MRN: [id number] DOB/AGE: 20-Oct-1955 59 y.o.  Admit date: 05/28/2015 Discharge date: 05/29/2015  Admission Diagnoses:  Active Problems:   S/P shoulder replacement   Discharge Diagnoses:  Same   Surgeries: Procedure(s): RIGHT SHOULDER HEMI-ARTHROPLASTY CTA HEAD AND SUBSCAP REPAIR  on 05/28/2015   Consultants: PT/OT  Discharged Condition: Stable  Hospital Course: Charles Grimes is an 59 y.o. male who was admitted 05/28/2015 with a chief complaint of No chief complaint on file. , and found to have a diagnosis of <principal problem not specified>.  They were brought to the operating room on 05/28/2015 and underwent the above named procedures.    The patient had an uncomplicated hospital course and was stable for discharge.  Recent vital signs:  Filed Vitals:   05/29/15 0024  BP: 104/70  Pulse: 80  Temp: 98.6 F (37 C)  Resp: 16    Recent laboratory studies:  Results for orders placed or performed during the hospital encounter of 05/28/15  Hemoglobin and hematocrit, blood  Result Value Ref Range   Hemoglobin 12.9 (L) 13.0 - 17.0 g/dL   HCT 40.4 39.0 - 52.0 %    Discharge Medications:     Medication List    TAKE these medications        acetaminophen 325 MG tablet  Commonly known as:  TYLENOL  Take 650 mg by mouth 2 (two) times daily as needed for mild pain.     ASPERCREME W/LIDOCAINE 4 % cream  Generic drug:  lidocaine  Apply 1 application topically 2 (two) times daily as needed (pain).     aspirin EC 81 MG tablet  Take 81 mg by mouth daily.     diazepam 10 MG tablet  Commonly known as:  VALIUM  Take 1 tablet (10 mg total) by mouth at bedtime.     HYDROcodone-acetaminophen 10-325 MG tablet  Commonly known as:  NORCO  Take 1 tablet by mouth every 6 (six) hours as needed (pain).     HYDROmorphone 2 MG tablet  Commonly known as:  DILAUDID  Take 1 tablet (2 mg total) by mouth every 4 (four) hours  as needed for severe pain.     losartan 50 MG tablet  Commonly known as:  COZAAR  Take 1 tablet (50 mg total) by mouth daily.     methocarbamol 750 MG tablet  Commonly known as:  ROBAXIN  Take 1 tablet by mouth every 4 (four) hours as needed for muscle spasms.     methocarbamol 500 MG tablet  Commonly known as:  ROBAXIN  Take 1 tablet (500 mg total) by mouth 3 (three) times daily as needed.     nitroGLYCERIN 0.4 MG SL tablet  Commonly known as:  NITROSTAT  Place 1 tablet (0.4 mg total) under the tongue every 5 (five) minutes as needed for chest pain.     omeprazole 20 MG tablet  Commonly known as:  PRILOSEC OTC  Take 20 mg by mouth daily.     pravastatin 20 MG tablet  Commonly known as:  PRAVACHOL  TAKE 1 TABLET (20 MG TOTAL) BY MOUTH DAILY.     senna-docusate 8.6-50 MG tablet  Commonly known as:  Senokot-S  Take 1 tablet by mouth daily.     sertraline 100 MG tablet  Commonly known as:  ZOLOFT  Take 1 tablet (100 mg total) by mouth daily.        Diagnostic Studies: Dg Shoulder Right Port  05/28/2015  CLINICAL DATA:  Right shoulder replacement post op EXAM: PORTABLE RIGHT SHOULDER - 2+ VIEW COMPARISON:  None FINDINGS: Status post right shoulder arthroplasty. No evidence for dislocation on the frontal view provided. There is incidental note of right lower lobe lung base atelectasis. IMPRESSION: Status post right shoulder arthroplasty. Electronically Signed   By: Nolon Nations M.D.   On: 05/28/2015 16:28    Disposition: 01-Home or Self Care        Follow-up Information    Follow up with NORRIS,STEVEN R, MD. Call in 2 weeks.   Specialty:  Orthopedic Surgery   Why:  [phone]   Contact information:   [address] [place] [address] [id number]        Signed: Ventura Bruns 05/29/2015, 7:45 AM

## 2015-05-29 NOTE — Progress Notes (Signed)
   Subjective: 1 Day Post-Op Procedure(s) (LRB): RIGHT SHOULDER HEMI-ARTHROPLASTY CTA HEAD AND SUBSCAP REPAIR  (Right)  Pt c/o mild soreness but overall doing well Ready for d/c home Denies any new symptoms or issues Patient reports pain as mild.  Objective:   VITALS:   Filed Vitals:   05/29/15 0024  BP: 104/70  Pulse: 80  Temp: 98.6 F (37 C)  Resp: 16    Right shoulder dressing and sling in place nv intact distally No rashes or edema  LABS  Recent Labs  05/29/15 0620  HGB 12.9*  HCT 40.4    No results for input(s): NA, K, BUN, CREATININE, GLUCOSE in the last 72 hours.   Assessment/Plan: 1 Day Post-Op Procedure(s) (LRB): RIGHT SHOULDER HEMI-ARTHROPLASTY CTA HEAD AND SUBSCAP REPAIR  (Right) D/c home today F/u in 2 weeks Pain control as needed    Merla Riches, MPAS, PA-C  05/29/2015, 7:44 AM

## 2015-05-29 NOTE — Op Note (Signed)
Charles Grimes, QUIST NO.:  [id number]  MEDICAL RECORD NO.:  [id number]  LOCATION:  5N10C                        FACILITY:  [facility]  PHYSICIAN:  Doran [name], M.D. DATE OF BIRTH:  25-Sep-1955  DATE OF PROCEDURE:  05/28/2015 DATE OF DISCHARGE:                              OPERATIVE REPORT   PREOPERATIVE DIAGNOSES: 1. Right shoulder rotator cuff insufficiency and pain. 2. Left shoulder pain. 3. Right shoulder subscapularis tear.  POSTOPERATIVE DIAGNOSES: 1. Right shoulder rotator cuff insufficiency and pain. 2. Left shoulder pain. 3. Right shoulder subscapularis tear.  PROCEDURE PERFORMED: 1. Right shoulder CTA head hemiarthroplasty with subscapularis repair. 2. Left shoulder subacromial cortisone injection.  ATTENDING SURGEON:  Doran [name], M.D.  ASSISTANT:  Abbott Pao. Dixon, PA-C, who scrubbed in the entire procedure and necessary for satisfactory completion of surgery.  ANESTHESIA:  General anesthesia was used plus interscalene block.  ESTIMATED BLOOD LOSS:  Minimal.  FLUID REPLACEMENT:  1200 mL of crystalloids.  INSTRUMENT COUNTS:  Correct.  COMPLICATIONS:  There were no complications.  ANTIBIOTICS:  Perioperative antibiotics were given.  INDICATIONS:  The patient is a 59 year old male with worsening right shoulder pain secondary to rotator cuff insufficiency and a recent subscap tear.  The patient has an intact teres minor, but torn and retracted supraspinatus and infraspinatus with atrophy.  The patient has had recent change in his shoulder with an injury and profound loss of function, suspicious for a new subscap tear.  The patient attempted modification of activity, pain medications, anti-inflammatories, and had ongoing shoulder pain, not amenable to conservative management, thus we discussed the option of CTA head arthroplasty with a subscapularis repair in an attempt to give him some function back in his shoulder and hopefully  minimize his pain.  He has also had increasing left shoulder pain recently and asked to have a subacromial cortisone injection in the left shoulder while he was asleep for the right shoulder surgery.  Risks and benefits of surgery were discussed.  Informed consent obtained.  DESCRIPTION OF PROCEDURE:  After an adequate level of anesthesia was achieved, the patient was positioned in the modified beach-chair position.  Right shoulder correctly identified, sterilely prepped and draped in usual manner.  Time-out called.  We entered the shoulder using standard deltopectoral approach starting at coracoid process and extending down to the anterior humerus.  Dissection down through subcutaneous tissues, identified the cephalic vein, took laterally the deltoid, pectoralis taken medially.  We then identified the coracoid process and retracted the conjoined tendon medially.  We then identified the subscapularis tendon, which was tearing away from the upper portion of the lesser tuberosity, this was not a complete tear fortunately.  We were able to release the remaining subscap, place #2 FiberWire sutures in a modified Mason-Allen suture technique in the free end of the tendon for repair of the subscapularis tendon at the end of surgery.  We then progressively externally rotated, releasing the capsule off the inferior humerus.  We then were able to put our retractors in place due to our head resection.  We placed our neck resection guide, head resection guide anterior to the humerus, externally rotated the patient with the elbow at  the side in about 20 degrees.  We then resected using an oscillating saw the humeral head.  We then went ahead and used sequential hand reamers up to a size 16 for humeral canal diameter and then did our box cut proximally with the size 16 cutter, set on 20 degrees of retroversion, which was the neck cut.  We then went ahead and broached up to a size 16 [employer]  Stem and then trialed off.  We then we went ahead and did our additional CTA head resection.  The supraspinatus and infraspinatus tendons were completely absent and the teres minor was intact.  We made our resection of the extra bone using resection guide and at this point we placed our 52 x 23 CTA head trial in place and impacted that in position.  This reduced our shoulder and we were happy with the soft tissue balance and coverage.  We removed the trial implants, irrigated the canal thoroughly, drilled holes through the lesser tuberosity for placement of #2 FiberWire sutures for repair of the subscapularis.  We then used impaction grafting technique with available bone graft from the humeral head and impacted the size 16 [employer] Stem into place with 20 degrees of retroversion.  We then placed the 52 x 23 CTA head in place and impacted that.  We were pleased with the coverage and then went ahead and reduced the shoulder and then anatomically repaired the subscapularis tendon.  We had about 30 degrees of external rotation of excursion after we repaired.  We made sure that was nice and free.  We checked our axillary nerve, it was still intact and in place, and then closed the deltopectoral with 0- Vicryl suture followed by 2-0 Vicryl subcutaneous closure, 4-0 Monocryl for skin.  Steri-Strips and sterile bandage were applied.  The patient tolerated the surgery well.     Doran [name], M.D.     SRN/MEDQ  D:  05/28/2015  T:  05/29/2015  Job:  [id number]

## 2015-05-31 ENCOUNTER — Encounter (HOSPITAL_COMMUNITY): Payer: Self-pay | Admitting: Orthopedic Surgery

## 2015-06-14 ENCOUNTER — Other Ambulatory Visit: Payer: Self-pay | Admitting: Physician Assistant

## 2015-06-20 ENCOUNTER — Other Ambulatory Visit: Payer: Self-pay | Admitting: Physician Assistant

## 2015-06-20 ENCOUNTER — Encounter: Payer: Self-pay | Admitting: Physician Assistant

## 2015-06-20 DIAGNOSIS — Z1211 Encounter for screening for malignant neoplasm of colon: Secondary | ICD-10-CM

## 2015-07-14 ENCOUNTER — Ambulatory Visit: Payer: 59 | Admitting: Physician Assistant

## 2015-07-21 ENCOUNTER — Ambulatory Visit: Payer: 59 | Admitting: Physician Assistant

## 2015-08-30 ENCOUNTER — Other Ambulatory Visit: Payer: Self-pay | Admitting: *Deleted

## 2015-08-30 MED ORDER — LOSARTAN POTASSIUM 50 MG PO TABS: 50.0000 mg | ORAL_TABLET | Freq: Every day | ORAL | Status: DC

## 2015-09-07 ENCOUNTER — Telehealth: Payer: Self-pay | Admitting: Physician Assistant

## 2015-09-07 NOTE — Telephone Encounter (Signed)
LM for pt to call and schedule flu shot or update records & to schedule CPE (last 06/2014)

## 2015-09-10 ENCOUNTER — Telehealth: Payer: Self-pay | Admitting: Physician Assistant

## 2015-09-10 DIAGNOSIS — E785 Hyperlipidemia, unspecified: Secondary | ICD-10-CM

## 2015-09-10 NOTE — Telephone Encounter (Signed)
Called and [facility] @ 7:37am @ [phone]) asking the pt to RTC regarding note below.//AB/CMA

## 2015-09-10 NOTE — Telephone Encounter (Signed)
Got refill request for cholesterol medication. Patient needs lab appointment for repeat lipid panel and CMP to assess before continuing medication. Please have patient schedule

## 2015-09-14 NOTE — Telephone Encounter (Signed)
Called left detailed message to call back.

## 2015-09-16 NOTE — Telephone Encounter (Signed)
Called left detailed message to call back.

## 2015-09-20 NOTE — Telephone Encounter (Signed)
Called and spoke with the pt's wife and informed her of the note below.  She verbalized understanding.  She scheduled pt a lab appt for (Tues-09/21/15 @ 7:00am).  Future labs ordered and sent.//AB/CMA

## 2015-09-20 NOTE — Telephone Encounter (Signed)
Please try to call patient one more time. If no response, ok to send letter.

## 2015-09-21 ENCOUNTER — Other Ambulatory Visit (INDEPENDENT_AMBULATORY_CARE_PROVIDER_SITE_OTHER): Payer: Self-pay

## 2015-09-21 ENCOUNTER — Telehealth: Payer: Self-pay | Admitting: *Deleted

## 2015-09-21 DIAGNOSIS — E785 Hyperlipidemia, unspecified: Secondary | ICD-10-CM

## 2015-09-21 LAB — COMPREHENSIVE METABOLIC PANEL
ALBUMIN: 3.6 g/dL (ref 3.5–5.2)
ALT: 14 U/L (ref 0–53)
AST: 13 U/L (ref 0–37)
Alkaline Phosphatase: 88 U/L (ref 39–117)
BILIRUBIN TOTAL: 0.4 mg/dL (ref 0.2–1.2)
BUN: 24 mg/dL — ABNORMAL HIGH (ref 6–23)
CO2: 24 mEq/L (ref 19–32)
CREATININE: 0.85 mg/dL (ref 0.40–1.50)
Calcium: 8.8 mg/dL (ref 8.4–10.5)
Chloride: 109 mEq/L (ref 96–112)
GFR: 98 mL/min (ref >60.00)
GLUCOSE: 87 mg/dL (ref 70–99)
POTASSIUM: 3.8 meq/L (ref 3.5–5.1)
SODIUM: 141 meq/L (ref 135–145)
TOTAL PROTEIN: 6.7 g/dL (ref 6.0–8.3)

## 2015-09-21 LAB — LIPID PANEL
CHOLESTEROL: 189 mg/dL (ref 0–200)
HDL: 47.1 mg/dL (ref >39.00)
LDL Cholesterol: 126 mg/dL — ABNORMAL HIGH (ref 0–99)
NONHDL: 141.65
Total CHOL/HDL Ratio: 4
Triglycerides: 79 mg/dL (ref 0.0–149.0)
VLDL: 15.8 mg/dL (ref 0.0–40.0)

## 2015-09-21 NOTE — Telephone Encounter (Signed)
Called and spoke with the pt regarding med refill request.  Pt stated that he will have his wife call and talk with me regarding the request.//AB/CMA

## 2015-09-22 ENCOUNTER — Telehealth: Payer: Self-pay | Admitting: *Deleted

## 2015-09-22 MED ORDER — LOSARTAN POTASSIUM 50 MG PO TABS: 50.0000 mg | ORAL_TABLET | Freq: Every day | ORAL | Status: DC

## 2015-09-22 MED ORDER — PRAVASTATIN SODIUM 40 MG PO TABS: 40.0000 mg | ORAL_TABLET | Freq: Every day | ORAL | Status: DC

## 2015-09-22 NOTE — Telephone Encounter (Signed)
-----   Message from Brunetta Jeans, PA-C sent at 09/22/2015 11:53 AM EST ----- Labs good overall. LDL has risen since last year. Recommend increase dose of pravachol to 40 mg daily. Ok to send in new Rx. Quantity 90 with 1 refill.

## 2015-09-22 NOTE — Telephone Encounter (Signed)
Called and spoke with the pt's wife regarding pt's refill request.  Informed the wife that I noticed that cardiology refilled the pt's medication the last time, and he wanted the pt to schedule an appt.  She stated that she would like for the pt to see only one doctor

## 2015-09-22 NOTE — Telephone Encounter (Signed)
Called and spoke with the pt's wife and informed her of the pt's recent lab results and note.  She verbalized understanding and agreed to the new medication.  New prescription sent to the pharmacy by e-script.//AB/CMA

## 2015-10-22 ENCOUNTER — Encounter: Payer: Self-pay | Admitting: Behavioral Health

## 2015-10-22 ENCOUNTER — Telehealth: Payer: Self-pay | Admitting: Behavioral Health

## 2015-10-22 NOTE — Telephone Encounter (Signed)
Pt's spouse is returning your call  CB: [phone]

## 2015-10-22 NOTE — Telephone Encounter (Signed)
Per the patient, he would like for his wife to complete pre-visit info. Patient's spouse will return call on her lunch break.

## 2015-10-25 ENCOUNTER — Ambulatory Visit (INDEPENDENT_AMBULATORY_CARE_PROVIDER_SITE_OTHER): Payer: Self-pay | Admitting: Physician Assistant

## 2015-10-25 ENCOUNTER — Encounter: Payer: Self-pay | Admitting: Physician Assistant

## 2015-10-25 VITALS — BP 110/76 | HR 75 | Temp 98.2°F | Ht 71.0 in | Wt 216.2 lb

## 2015-10-25 DIAGNOSIS — I10 Essential (primary) hypertension: Secondary | ICD-10-CM

## 2015-10-25 DIAGNOSIS — Z125 Encounter for screening for malignant neoplasm of prostate: Secondary | ICD-10-CM

## 2015-10-25 DIAGNOSIS — E785 Hyperlipidemia, unspecified: Secondary | ICD-10-CM

## 2015-10-25 DIAGNOSIS — E669 Obesity, unspecified: Secondary | ICD-10-CM

## 2015-10-25 DIAGNOSIS — Z0000 Encounter for general adult medical examination without abnormal findings: Secondary | ICD-10-CM

## 2015-10-25 LAB — URINALYSIS, ROUTINE W REFLEX MICROSCOPIC
Bilirubin Urine: NEGATIVE
Ketones, ur: NEGATIVE
LEUKOCYTES UA: NEGATIVE
Nitrite: NEGATIVE
SPECIFIC GRAVITY, URINE: 1.02 (ref 1.000–1.030)
TOTAL PROTEIN, URINE-UPE24: NEGATIVE
URINE GLUCOSE: NEGATIVE
Urobilinogen, UA: 0.2 (ref 0.0–1.0)
WBC, UA: NONE SEEN (ref 0–?)
pH: 6 (ref 5.0–8.0)

## 2015-10-25 LAB — COMPREHENSIVE METABOLIC PANEL
ALK PHOS: 94 U/L (ref 39–117)
ALT: 12 U/L (ref 0–53)
AST: 14 U/L (ref 0–37)
Albumin: 3.7 g/dL (ref 3.5–5.2)
BUN: 21 mg/dL (ref 6–23)
CALCIUM: 9 mg/dL (ref 8.4–10.5)
CO2: 27 mEq/L (ref 19–32)
Chloride: 107 mEq/L (ref 96–112)
Creatinine, Ser: 0.83 mg/dL (ref 0.40–1.50)
GFR: 100.7 mL/min (ref >60.00)
Glucose, Bld: 91 mg/dL (ref 70–99)
POTASSIUM: 4.2 meq/L (ref 3.5–5.1)
SODIUM: 140 meq/L (ref 135–145)
TOTAL PROTEIN: 6.7 g/dL (ref 6.0–8.3)
Total Bilirubin: 0.6 mg/dL (ref 0.2–1.2)

## 2015-10-25 LAB — LIPID PANEL
Cholesterol: 157 mg/dL (ref 0–200)
HDL: 42.2 mg/dL (ref >39.00)
LDL CALC: 99 mg/dL (ref 0–99)
NONHDL: 115.03
Total CHOL/HDL Ratio: 4
Triglycerides: 78 mg/dL (ref 0.0–149.0)
VLDL: 15.6 mg/dL (ref 0.0–40.0)

## 2015-10-25 LAB — PSA: PSA: 0.59 ng/mL (ref 0.10–4.00)

## 2015-10-25 LAB — HEMOGLOBIN A1C: HEMOGLOBIN A1C: 5.9 % (ref 4.6–6.5)

## 2015-10-25 MED ORDER — DIAZEPAM 10 MG PO TABS: 10.0000 mg | ORAL_TABLET | Freq: Every day | ORAL | Status: DC

## 2015-10-25 MED ORDER — SERTRALINE HCL 100 MG PO TABS: 100.0000 mg | ORAL_TABLET | Freq: Every day | ORAL | Status: DC

## 2015-10-25 MED ORDER — PRAVASTATIN SODIUM 40 MG PO TABS: 40.0000 mg | ORAL_TABLET | Freq: Every day | ORAL | Status: DC

## 2015-10-25 MED ORDER — LOSARTAN POTASSIUM 50 MG PO TABS: 50.0000 mg | ORAL_TABLET | Freq: Every day | ORAL | Status: DC

## 2015-10-25 NOTE — Assessment & Plan Note (Signed)
Continue statin and ASA. Will repeat lipids today.

## 2015-10-25 NOTE — Progress Notes (Signed)
Pre visit review using our clinic review tool, if applicable. No additional management support is needed unless otherwise documented below in the visit note. 

## 2015-10-25 NOTE — Assessment & Plan Note (Signed)
Doing very well. Continue current regimen. Will check CMP today.

## 2015-10-25 NOTE — Assessment & Plan Note (Signed)
Depression screen negative. Health Maintenance reviewed -- immunizations up-to-date. Declines HIV screen. Will postpone Hep C screen and repeat colonoscopy until patient has insurance. Preventive schedule discussed and handout given in AVS. Will obtain fasting labs today.

## 2015-10-25 NOTE — Assessment & Plan Note (Signed)
Is doing well overall. Now that he is finsihed with surgeries, we can work on exercise a bit more. Regimen discussed.

## 2015-10-25 NOTE — Patient Instructions (Signed)
Please go to the lab for blood work.  I will call you with your results. If your blood work is normal we will follow-up yearly for physicals.   If anything is abnormal we will treat accordingly and get you in for a follow-up.  If your PSA testing looks good we will discuss starting a medication called Flomax to help with urinary symptoms. Stay hydrated but limit caffeine as this can irritate your bladder and make you need to urinate more frequently.  Preventive Care for Adults, Male A healthy lifestyle and preventive care can promote health and wellness. Preventive health guidelines for men include the following key practices:  A routine yearly physical is a good way to check with your health care provider about your health and preventative screening. It is a chance to share any concerns and updates on your health and to receive a thorough exam.  Visit your dentist for a routine exam and preventative care every 6 months. Brush your teeth twice a day and floss once a day. Good oral hygiene prevents tooth decay and gum disease.  The frequency of eye exams is based on your age, health, family medical history, use of contact lenses, and other factors. Follow your health care provider's recommendations for frequency of eye exams.  Eat a healthy diet. Foods such as vegetables, fruits, whole grains, low-fat dairy products, and lean protein foods contain the nutrients you need without too many calories. Decrease your intake of foods high in solid fats, added sugars, and salt. Eat the right amount of calories for you.Get information about a proper diet from your health care provider, if necessary.  Regular physical exercise is one of the most important things you can do for your health. Most adults should get at least 150 minutes of moderate-intensity exercise (any activity that increases your heart rate and causes you to sweat) each week. In addition, most adults need muscle-strengthening exercises on 2  or more days a week.  Maintain a healthy weight. The body mass index (BMI) is a screening tool to identify possible weight problems. It provides an estimate of body fat based on height and weight. Your health care provider can find your BMI and can help you achieve or maintain a healthy weight.For adults 20 years and older:  A BMI below 18.5 is considered underweight.  A BMI of 18.5 to 24.9 is normal.  A BMI of 25 to 29.9 is considered overweight.  A BMI of 30 and above is considered obese.  Maintain normal blood lipids and cholesterol levels by exercising and minimizing your intake of saturated fat. Eat a balanced diet with plenty of fruit and vegetables. Blood tests for lipids and cholesterol should begin at age 33 and be repeated every 5 years. If your lipid or cholesterol levels are high, you are over 50, or you are at high risk for heart disease, you may need your cholesterol levels checked more frequently.Ongoing high lipid and cholesterol levels should be treated with medicines if diet and exercise are not working.  If you smoke, find out from your health care provider how to quit. If you do not use tobacco, do not start.  Lung cancer screening is recommended for adults aged 95-80 years who are at high risk for developing lung cancer because of a history of smoking. A yearly low-dose CT scan of the lungs is recommended for people who have at least a 30-pack-year history of smoking and are a current smoker or have quit within the past  15 years. A pack year of smoking is smoking an average of 1 pack of cigarettes a day for 1 year (for example: 1 pack a day for 30 years or 2 packs a day for 15 years). Yearly screening should continue until the smoker has stopped smoking for at least 15 years. Yearly screening should be stopped for people who develop a health problem that would prevent them from having lung cancer treatment.  If you choose to drink alcohol, do not have more than 2 drinks per  day. One drink is considered to be 12 ounces (355 mL) of beer, 5 ounces (148 mL) of wine, or 1.5 ounces (44 mL) of liquor.  Avoid use of street drugs. Do not share needles with anyone. Ask for help if you need support or instructions about stopping the use of drugs.  High blood pressure causes heart disease and increases the risk of stroke. Your blood pressure should be checked at least every 1-2 years. Ongoing high blood pressure should be treated with medicines, if weight loss and exercise are not effective.  If you are 30-95 years old, ask your health care provider if you should take aspirin to prevent heart disease.  Diabetes screening is done by taking a blood sample to check your blood glucose level after you have not eaten for a certain period of time (fasting). If you are not overweight and you do not have risk factors for diabetes, you should be screened once every 3 years starting at age 27. If you are overweight or obese and you are 57-52 years of age, you should be screened for diabetes every year as part of your cardiovascular risk assessment.  Colorectal cancer can be detected and often prevented. Most routine colorectal cancer screening begins at the age of 33 and continues through age 24. However, your health care provider may recommend screening at an earlier age if you have risk factors for colon cancer. On a yearly basis, your health care provider may provide home test kits to check for hidden blood in the stool. Use of a small camera at the end of a tube to directly examine the colon (sigmoidoscopy or colonoscopy) can detect the earliest forms of colorectal cancer. Talk to your health care provider about this at age 63, when routine screening begins. Direct exam of the colon should be repeated every 5-10 years through age 66, unless early forms of precancerous polyps or small growths are found.  People who are at an increased risk for hepatitis B should be screened for this virus. You  are considered at high risk for hepatitis B if:  You were born in a country where hepatitis B occurs often. Talk with your health care provider about which countries are considered high risk.  Your parents were born in a high-risk country and you have not received a shot to protect against hepatitis B (hepatitis B vaccine).  You have HIV or AIDS.  You use needles to inject street drugs.  You live with, or have sex with, someone who has hepatitis B.  You are a man who has sex with other men (MSM).  You get hemodialysis treatment.  You take certain medicines for conditions such as cancer, organ transplantation, and autoimmune conditions.  Hepatitis C blood testing is recommended for all people born from [birth year] through 1965 and any individual with known risks for hepatitis C.  Practice safe sex. Use condoms and avoid high-risk sexual practices to reduce the spread of sexually transmitted infections (STIs).  STIs include gonorrhea, chlamydia, syphilis, trichomonas, herpes, HPV, and human immunodeficiency virus (HIV). Herpes, HIV, and HPV are viral illnesses that have no cure. They can result in disability, cancer, and death.  If you are a man who has sex with other men, you should be screened at least once per year for:  HIV.  Urethral, rectal, and pharyngeal infection of gonorrhea, chlamydia, or both.  If you are at risk of being infected with HIV, it is recommended that you take a prescription medicine daily to prevent HIV infection. This is called preexposure prophylaxis (PrEP). You are considered at risk if:  You are a man who has sex with other men (MSM) and have other risk factors.  You are a heterosexual man, are sexually active, and are at increased risk for HIV infection.  You take drugs by injection.  You are sexually active with a partner who has HIV.  Talk with your health care provider about whether you are at high risk of being infected with HIV. If you choose to begin  PrEP, you should first be tested for HIV. You should then be tested every 3 months for as long as you are taking PrEP.  A one-time screening for abdominal aortic aneurysm (AAA) and surgical repair of large AAAs by ultrasound are recommended for men ages 56 to 72 years who are current or former smokers.  Healthy men should no longer receive prostate-specific antigen (PSA) blood tests as part of routine cancer screening. Talk with your health care provider about prostate cancer screening.  Testicular cancer screening is not recommended for adult males who have no symptoms. Screening includes self-exam, a health care provider exam, and other screening tests. Consult with your health care provider about any symptoms you have or any concerns you have about testicular cancer.  Use sunscreen. Apply sunscreen liberally and repeatedly throughout the day. You should seek shade when your shadow is shorter than you. Protect yourself by wearing long sleeves, pants, a wide-brimmed hat, and sunglasses year round, whenever you are outdoors.  Once a month, do a whole-body skin exam, using a mirror to look at the skin on your back. Tell your health care provider about new moles, moles that have irregular borders, moles that are larger than a pencil eraser, or moles that have changed in shape or color.  Stay current with required vaccines (immunizations).  Influenza vaccine. All adults should be immunized every year.  Tetanus, diphtheria, and acellular pertussis (Td, Tdap) vaccine. An adult who has not previously received Tdap or who does not know his vaccine status should receive 1 dose of Tdap. This initial dose should be followed by tetanus and diphtheria toxoids (Td) booster doses every 10 years. Adults with an unknown or incomplete history of completing a 3-dose immunization series with Td-containing vaccines should begin or complete a primary immunization series including a Tdap dose. Adults should receive a Td  booster every 10 years.  Varicella vaccine. An adult without evidence of immunity to varicella should receive 2 doses or a second dose if he has previously received 1 dose.  Human papillomavirus (HPV) vaccine. Males aged 11-21 years who have not received the vaccine previously should receive the 3-dose series. Males aged 22-26 years may be immunized. Immunization is recommended through the age of 70 years for any male who has sex with males and did not get any or all doses earlier. Immunization is recommended for any person with an immunocompromised condition through the age of 77 years if he  did not get any or all doses earlier. During the 3-dose series, the second dose should be obtained 4-8 weeks after the first dose. The third dose should be obtained 24 weeks after the first dose and 16 weeks after the second dose.  Zoster vaccine. One dose is recommended for adults aged 80 years or older unless certain conditions are present.  Measles, mumps, and rubella (MMR) vaccine. Adults born before [birth year] generally are considered immune to measles and mumps. Adults born in [birth year] or later should have 1 or more doses of MMR vaccine unless there is a contraindication to the vaccine or there is laboratory evidence of immunity to each of the three diseases. A routine second dose of MMR vaccine should be obtained at least 28 days after the first dose for students attending postsecondary schools, health care workers, or international travelers. People who received inactivated measles vaccine or an unknown type of measles vaccine during 1963-1967 should receive 2 doses of MMR vaccine. People who received inactivated mumps vaccine or an unknown type of mumps vaccine before 1979 and are at high risk for mumps infection should consider immunization with 2 doses of MMR vaccine. Unvaccinated health care workers born before [birth year] who lack laboratory evidence of measles, mumps, or rubella immunity or laboratory confirmation of  disease should consider measles and mumps immunization with 2 doses of MMR vaccine or rubella immunization with 1 dose of MMR vaccine.  Pneumococcal 13-valent conjugate (PCV13) vaccine. When indicated, a person who is uncertain of his immunization history and has no record of immunization should receive the PCV13 vaccine. All adults 36 years of age and older should receive this vaccine. An adult aged 20 years or older who has certain medical conditions and has not been previously immunized should receive 1 dose of PCV13 vaccine. This PCV13 should be followed with a dose of pneumococcal polysaccharide (PPSV23) vaccine. Adults who are at high risk for pneumococcal disease should obtain the PPSV23 vaccine at least 8 weeks after the dose of PCV13 vaccine. Adults older than 59 years of age who have normal immune system function should obtain the PPSV23 vaccine dose at least 1 year after the dose of PCV13 vaccine.  Pneumococcal polysaccharide (PPSV23) vaccine. When PCV13 is also indicated, PCV13 should be obtained first. All adults aged 21 years and older should be immunized. An adult younger than age 15 years who has certain medical conditions should be immunized. Any person who resides in a nursing home or long-term care facility should be immunized. An adult smoker should be immunized. People with an immunocompromised condition and certain other conditions should receive both PCV13 and PPSV23 vaccines. People with human immunodeficiency virus (HIV) infection should be immunized as soon as possible after diagnosis. Immunization during chemotherapy or radiation therapy should be avoided. Routine use of PPSV23 vaccine is not recommended for American Indians, [place]atives, or people younger than 65 years unless there are medical conditions that require PPSV23 vaccine. When indicated, people who have unknown immunization and have no record of immunization should receive PPSV23 vaccine. One-time revaccination 5 years  after the first dose of PPSV23 is recommended for people aged 19-64 years who have chronic kidney failure, nephrotic syndrome, asplenia, or immunocompromised conditions. People who received 1-2 doses of PPSV23 before age 63 years should receive another dose of PPSV23 vaccine at age 33 years or later if at least 5 years have passed since the previous dose. Doses of PPSV23 are not needed for people immunized with PPSV23 at or after  age 44 years.  Meningococcal vaccine. Adults with asplenia or persistent complement component deficiencies should receive 2 doses of quadrivalent meningococcal conjugate (MenACWY-D) vaccine. The doses should be obtained at least 2 months apart. Microbiologists working with certain meningococcal bacteria, [employer] recruits, people at risk during an outbreak, and people who travel to or live in countries with a high rate of meningitis should be immunized. A first-year college student up through age 4 years who is living in a residence hall should receive a dose if he did not receive a dose on or after his 16th birthday. Adults who have certain high-risk conditions should receive one or more doses of vaccine.  Hepatitis A vaccine. Adults who wish to be protected from this disease, have chronic liver disease, work with hepatitis A-infected animals, work in hepatitis A research labs, or travel to or work in countries with a high rate of hepatitis A should be immunized. Adults who were previously unvaccinated and who anticipate close contact with an international adoptee during the first 60 days after arrival in the [place] from a country with a high rate of hepatitis A should be immunized.  Hepatitis B vaccine. Adults should be immunized if they wish to be protected from this disease, are under age 22 years and have diabetes, have chronic liver disease, have had more than one sex partner in the past 6 months, may be exposed to blood or other infectious body fluids, are household  contacts or sex partners of hepatitis B positive people, are clients or workers in certain care facilities, or travel to or work in countries with a high rate of hepatitis B.  Haemophilus influenzae type b (Hib) vaccine. A previously unvaccinated person with asplenia or sickle cell disease or having a scheduled splenectomy should receive 1 dose of Hib vaccine. Regardless of previous immunization, a recipient of a hematopoietic stem cell transplant should receive a 3-dose series 6-12 months after his successful transplant. Hib vaccine is not recommended for adults with HIV infection. Preventive Service / Frequency Ages 37 to 49  Blood pressure check.** / Every 3-5 years.  Lipid and cholesterol check.** / Every 5 years beginning at age 61.  Hepatitis C blood test.** / For any individual with known risks for hepatitis C.  Skin self-exam. / Monthly.  Influenza vaccine. / Every year.  Tetanus, diphtheria, and acellular pertussis (Tdap, Td) vaccine.** / Consult your health care provider. 1 dose of Td every 10 years.  Varicella vaccine.** / Consult your health care provider.  HPV vaccine. / 3 doses over 6 months, if 22 or younger.  Measles, mumps, rubella (MMR) vaccine.** / You need at least 1 dose of MMR if you were born in 1957 or later. You may also need a second dose.  Pneumococcal 13-valent conjugate (PCV13) vaccine.** / Consult your health care provider.  Pneumococcal polysaccharide (PPSV23) vaccine.** / 1 to 2 doses if you smoke cigarettes or if you have certain conditions.  Meningococcal vaccine.** / 1 dose if you are age 69 to 71 years and a Market researcher living in a residence hall, or have one of several medical conditions. You may also need additional booster doses.  Hepatitis A vaccine.** / Consult your health care provider.  Hepatitis B vaccine.** / Consult your health care provider.  Haemophilus influenzae type b (Hib) vaccine.** / Consult your health care  provider. Ages 62 to 65  Blood pressure check.** / Every year.  Lipid and cholesterol check.** / Every 5 years beginning at age 50.  Lung cancer screening. / Every year if you are aged 56-80 years and have a 30-pack-year history of smoking and currently smoke or have quit within the past 15 years. Yearly screening is stopped once you have quit smoking for at least 15 years or develop a health problem that would prevent you from having lung cancer treatment.  Fecal occult blood test (FOBT) of stool. / Every year beginning at age 93 and continuing until age 27. You may not have to do this test if you get a colonoscopy every 10 years.  Flexible sigmoidoscopy** or colonoscopy.** / Every 5 years for a flexible sigmoidoscopy or every 10 years for a colonoscopy beginning at age 38 and continuing until age 51.  Hepatitis C blood test.** / For all people born from [birth year] through 1965 and any individual with known risks for hepatitis C.  Skin self-exam. / Monthly.  Influenza vaccine. / Every year.  Tetanus, diphtheria, and acellular pertussis (Tdap/Td) vaccine.** / Consult your health care provider. 1 dose of Td every 10 years.  Varicella vaccine.** / Consult your health care provider.  Zoster vaccine.** / 1 dose for adults aged 21 years or older.  Measles, mumps, rubella (MMR) vaccine.** / You need at least 1 dose of MMR if you were born in 1956 or later. You may also need a second dose.  Pneumococcal 13-valent conjugate (PCV13) vaccine.** / Consult your health care provider.  Pneumococcal polysaccharide (PPSV23) vaccine.** / 1 to 2 doses if you smoke cigarettes or if you have certain conditions.  Meningococcal vaccine.** / Consult your health care provider.  Hepatitis A vaccine.** / Consult your health care provider.  Hepatitis B vaccine.** / Consult your health care provider.  Haemophilus influenzae type b (Hib) vaccine.** / Consult your health care provider. Ages 83 and over  Blood  pressure check.** / Every year.  Lipid and cholesterol check.**/ Every 5 years beginning at age 57.  Lung cancer screening. / Every year if you are aged 50-80 years and have a 30-pack-year history of smoking and currently smoke or have quit within the past 15 years. Yearly screening is stopped once you have quit smoking for at least 15 years or develop a health problem that would prevent you from having lung cancer treatment.  Fecal occult blood test (FOBT) of stool. / Every year beginning at age 35 and continuing until age 29. You may not have to do this test if you get a colonoscopy every 10 years.  Flexible sigmoidoscopy** or colonoscopy.** / Every 5 years for a flexible sigmoidoscopy or every 10 years for a colonoscopy beginning at age 44 and continuing until age 13.  Hepatitis C blood test.** / For all people born from [birth year] through 1964 and any individual with known risks for hepatitis C.  Abdominal aortic aneurysm (AAA) screening.** / A one-time screening for ages 53 to 7 years who are current or former smokers.  Skin self-exam. / Monthly.  Influenza vaccine. / Every year.  Tetanus, diphtheria, and acellular pertussis (Tdap/Td) vaccine.** / 1 dose of Td every 10 years.  Varicella vaccine.** / Consult your health care provider.  Zoster vaccine.** / 1 dose for adults aged 54 years or older.  Pneumococcal 13-valent conjugate (PCV13) vaccine.** / 1 dose for all adults aged 91 years and older.  Pneumococcal polysaccharide (PPSV23) vaccine.** / 1 dose for all adults aged 75 years and older.  Meningococcal vaccine.** / Consult your health care provider.  Hepatitis A vaccine.** / Consult your health care provider.  Hepatitis  B vaccine.** / Consult your health care provider.  Haemophilus influenzae type b (Hib) vaccine.** / Consult your health care provider. **Family history and personal history of risk and conditions may change your health care provider's recommendations.   This  information is not intended to replace advice given to you by your health care provider. Make sure you discuss any questions you have with your health care provider.   Document Released: 08/29/2001 Document Revised: 07/24/2014 Document Reviewed: 11/28/2010 Elsevier Interactive Patient Education [employer].

## 2015-10-25 NOTE — Assessment & Plan Note (Signed)
DRE performed due to hesitancy and nocturia. Mild enlargement noted. No evidence of nodules. Will check PSA today. Discussed Flomax for symptoms. Patient will give some thought.

## 2015-10-25 NOTE — Progress Notes (Signed)
Patient presents to clinic today for annual exam.  Patient is fasting for labs.  Chronic Issues: Hypertension -- Endorses taking medications as directed. Patient denies chest pain, palpitations, lightheadedness, dizziness, vision changes or frequent headaches. Is followed by Cardiology for history of Cardiomyopathy.   BP Readings from Last 3 Encounters:  10/25/15 110/76  05/29/15 104/70  05/21/15 103/70   Hyperlipidemia -- Is taking 81 mg ASA and Pravastatin daily as directed. Is tolerating without myalgias.  Anxiety and Depression -- Endorses doing very well on sertraline. Is taking medication as directed. Denies breakthrough symptoms.  Obesity -- Body mass index is 30.17 kg/(m^2). Endorses well-balanced diet. Is working on exercise regimen, but that has been limited due to orthopedic issues.  Health Maintenance: Immunizations -- flu and tetanus.  Colonoscopy -- is due for repeat colonoscopy. Patient without insurance at present. Will schedule repeat colonoscopy once insurance goes into effect.  Past Medical History  Diagnosis Date  . Dyslipidemia, goal LDL below 100   . Essential hypertension   . Obesity (BMI 30.0-34.9)   . Family history of premature CAD   . Nonischemic cardiomyopathy ([place]) 05/2013    Non-ischemic: EF ~45% by Echo & Myoview --> Non-obstructive CAD  . Coronary artery disease, non-occlusive 08/2013    Mild to moderate (40% OM1) single vessel CAD. Otherwise nonobstructed.  . Anxiety   . Depression   . GERD (gastroesophageal reflux disease)   . Anxiety   . H/O skin disorder     Involving hands. Subsequently resolved.   . Anginal pain ([place])   . Arthritis     Past Surgical History  Procedure Laterality Date  . Nm myoview ltd  06/03/2013    Low risk study with no ischemia. EF roughly 44% with no regional wall motion abnormalities noted  . Transthoracic echocardiogram  06/11/2013    Mildly reduced EF: 45-50%. Mild anterior hypokinesis with incoordinate  septal motion. No evidence of pulmonary hypertension  . Lower extremity arterial dopplers  06/11/2013    No occlusive disease  . Pfts  06/2013    Normal Volumes &  Spirometry; Moderately reduced DLCO  . Left heart catheterization with coronary angiogram  08/26/2013    Mild to moderate disease with 40-50% stenosis and OM1. Otherwise no significant CAD --  Surgeon: Leonie Man, MD;  Location: [facility] CATH LAB;  Service: Cardiovascular;;  . Lumbar laminectomy/decompression microdiscectomy Right 10/07/2014    Procedure: LUMBAR LAMINECTOMY/DECOMPRESSION MICRODISCECTOMY 1 LEVEL;  Surgeon: Kary Kos, MD;  Location: [facility] NEURO ORS;  Service: Neurosurgery;  Laterality: Right;  Right L3-L4 Microdiscectomy  . Shoulder hemi-arthroplasty Right 05/28/2015    Procedure: RIGHT SHOULDER HEMI-ARTHROPLASTY CTA HEAD AND SUBSCAP REPAIR ;  Surgeon: Netta Cedars, MD;  Location: [facility];  Service: Orthopedics;  Laterality: Right;    Current Outpatient Prescriptions on File Prior to Visit  Medication Sig Dispense Refill  . acetaminophen (TYLENOL) 325 MG tablet Take 650 mg by mouth 2 (two) times daily as needed for mild pain.     Marland Kitchen aspirin EC 81 MG tablet Take 81 mg by mouth daily.    Marland Kitchen HYDROcodone-acetaminophen (NORCO) 10-325 MG per tablet Take 1 tablet by mouth every 6 (six) hours as needed (pain).   0  . methocarbamol (ROBAXIN) 750 MG tablet Take 1 tablet by mouth every 4 (four) hours as needed for muscle spasms.   1  . nitroGLYCERIN (NITROSTAT) 0.4 MG SL tablet Place 1 tablet (0.4 mg total) under the tongue every 5 (five) minutes as needed  for chest pain. 30 tablet 1  . omeprazole (PRILOSEC OTC) 20 MG tablet Take 20 mg by mouth daily.    Marland Kitchen senna-docusate (SENOKOT-S) 8.6-50 MG tablet Take 1 tablet by mouth daily.     No current facility-administered medications on file prior to visit.    Allergies  Allergen Reactions  . Isosorbide Nitrate Other (See Comments)    CONTINUOUS HEADACHE   . Oxycodone Itching    Reaction  was to straight oxycodone 15 mg tablets - no reaction to percocet  . Penicillins Hives and Rash    Has patient had a PCN reaction causing immediate rash, facial/tongue/throat swelling, SOB or lightheadedness with hypotension: Yes Has patient had a PCN reaction causing severe rash involving mucus membranes or skin necrosis: No Has patient had a PCN reaction that required hospitalization No Has patient had a PCN reaction occurring within the last 10 years: No If all of the above answers are "NO", then may proceed with Cephalosporin use.    Family History  Problem Relation Age of Onset  . Atrial fibrillation Mother     Alive at 66  . COPD Mother   . Hypertension Mother   . Hypertension Father     Alive at 35  . Lung cancer Father     Chronic smoker  . Factor V Leiden deficiency Father     Also Lupus anticoagulant  . Heart attack Maternal Grandmother 48  . Heart failure Paternal Grandmother   . Diabetes Paternal Grandmother   . Heart attack Brother 56    X2  . Heart attack Sister 76    Deceased  . Healthy Sister     x2  . Healthy Son     x2  . Colon cancer Neg Hx   . Esophageal cancer Neg Hx   . Rectal cancer Neg Hx   . Stomach cancer Neg Hx     Social History   Social History  . Marital Status: Married    Spouse Name: N/A  . Number of Children: N/A  . Years of Education: N/A   Occupational History  . Not on file.   Social History Main Topics  . Smoking status: Current Every Day Smoker -- 1.00 packs/day    Types: Cigarettes    Last Attempt to Quit: 08/11/2013  . Smokeless tobacco: Never Used  . Alcohol Use: 0.0 oz/week    0 Standard drinks or equivalent per week     Comment: rarely  . Drug Use: No  . Sexual Activity: Not on file   Other Topics Concern  . Not on file   Social History Narrative   Married father of 2 boys. Lives with wife, [name], and one son.   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   Only occasional beer and mixed drinks.   Does not exercise because he is too tired.    Review of Systems  Constitutional: Negative for fever and weight loss.  HENT: Negative for ear discharge, ear pain, hearing loss and tinnitus.   Eyes: Negative for blurred vision, double vision, photophobia and pain.  Respiratory: Negative for cough and shortness of breath.   Cardiovascular: Negative for chest pain and palpitations.  Gastrointestinal: Negative for heartburn, nausea, vomiting, abdominal pain, diarrhea, constipation, blood in stool and melena.  Genitourinary: Negative for dysuria, urgency, frequency, hematuria and flank pain.       +  hesitancy. Nocturia x 2  Musculoskeletal: Negative for falls.  Neurological: Negative for dizziness, loss of consciousness and headaches.  Endo/Heme/Allergies: Negative for environmental allergies.  Psychiatric/Behavioral: Negative for depression, suicidal ideas, hallucinations and substance abuse. The patient is not nervous/anxious and does not have insomnia.     BP 110/76 mmHg  Pulse 75  Temp(Src) 98.2 F (36.8 C) (Oral)  Ht '5\' 11"'  (1.803 m)  Wt 216 lb 3.2 oz (98.068 kg)  BMI 30.17 kg/m2  SpO2 96%  Physical Exam  Constitutional: He is oriented to person, place, and time and well-developed, well-nourished, and in no distress.  HENT:  Head: Normocephalic and atraumatic.  Right Ear: External ear normal.  Left Ear: External ear normal.  Nose: Nose normal.  Mouth/Throat: Oropharynx is clear and moist. No oropharyngeal exudate.  Eyes: Conjunctivae and EOM are normal. Pupils are equal, round, and reactive to light.  Neck: Neck supple. No thyromegaly present.  Cardiovascular: Normal rate, regular rhythm, normal heart sounds and intact distal pulses.   Pulmonary/Chest: Effort normal and breath sounds normal. No respiratory distress. He has no wheezes. He has no rales. He exhibits no  tenderness.  Abdominal: Soft. Bowel sounds are normal. He exhibits no distension and no mass. There is no tenderness. There is no rebound and no guarding.  Genitourinary: Testes/scrotum normal and penis normal. Prostate is enlarged. Prostate is not tender. No discharge found.  Mild prostate enlargement palpable on examination. No palpable nodules.  Lymphadenopathy:    He has no cervical adenopathy.  Neurological: He is alert and oriented to person, place, and time.  Skin: Skin is warm and dry. No rash noted.  Psychiatric: Affect normal.  Vitals reviewed.   Recent Results (from the past 2160 hour(s))  Lipid Profile     Status: Abnormal   Collection Time: 09/21/15  7:05 AM  Result Value Ref Range   Cholesterol 189 0 - 200 mg/dL    Comment: ATP III Classification       Desirable:  < 200 mg/dL               Borderline High:  200 - 239 mg/dL          High:  > = 240 mg/dL   Triglycerides 79.0 0.0 - 149.0 mg/dL    Comment: Normal:  <150 mg/dLBorderline High:  150 - 199 mg/dL   HDL 47.10 >39.00 mg/dL   VLDL 15.8 0.0 - 40.0 mg/dL   LDL Cholesterol 126 (H) 0 - 99 mg/dL   Total CHOL/HDL Ratio 4     Comment:                Men          Women1/2 Average Risk     3.4          3.3Average Risk          5.0          4.42X Average Risk          9.6          7.13X Average Risk          15.0          11.0                       NonHDL 141.65     Comment: NOTE:  Non-HDL goal should be 30 mg/dL higher than patient's LDL goal (i.e. LDL goal of < 70 mg/dL, would have non-HDL goal of <  100 mg/dL)  Comp Met (CMET)     Status: Abnormal   Collection Time: 09/21/15  7:05 AM  Result Value Ref Range   Sodium 141 135 - 145 mEq/L   Potassium 3.8 3.5 - 5.1 mEq/L   Chloride 109 96 - 112 mEq/L   CO2 24 19 - 32 mEq/L   Glucose, Bld 87 70 - 99 mg/dL   BUN 24 (H) 6 - 23 mg/dL   Creatinine, Ser 0.85 0.40 - 1.50 mg/dL   Total Bilirubin 0.4 0.2 - 1.2 mg/dL   Alkaline Phosphatase 88 39 - 117 U/L   AST 13 0 - 37 U/L    ALT 14 0 - 53 U/L   Total Protein 6.7 6.0 - 8.3 g/dL   Albumin 3.6 3.5 - 5.2 g/dL   Calcium 8.8 8.4 - 10.5 mg/dL   GFR 98.00 >60.00 mL/min  Comp Met (CMET)     Status: None   Collection Time: 10/25/15  8:59 AM  Result Value Ref Range   Sodium 140 135 - 145 mEq/L   Potassium 4.2 3.5 - 5.1 mEq/L   Chloride 107 96 - 112 mEq/L   CO2 27 19 - 32 mEq/L   Glucose, Bld 91 70 - 99 mg/dL   BUN 21 6 - 23 mg/dL   Creatinine, Ser 0.83 0.40 - 1.50 mg/dL   Total Bilirubin 0.6 0.2 - 1.2 mg/dL   Alkaline Phosphatase 94 39 - 117 U/L   AST 14 0 - 37 U/L   ALT 12 0 - 53 U/L   Total Protein 6.7 6.0 - 8.3 g/dL   Albumin 3.7 3.5 - 5.2 g/dL   Calcium 9.0 8.4 - 10.5 mg/dL   GFR 100.70 >60.00 mL/min  Hemoglobin A1c     Status: None   Collection Time: 10/25/15  8:59 AM  Result Value Ref Range   Hgb A1c MFr Bld 5.9 4.6 - 6.5 %    Comment: Glycemic Control Guidelines for People with Diabetes:Non Diabetic:  <6%Goal of Therapy: <7%Additional Action Suggested:  >8%   Urinalysis, Routine w reflex microscopic     Status: Abnormal   Collection Time: 10/25/15  8:59 AM  Result Value Ref Range   Color, Urine YELLOW Yellow;Lt. Yellow   APPearance CLEAR Clear   Specific Gravity, Urine 1.020 1.000-1.030   pH 6.0 5.0 - 8.0   Total Protein, Urine NEGATIVE Negative   Urine Glucose NEGATIVE Negative   Ketones, ur NEGATIVE Negative   Bilirubin Urine NEGATIVE Negative   Hgb urine dipstick MODERATE (A) Negative   Urobilinogen, UA 0.2 0.0 - 1.0   Leukocytes, UA NEGATIVE Negative   Nitrite NEGATIVE Negative   WBC, UA none seen 0-2/hpf   RBC / HPF 3-6/hpf (A) 0-2/hpf  Lipid Profile     Status: None   Collection Time: 10/25/15  8:59 AM  Result Value Ref Range   Cholesterol 157 0 - 200 mg/dL    Comment: ATP III Classification       Desirable:  < 200 mg/dL               Borderline High:  200 - 239 mg/dL          High:  > = 240 mg/dL   Triglycerides 78.0 0.0 - 149.0 mg/dL    Comment: Normal:  <150 mg/dLBorderline High:   150 - 199 mg/dL   HDL 42.20 >39.00 mg/dL   VLDL 15.6 0.0 - 40.0 mg/dL   LDL Cholesterol 99 0 - 99 mg/dL  Total CHOL/HDL Ratio 4     Comment:                Men          Women1/2 Average Risk     3.4          3.3Average Risk          5.0          4.42X Average Risk          9.6          7.13X Average Risk          15.0          11.0                       NonHDL 115.03     Comment: NOTE:  Non-HDL goal should be 30 mg/dL higher than patient's LDL goal (i.e. LDL goal of < 70 mg/dL, would have non-HDL goal of < 100 mg/dL)  PSA     Status: None   Collection Time: 10/25/15  8:59 AM  Result Value Ref Range   PSA 0.59 0.10 - 4.00 ng/mL    Assessment/Plan: Visit for preventive health examination Depression screen negative. Health Maintenance reviewed -- immunizations up-to-date. Declines HIV screen. Will postpone Hep C screen and repeat colonoscopy until patient has insurance. Preventive schedule discussed and handout given in AVS. Will obtain fasting labs today.   Prostate cancer screening DRE performed due to hesitancy and nocturia. Mild enlargement noted. No evidence of nodules. Will check PSA today. Discussed Flomax for symptoms. Patient will give some thought.  Obesity (BMI 30-39.9) Is doing well overall. Now that he is finsihed with surgeries, we can work on exercise a bit more. Regimen discussed.  Dyslipidemia, goal LDL below 100 Continue statin and ASA. Will repeat lipids today.  Mild essential hypertension Doing very well. Continue current regimen. Will check CMP today.

## 2015-10-26 ENCOUNTER — Telehealth: Payer: Self-pay | Admitting: *Deleted

## 2015-10-26 DIAGNOSIS — R8290 Unspecified abnormal findings in urine: Secondary | ICD-10-CM

## 2015-10-26 DIAGNOSIS — R319 Hematuria, unspecified: Secondary | ICD-10-CM

## 2015-10-26 NOTE — Telephone Encounter (Signed)
-----   Message from Brunetta Jeans, PA-C sent at 10/25/2015  9:00 PM EDT ----- Labs are fantastic overall. Will continue chronic medications and FU in 6 months for chronic issues. His urine has evidence of red blood cells. Has he noted any blood in the urine? If not would like to repeat UA in 1 week to reassess. If still present will need Urology referral.

## 2015-10-26 NOTE — Telephone Encounter (Signed)
Spoke with the pt's wife and informed her of the pt's recent lab results and note.  She verbalized understanding.  She stated that the pt stated that he has not noticed blood in his urine.  Pt was scheduled for lab appt for (Tues.11/02/15 @ 8:15am) and 6 month follow for (Wed-04/26/16 @ 3:00pm).   Future lab ordered and sent.//AB/CMA

## 2015-11-02 ENCOUNTER — Other Ambulatory Visit (INDEPENDENT_AMBULATORY_CARE_PROVIDER_SITE_OTHER): Payer: Self-pay

## 2015-11-02 DIAGNOSIS — R8290 Unspecified abnormal findings in urine: Secondary | ICD-10-CM

## 2015-11-02 DIAGNOSIS — R319 Hematuria, unspecified: Secondary | ICD-10-CM

## 2015-11-02 LAB — URINALYSIS, ROUTINE W REFLEX MICROSCOPIC
Bilirubin Urine: NEGATIVE
Ketones, ur: NEGATIVE
Leukocytes, UA: NEGATIVE
Nitrite: NEGATIVE
Specific Gravity, Urine: 1.025
Total Protein, Urine: NEGATIVE
Urine Glucose: NEGATIVE
Urobilinogen, UA: 0.2
pH: 6 (ref 5.0–8.0)

## 2016-02-02 ENCOUNTER — Encounter: Payer: Self-pay | Admitting: Physician Assistant

## 2016-04-24 ENCOUNTER — Telehealth: Payer: Self-pay | Admitting: Physician Assistant

## 2016-04-24 NOTE — Telephone Encounter (Signed)
Lft message on voicemail 10/6, and 10/9 for patient to call and reschedule appointment scheduled for 10/11. Also called and left message on authorized party (spouse) [name] @ [phone] to ensure that patient was aware of the cancellation.

## 2016-04-26 ENCOUNTER — Ambulatory Visit: Payer: Self-pay | Admitting: Physician Assistant

## 2016-05-02 ENCOUNTER — Telehealth: Payer: Self-pay | Admitting: Physician Assistant

## 2016-05-02 ENCOUNTER — Encounter: Payer: Self-pay | Admitting: Physician Assistant

## 2016-05-02 ENCOUNTER — Ambulatory Visit (INDEPENDENT_AMBULATORY_CARE_PROVIDER_SITE_OTHER): Payer: Self-pay | Admitting: Physician Assistant

## 2016-05-02 VITALS — BP 130/80 | HR 65 | Temp 98.1°F | Resp 16 | Ht 71.0 in | Wt 211.4 lb

## 2016-05-02 DIAGNOSIS — F329 Major depressive disorder, single episode, unspecified: Secondary | ICD-10-CM

## 2016-05-02 DIAGNOSIS — M25561 Pain in right knee: Secondary | ICD-10-CM

## 2016-05-02 DIAGNOSIS — F419 Anxiety disorder, unspecified: Principal | ICD-10-CM

## 2016-05-02 DIAGNOSIS — F418 Other specified anxiety disorders: Secondary | ICD-10-CM

## 2016-05-02 DIAGNOSIS — I10 Essential (primary) hypertension: Secondary | ICD-10-CM

## 2016-05-02 DIAGNOSIS — E785 Hyperlipidemia, unspecified: Secondary | ICD-10-CM

## 2016-05-02 MED ORDER — MELOXICAM 15 MG PO TABS: 15.0000 mg | ORAL_TABLET | Freq: Every day | ORAL | 0 refills | Status: DC

## 2016-05-02 MED ORDER — SERTRALINE HCL 100 MG PO TABS: 100.0000 mg | ORAL_TABLET | Freq: Every day | ORAL | 5 refills | Status: DC

## 2016-05-02 MED ORDER — DIAZEPAM 10 MG PO TABS: 10.0000 mg | ORAL_TABLET | Freq: Every day | ORAL | 2 refills | Status: DC

## 2016-05-02 MED ORDER — PRAVASTATIN SODIUM 40 MG PO TABS: 40.0000 mg | ORAL_TABLET | Freq: Every day | ORAL | 5 refills | Status: DC

## 2016-05-02 MED ORDER — LOSARTAN POTASSIUM 50 MG PO TABS: 50.0000 mg | ORAL_TABLET | Freq: Every day | ORAL | 5 refills | Status: DC

## 2016-05-02 NOTE — Telephone Encounter (Signed)
Pt would like to switch providers to Dr. Nani Ravens.

## 2016-05-02 NOTE — Patient Instructions (Signed)
Please go to the lab for blood work. I will call with your results. Please continue your medications as directed.  Elevate the leg while resting. Ice the leg.  Get a knee sleeve and wear. Take the Mobic as directed with food. Let me know if symptoms are not resolving.

## 2016-05-02 NOTE — Progress Notes (Signed)
Patient presents to clinic today for  6 month follow-up. Patient also with acute complaints today. Patient endorses medial R knee pain over the past few days. Denies trauma or injury. Denies swelling, numbness or tingling. Pain is aching in nature. Sometimes radiating to posterior knee.   Hypertension -- Currently on losartan. Taking daily as directed. Patient denies chest pain, palpitations, lightheadedness, dizziness, vision changes or frequent headaches.  BP Readings from Last 3 Encounters:  05/02/16 130/80  10/25/15 110/76  05/29/15 104/70   Hyperlipidemia -- Taking 81 mg ASA daily. Is also on Pravachol 40 mg daily. Taking as directed. Denies side effect of medication. Denies regular exercise at present. Is watching diet.   Depression/Anxiety -- Patient currently on Sertraline. Endorses taking daily as directed with great relief of symptoms. Denies breakthrough symptoms. Denies SI/HI.  Past Medical History:  Diagnosis Date  . Anginal pain ([place])   . Anxiety   . Anxiety   . Arthritis   . Coronary artery disease, non-occlusive 08/2013   Mild to moderate (40% OM1) single vessel CAD. Otherwise nonobstructed.  . Depression   . Dyslipidemia, goal LDL below 100   . Essential hypertension   . Family history of premature CAD   . GERD (gastroesophageal reflux disease)   . H/O skin disorder    Involving hands. Subsequently resolved.   . Nonischemic cardiomyopathy ([place]) 05/2013   Non-ischemic: EF ~45% by Echo & Myoview --> Non-obstructive CAD  . Obesity (BMI 30.0-34.9)     Current Outpatient Prescriptions on File Prior to Visit  Medication Sig Dispense Refill  . aspirin EC 81 MG tablet Take 81 mg by mouth daily.    . diazepam (VALIUM) 10 MG tablet Take 1 tablet (10 mg total) by mouth at bedtime. 30 tablet 2  . losartan (COZAAR) 50 MG tablet Take 1 tablet (50 mg total) by mouth daily. 30 tablet 5  . nitroGLYCERIN (NITROSTAT) 0.4 MG SL tablet Place 1 tablet (0.4 mg total) under the  tongue every 5 (five) minutes as needed for chest pain. 30 tablet 1  . omeprazole (PRILOSEC OTC) 20 MG tablet Take 20 mg by mouth daily.    . pravastatin (PRAVACHOL) 40 MG tablet Take 1 tablet (40 mg total) by mouth daily. 30 tablet 5  . senna-docusate (SENOKOT-S) 8.6-50 MG tablet Take 1 tablet by mouth daily.    . sertraline (ZOLOFT) 100 MG tablet Take 1 tablet (100 mg total) by mouth daily. 30 tablet 5   No current facility-administered medications on file prior to visit.     Allergies  Allergen Reactions  . Isosorbide Nitrate Other (See Comments)    CONTINUOUS HEADACHE   . Oxycodone Itching    Reaction was to straight oxycodone 15 mg tablets - no reaction to percocet  . Penicillins Hives and Rash    Has patient had a PCN reaction causing immediate rash, facial/tongue/throat swelling, SOB or lightheadedness with hypotension: Yes Has patient had a PCN reaction causing severe rash involving mucus membranes or skin necrosis: No Has patient had a PCN reaction that required hospitalization No Has patient had a PCN reaction occurring within the last 10 years: No If all of the above answers are "NO", then may proceed with Cephalosporin use.    Family History  Problem Relation Age of Onset  . Atrial fibrillation Mother     Alive at 31  . COPD Mother   . Hypertension Mother   . Hypertension Father     Alive at 52  .  Lung cancer Father     Chronic smoker  . Factor V Leiden deficiency Father     Also Lupus anticoagulant  . Heart attack Maternal Grandmother 48  . Heart failure Paternal Grandmother   . Diabetes Paternal Grandmother   . Heart attack Brother 33    X2  . Heart attack Sister 45    Deceased  . Healthy Sister     x2  . Healthy Son     x2  . Colon cancer Neg Hx   . Esophageal cancer Neg Hx   . Rectal cancer Neg Hx   . Stomach cancer Neg Hx     Social History   Social History  . Marital status: Married    Spouse name: N/A  . Number of children: N/A  . Years of  education: N/A   Social History Main Topics  . Smoking status: Current Every Day Smoker    Packs/day: 1.00    Types: Cigarettes    Last attempt to quit: 08/11/2013  . Smokeless tobacco: Never Used  . Alcohol use 0.0 oz/week     Comment: rarely  . Drug use: No  . Sexual activity: Not Asked   Other Topics Concern  . None   Social History Narrative   Married father of 2 boys. Lives with wife, [name], and one son.   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   Only occasional beer and mixed drinks.   Does not exercise because he is too tired.    Review of Systems - See HPI.  All other ROS are negative.  BP 130/80 (BP Location: Left Arm, Patient Position: Sitting, Cuff Size: Large)   Pulse 65   Temp 98.1 F (36.7 C) (Oral)   Resp 16   Ht 5\' 11"  (1.803 m)   Wt 211 lb 6 oz (95.9 kg)   SpO2 98%   BMI 29.48 kg/m   Physical Exam  Constitutional: He is oriented to person, place, and time and well-developed, well-nourished, and in no distress.  HENT:  Head: Normocephalic and atraumatic.  Eyes: Conjunctivae are normal.  Neck: Neck supple.  Cardiovascular: Normal rate, regular rhythm, normal heart sounds and intact distal pulses.   Pulmonary/Chest: Effort normal and breath sounds normal. No respiratory distress. He has no wheezes. He has no rales. He exhibits no tenderness.  Musculoskeletal:       Right knee: He exhibits normal range of motion, no swelling, normal patellar mobility and normal meniscus. Tenderness found. Medial joint line tenderness noted.  Neurological: He is alert and oriented to person, place, and time.  Skin: Skin is warm and dry. No rash noted.  Psychiatric: Affect normal.  Vitals reviewed.  Assessment/Plan: 1. Anxiety and depression Doing very well. No changes. - sertraline (ZOLOFT) 100 MG tablet; Take 1 tablet (100 mg total) by mouth  daily.  Dispense: 30 tablet; Refill: 5  2. Hyperlipidemia, unspecified hyperlipidemia type Will continue current regimen. Will check labs at FU. Dietary and exercise recommendations given. - pravastatin (PRAVACHOL) 40 MG tablet; Take 1 tablet (40 mg total) by mouth daily.  Dispense: 30 tablet; Refill: 5  3. Essential hypertension, benign BP stable. Asymptomatic. Will check BMP today. - losartan (COZAAR) 50 MG tablet; Take 1 tablet (50 mg total) by mouth daily.  Dispense: 30 tablet; Refill: 5 - Basic metabolic panel  4. Acute pain of  right knee Mild. Atraumatic. No abnormal meniscal signs. RICE. Medications discussed. FU if not resolving.   Leeanne Rio, PA-C

## 2016-05-02 NOTE — Telephone Encounter (Signed)
Fine with me

## 2016-05-03 LAB — BASIC METABOLIC PANEL
BUN: 20 mg/dL (ref 6–23)
CHLORIDE: 107 meq/L (ref 96–112)
CO2: 26 meq/L (ref 19–32)
Calcium: 8.7 mg/dL (ref 8.4–10.5)
Creatinine, Ser: 0.91 mg/dL (ref 0.40–1.50)
GFR: 90.39 mL/min (ref >60.00)
Glucose, Bld: 83 mg/dL (ref 70–99)
POTASSIUM: 4.3 meq/L (ref 3.5–5.1)
Sodium: 139 mEq/L (ref 135–145)

## 2016-05-30 ENCOUNTER — Telehealth: Payer: Self-pay | Admitting: Physician Assistant

## 2016-05-30 DIAGNOSIS — I10 Essential (primary) hypertension: Secondary | ICD-10-CM

## 2016-05-30 MED ORDER — NITROGLYCERIN 0.4 MG SL SUBL: 0.4000 mg | SUBLINGUAL_TABLET | SUBLINGUAL | 1 refills | Status: DC | PRN

## 2016-05-30 NOTE — Telephone Encounter (Signed)
Refill sent.

## 2016-05-30 NOTE — Telephone Encounter (Signed)
Pt's spouse [name] called in to request a medication refill  for nitroGLYCERIN     Pharmacy: [facility] on [address]

## 2016-06-02 NOTE — Telephone Encounter (Signed)
I don't rx Valium for sleep and/or anxiety. If he is OK with coming off of this, I am happy to see him. TY.

## 2016-06-06 NOTE — Telephone Encounter (Signed)
Called pt back to advise. Asked pt to call back discuss further.

## 2016-08-21 IMAGING — CR DG SHOULDER 2+V PORT*R*
1 series · 1 of 1 positions shown · non-contrast
Comparison: None

CLINICAL DATA: Right shoulder replacement post op

EXAM:
PORTABLE RIGHT SHOULDER - 2+ VIEW

[AP]
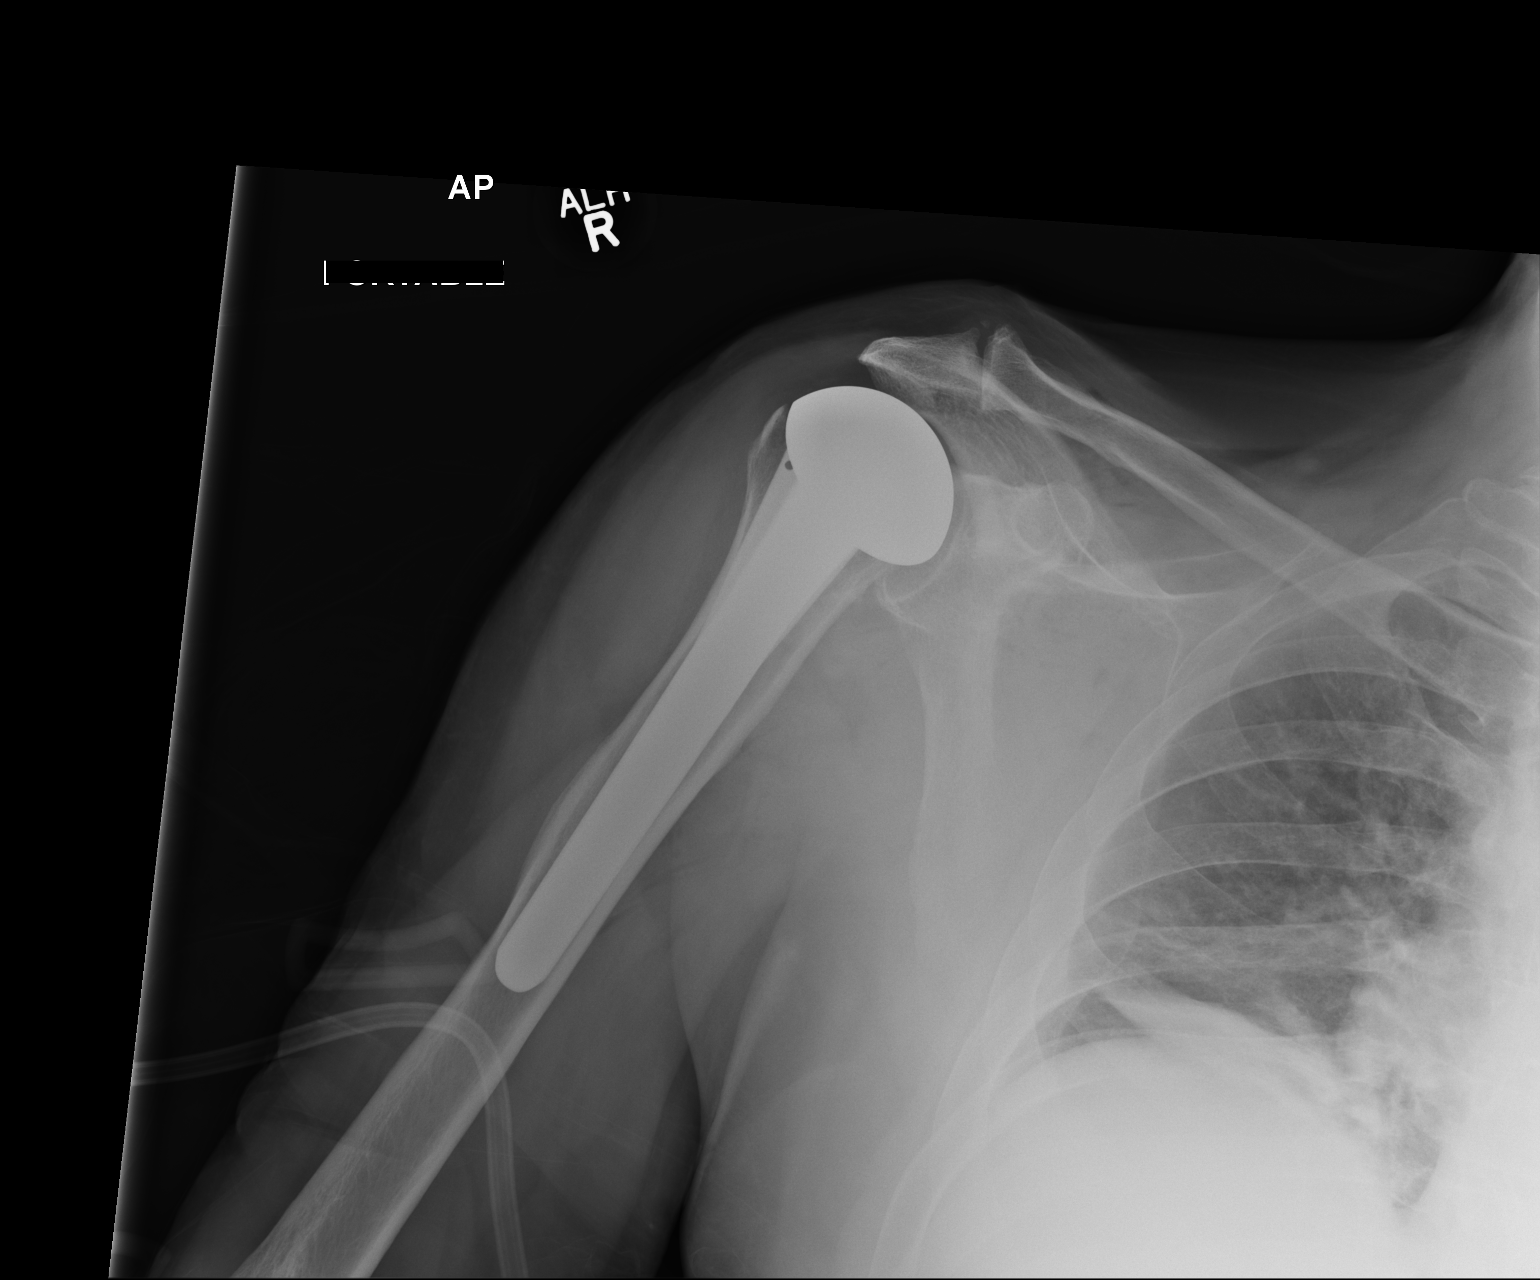

[1 of 1 positions shown; findings below may reference images not displayed]

FINDINGS: Status post right shoulder arthroplasty. No evidence for dislocation
on the frontal view provided. There is incidental note of right
lower lobe lung base atelectasis.
IMPRESSION: Status post right shoulder arthroplasty.

## 2016-11-21 ENCOUNTER — Telehealth: Payer: Self-pay | Admitting: Physician Assistant

## 2016-11-21 NOTE — Telephone Encounter (Signed)
Ok with me 

## 2016-11-21 NOTE — Telephone Encounter (Signed)
OK w me.  

## 2016-11-21 NOTE — Telephone Encounter (Signed)
Patient request to transfer care from [facility] to Dr. Nani Ravens

## 2016-12-18 ENCOUNTER — Ambulatory Visit (INDEPENDENT_AMBULATORY_CARE_PROVIDER_SITE_OTHER): Payer: Self-pay | Admitting: Family Medicine

## 2016-12-18 ENCOUNTER — Encounter: Payer: Self-pay | Admitting: Family Medicine

## 2016-12-18 VITALS — BP 111/73 | HR 70 | Temp 98.2°F | Ht 71.0 in | Wt 214.2 lb

## 2016-12-18 DIAGNOSIS — W57XXXA Bitten or stung by nonvenomous insect and other nonvenomous arthropods, initial encounter: Secondary | ICD-10-CM

## 2016-12-18 DIAGNOSIS — F329 Major depressive disorder, single episode, unspecified: Secondary | ICD-10-CM

## 2016-12-18 DIAGNOSIS — F419 Anxiety disorder, unspecified: Secondary | ICD-10-CM

## 2016-12-18 DIAGNOSIS — F17210 Nicotine dependence, cigarettes, uncomplicated: Secondary | ICD-10-CM

## 2016-12-18 DIAGNOSIS — K635 Polyp of colon: Secondary | ICD-10-CM

## 2016-12-18 DIAGNOSIS — Z122 Encounter for screening for malignant neoplasm of respiratory organs: Secondary | ICD-10-CM

## 2016-12-18 DIAGNOSIS — I10 Essential (primary) hypertension: Secondary | ICD-10-CM

## 2016-12-18 DIAGNOSIS — R5383 Other fatigue: Secondary | ICD-10-CM

## 2016-12-18 DIAGNOSIS — E785 Hyperlipidemia, unspecified: Secondary | ICD-10-CM

## 2016-12-18 DIAGNOSIS — R0683 Snoring: Secondary | ICD-10-CM

## 2016-12-18 MED ORDER — DOXYCYCLINE HYCLATE 100 MG PO TABS: 200.0000 mg | ORAL_TABLET | Freq: Once | ORAL | 0 refills | Status: AC

## 2016-12-18 MED ORDER — MELOXICAM 15 MG PO TABS: 15.0000 mg | ORAL_TABLET | Freq: Every day | ORAL | 0 refills | Status: DC

## 2016-12-18 MED ORDER — PRAVASTATIN SODIUM 40 MG PO TABS: 40.0000 mg | ORAL_TABLET | Freq: Every day | ORAL | 5 refills | Status: DC

## 2016-12-18 MED ORDER — LOSARTAN POTASSIUM 50 MG PO TABS: 50.0000 mg | ORAL_TABLET | Freq: Every day | ORAL | 5 refills | Status: DC

## 2016-12-18 MED ORDER — SERTRALINE HCL 100 MG PO TABS: 100.0000 mg | ORAL_TABLET | Freq: Every day | ORAL | 5 refills | Status: DC

## 2016-12-18 NOTE — Progress Notes (Signed)
Chief Complaint  Patient presents with  . Transitions Of Care    tick bite on the back of (L)calf -last Thurs or Fri  . Medication Refill    Subjective: Patient is a 61 y.o. male here for Transition of care.  Patient had a tick bite 3 days ago that was removed by his wife. He initially had redness and inflammation that is now resolving. No drainage or current pain. He does have a history of Lyme disease and that did show up as a target like lesion.  The patient has a 42-pack-year smoking history. He is currently smoking half pack a day. He has never had a low-dose chest CT. He is in the pre-contemplative phase for quitting.  The patient has a history of colon polyps, 26 were found on his last colonoscopy. He was supposed to follow-up around 2016, however he lost insurance and never did this. He needs a colonoscopy.  The patient has been using Valium to help him sleep, however feels this is no longer working. He does snore at night and wakes up feeling like he has not rested at all. He has never been evaluated for sleep apnea. He does not nap during the day. No routine alcohol or caffeine consumption before bed. No witnessed apneic episodes.  ROS: Heart: Denies chest pain  Lungs: Denies SOB   Family History  Problem Relation Age of Onset  . Atrial fibrillation Mother        Alive at 52  . COPD Mother   . Hypertension Mother   . Hypertension Father        Alive at 69  . Lung cancer Father        Chronic smoker  . Factor V Leiden deficiency Father        Also Lupus anticoagulant  . Heart attack Maternal Grandmother 48  . Heart failure Paternal Grandmother   . Diabetes Paternal Grandmother   . Heart attack Brother 57       X2  . Heart attack Sister 55       Deceased  . Healthy Sister        x2  . Healthy Son        x2  . Colon cancer Neg Hx   . Esophageal cancer Neg Hx   . Rectal cancer Neg Hx   . Stomach cancer Neg Hx    Past Medical History:  Diagnosis Date  .  Anginal pain ([place])   . Anxiety   . Arthritis   . Coronary artery disease, non-occlusive 08/2013   Mild to moderate (40% OM1) single vessel CAD. Otherwise nonobstructed.  . Depression   . Dyslipidemia, goal LDL below 100   . Essential hypertension   . Family history of premature CAD   . GERD (gastroesophageal reflux disease)   . H/O skin disorder    Involving hands. Subsequently resolved.   . Nonischemic cardiomyopathy ([place]) 05/2013   Non-ischemic: EF ~45% by Echo & Myoview --> Non-obstructive CAD  . Obesity (BMI 30.0-34.9)    Allergies  Allergen Reactions  . Isosorbide Nitrate Other (See Comments)    CONTINUOUS HEADACHE   . Oxycodone Itching    Reaction was to straight oxycodone 15 mg tablets - no reaction to percocet  . Penicillins Hives and Rash    Has patient had a PCN reaction causing immediate rash, facial/tongue/throat swelling, SOB or lightheadedness with hypotension: Yes Has patient had a PCN reaction causing severe rash involving mucus membranes or skin necrosis: No  Has patient had a PCN reaction that required hospitalization No Has patient had a PCN reaction occurring within the last 10 years: No If all of the above answers are "NO", then may proceed with Cephalosporin use.    Current Outpatient Prescriptions:  .  acetaminophen (TYLENOL) 650 MG CR tablet, Take 650 mg by mouth every 8 (eight) hours as needed for pain., Disp: , Rfl:  .  aspirin EC 81 MG tablet, Take 81 mg by mouth daily., Disp: , Rfl:  .  losartan (COZAAR) 50 MG tablet, Take 1 tablet (50 mg total) by mouth daily., Disp: 30 tablet, Rfl: 5 .  meloxicam (MOBIC) 15 MG tablet, Take 1 tablet (15 mg total) by mouth daily., Disp: 30 tablet, Rfl: 0 .  methocarbamol (ROBAXIN) 500 MG tablet, Take 500 mg by mouth every 6 (six) hours as needed for muscle spasms., Disp: , Rfl:  .  nitroGLYCERIN (NITROSTAT) 0.4 MG SL tablet, Place 1 tablet (0.4 mg total) under the tongue every 5 (five) minutes as needed for chest pain.,  Disp: 30 tablet, Rfl: 1 .  omeprazole (PRILOSEC OTC) 20 MG tablet, Take 20 mg by mouth daily., Disp: , Rfl:  .  pravastatin (PRAVACHOL) 40 MG tablet, Take 1 tablet (40 mg total) by mouth daily., Disp: 30 tablet, Rfl: 5 .  senna-docusate (SENOKOT-S) 8.6-50 MG tablet, Take 1 tablet by mouth daily., Disp: , Rfl:  .  sertraline (ZOLOFT) 100 MG tablet, Take 1 tablet (100 mg total) by mouth daily., Disp: 30 tablet, Rfl: 5 .  doxycycline (VIBRA-TABS) 100 MG tablet, Take 2 tablets (200 mg total) by mouth once., Disp: 2 tablet, Rfl: 0  Objective: BP 111/73 (BP Location: Left Arm, Patient Position: Sitting, Cuff Size: Normal)   Pulse 70   Temp 98.2 F (36.8 C) (Oral)   Ht 5\' 11"  (1.803 m)   Wt 214 lb 3.2 oz (97.2 kg)   SpO2 96%   BMI 29.87 kg/m  General: Awake, appears stated age HEENT: MMM, EOMi Heart: RRR, no murmurs, no lower extremity edema Lungs: CTAB, no rales, wheezes or rhonchi. No accessory muscle use Skin: Roughly circular area, approximately 1 cm in diameter, of excoriation on the proximal posterior left lower extremity. There is no erythema, target lesion, fluctuance, drainage, or tenderness to palpation. Psych: Age appropriate judgment and insight, normal affect and mood  Assessment and Plan: Tick bite, initial encounter - Plan: doxycycline (VIBRA-TABS) 100 MG tablet  Anxiety and depression - Plan: sertraline (ZOLOFT) 100 MG tablet  Hyperlipidemia, unspecified hyperlipidemia type - Plan: pravastatin (PRAVACHOL) 40 MG tablet  Essential hypertension, benign - Plan: losartan (COZAAR) 50 MG tablet  Snoring - Plan: Ambulatory referral to Pulmonology  Fatigue, unspecified type - Plan: Ambulatory referral to Pulmonology  Encounter for screening for lung cancer - Plan: CT CHEST LUNG CANCER SCREENING LOW DOSE WO CONTRAST  Smokes with greater than 30 pack year history - Plan: CT CHEST LUNG CANCER SCREENING LOW DOSE WO CONTRAST  Polyp of colon, unspecified part of colon, unspecified  type - Plan: Ambulatory referral to Gastroenterology  Orders as above. Counseled on tobacco cessation. I do not want him on any hypnotics/sedatives prior to being evaluated for sleep apnea. F/u with me in 6 mo, sooner if OSA eval unremarkable. The patient voiced understanding and agreement to the plan.  Banner Elk, DO 12/18/16  3:41 PM

## 2016-12-18 NOTE — Patient Instructions (Addendum)
If you do not hear anything about your referral in the next 1-2 weeks, call our office and ask for an update.  Claritin (loratadine), Allegra (fexofenadine), Zyrtec (cetirizine); these are listed in order from weakest to strongest. Generic, and therefore cheaper, options are in the parentheses.   There are available OTC, and the generic versions, which may be cheaper, are in parentheses. Show this to a pharmacist if you have trouble finding any of these items.  Stop smoking.

## 2016-12-21 ENCOUNTER — Telehealth: Payer: Self-pay | Admitting: *Deleted

## 2016-12-21 MED ORDER — AZITHROMYCIN 500 MG PO TABS: ORAL_TABLET | ORAL | 0 refills | Status: DC

## 2016-12-21 NOTE — Telephone Encounter (Signed)
Received completed and signed Medical Clearance for Dental Treatment from Dr. Nani Ravens.  Faxed to [facility].  Confirmation received.  Azithromycin 500mg  sent to the pharmacy by e-script.  Called and spoke with the pt's wife and informed her that the form was completed and prescription sent to the pharmacy.  She verbalized understanding and agreed. //AB/CMA

## 2016-12-29 ENCOUNTER — Ambulatory Visit (INDEPENDENT_AMBULATORY_CARE_PROVIDER_SITE_OTHER): Payer: Medicaid Other | Admitting: Gastroenterology

## 2016-12-29 ENCOUNTER — Encounter: Payer: Self-pay | Admitting: Gastroenterology

## 2016-12-29 VITALS — BP 104/72 | HR 65 | Ht 71.0 in | Wt 215.0 lb

## 2016-12-29 DIAGNOSIS — Z86010 Personal history of colonic polyps: Secondary | ICD-10-CM | POA: Diagnosis not present

## 2016-12-29 MED ORDER — NA SULFATE-K SULFATE-MG SULF 17.5-3.13-1.6 GM/177ML PO SOLN: 1.0000 | ORAL | 0 refills | Status: DC

## 2016-12-29 NOTE — Patient Instructions (Signed)

## 2016-12-29 NOTE — Progress Notes (Addendum)
12/29/2016 Charles Grimes [id number] 08/01/1955   HISTORY OF PRESENT ILLNESS:  This is a pleasant 61 year old male who is known to Dr. Hilarie Fredrickson.  He is due for a surveillance colonoscopy.  Last colonoscopy was in 04/2014 at which time he had over 20 polyps removed, most of which were tubular adenomas.  He was due for a colonoscopy recall in 04/2015 but lost his insurance so did not come in at that time.  He now has new insurance so is here today to schedule.  He denies any GI complaints.   Past Medical History:  Diagnosis Date  . Anginal pain ([place])   . Anxiety   . Arthritis   . Coronary artery disease, non-occlusive 08/2013   Mild to moderate (40% OM1) single vessel CAD. Otherwise nonobstructed.  . Depression   . Dyslipidemia, goal LDL below 100   . Essential hypertension   . Family history of premature CAD   . GERD (gastroesophageal reflux disease)   . H/O skin disorder    Involving hands. Subsequently resolved.   . Nonischemic cardiomyopathy ([place]) 05/2013   Non-ischemic: EF ~45% by Echo & Myoview --> Non-obstructive CAD  . Obesity (BMI 30.0-34.9)    Past Surgical History:  Procedure Laterality Date  . LEFT HEART CATHETERIZATION WITH CORONARY ANGIOGRAM  08/26/2013   Mild to moderate disease with 40-50% stenosis and OM1. Otherwise no significant CAD --  Surgeon: Leonie Man, MD;  Location: [facility] [place] CATH LAB;  Service: Cardiovascular;;  . Lower extremity arterial Dopplers  06/11/2013   No occlusive disease  . LUMBAR LAMINECTOMY/DECOMPRESSION MICRODISCECTOMY Right 10/07/2014   Procedure: LUMBAR LAMINECTOMY/DECOMPRESSION MICRODISCECTOMY 1 LEVEL;  Surgeon: Kary Kos, MD;  Location: [facility] NEURO ORS;  Service: Neurosurgery;  Laterality: Right;  Right L3-L4 Microdiscectomy  . NM MYOVIEW LTD  06/03/2013   Low risk study with no ischemia. EF roughly 44% with no regional wall motion abnormalities noted  . PFTs  06/2013   Normal Volumes &  Spirometry; Moderately reduced DLCO  . SHOULDER  HEMI-ARTHROPLASTY Right 05/28/2015   Procedure: RIGHT SHOULDER HEMI-ARTHROPLASTY CTA HEAD AND SUBSCAP REPAIR ;  Surgeon: Netta Cedars, MD;  Location: [facility];  Service: Orthopedics;  Laterality: Right;  . TRANSTHORACIC ECHOCARDIOGRAM  06/11/2013   Mildly reduced EF: 45-50%. Mild anterior hypokinesis with incoordinate septal motion. No evidence of pulmonary hypertension    reports that he has been smoking Cigarettes.  He has a 42.00 pack-year smoking history. He has never used smokeless tobacco. He reports that he drinks alcohol. He reports that he does not use drugs. family history includes Atrial fibrillation in his mother; COPD in his mother; Colonic polyp in his mother; Diabetes in his paternal grandmother; Factor V Leiden deficiency in his father; Healthy in his sister and son; Heart attack (age of onset: 53) in his maternal grandmother; Heart attack (age of onset: 71) in his sister; Heart attack (age of onset: 98) in his brother; Heart failure in his paternal grandmother; Hypertension in his father and mother; Lung cancer in his father. Allergies  Allergen Reactions  . Isosorbide Nitrate Other (See Comments)    CONTINUOUS HEADACHE   . Oxycodone Itching    Reaction was to straight oxycodone 15 mg tablets - no reaction to percocet  . Penicillins Hives and Rash    Has patient had a PCN reaction causing immediate rash, facial/tongue/throat swelling, SOB or lightheadedness with hypotension: Yes Has patient had a PCN reaction causing severe rash involving mucus membranes or skin necrosis:  No Has patient had a PCN reaction that required hospitalization No Has patient had a PCN reaction occurring within the last 10 years: No If all of the above answers are "NO", then may proceed with Cephalosporin use.      Outpatient Encounter Prescriptions as of 12/29/2016  Medication Sig  . acetaminophen (TYLENOL) 650 MG CR tablet Take 650 mg by mouth every 8 (eight) hours as needed for pain.  Marland Kitchen aspirin EC 81  MG tablet Take 81 mg by mouth daily.  Marland Kitchen azithromycin (ZITHROMAX) 500 MG tablet Take 1 dose 1 hour before dental procedure.  Marland Kitchen losartan (COZAAR) 50 MG tablet Take 1 tablet (50 mg total) by mouth daily.  . meloxicam (MOBIC) 15 MG tablet Take 1 tablet (15 mg total) by mouth daily.  . methocarbamol (ROBAXIN) 500 MG tablet Take 500 mg by mouth every 6 (six) hours as needed for muscle spasms.  . nitroGLYCERIN (NITROSTAT) 0.4 MG SL tablet Place 1 tablet (0.4 mg total) under the tongue every 5 (five) minutes as needed for chest pain.  Marland Kitchen omeprazole (PRILOSEC OTC) 20 MG tablet Take 20 mg by mouth daily.  . pravastatin (PRAVACHOL) 40 MG tablet Take 1 tablet (40 mg total) by mouth daily.  Marland Kitchen senna-docusate (SENOKOT-S) 8.6-50 MG tablet Take 1 tablet by mouth daily.  . sertraline (ZOLOFT) 100 MG tablet Take 1 tablet (100 mg total) by mouth daily.   No facility-administered encounter medications on file as of 12/29/2016.      REVIEW OF SYSTEMS  : All other systems reviewed and negative except where noted in the History of Present Illness.   PHYSICAL EXAM: BP 104/72   Pulse 65   Ht 5\' 11"  (1.803 m)   Wt 215 lb (97.5 kg)   BMI 29.99 kg/m  General: Well developed white male in no acute distress Head: Normocephalic and atraumatic Eyes:  Sclerae anicteric, conjunctiva pink. Ears: Normal auditory acuity Lungs: Clear throughout to auscultation; no increased WOB. Heart: Regular rate and rhythm Abdomen: Soft, non-distended.  Normal bowel sounds.  Non-tender. Rectal:  Will be done at the time of colonoscopy. Musculoskeletal: Symmetrical with no gross deformities  Skin: No lesions on visible extremities Extremities: No edema  Neurological: Alert oriented x 4, grossly non-focal Psychological:  Alert and cooperative. Normal mood and affect  ASSESSMENT AND PLAN: -Personal history of colon polyps:  Multiple tubular adenomas removed in 04/2014.  Will schedule with Dr. Hilarie Fredrickson.  The risks, benefits, and  alternatives to colonoscopy were discussed with the patient and he consents to proceed.   CC:  Brunetta Jeans, PA-C   Addendum: Reviewed and agree with initial management. Pyrtle, Lajuan Lines, MD

## 2017-01-02 ENCOUNTER — Ambulatory Visit (AMBULATORY_SURGERY_CENTER): Payer: Medicaid Other | Admitting: Internal Medicine

## 2017-01-02 ENCOUNTER — Encounter: Payer: Self-pay | Admitting: Internal Medicine

## 2017-01-02 VITALS — BP 154/82 | HR 53 | Temp 98.2°F | Resp 12 | Ht 71.0 in | Wt 215.0 lb

## 2017-01-02 DIAGNOSIS — K635 Polyp of colon: Secondary | ICD-10-CM | POA: Diagnosis not present

## 2017-01-02 DIAGNOSIS — D123 Benign neoplasm of transverse colon: Secondary | ICD-10-CM

## 2017-01-02 DIAGNOSIS — Z86010 Personal history of colonic polyps: Secondary | ICD-10-CM | POA: Diagnosis not present

## 2017-01-02 DIAGNOSIS — D125 Benign neoplasm of sigmoid colon: Secondary | ICD-10-CM

## 2017-01-02 HISTORY — PX: COLONOSCOPY WITH PROPOFOL: SHX5780

## 2017-01-02 MED ORDER — SODIUM CHLORIDE 0.9 % IV SOLN: 500.0000 mL | INTRAVENOUS | Status: DC

## 2017-01-02 NOTE — Progress Notes (Signed)
Alert and oriented x3, pleased with MAC, report to RN Vinnie Level

## 2017-01-02 NOTE — Progress Notes (Signed)
Called to room to assist during endoscopic procedure.  Patient ID and intended procedure confirmed with present staff. Received instructions for my participation in the procedure from the performing physician.  

## 2017-01-02 NOTE — Progress Notes (Signed)
Pt's states no medical or surgical changes since previsit or office visit. 

## 2017-01-02 NOTE — Op Note (Signed)
[facility] Patient Name: Charles Grimes Procedure Date: 01/02/2017 8:59 AM MRN: [id number] Endoscopist: Jerene Bears , MD Age: 61 Referring MD:  Date of Birth: 06-10-1956 Gender: Male Account #: [id number] Procedure:                Colonoscopy Indications:              Surveillance: Personal history of adenomatous                            polyps on last colonoscopy 3 years ago Medicines:                Monitored Anesthesia Care Procedure:                Pre-Anesthesia Assessment:                           - Prior to the procedure, a History and Physical                            was performed, and patient medications and                            allergies were reviewed. The patient's tolerance of                            previous anesthesia was also reviewed. The risks                            and benefits of the procedure and the sedation                            options and risks were discussed with the patient.                            All questions were answered, and informed consent                            was obtained. Prior Anticoagulants: The patient has                            taken no previous anticoagulant or antiplatelet                            agents. ASA Grade Assessment: III - A patient with                            severe systemic disease. After reviewing the risks                            and benefits, the patient was deemed in                            satisfactory condition to undergo the procedure.  After obtaining informed consent, the colonoscope                            was passed under direct vision. Throughout the                            procedure, the patient's blood pressure, pulse, and                            oxygen saturations were monitored continuously. The                            Colonoscope was introduced through the anus and                            advanced to the the cecum,  identified by                            appendiceal orifice and ileocecal valve. The                            colonoscopy was performed without difficulty. The                            patient tolerated the procedure well. The quality                            of the bowel preparation was good. The ileocecal                            valve, appendiceal orifice, and rectum were                            photographed. Scope In: 9:07:28 AM Scope Out: 9:26:51 AM Scope Withdrawal Time: 0 hours 16 minutes 34 seconds  Total Procedure Duration: 0 hours 19 minutes 23 seconds  Findings:                 The digital rectal exam was normal.                           Four sessile polyps were found in the transverse                            colon. The polyps were 3 to 5 mm in size. These                            polyps were removed with a cold snare. Resection                            and retrieval were complete.                           Three sessile polyps were found in the sigmoid  colon. The polyps were 2 to 3 mm in size. These                            polyps were removed with a cold snare. Resection                            and retrieval were complete.                           Internal hemorrhoids were found during                            retroflexion. The hemorrhoids were small. Complications:            No immediate complications. Estimated Blood Loss:     Estimated blood loss was minimal. Impression:               - Four 3 to 5 mm polyps in the transverse colon,                            removed with a cold snare. Resected and retrieved.                           - Three 2 to 3 mm polyps in the sigmoid colon,                            removed with a cold snare. Resected and retrieved.                           - Internal hemorrhoids. Recommendation:           - Patient has a contact number available for                            emergencies.  The signs and symptoms of potential                            delayed complications were discussed with the                            patient. Return to normal activities tomorrow.                            Written discharge instructions were provided to the                            patient.                           - Resume previous diet.                           - Continue present medications.                           - Await pathology  results.                           - Repeat colonoscopy is recommended for                            surveillance. The colonoscopy date will be                            determined after pathology results from today's                            exam become available for review. Jerene Bears, MD 01/02/2017 9:30:04 AM This report has been signed electronically.

## 2017-01-02 NOTE — Patient Instructions (Signed)
YOU HAD AN ENDOSCOPIC PROCEDURE TODAY AT [facility]:   Refer to the procedure report that was given to you for any specific questions about what was found during the examination.  If the procedure report does not answer your questions, please call your gastroenterologist to clarify.  If you requested that your care partner not be given the details of your procedure findings, then the procedure report has been included in a sealed envelope for you to review at your convenience later.  YOU SHOULD EXPECT: Some feelings of bloating in the abdomen. Passage of more gas than usual.  Walking can help get rid of the air that was put into your GI tract during the procedure and reduce the bloating. If you had a lower endoscopy (such as a colonoscopy or flexible sigmoidoscopy) you may notice spotting of blood in your stool or on the toilet paper. If you underwent a bowel prep for your procedure, you may not have a normal bowel movement for a few days.  Please Note:  You might notice some irritation and congestion in your nose or some drainage.  This is from the oxygen used during your procedure.  There is no need for concern and it should clear up in a day or so.  SYMPTOMS TO REPORT IMMEDIATELY:   Following lower endoscopy (colonoscopy or flexible sigmoidoscopy):  Excessive amounts of blood in the stool  Significant tenderness or worsening of abdominal pains  Swelling of the abdomen that is new, acute  Fever of 100F or higher   For urgent or emergent issues, a gastroenterologist can be reached at any hour by calling [phone].   DIET:  We do recommend a small meal at first, but then you may proceed to your regular diet.  Drink plenty of fluids but you should avoid alcoholic beverages for 24 hours.  ACTIVITY:  You should plan to take it easy for the rest of today and you should NOT DRIVE or use heavy machinery until tomorrow (because of the sedation medicines used during the test).     FOLLOW UP: Our staff will call the number listed on your records the next business day following your procedure to check on you and address any questions or concerns that you may have regarding the information given to you following your procedure. If we do not reach you, we will leave a message.  However, if you are feeling well and you are not experiencing any problems, there is no need to return our call.  We will assume that you have returned to your regular daily activities without incident.  If any biopsies were taken you will be contacted by phone or by letter within the next 1-3 weeks.  Please call us at [phone] if you have not heard about the biopsies in 3 weeks.    SIGNATURES/CONFIDENTIALITY: You and/or your care partner have signed paperwork which will be entered into your electronic medical record.  These signatures attest to the fact that that the information above on your After Visit Summary has been reviewed and is understood.  Full responsibility of the confidentiality of this discharge information lies with you and/or your care-partner.  We will probably se you again in 3 years. We will send you a letter with confirmation.

## 2017-01-03 ENCOUNTER — Telehealth: Payer: Self-pay | Admitting: *Deleted

## 2017-01-03 NOTE — Telephone Encounter (Signed)
  Follow up Call-  Call back number 01/02/2017 04/29/2014  Post procedure Call Back phone  # [phone]  Permission to leave phone message Yes Yes  Some recent data might be hidden     Patient questions:  Do you have a fever, pain , or abdominal swelling? No. Pain Score  0 *  Have you tolerated food without any problems? Yes.    Have you been able to return to your normal activities? Yes.    Do you have any questions about your discharge instructions: Diet   No. Medications  No. Follow up visit  No.  Do you have questions or concerns about your Care? No.  Actions: * If pain score is 4 or above: No action needed, pain <4.

## 2017-01-03 NOTE — Telephone Encounter (Signed)
Left message and will attempt to call back this afternoon. Sm

## 2017-01-09 ENCOUNTER — Encounter: Payer: Self-pay | Admitting: Internal Medicine

## 2017-01-15 ENCOUNTER — Other Ambulatory Visit: Payer: Self-pay | Admitting: Family Medicine

## 2017-01-21 ENCOUNTER — Other Ambulatory Visit: Payer: Self-pay | Admitting: Family Medicine

## 2017-01-22 NOTE — Telephone Encounter (Signed)
Requesting Azithromycin 500mg -Take 1 tablet by mouth one hour before dental procedure.                    Meloxicam 15mg -Take 1 tablet by mouth once daily. Last refill:12/21/16;#2,0                 12/18/16;#30,0 Last OV:12/18/16-Next appt:06/20/17 Please advise.//AB/CMA

## 2017-02-12 ENCOUNTER — Other Ambulatory Visit: Payer: Self-pay | Admitting: *Deleted

## 2017-02-12 DIAGNOSIS — I10 Essential (primary) hypertension: Secondary | ICD-10-CM

## 2017-02-12 MED ORDER — NITROGLYCERIN 0.4 MG SL SUBL: 0.4000 mg | SUBLINGUAL_TABLET | SUBLINGUAL | 1 refills | Status: DC | PRN

## 2017-02-12 NOTE — Telephone Encounter (Signed)
ERX'ed Nitro to [employer] for #30/thx dmf, RMA/AB/CMA

## 2017-04-10 ENCOUNTER — Encounter: Payer: Self-pay | Admitting: Pulmonary Disease

## 2017-04-10 ENCOUNTER — Ambulatory Visit (INDEPENDENT_AMBULATORY_CARE_PROVIDER_SITE_OTHER): Payer: Medicaid Other | Admitting: Pulmonary Disease

## 2017-04-10 VITALS — BP 114/76 | HR 69 | Ht 71.0 in | Wt 210.0 lb

## 2017-04-10 DIAGNOSIS — R29818 Other symptoms and signs involving the nervous system: Secondary | ICD-10-CM

## 2017-04-10 DIAGNOSIS — Z720 Tobacco use: Secondary | ICD-10-CM | POA: Diagnosis not present

## 2017-04-10 DIAGNOSIS — J432 Centrilobular emphysema: Secondary | ICD-10-CM

## 2017-04-10 NOTE — Patient Instructions (Signed)
Will arrange for in sleep study Will call to arrange for follow up after sleep study reviewed

## 2017-04-10 NOTE — Progress Notes (Signed)
Past Surgical History He  has a past surgical history that includes NM MYOVIEW LTD (06/03/2013); transthoracic echocardiogram (06/11/2013); Lower extremity arterial Dopplers (06/11/2013); PFTs (06/2013); left heart catheterization with coronary angiogram (08/26/2013); Lumbar laminectomy/decompression microdiscectomy (Right, 10/07/2014); and Shoulder hemi-arthroplasty (Right, 05/28/2015).  Allergies  Allergen Reactions  . Isosorbide Nitrate Other (See Comments)    CONTINUOUS HEADACHE   . Oxycodone Itching    Reaction was to straight oxycodone 15 mg tablets - no reaction to percocet  . Penicillins Hives and Rash    Has patient had a PCN reaction causing immediate rash, facial/tongue/throat swelling, SOB or lightheadedness with hypotension: Yes Has patient had a PCN reaction causing severe rash involving mucus membranes or skin necrosis: No Has patient had a PCN reaction that required hospitalization No Has patient had a PCN reaction occurring within the last 10 years: No If all of the above answers are "NO", then may proceed with Cephalosporin use.    Family History His family history includes Atrial fibrillation in his mother; COPD in his mother; Colonic polyp in his mother; Diabetes in his paternal grandmother; Factor V Leiden deficiency in his father; Healthy in his sister and son; Heart attack (age of onset: 69) in his maternal grandmother; Heart attack (age of onset: 65) in his sister; Heart attack (age of onset: 75) in his brother; Heart failure in his paternal grandmother; Hypertension in his father and mother; Lung cancer in his father.  Social History He  reports that he has been smoking Cigarettes.  He has a 42.00 pack-year smoking history. He has never used smokeless tobacco. He reports that he drinks alcohol. He reports that he does not use drugs.  Review of systems Constitutional: Negative for fever and unexpected weight change.  HENT: Positive for dental problem. Negative for  congestion, ear pain, nosebleeds, postnasal drip, rhinorrhea, sinus pressure, sneezing, sore throat and trouble swallowing.   Eyes: Negative for redness and itching.  Respiratory: Positive for cough, shortness of breath and wheezing.   Cardiovascular: Negative for palpitations and leg swelling.  Gastrointestinal: Negative for nausea and vomiting.  Genitourinary: Negative for dysuria.  Musculoskeletal: Negative for joint swelling.  Skin: Negative for rash.  Allergic/Immunologic: Negative.  Negative for environmental allergies, food allergies and immunocompromised state.  Neurological: Negative for headaches.  Hematological: Does not bruise/bleed easily.  Psychiatric/Behavioral: Negative for dysphoric mood. The patient is nervous/anxious.     Current Outpatient Prescriptions on File Prior to Visit  Medication Sig  . acetaminophen (TYLENOL) 650 MG CR tablet Take 650 mg by mouth every 8 (eight) hours as needed for pain.  Marland Kitchen aspirin EC 81 MG tablet Take 81 mg by mouth daily.  Marland Kitchen losartan (COZAAR) 50 MG tablet Take 1 tablet (50 mg total) by mouth daily.  . meloxicam (MOBIC) 15 MG tablet TAKE 1 TABLET BY MOUTH ONCE DAILY  . nitroGLYCERIN (NITROSTAT) 0.4 MG SL tablet Place 1 tablet (0.4 mg total) under the tongue every 5 (five) minutes as needed for chest pain.  Marland Kitchen omeprazole (PRILOSEC OTC) 20 MG tablet Take 20 mg by mouth daily.  . pravastatin (PRAVACHOL) 40 MG tablet Take 1 tablet (40 mg total) by mouth daily.  . sertraline (ZOLOFT) 100 MG tablet Take 1 tablet (100 mg total) by mouth daily.   Current Facility-Administered Medications on File Prior to Visit  Medication  . 0.9 %  sodium chloride infusion    Chief Complaint  Patient presents with  . Sleep Consult    Pt referred by Dr. Nani Ravens for snoring &  fatigue. Pt sleeps around 7 hours per night,  up at least 2 times per night. Pt has some wheezing with exertsion. When pt wakes up in the morning, he feels like he didn't sleep that night and  feels like he could go back to bed.    Cardiac tests Echo 06/11/13 >> EF 45 to 50%, grade 1 DD  Pulmonary tests PFT 06/23/13 >> FEV1 3.36 (87%), FEV1% 78, TLC 6.48 (90%), DLCO 56% CT chest 05/16/14 >> centrilobular emphysema  Past medical history He  has a past medical history of Anginal pain ([place]); Anxiety; Arthritis; Coronary artery disease, non-occlusive (08/2013); Depression; Dyslipidemia, goal LDL below 100; Essential hypertension; Family history of premature CAD; GERD (gastroesophageal reflux disease); H/O skin disorder; Nonischemic cardiomyopathy ([place]) (05/2013); and Obesity (BMI 30.0-34.9).  Vital signs BP 114/76 (BP Location: Left Arm, Cuff Size: Normal)   Pulse 69   Ht 5\' 11"  (1.803 m)   Wt 210 lb (95.3 kg)   SpO2 96%   BMI 29.29 kg/m   History of Present Illness Charles Grimes is a 61 y.o. male for evaluation of sleep problems.  He has used valium to help his sleep.  His PCP was worried that he could have additional problems causing his sleep trouble.  He snores, and his wife says his breathing gets shallow at night.  He has trouble sleeping on his back.  He feels more tired on the nights that he dreams more.  He wakes up at night with a cough and sometimes has a wheeze at night.  He goes to sleep at 11 pm.  He falls asleep 15 minutes.  He wakes up 1 or 2 times to use the bathroom.  He gets out of bed at 8 am.  He feels tired in the morning.  He denies morning headache.  He does not use anything to help him stay awake.  He denies sleep walking, sleep talking, bruxism, or nightmares.  There is no history of restless legs.  He denies sleep hallucinations, sleep paralysis, or cataplexy.  The Epworth score is 8 out of 24.  Physical Exam:  General - No distress ENT - No sinus tenderness, no oral exudate, no LAN, no thyromegaly, TM clear, pupils equal/reactive Cardiac - s1s2 regular, no murmur, pulses symmetric Chest - No wheeze/rales/dullness, good air entry, normal  respiratory excursion Back - No focal tenderness Abd - Soft, non-tender, no organomegaly, + bowel sounds Ext - No edema Neuro - Normal strength, cranial nerves intact Skin - No rashes Psych - Normal mood, and behavior  Discussion: He has snoring, sleep disruption, witnessed apnea, and daytime sleepiness.  He has hx of CAD, depression, combined CHF, and HTN.  I am concerned he could have sleep apnea.  We discussed how sleep apnea can affect various health problems, including risks for hypertension, cardiovascular disease, and diabetes.  We also discussed how sleep disruption can increase risks for accidents, such as while driving.  Weight loss as a means of improving sleep apnea was also reviewed.  Additional treatment options discussed were CPAP therapy, oral appliance, and surgical intervention.  He reports symptoms of insomnia, but this might actually be related to sleep apnea.  He continues to smoke and previous testing shows evidence for emphysema.  Assessment/plan:  Suspected sleep apnea. - will arrange for in lab sleep study to further assess  Tobacco abuse. - reviewed options to assist with smoking cessation - he will try quitting on his own  Centrilobular emphysema. - don't think he  needs inhaler therapy at present   Patient Instructions  Will arrange for in sleep study Will call to arrange for follow up after sleep study reviewed     Chesley Mires, M.D. Pager [phone] 04/10/2017, 2:19 PM

## 2017-04-10 NOTE — Progress Notes (Signed)
   Subjective:    Patient ID: Charles Grimes, male    DOB: 09-16-1955, 61 y.o.   MRN: [id number]  HPI    Review of Systems  Constitutional: Negative for fever and unexpected weight change.  HENT: Positive for dental problem. Negative for congestion, ear pain, nosebleeds, postnasal drip, rhinorrhea, sinus pressure, sneezing, sore throat and trouble swallowing.   Eyes: Negative for redness and itching.  Respiratory: Positive for cough, shortness of breath and wheezing.   Cardiovascular: Negative for palpitations and leg swelling.  Gastrointestinal: Negative for nausea and vomiting.  Genitourinary: Negative for dysuria.  Musculoskeletal: Negative for joint swelling.  Skin: Negative for rash.  Allergic/Immunologic: Negative.  Negative for environmental allergies, food allergies and immunocompromised state.  Neurological: Negative for headaches.  Hematological: Does not bruise/bleed easily.  Psychiatric/Behavioral: Negative for dysphoric mood. The patient is nervous/anxious.        Objective:   Physical Exam        Assessment & Plan:

## 2017-04-12 ENCOUNTER — Ambulatory Visit (HOSPITAL_BASED_OUTPATIENT_CLINIC_OR_DEPARTMENT_OTHER): Payer: Medicaid Other | Attending: Pulmonary Disease | Admitting: Pulmonary Disease

## 2017-04-12 VITALS — Ht 71.0 in | Wt 210.0 lb

## 2017-04-12 DIAGNOSIS — G4733 Obstructive sleep apnea (adult) (pediatric): Secondary | ICD-10-CM | POA: Insufficient documentation

## 2017-04-12 DIAGNOSIS — G4736 Sleep related hypoventilation in conditions classified elsewhere: Secondary | ICD-10-CM | POA: Insufficient documentation

## 2017-04-15 ENCOUNTER — Telehealth: Payer: Self-pay | Admitting: Pulmonary Disease

## 2017-04-15 DIAGNOSIS — G4733 Obstructive sleep apnea (adult) (pediatric): Secondary | ICD-10-CM

## 2017-04-15 NOTE — Telephone Encounter (Signed)
PSG 04/12/17 >> AHI 13, SpO2 low 80%   Will have my nurse inform pt that sleep study shows mild sleep apnea.  Options are 1) CPAP now, 2) ROV first.  If He is agreeable to CPAP, then please send order for auto CPAP range 5 to 15 cm H2O with heated humidity and mask of choice.  Have download sent 1 month after starting CPAP and set up ROV 2 months after starting CPAP.  ROV can be with me or NP.

## 2017-04-15 NOTE — Procedures (Signed)
    Patient Name: Charles Grimes, Charles Grimes Date: 04/12/2017   Gender: Male  D.O.B: 1956-07-08  Age (years): 18  Referring Provider: Chesley Mires MD, ABSM  Height (inches): 71  Interpreting Physician: Chesley Mires MD, ABSM  Weight (lbs): 210  RPSGT: Baxter Flattery   BMI: 29  MRN: [id number]  Neck Size: 17.00    CLINICAL INFORMATION  Sleep Study Type: NPSG  Indication for sleep study: Fatigue, OSA, Snoring, Witnessed Apneas  Epworth Sleepiness Score: 8  SLEEP STUDY TECHNIQUE  As per the AASM Manual for the Scoring of Sleep and Associated Events v2.3 (April 2016) with a hypopnea requiring 4% desaturations.  The channels recorded and monitored were frontal, central and occipital EEG, electrooculogram (EOG), submentalis EMG (chin), nasal and oral airflow, thoracic and abdominal wall motion, anterior tibialis EMG, snore microphone, electrocardiogram, and pulse oximetry.  MEDICATIONS  Medications self-administered by patient taken the night of the study : N/A  SLEEP ARCHITECTURE  The study was initiated at 10:43:20 PM and ended at 4:44:18 AM.  Sleep onset time was 4.5 minutes and the sleep efficiency was 78.0%. The total sleep time was 281.5 minutes.  Stage REM latency was 215.5 minutes.  The patient spent 7.99% of the night in stage N1 sleep, 71.76% in stage N2 sleep, 0.00% in stage N3 and 20.25% in REM.  Alpha intrusion was absent.  Supine sleep was 44.20%.  RESPIRATORY PARAMETERS  The overall apnea/hypopnea index (AHI) was 13.0 per hour. There were 33 total apneas, including 33 obstructive, 0 central and 0 mixed apneas. There were 28 hypopneas and 0 RERAs.  The AHI during Stage REM sleep was 13.7 per hour. AHI while supine was 26.0 per hour.  The mean oxygen saturation was 91.62%. The minimum SpO2 during sleep was 80.00%.  loud snoring was noted during this study. CARDIAC DATA  The 2 lead EKG demonstrated sinus rhythm. The mean heart rate was 64.69 beats per minute. Other EKG  findings include: PVCs.  LEG MOVEMENT DATA  The total PLMS were 0 with a resulting PLMS index of 0.00. Associated arousal with leg movement index was 0.0 . IMPRESSIONS  - Mild obstructive sleep apnea occurred during this study (AHI = 13.0/h).  - No significant central sleep apnea occurred during this study (CAI = 0.0/h).  - Moderate oxygen desaturation was noted during this study (Min O2 = 80.00%).  - The patient snored with loud snoring volume.  - EKG findings include PVCs.  - Clinically significant periodic limb movements did not occur during sleep. No significant associated arousals. DIAGNOSIS  - Obstructive Sleep Apnea (327.23 [G47.33 ICD-10]) - Nocturnal Hypoxemia (327.26 [G47.36 ICD-10]) - test RECOMMENDATIONS  - Additional therapies include CPAP, oral appliance, or surgical assessment. [Electronically signed] 04/15/2017 05:44 PM Chesley Mires MD, [place], American Board of Sleep Medicine  NPI: [id number]

## 2017-04-16 ENCOUNTER — Other Ambulatory Visit (HOSPITAL_BASED_OUTPATIENT_CLINIC_OR_DEPARTMENT_OTHER): Payer: Self-pay

## 2017-04-16 DIAGNOSIS — R29818 Other symptoms and signs involving the nervous system: Secondary | ICD-10-CM

## 2017-04-16 DIAGNOSIS — J432 Centrilobular emphysema: Secondary | ICD-10-CM

## 2017-04-16 NOTE — Telephone Encounter (Signed)
LVM on machine for patient to return call regarding results of sleep study this morning. Will try to call back later date

## 2017-04-17 NOTE — Telephone Encounter (Signed)
Spoke with pt's wife [name], she canceled the 04/23/17 appt today, and she will call back for appt in December with [facility]. Also put in the system a recall for her to be called for follow up appt.

## 2017-04-17 NOTE — Telephone Encounter (Signed)
Okay to cancel appointment for 04/23/17 and schedule for 2 months after getting CPAP set up.

## 2017-04-17 NOTE — Telephone Encounter (Signed)
Left a detailed message to let wife know message from VS. I advised her to call after CPAP setup for 2 months and call back if had any more questions. Her name was on her voicemail so I felt comfortable leaving the message. Nothing further is needed.

## 2017-04-17 NOTE — Telephone Encounter (Signed)
LVM for patient to return call when available. Will try to call back at later date.

## 2017-04-17 NOTE — Telephone Encounter (Signed)
Patient's wife, [name] returned call.  CB is [phone].

## 2017-04-17 NOTE — Telephone Encounter (Signed)
Spoke with wife and advised results from [facility]. She agreed to order CPAP and order placed. She wants to know if they still have to come to the appt on 10/8? Please advise.

## 2017-04-23 ENCOUNTER — Ambulatory Visit: Payer: Medicaid Other | Admitting: Pulmonary Disease

## 2017-05-16 ENCOUNTER — Telehealth: Payer: Self-pay | Admitting: Family Medicine

## 2017-05-16 ENCOUNTER — Other Ambulatory Visit: Payer: Self-pay | Admitting: Family Medicine

## 2017-05-16 NOTE — Telephone Encounter (Signed)
Pt is allergic to penicillin. Pt wife says pt has to pre medicate with antibiotic for dental procedure 05/22/17. Please call in antibiotic for him to take prior to his dental appt. Pt uses [facility] at [address].

## 2017-05-17 MED ORDER — AZITHROMYCIN 500 MG PO TABS: ORAL_TABLET | ORAL | 0 refills | Status: DC

## 2017-05-17 NOTE — Telephone Encounter (Signed)
Antibiotic sent in and patient made aware

## 2017-05-17 NOTE — Telephone Encounter (Signed)
Why does he need prophylaxis? We can call in azithromycin, take 500 mg 1 hr prior to procedure. TY.

## 2017-05-21 MED ORDER — MELOXICAM 15 MG PO TABS: 15.0000 mg | ORAL_TABLET | Freq: Every day | ORAL | 2 refills | Status: DC

## 2017-06-20 ENCOUNTER — Ambulatory Visit (INDEPENDENT_AMBULATORY_CARE_PROVIDER_SITE_OTHER): Payer: Medicaid Other | Admitting: Family Medicine

## 2017-06-20 ENCOUNTER — Encounter: Payer: Self-pay | Admitting: Family Medicine

## 2017-06-20 VITALS — BP 128/78 | HR 71 | Temp 98.0°F | Ht 71.0 in | Wt 224.1 lb

## 2017-06-20 DIAGNOSIS — F419 Anxiety disorder, unspecified: Secondary | ICD-10-CM | POA: Diagnosis not present

## 2017-06-20 DIAGNOSIS — Z23 Encounter for immunization: Secondary | ICD-10-CM

## 2017-06-20 DIAGNOSIS — F329 Major depressive disorder, single episode, unspecified: Secondary | ICD-10-CM | POA: Diagnosis not present

## 2017-06-20 DIAGNOSIS — I10 Essential (primary) hypertension: Secondary | ICD-10-CM

## 2017-06-20 DIAGNOSIS — E785 Hyperlipidemia, unspecified: Secondary | ICD-10-CM | POA: Diagnosis not present

## 2017-06-20 DIAGNOSIS — F32A Depression, unspecified: Secondary | ICD-10-CM

## 2017-06-20 MED ORDER — MELOXICAM 15 MG PO TABS
15.0000 mg | ORAL_TABLET | Freq: Every day | ORAL | 2 refills | Status: DC
Start: 2017-06-20 — End: 2017-09-03

## 2017-06-20 MED ORDER — PRAVASTATIN SODIUM 40 MG PO TABS: 40.0000 mg | ORAL_TABLET | Freq: Every day | ORAL | 5 refills | Status: DC

## 2017-06-20 MED ORDER — LOSARTAN POTASSIUM 50 MG PO TABS: 50.0000 mg | ORAL_TABLET | Freq: Every day | ORAL | 5 refills | Status: DC

## 2017-06-20 MED ORDER — SERTRALINE HCL 100 MG PO TABS: 100.0000 mg | ORAL_TABLET | Freq: Every day | ORAL | 5 refills | Status: DC

## 2017-06-20 MED ORDER — AZITHROMYCIN 500 MG PO TABS: ORAL_TABLET | ORAL | 0 refills | Status: DC

## 2017-06-20 MED ORDER — SERTRALINE HCL 50 MG PO TABS: 50.0000 mg | ORAL_TABLET | Freq: Every day | ORAL | 5 refills | Status: DC

## 2017-06-20 NOTE — Patient Instructions (Signed)
Try taking the second tab of Zoloft in the evening or add it to your morning dose.   Please consider counseling. Contact [phone] to schedule an appointment or inquire about cost/insurance coverage.  Coping skills Choose 5 that work for you:  Take a deep breath  Count to 20  Read a book  Do a puzzle  Meditate  Bake  Sing  Knit  Garden  Pray  Go outside  Call a friend  Listen to music  Take a walk  Color  Send a note  Take a bath  Watch a movie  Be alone in a quiet place  Pet an animal  Visit a friend  Journal  Exercise  Stretch   Let us know if you need anything.

## 2017-06-20 NOTE — Progress Notes (Signed)
Chief Complaint  Patient presents with  . Follow-up    Subjective Charles Grimes is a 61 y.o. male who presents for hypertension follow up. Losartan 50 mg qd He is compliant with medications. Patient has these side effects of medication: none He is sometimes adhering to a healthy diet overall. Current exercise: walking sometimes  Hyperlipidemia Patient presents for dyslipidemia follow up. Currently being treated with  and compliance with treatment thus far has been good. He denies myalgias. He is sometimes adhering to a healthy. The patient is not known to have coexisting coronary artery disease.  Anxiety Hx of anxiety on 100 mg Zoloft qd. Tolerating well, compliant. Feeling more anxious and irritated lately. Stressors include family depending on him and not taking his feelings into consideration. No si or hi. He does not follow with a counselor.   Past Medical History:  Diagnosis Date  . Anginal pain ([place])   . Anxiety   . Arthritis   . Coronary artery disease, non-occlusive 08/2013   Mild to moderate (40% OM1) single vessel CAD. Otherwise nonobstructed.  . Depression   . Dyslipidemia, goal LDL below 100   . Essential hypertension   . Family history of premature CAD   . GERD (gastroesophageal reflux disease)   . H/O skin disorder    Involving hands. Subsequently resolved.   . Nonischemic cardiomyopathy ([place]) 05/2013   Non-ischemic: EF ~45% by Echo & Myoview --> Non-obstructive CAD  . Obesity (BMI 30.0-34.9)    Family History  Problem Relation Age of Onset  . Atrial fibrillation Mother        Alive at 68  . COPD Mother   . Hypertension Mother   . Colonic polyp Mother   . Hypertension Father        Alive at 50  . Lung cancer Father        Chronic smoker  . Factor V Leiden deficiency Father        Also Lupus anticoagulant  . Heart attack Maternal Grandmother 48  . Heart failure Paternal Grandmother   . Diabetes Paternal Grandmother   . Heart attack Brother 14      X2  . Heart attack Sister 46       Deceased  . Healthy Sister        x2  . Healthy Son        x2  . Colon cancer Neg Hx   . Esophageal cancer Neg Hx   . Rectal cancer Neg Hx   . Stomach cancer Neg Hx     Medications Current Outpatient Medications on File Prior to Visit  Medication Sig Dispense Refill  . acetaminophen (TYLENOL) 650 MG CR tablet Take 650 mg by mouth every 8 (eight) hours as needed for pain.    Marland Kitchen aspirin EC 81 MG tablet Take 81 mg by mouth daily.    . nitroGLYCERIN (NITROSTAT) 0.4 MG SL tablet Place 1 tablet (0.4 mg total) under the tongue every 5 (five) minutes as needed for chest pain. 30 tablet 1  . omeprazole (PRILOSEC OTC) 20 MG tablet Take 1 tablet (20 mg total) by mouth daily. 30 tablet    Allergies Allergies  Allergen Reactions  . Isosorbide Nitrate Other (See Comments)    CONTINUOUS HEADACHE   . Oxycodone Itching    Reaction was to straight oxycodone 15 mg tablets - no reaction to percocet  . Penicillins Hives and Rash    Has patient had a PCN reaction causing immediate rash, facial/tongue/throat swelling,  SOB or lightheadedness with hypotension: Yes Has patient had a PCN reaction causing severe rash involving mucus membranes or skin necrosis: No Has patient had a PCN reaction that required hospitalization No Has patient had a PCN reaction occurring within the last 10 years: No If all of the above answers are "NO", then may proceed with Cephalosporin use.    Review of Systems Cardiovascular: no chest pain Respiratory:  no shortness of breath  Exam BP 128/78 (BP Location: Left Arm, Patient Position: Sitting, Cuff Size: Large)   Pulse 71   Temp 98 F (36.7 C) (Oral)   Ht 5\' 11"  (1.803 m)   Wt 224 lb 2 oz (101.7 kg)   SpO2 95%   BMI 31.26 kg/m  General:  well developed, well nourished, in no apparent distress Skin: warm, no pallor or diaphoresis Eyes: pupils equal and round, sclera anicteric without injection Heart: RRR, no bruits, no LE  edema Lungs: clear to auscultation, no accessory muscle use Psych: well oriented with normal range of affect and appropriate judgment/insight  Anxiety and depression - Plan: sertraline (ZOLOFT) 100 MG tablet  Hyperlipidemia, unspecified hyperlipidemia type - Plan: pravastatin (PRAVACHOL) 40 MG tablet  Essential hypertension, benign - Plan: losartan (COZAAR) 50 MG tablet  Need for influenza vaccination - Plan: Flu Vaccine QUAD 6+ mos PF IM (Fluarix Quad PF)  Orders as above. Increase dose of zoloft by adding nighttime 50 mg dose. OK to take with morning dose as alt.number for counseling given. Counseled on diet and exercise F/u in 1 mo for CPE. The patient voiced understanding and agreement to the plan.  [place], DO 06/20/17  3:27 PM

## 2017-06-20 NOTE — Progress Notes (Signed)
Pre visit review using our clinic review tool, if applicable. No additional management support is needed unless otherwise documented below in the visit note. 

## 2017-06-21 ENCOUNTER — Encounter: Payer: Self-pay | Admitting: Pulmonary Disease

## 2017-06-21 DIAGNOSIS — G4733 Obstructive sleep apnea (adult) (pediatric): Secondary | ICD-10-CM

## 2017-06-21 HISTORY — DX: Obstructive sleep apnea (adult) (pediatric): G47.33

## 2017-06-22 ENCOUNTER — Encounter: Payer: Self-pay | Admitting: Pulmonary Disease

## 2017-06-22 ENCOUNTER — Ambulatory Visit: Payer: Medicaid Other | Admitting: Pulmonary Disease

## 2017-06-22 VITALS — BP 126/80 | HR 74 | Ht 71.0 in | Wt 222.4 lb

## 2017-06-22 DIAGNOSIS — G4733 Obstructive sleep apnea (adult) (pediatric): Secondary | ICD-10-CM | POA: Diagnosis not present

## 2017-06-22 DIAGNOSIS — Z716 Tobacco abuse counseling: Secondary | ICD-10-CM | POA: Diagnosis not present

## 2017-06-22 DIAGNOSIS — Z9989 Dependence on other enabling machines and devices: Secondary | ICD-10-CM

## 2017-06-22 NOTE — Progress Notes (Signed)
Current Outpatient Medications on File Prior to Visit  Medication Sig  . acetaminophen (TYLENOL) 650 MG CR tablet Take 650 mg by mouth every 8 (eight) hours as needed for pain.  Marland Kitchen aspirin EC 81 MG tablet Take 81 mg by mouth daily.  Marland Kitchen azithromycin (ZITHROMAX) 500 MG tablet Take 1 hour prior to dental procedure.  Marland Kitchen losartan (COZAAR) 50 MG tablet Take 1 tablet (50 mg total) by mouth daily.  . meloxicam (MOBIC) 15 MG tablet Take 1 tablet (15 mg total) by mouth daily.  . nitroGLYCERIN (NITROSTAT) 0.4 MG SL tablet Place 1 tablet (0.4 mg total) under the tongue every 5 (five) minutes as needed for chest pain.  Marland Kitchen omeprazole (PRILOSEC OTC) 20 MG tablet Take 1 tablet (20 mg total) by mouth daily.  . pravastatin (PRAVACHOL) 40 MG tablet Take 1 tablet (40 mg total) by mouth daily.  . sertraline (ZOLOFT) 100 MG tablet Take 1 tablet (100 mg total) by mouth daily.  . sertraline (ZOLOFT) 50 MG tablet Take 1 tablet (50 mg total) by mouth daily.   Current Facility-Administered Medications on File Prior to Visit  Medication  . 0.9 %  sodium chloride infusion     Chief Complaint  Patient presents with  . Follow-up    Pt here for 2 mo follow up with new cpap machine set up. Pt is doing well with cpap machine, taking a while to fall asleep.     Sleep tests PSG 04/12/17 >> AHI 13, SpO2 low 80% Auto CPAP 05/22/17 to 06/20/17 >> used on 30 of 30 nights with average 7 hrs 14 min.  Average AHI 0.8 with median CPAP 9 and 95 th percentile CPAP 12 cm H2O  Past medical history Anxiety, CAD, Depression, HLD, HtN, GERD, non ischemic CM  Past surgical history, Family history, Social history, Allergies all reviewed.  Vital Signs BP 126/80 (BP Location: Left Arm, Cuff Size: Normal)   Pulse 74   Ht 5\' 11"  (1.803 m)   Wt 222 lb 6.4 oz (100.9 kg)   SpO2 97%   BMI 31.02 kg/m   History of Present Illness Charles Grimes is a 61 y.o. male with obstructive sleep apnea.  He had sleep study in September.  This  showed mild sleep apnea.  He was started on CPAP.  He is sleeping better, and no longer snoring.  He feels more rested.  No issues with mask fit.  He has cut down his cigarettes.  He wants to try quitting on his own.  He is nervous about using medications to help quit.  Physical Exam  General - pleasant Eyes - pupils reactive, wears glasses ENT - no sinus tenderness, no oral exudate, no LAN, wears dentures Cardiac - regular, no murmur Chest - no wheeze, rales Abd - soft, non tender Ext - no edema Skin - no rashes Neuro - normal strength Psych - normal mood   Assessment/Plan  Obstructive sleep apnea. - he is compliant with CPAP and reports benefit from therapy - continue auto CPAP  Tobacco abuse. - discussed options to help with smoking cessation - he would like to try quitting on his own - he will call or email if he needs help with smoking cessation   Patient Instructions  Call if you want some help getting off cigarettes  Follow up in 1 year   Chesley Mires, MD [place] Pager:  [phone] 06/22/2017, 4:42 PM

## 2017-06-22 NOTE — Patient Instructions (Signed)
Call if you want some help getting off cigarettes  Follow up in 1 year

## 2017-07-13 ENCOUNTER — Encounter: Payer: Self-pay | Admitting: Pulmonary Disease

## 2017-09-03 ENCOUNTER — Ambulatory Visit (INDEPENDENT_AMBULATORY_CARE_PROVIDER_SITE_OTHER): Payer: Medicaid Other | Admitting: Family Medicine

## 2017-09-03 ENCOUNTER — Encounter: Payer: Self-pay | Admitting: Family Medicine

## 2017-09-03 ENCOUNTER — Telehealth: Payer: Self-pay | Admitting: Family Medicine

## 2017-09-03 VITALS — BP 132/82 | HR 71 | Temp 98.4°F | Ht 71.0 in | Wt 231.0 lb

## 2017-09-03 DIAGNOSIS — Z125 Encounter for screening for malignant neoplasm of prostate: Secondary | ICD-10-CM

## 2017-09-03 DIAGNOSIS — Z1159 Encounter for screening for other viral diseases: Secondary | ICD-10-CM | POA: Diagnosis not present

## 2017-09-03 DIAGNOSIS — Z0000 Encounter for general adult medical examination without abnormal findings: Secondary | ICD-10-CM | POA: Diagnosis not present

## 2017-09-03 DIAGNOSIS — F419 Anxiety disorder, unspecified: Secondary | ICD-10-CM | POA: Diagnosis not present

## 2017-09-03 DIAGNOSIS — F329 Major depressive disorder, single episode, unspecified: Secondary | ICD-10-CM | POA: Diagnosis not present

## 2017-09-03 DIAGNOSIS — Z114 Encounter for screening for human immunodeficiency virus [HIV]: Secondary | ICD-10-CM

## 2017-09-03 DIAGNOSIS — M1990 Unspecified osteoarthritis, unspecified site: Secondary | ICD-10-CM

## 2017-09-03 LAB — COMPREHENSIVE METABOLIC PANEL
ALBUMIN: 3.7 g/dL (ref 3.5–5.2)
ALT: 18 U/L (ref 0–53)
AST: 18 U/L (ref 0–37)
Alkaline Phosphatase: 67 U/L (ref 39–117)
BUN: 22 mg/dL (ref 6–23)
CALCIUM: 8.3 mg/dL — AB (ref 8.4–10.5)
CHLORIDE: 110 meq/L (ref 96–112)
CO2: 26 meq/L (ref 19–32)
Creatinine, Ser: 0.95 mg/dL (ref 0.40–1.50)
GFR: 85.63 mL/min (ref >60.00)
Glucose, Bld: 103 mg/dL — ABNORMAL HIGH (ref 70–99)
POTASSIUM: 4.1 meq/L (ref 3.5–5.1)
Sodium: 141 mEq/L (ref 135–145)
Total Bilirubin: 0.4 mg/dL (ref 0.2–1.2)
Total Protein: 6.6 g/dL (ref 6.0–8.3)

## 2017-09-03 LAB — LIPID PANEL
CHOLESTEROL: 156 mg/dL (ref 0–200)
HDL: 44.7 mg/dL (ref >39.00)
LDL CALC: 91 mg/dL (ref 0–99)
NonHDL: 111.58
TRIGLYCERIDES: 103 mg/dL (ref 0.0–149.0)
Total CHOL/HDL Ratio: 3
VLDL: 20.6 mg/dL (ref 0.0–40.0)

## 2017-09-03 LAB — PSA: PSA: 0.62 ng/mL (ref 0.10–4.00)

## 2017-09-03 LAB — CBC
HEMATOCRIT: 39.3 % (ref 39.0–52.0)
Hemoglobin: 13 g/dL (ref 13.0–17.0)
MCHC: 33 g/dL (ref 30.0–36.0)
MCV: 87.8 fl (ref 78.0–100.0)
PLATELETS: 165 10*3/uL (ref 150.0–400.0)
RBC: 4.48 Mil/uL (ref 4.22–5.81)
RDW: 14.6 % (ref 11.5–15.5)
WBC: 7.9 10*3/uL (ref 4.0–10.5)

## 2017-09-03 MED ORDER — SERTRALINE HCL 50 MG PO TABS: 50.0000 mg | ORAL_TABLET | Freq: Every day | ORAL | 5 refills | Status: DC

## 2017-09-03 MED ORDER — AZITHROMYCIN 500 MG PO TABS: ORAL_TABLET | ORAL | 0 refills | Status: DC

## 2017-09-03 MED ORDER — SERTRALINE HCL 100 MG PO TABS: 100.0000 mg | ORAL_TABLET | Freq: Every day | ORAL | 5 refills | Status: DC

## 2017-09-03 MED ORDER — DICLOFENAC SODIUM 2 % TD SOLN: TRANSDERMAL | 1 refills | Status: DC

## 2017-09-03 NOTE — Telephone Encounter (Signed)
Called to cancel these prescriptions as went to incorrect pharmacy

## 2017-09-03 NOTE — Progress Notes (Signed)
Chief Complaint  Patient presents with  . Annual Exam    Well Male Charles Grimes is here for a complete physical.   His last physical was >1 year ago.  Current diet: in general, a "healthy" diet, could be healthier   Current exercise: none; physically active when it is warmer Weight trend: gaining since he quit  Does pt snore? Wears CPAP. Daytime fatigue? No. Seat belt? Yes.    Health maintenance Colonoscopy- Yes Tetanus- Yes HIV- No Hep C- No Lung cancer screening- No- has been ordered, insurance will not cover because he is not having symptoms Prostate cancer screening- No   Past Medical History:  Diagnosis Date  . Anginal pain ([place])   . Anxiety   . Arthritis   . Coronary artery disease, non-occlusive 08/2013   Mild to moderate (40% OM1) single vessel CAD. Otherwise nonobstructed.  . Depression   . Dyslipidemia, goal LDL below 100   . Essential hypertension   . Family history of premature CAD   . GERD (gastroesophageal reflux disease)   . H/O skin disorder    Involving hands. Subsequently resolved.   . Nonischemic cardiomyopathy ([place]) 05/2013   Non-ischemic: EF ~45% by Echo & Myoview --> Non-obstructive CAD  . Obesity (BMI 30.0-34.9)   . OSA (obstructive sleep apnea) 06/21/2017    Past Surgical History:  Procedure Laterality Date  . LEFT HEART CATHETERIZATION WITH CORONARY ANGIOGRAM  08/26/2013   Mild to moderate disease with 40-50% stenosis and OM1. Otherwise no significant CAD --  Surgeon: Leonie Man, MD;  Location: [facility] CATH LAB;  Service: Cardiovascular;;  . Lower extremity arterial Dopplers  06/11/2013   No occlusive disease  . LUMBAR LAMINECTOMY/DECOMPRESSION MICRODISCECTOMY Right 10/07/2014   Procedure: LUMBAR LAMINECTOMY/DECOMPRESSION MICRODISCECTOMY 1 LEVEL;  Surgeon: Kary Kos, MD;  Location: [facility] NEURO ORS;  Service: Neurosurgery;  Laterality: Right;  Right L3-L4 Microdiscectomy  . NM MYOVIEW LTD  06/03/2013   Low risk study with no ischemia. EF roughly  44% with no regional wall motion abnormalities noted  . PFTs  06/2013   Normal Volumes &  Spirometry; Moderately reduced DLCO  . SHOULDER HEMI-ARTHROPLASTY Right 05/28/2015   Procedure: RIGHT SHOULDER HEMI-ARTHROPLASTY CTA HEAD AND SUBSCAP REPAIR ;  Surgeon: Netta Cedars, MD;  Location: [facility];  Service: Orthopedics;  Laterality: Right;  . TRANSTHORACIC ECHOCARDIOGRAM  06/11/2013   Mildly reduced EF: 45-50%. Mild anterior hypokinesis with incoordinate septal motion. No evidence of pulmonary hypertension   Medications  Current Outpatient Medications on File Prior to Visit  Medication Sig Dispense Refill  . acetaminophen (TYLENOL) 650 MG CR tablet Take 650 mg by mouth every 8 (eight) hours as needed for pain.    Marland Kitchen aspirin EC 81 MG tablet Take 81 mg by mouth daily.    Marland Kitchen azithromycin (ZITHROMAX) 500 MG tablet Take 1 hour prior to dental procedure. 4 tablet 0  . losartan (COZAAR) 50 MG tablet Take 1 tablet (50 mg total) by mouth daily. 30 tablet 5  . meloxicam (MOBIC) 15 MG tablet Take 1 tablet (15 mg total) by mouth daily. 30 tablet 2  . nitroGLYCERIN (NITROSTAT) 0.4 MG SL tablet Place 1 tablet (0.4 mg total) under the tongue every 5 (five) minutes as needed for chest pain. 30 tablet 1  . omeprazole (PRILOSEC OTC) 20 MG tablet Take 1 tablet (20 mg total) by mouth daily. 30 tablet   . pravastatin (PRAVACHOL) 40 MG tablet Take 1 tablet (40 mg total) by mouth daily. 30 tablet 5  .  sertraline (ZOLOFT) 100 MG tablet Take 1 tablet (100 mg total) by mouth daily. 30 tablet 5  . sertraline (ZOLOFT) 50 MG tablet Take 1 tablet (50 mg total) by mouth daily. 30 tablet 5    Allergies Allergies  Allergen Reactions  . Isosorbide Nitrate Other (See Comments)    CONTINUOUS HEADACHE   . Oxycodone Itching    Reaction was to straight oxycodone 15 mg tablets - no reaction to percocet  . Penicillins Hives and Rash    Has patient had a PCN reaction causing immediate rash, facial/tongue/throat swelling, SOB or  lightheadedness with hypotension: Yes Has patient had a PCN reaction causing severe rash involving mucus membranes or skin necrosis: No Has patient had a PCN reaction that required hospitalization No Has patient had a PCN reaction occurring within the last 10 years: No If all of the above answers are "NO", then may proceed with Cephalosporin use.   Family History Family History  Problem Relation Age of Onset  . Atrial fibrillation Mother        Alive at 3  . COPD Mother   . Hypertension Mother   . Colonic polyp Mother   . Hypertension Father        Alive at 48  . Lung cancer Father        Chronic smoker  . Factor V Leiden deficiency Father        Also Lupus anticoagulant  . Heart attack Maternal Grandmother 48  . Heart failure Paternal Grandmother   . Diabetes Paternal Grandmother   . Heart attack Brother 24       X2  . Heart attack Sister 64       Deceased  . Healthy Sister        x2  . Healthy Son        x2  . Colon cancer Neg Hx   . Esophageal cancer Neg Hx   . Rectal cancer Neg Hx   . Stomach cancer Neg Hx     Review of Systems: Constitutional:  no fevers or chills Eye:  no recent significant change in vision Ear/Nose/Mouth/Throat:  Ears:  no hearing loss Nose/Mouth/Throat:  no complaints of nasal congestion or bleeding, no sore throat Cardiovascular:  no chest pain, no palpitations Respiratory:  no cough and no shortness of breath Gastrointestinal:  no abdominal pain, no change in bowel habits, no nausea, vomiting, diarrhea, or constipation and no black or bloody stool GU:  Male: negative for dysuria, frequency, and incontinence and negative for prostate symptoms Musculoskeletal/Extremities:  no pain, redness, or swelling of the joints Integumentary (Skin/Breast):  no abnormal skin lesions reported Neurologic:  no headaches Endocrine: No unexpected weight changes Hematologic/Lymphatic:  no abnormal bleeding  Exam BP 132/82 (BP Location: Left Arm, Patient  Position: Sitting, Cuff Size: Large)   Pulse 71   Temp 98.4 F (36.9 C) (Oral)   Ht 5\' 11"  (1.803 m)   Wt 231 lb (104.8 kg)   SpO2 94%   BMI 32.22 kg/m  General:  well developed, well nourished, in no apparent distress Skin:  no significant moles, warts, or growths Head:  no masses, lesions, or tenderness Eyes:  pupils equal and round, sclera anicteric without injection Ears:  canals without lesions, TMs shiny without retraction, no obvious effusion, no erythema Nose:  nares patent, septum midline, mucosa normal Throat/Pharynx:  lips and gingiva without lesion; tongue and uvula midline; non-inflamed pharynx; no exudates or postnasal drainage Neck: neck supple without adenopathy, thyromegaly, or masses  Lungs:  clear to auscultation, breath sounds equal bilaterally, no respiratory distress Cardio:  regular rate and rhythm without murmurs, heart sounds without clicks or rubs Abdomen:  abdomen soft, nontender; bowel sounds normal; no masses or organomegaly Genital (male): circumcised penis, no lesions or discharge; testes present bilaterally without masses or tenderness Rectal: Deferred Musculoskeletal:  symmetrical muscle groups noted without atrophy or deformity Extremities:  no clubbing, cyanosis, or edema, no deformities, no skin discoloration Neuro:  gait normal; deep tendon reflexes normal and symmetric Psych: well oriented with normal range of affect and appropriate judgment/insight  Assessment and Plan  Well adult exam - Plan: Comprehensive metabolic panel, Lipid panel  Anxiety and depression - Plan: sertraline (ZOLOFT) 100 MG tablet, CBC  Encounter for hepatitis C screening test for low risk patient - Plan: Hepatitis C antibody  Screening for HIV (human immunodeficiency virus) - Plan: HIV antibody  Screening for prostate cancer - Plan: PSA  Arthritis - Plan: Diclofenac Sodium 2 % SOLN   Well 62 y.o. male. Counseled on diet and exercise. Counseled on risks and benefits  of prostate cancer screening with PSA. The patient agrees to undergo testing. He was commended for quitting smoking.  Immunizations, labs, and further orders as above. Follow up in 6 weeks to recheck joint pain, will trial injections if knees still painful, may need to refer for other areas. The patient voiced understanding and agreement to the plan.  [place], DO 09/03/17 7:53 AM

## 2017-09-03 NOTE — Telephone Encounter (Signed)
Copied from [place] [id number]. Topic: Quick Communication - See Telephone Encounter >> Sep 03, 2017  9:16 AM Aurelio Brash B wrote: CRM for notification. See Telephone encounter for:  Jazmine from [facility] aide needs clarification on the pts rxs--  The first one azithromycin (ZITHROMAX) 500 MG tablet-  she wants to make sure its not suppose to be amoxicillin   And the second-  sertraline (ZOLOFT) -  she needs to know if its the 50mg  or the 100mg   to refill  Her contact number is [phone]  [facility] [address] - [place], [place] [address] [phone] (Phone) [phone] (Fax)   09/03/17.

## 2017-09-03 NOTE — Progress Notes (Signed)
Pre visit review using our clinic review tool, if applicable. No additional management support is needed unless otherwise documented below in the visit note. 

## 2017-09-03 NOTE — Patient Instructions (Addendum)
Give [place] 1-2 days to get the results of your labs back to you.   Great work on smoking cessation.  Aim to do some physical exertion for 150 minutes per week. This is typically divided into 5 days per week, 30 minutes per day. The activity should be enough to get your heart rate up. Anything is better than nothing if you have time constraints.  Let me know if you want to see an audiologist or get an ultrasound of your gall bladder.   Someone will call you regarding your topical medication to replace the meloxicam.   Let us know if you need anything.

## 2017-09-04 LAB — HIV ANTIBODY (ROUTINE TESTING W REFLEX): HIV 1&2 Ab, 4th Generation: NONREACTIVE

## 2017-09-04 LAB — HEPATITIS C ANTIBODY
Hepatitis C Ab: NONREACTIVE
SIGNAL TO CUT-OFF: 0.03 (ref <1.00)

## 2017-09-17 ENCOUNTER — Other Ambulatory Visit: Payer: Self-pay | Admitting: *Deleted

## 2017-09-17 ENCOUNTER — Telehealth: Payer: Self-pay | Admitting: Family Medicine

## 2017-09-17 ENCOUNTER — Telehealth: Payer: Self-pay

## 2017-09-17 DIAGNOSIS — M1990 Unspecified osteoarthritis, unspecified site: Secondary | ICD-10-CM

## 2017-09-17 MED ORDER — DICLOFENAC SODIUM 2 % TD SOLN: TRANSDERMAL | 1 refills | Status: DC

## 2017-09-17 NOTE — Telephone Encounter (Signed)
Spoke w/ Patty at [facility]- [id number]- PA initiated for Pennsaid 2% solution. PA approved effective 09/17/2017 through 09/12/2018. Approval number: [id number].

## 2017-09-17 NOTE — Telephone Encounter (Signed)
Copied from [place] [id number]. Topic: Quick Communication - Rx Refill/Question >> Sep 17, 2017  7:57 AM Scherrie Gerlach wrote: Medication: Diclofenac Sodium 2 % SOLN Has the patient contacted their pharmacy? yes (Agent: If no, request that the patient contact the pharmacy for the refill.) Preferred Pharmacy (with phone number or street name):    Rx sent to the wrong pharmacy.  They have never st up account with [employer] Please send to: [facility] ([place]), [place] - [place] [phone] (Phone) [phone] (Fax)

## 2017-09-17 NOTE — Telephone Encounter (Signed)
[facility] informed of PA approval  

## 2017-09-17 NOTE — Telephone Encounter (Signed)
Pt's Diclofenac Sodium was sent to [facility], [address]  [place]  PCP: Riki Sheer, DO   LOV  09/03/17  NOV  02/25/18

## 2017-09-17 NOTE — Telephone Encounter (Signed)
Sent to correct pharmacy

## 2017-10-02 ENCOUNTER — Telehealth: Payer: Self-pay | Admitting: *Deleted

## 2017-10-02 ENCOUNTER — Telehealth: Payer: Self-pay | Admitting: Family Medicine

## 2017-10-02 MED ORDER — LOSARTAN POTASSIUM 100 MG PO TABS: 50.0000 mg | ORAL_TABLET | Freq: Every day | ORAL | 1 refills | Status: DC

## 2017-10-02 NOTE — Telephone Encounter (Signed)
Rx sent as requested below.

## 2017-10-02 NOTE — Telephone Encounter (Signed)
Received fax from pt's pharmacy regarding Losartan 50 mg recall, as pt's lot was affected. They requested clarification as to if the prescription could be changed to 25 mg @ 2 tabs QD and/or 100 mg @ 0.5 tab QD. LMOM with contact name and number for return call RE: Rx recall and new Rx options for his preference; forwarded to provider/SLS 03/19

## 2017-10-02 NOTE — Telephone Encounter (Signed)
Let's do 100 mg, 1/2 tab daily. TY.

## 2017-10-02 NOTE — Telephone Encounter (Signed)
Copied from [place]. Topic: Inquiry >> Oct 02, 2017  2:18 PM Conception Chancy, NT wrote: Patient wife [name] is calling and states he went from Losartan 50mg  and they want to do Losartan 100mg  and then half of 100, so still 50mg  a day, due it the recall. If there is any question she can be reached at [facility] ([place]), [place] [address] [place] ([place]) [place] [address] Phone: [phone] Fax: [phone]

## 2017-10-03 NOTE — Telephone Encounter (Signed)
done

## 2017-10-15 ENCOUNTER — Ambulatory Visit: Payer: Medicaid Other | Admitting: Family Medicine

## 2018-02-25 ENCOUNTER — Ambulatory Visit: Payer: Medicaid Other | Admitting: Family Medicine

## 2018-02-25 ENCOUNTER — Encounter: Payer: Self-pay | Admitting: Family Medicine

## 2018-02-25 VITALS — BP 118/76 | HR 62 | Temp 98.3°F | Ht 71.0 in | Wt 230.4 lb

## 2018-02-25 DIAGNOSIS — I2510 Atherosclerotic heart disease of native coronary artery without angina pectoris: Secondary | ICD-10-CM | POA: Diagnosis not present

## 2018-02-25 DIAGNOSIS — M7712 Lateral epicondylitis, left elbow: Secondary | ICD-10-CM | POA: Diagnosis not present

## 2018-02-25 DIAGNOSIS — F329 Major depressive disorder, single episode, unspecified: Secondary | ICD-10-CM

## 2018-02-25 DIAGNOSIS — Z720 Tobacco use: Secondary | ICD-10-CM | POA: Insufficient documentation

## 2018-02-25 DIAGNOSIS — F419 Anxiety disorder, unspecified: Secondary | ICD-10-CM

## 2018-02-25 LAB — COMPREHENSIVE METABOLIC PANEL
ALBUMIN: 3.8 g/dL (ref 3.5–5.2)
ALK PHOS: 83 U/L (ref 39–117)
ALT: 15 U/L (ref 0–53)
AST: 17 U/L (ref 0–37)
BUN: 19 mg/dL (ref 6–23)
CO2: 24 mEq/L (ref 19–32)
Calcium: 8.8 mg/dL (ref 8.4–10.5)
Chloride: 109 mEq/L (ref 96–112)
Creatinine, Ser: 1.03 mg/dL (ref 0.40–1.50)
GFR: 77.88 mL/min (ref >60.00)
Glucose, Bld: 91 mg/dL (ref 70–99)
POTASSIUM: 3.9 meq/L (ref 3.5–5.1)
Sodium: 140 mEq/L (ref 135–145)
TOTAL PROTEIN: 7 g/dL (ref 6.0–8.3)
Total Bilirubin: 0.6 mg/dL (ref 0.2–1.2)

## 2018-02-25 LAB — LIPID PANEL
CHOLESTEROL: 152 mg/dL (ref 0–200)
HDL: 40.3 mg/dL (ref >39.00)
LDL Cholesterol: 85 mg/dL (ref 0–99)
NonHDL: 111.87
Total CHOL/HDL Ratio: 4
Triglycerides: 134 mg/dL (ref 0.0–149.0)
VLDL: 26.8 mg/dL (ref 0.0–40.0)

## 2018-02-25 MED ORDER — VARENICLINE TARTRATE 0.5 MG X 11 & 1 MG X 42 PO MISC: ORAL | 0 refills | Status: DC

## 2018-02-25 MED ORDER — AZITHROMYCIN 500 MG PO TABS: ORAL_TABLET | ORAL | 0 refills | Status: DC

## 2018-02-25 MED ORDER — LOSARTAN POTASSIUM 100 MG PO TABS: 50.0000 mg | ORAL_TABLET | Freq: Every day | ORAL | 1 refills | Status: DC

## 2018-02-25 MED ORDER — SERTRALINE HCL 100 MG PO TABS: 100.0000 mg | ORAL_TABLET | Freq: Every day | ORAL | 5 refills | Status: DC

## 2018-02-25 MED ORDER — PREDNISONE 20 MG PO TABS: 40.0000 mg | ORAL_TABLET | Freq: Every day | ORAL | 0 refills | Status: AC

## 2018-02-25 MED ORDER — PRAVASTATIN SODIUM 40 MG PO TABS: 40.0000 mg | ORAL_TABLET | Freq: Every day | ORAL | 5 refills | Status: DC

## 2018-02-25 MED ORDER — SERTRALINE HCL 50 MG PO TABS: 50.0000 mg | ORAL_TABLET | Freq: Every day | ORAL | 5 refills | Status: DC

## 2018-02-25 MED ORDER — NITROGLYCERIN 0.4 MG SL SUBL: 0.4000 mg | SUBLINGUAL_TABLET | SUBLINGUAL | 1 refills | Status: DC | PRN

## 2018-02-25 NOTE — Progress Notes (Signed)
Pre visit review using our clinic review tool, if applicable. No additional management support is needed unless otherwise documented below in the visit note. 

## 2018-02-25 NOTE — Patient Instructions (Addendum)
Don't fill the medicine if it is too expensive.  Ice/cold pack over area for 10-15 min twice daily.  OK to take Tylenol 1000 mg (2 extra strength tabs) or 975 mg (3 regular strength tabs) every 6 hours as needed.  Band-IT forearm strap for the tennis elbow can be helpful.   Elbow and Forearm Exercises It is normal to feel mild stretching, pulling, tightness, or discomfort as you do these exercises, but you should stop right away if you feel sudden pain or your pain gets worse. RANGE OF MOTION EXERCISES These exercises warm up your muscles and joints and improve the movement and flexibility of your injured elbow and forearm. These exercises also help to relieve pain, numbness, and tingling.These exercises are done using the muscles in your injured elbow and forearm. Exercise A: Elbow Flexion, Active 1. Hold your left / right arm at your side, and bend your elbow as far as you can using your left / right arm muscles. 2. Hold this position for 30 seconds. 3. Slowly return to the starting position. Repeat 2 times. Complete this exercise 3 times per week. Exercise B: Elbow Extension, Active 1. Hold your left / right arm at your side, and straighten your elbow as much as you can using your left / right arm muscles. 2. Hold this position for 30 seconds. 3. Slowly return to the starting position. Repeat 2 times. Complete this exercise 3 times per week. Exercise C: Forearm Rotation, Supination, Active 1. Stand or sit with your elbows at your sides. 2. Bend your left / right elbow to an "L" shape (90 degrees). 3. Turn your palm upward until you feel a gentle stretch on the inside of your forearm. 4. Hold this position for 30 seconds. 5. Slowly release and return to the starting position. Repeat 2 times. Complete this exercise 3 times per week. Exercise D: Forearm Rotation, Pronation, Active 1. Stand or sit with your elbows at your side. 2. Bend your left / right elbow to an "L" shape (90  degrees). 3. Turn your left / right palm downward until you feel a gentle stretch on the top of your forearm. 4. Hold this position for 30 seconds. 5. Slowly release and return to the starting position. Repeat2 times. Complete this exercise 3 times per week. STRETCHING EXERCISES These exercises warm up your muscles and joints and improve the movement and flexibility of your injured elbow and forearm. These exercises also help to relieve pain, numbness, and tingling.These exercises are done using your healthy elbow and forearm to help stretch the muscles in your injured elbow and forearm. Exercise E: Elbow Flexion, Active-Assisted  1. Hold your left / right arm at your side, and bend your elbow as much as you can using your left / right arm muscles. 2. Use your other hand to bend your left / right elbow farther. To do this, gently push up on your forearm until you feel a gentle stretch on the back of your elbow. 3. Hold this position for 30 seconds. 4. Slowly return to the starting position. Repeat 2 times. Complete this exercise 3 times per week. Exercise F: Elbow Extension, Active-Assisted  1. Hold your left / right arm at your side, and straighten your elbow as much as you can using your left / right arm muscles. 2. Use your other hand to straighten the left / right elbow farther. To do this, gently push down on your forearm until you feel a gentle stretch on the inside of  your elbow. 3. Hold this position for 30 seconds. 4. Slowly return to the starting position. Repeat 2 times. Complete this exercise 3 times per weeky. Exercise G: Forearm Rotation, Supination, Active-Assisted  1. Sit with your left / right elbow bent in an "L" shape (90 degrees) with your forearm resting on a table. 2. Keeping your upper body and shoulder still, rotate your forearm so your left / right palm faces upward. 3. Use your other hand to help rotate your forearm further until you feel a gentle to moderate  stretch. 4. Hold this position for 30 seconds. 5. Slowly release the stretch and return to the starting position. Repeat 2 times. Complete this exercise 3 times per week. Exercise H: Forearm Rotation, Pronation, Active-Assisted  1. Sit with your left / right elbow bent in an "L" shape (90 degrees) with your forearm resting on a table. 2. Keeping your upper body and shoulder still, rotate your forearm so your palm faces the tabletop. 3. Use your other hand to help rotate your forearm further until you feel a gentle to moderate stretch. 4. Hold this position for 30 seconds. 5. Slowly release the stretch and return to the starting position. Repeat 2 times. Complete this exercise 3 times per week. Exercise I: Elbow Flexion, Supine, Passive 1. Lie on your back. 2. Extend your left / right arm up in the air, bracing it with your other hand. 3. Let your left / right your hand slowly lower toward your shoulder, while your elbow stays pointed toward the ceiling. You should feel a gentle stretch along the back of your upper arm and elbow. 4. If instructed by your health care provider, you may increase the intensity of your stretch by adding a small wrist weight or hand weight. 5. Hold this position for 3 seconds. 6. Slowly return to the starting position. Repeat 2 times. Complete this exercise 3 times per week. Exercise J: Elbow Extension, Supine, Passive  1. Lie on your back. Make sure that you are in a comfortable position that lets you relax your arm muscles. 2. Place a folded towel under your left / right upper arm so your elbow and shoulder are at the same height. Straighten your left / right arm so your elbow does not rest on the bed or towel. 3. Let the weight of your hand stretch your elbow. Keep your arm and chest muscles relaxed. You should feel a stretch on the inside of your elbow. 4. If told by your health care provider, you may increase the intensity of your stretch by adding a small  wrist weight or hand weight. 5. Hold this position for 30 seconds. 6. Slowly release the stretch. Repeat 2 times. Complete this exercise 3 times per week. STRENGTHENING EXERCISES These exercises build strength and endurance in your elbow and forearm. Endurance is the ability to use your muscles for a long time, even after they get tired. Exercise K: Elbow Flexion, Isometric  1. Stand or sit up straight. 2. Bend your left / right elbow in an "L" shape (90 degrees) and turn your palm up so your forearm is at the height of your waist. 3. Place your other hand on top of your forearm. Gently push down as your left / right arm resists. Push as hard as you can with both arms without causing any pain or movement at your left / right elbow. 4. Hold this position for 3 seconds. 5. Slowly release the tension in both arms. Let your muscles  relax completely before repeating. Repeat 2 times. Complete this exercise 3 times per week. Exercise L: Elbow Extensors, Isometric  1. Stand or sit up straight. 2. Place your left / right arm so your palm faces your abdomen and it is at the height of your waist. 3. Place your other hand on the underside of your forearm. Gently push up as your left / right arm resists. Push as hard as you can with both arms, without causing any pain or movement at your left / right elbow. 4. Hold this position for 3 seconds. 5. Slowly release the tension in both arms. Let your muscles relax completely before repeating. Repeat _______2___ times. Complete this exercise 3 times per week. Exercise M: Elbow Flexion With Forearm Palm Up  1. Sit upright on a firm chair without armrests, or stand. 2. Place your left / right arm at your side with your palm facing forward. 3. Holding a 5 lbweight or gripping a rubber exercise band or tubing, bend your elbow to bring your hand toward your shoulder. 4. Hold this position for 3 seconds. 5. Slowly return to the starting position. Repeat 2 times.  Complete this exercise 3 times per week. Exercise N: Elbow Extension  1. Sit on a firm chair without armrests, or stand. 2. Keeping your upper arms at your sides, bring both hands up toward your left / right shoulder while you grip a rubber exercise band or tubing. Your left / right hand should be just below the other hand. 3. Straighten your left / right elbow. 4. Hold this position for 3 seconds. 5. Control the resistance of the band or tubing as your hand returns to your side. Repeat 2 times. Complete this exercise 3 times per week. Exercise O: Forearm Rotation, Supination  1. Sit with your left / right forearm supported on a table. Keep your elbow at waist height. 2. Rest your hand over the edge of the table with your palm facing down. 3. Gently hold a lightweight hammer. 4. Without moving your elbow, slowly rotate your forearm to turn your palm and hand upward to a "thumbs-up" position. 5. Hold this position for 3 seconds. 6. Slowly return to the starting position. Repeat 2 times. Complete this exercise 3 times per week. Exercise P: Forearm Rotation, Pronation  1. Sit with your left / right forearm supported on a table. Keep your elbow below shoulder height. 2. Rest your hand over the edge of the table with your palm facing up. 3. Gently hold a lightweight hammer. 4. Without moving your elbow, slowly rotate your forearm to turn your palm and hand upward to a "thumbs-up" position. 5. Hold this position for 3 seconds. 6. Slowly return to the starting position. Repeat 2 times. Complete this exercise 3 times per week.  Make sure you discuss any questions you have with your health care provider. Document Released: 05/17/2005 Document Revised: 11/11/2015 Document Reviewed: 03/28/2015 Elsevier Interactive Patient Education  [employer].

## 2018-02-25 NOTE — Progress Notes (Signed)
Chief Complaint  Patient presents with  . Follow-up    Subjective: Patient is a 62 y.o. male here for med f/u.  Doing well on current meds. Reports compliance, no AE's. Physically active in yard, no exercise. Does not check BP's. No myalgias. No SI or HI.   L elbow pain for past 2 mo. No inj or change in activity. Sometimes it looks swollen. Hurts to move or extending wrist. Has tried topical NSAID w/o relief. Ice and heat also not helpful. He does not play tennis.     ROS: Heart: Denies chest pain   Lungs: Denies SOB  MSK: As noted in HPI  Past Medical History:  Diagnosis Date  . Anginal pain ([place])   . Anxiety   . Arthritis   . Coronary artery disease, non-occlusive 08/2013   Mild to moderate (40% OM1) single vessel CAD. Otherwise nonobstructed.  . Depression   . Dyslipidemia, goal LDL below 100   . Essential hypertension   . Family history of premature CAD   . GERD (gastroesophageal reflux disease)   . H/O skin disorder    Involving hands. Subsequently resolved.   . Nonischemic cardiomyopathy ([place]) 05/2013   Non-ischemic: EF ~45% by Echo & Myoview --> Non-obstructive CAD  . Obesity (BMI 30.0-34.9)   . OSA (obstructive sleep apnea) 06/21/2017   Objective: BP 118/76 (BP Location: Left Arm, Patient Position: Sitting, Cuff Size: Large)   Pulse 62   Temp 98.3 F (36.8 C) (Oral)   Ht 5\' 11"  (1.803 m)   Wt 230 lb 6 oz (104.5 kg)   SpO2 93%   BMI 32.13 kg/m  General: Awake, appears stated age HEENT: MMM, EOMi Heart: RRR, no murmurs Lungs: CTAB, no rales, wheezes or rhonchi. No accessory muscle use MSK: +TTP over L lat epicondyle, +pain w resisted L wrist extension Neuro: Sensation intact to light touch, no cerebellar signs Psych: Age appropriate judgment and insight, normal affect and mood  Assessment and Plan: Coronary artery disease, non-occlusive - Plan: pravastatin (PRAVACHOL) 40 MG tablet, nitroGLYCERIN (NITROSTAT) 0.4 MG SL tablet, losartan (COZAAR) 100 MG  tablet, Comprehensive metabolic panel, Lipid panel  Anxiety and depression - Plan: sertraline (ZOLOFT) 100 MG tablet  Lateral epicondylitis of left elbow - Plan: predniSONE (DELTASONE) 20 MG tablet  Tobacco abuse - Plan: varenicline (CHANTIX PAK) 0.5 MG X 11 & 1 MG X 42 tablet  Refills as above. Prednisone for inflammation of elbow, forearm strap recommended, stretches/exercises, ice. Counseled on tobacco cessation.  Patient is in pre-contemplative phase.  He has failed patches and behavioral modification.  He would like to try Chantix.  If this is too expensive, we will try Zyban. F.u in 2 mo.  The patient voiced understanding and agreement to the plan.  [place], DO 02/25/18  8:51 AM

## 2018-03-24 ENCOUNTER — Other Ambulatory Visit: Payer: Self-pay | Admitting: Family Medicine

## 2018-03-24 DIAGNOSIS — Z720 Tobacco use: Secondary | ICD-10-CM

## 2018-03-25 MED ORDER — VARENICLINE TARTRATE 1 MG PO TABS: 1.0000 mg | ORAL_TABLET | Freq: Two times a day (BID) | ORAL | 1 refills | Status: DC

## 2018-03-25 NOTE — Telephone Encounter (Signed)
Continuation pack ordered.

## 2018-04-15 ENCOUNTER — Ambulatory Visit: Payer: Medicaid Other | Admitting: Family

## 2018-04-15 ENCOUNTER — Encounter: Payer: Self-pay | Admitting: Family

## 2018-04-15 VITALS — BP 132/82 | HR 62 | Temp 98.0°F | Resp 16 | Ht 71.0 in | Wt 236.0 lb

## 2018-04-15 DIAGNOSIS — Z23 Encounter for immunization: Secondary | ICD-10-CM | POA: Diagnosis not present

## 2018-04-15 DIAGNOSIS — M25522 Pain in left elbow: Secondary | ICD-10-CM

## 2018-04-15 DIAGNOSIS — M25521 Pain in right elbow: Secondary | ICD-10-CM | POA: Diagnosis not present

## 2018-04-15 DIAGNOSIS — M2550 Pain in unspecified joint: Secondary | ICD-10-CM | POA: Diagnosis not present

## 2018-04-15 LAB — URIC ACID: Uric Acid, Serum: 5.5 mg/dL (ref 4.0–7.8)

## 2018-04-15 NOTE — Progress Notes (Signed)
Subjective:    Patient ID: Charles Grimes, male    DOB: Mar 17, 1956, 62 y.o.   MRN: [id number]  HPI  Patient is a 62 yr old male who presents today with c/o swelling and pain in hands and left elbow.  Reports that symptoms began 2 months ago. Denies fevers or hx of gout.  Has tried OTC tylenol arthritis without much improvement in the pain.    Review of Systems See HPI  Past Medical History:  Diagnosis Date  . Anginal pain ([place])   . Anxiety   . Arthritis   . Coronary artery disease, non-occlusive 08/2013   Mild to moderate (40% OM1) single vessel CAD. Otherwise nonobstructed.  . Depression   . Dyslipidemia, goal LDL below 100   . Essential hypertension   . Family history of premature CAD   . GERD (gastroesophageal reflux disease)   . H/O skin disorder    Involving hands. Subsequently resolved.   . Nonischemic cardiomyopathy ([place]) 05/2013   Non-ischemic: EF ~45% by Echo & Myoview --> Non-obstructive CAD  . Obesity (BMI 30.0-34.9)   . OSA (obstructive sleep apnea) 06/21/2017     Social History   Socioeconomic History  . Marital status: Married    Spouse name: Not on file  . Number of children: Not on file  . Years of education: Not on file  . Highest education level: Not on file  Occupational History  . Not on file  Social Needs  . Financial resource strain: Not on file  . Food insecurity:    Worry: Not on file    Inability: Not on file  . Transportation needs:    Medical: Not on file    Non-medical: Not on file  Tobacco Use  . Smoking status: Former Smoker    Packs/day: 1.00    Years: 42.00    Pack years: 42.00    Types: Cigarettes    Last attempt to quit: 08/27/2017    Years since quitting: 0.6  . Smokeless tobacco: Never Used  Substance and Sexual Activity  . Alcohol use: Yes    Alcohol/week: 0.0 standard drinks    Comment: rarely  . Drug use: No  . Sexual activity: Yes    Partners: Female  Lifestyle  . Physical activity:    Days per week: Not on  file    Minutes per session: Not on file  . Stress: Not on file  Relationships  . Social connections:    Talks on phone: Not on file    Gets together: Not on file    Attends religious service: Not on file    Active member of club or organization: Not on file    Attends meetings of clubs or organizations: Not on file    Relationship status: Not on file  . Intimate partner violence:    Fear of current or ex partner: Not on file    Emotionally abused: Not on file    Physically abused: Not on file    Forced sexual activity: Not on file  Other Topics Concern  . Not on file  Social History Narrative   Married father of 2 boys. Lives with wife, [name], and one son.   He works as a Designer, television/film set for [employer]   He currently smokes one pack a day, cut down from 2 packs per day.   Only occasional beer and mixed drinks.   Does not exercise because he is too tired.  Past Surgical History:  Procedure Laterality Date  . LEFT HEART CATHETERIZATION WITH CORONARY ANGIOGRAM  08/26/2013   Mild to moderate disease with 40-50% stenosis and OM1. Otherwise no significant CAD --  Surgeon: Leonie Man, MD;  Location: [facility] CATH LAB;  Service: Cardiovascular;;  . Lower extremity arterial Dopplers  06/11/2013   No occlusive disease  . LUMBAR LAMINECTOMY/DECOMPRESSION MICRODISCECTOMY Right 10/07/2014   Procedure: LUMBAR LAMINECTOMY/DECOMPRESSION MICRODISCECTOMY 1 LEVEL;  Surgeon: Kary Kos, MD;  Location: [place] NEURO ORS;  Service: Neurosurgery;  Laterality: Right;  Right L3-L4 Microdiscectomy  . NM MYOVIEW LTD  06/03/2013   Low risk study with no ischemia. EF roughly 44% with no regional wall motion abnormalities noted  . PFTs  06/2013   Normal Volumes &  Spirometry; Moderately reduced DLCO  . SHOULDER HEMI-ARTHROPLASTY Right 05/28/2015   Procedure: RIGHT SHOULDER HEMI-ARTHROPLASTY CTA HEAD AND SUBSCAP REPAIR ;  Surgeon: Netta Cedars, MD;  Location: [place];   Service: Orthopedics;  Laterality: Right;  . TRANSTHORACIC ECHOCARDIOGRAM  06/11/2013   Mildly reduced EF: 45-50%. Mild anterior hypokinesis with incoordinate septal motion. No evidence of pulmonary hypertension    Family History  Problem Relation Age of Onset  . Atrial fibrillation Mother        Alive at 15  . COPD Mother   . Hypertension Mother   . Colonic polyp Mother   . Hypertension Father        Alive at 47  . Lung cancer Father        Chronic smoker  . Factor V Leiden deficiency Father        Also Lupus anticoagulant  . Heart attack Maternal Grandmother 48  . Heart failure Paternal Grandmother   . Diabetes Paternal Grandmother   . Heart attack Brother 33       X2  . Heart attack Sister 56       Deceased  . Healthy Sister        x2  . Healthy Son        x2  . Colon cancer Neg Hx   . Esophageal cancer Neg Hx   . Rectal cancer Neg Hx   . Stomach cancer Neg Hx     Allergies  Allergen Reactions  . Isosorbide Nitrate Other (See Comments)    CONTINUOUS HEADACHE   . Oxycodone Itching    Reaction was to straight oxycodone 15 mg tablets - no reaction to percocet  . Penicillins Hives and Rash    Has patient had a PCN reaction causing immediate rash, facial/tongue/throat swelling, SOB or lightheadedness with hypotension: Yes Has patient had a PCN reaction causing severe rash involving mucus membranes or skin necrosis: No Has patient had a PCN reaction that required hospitalization No Has patient had a PCN reaction occurring within the last 10 years: No If all of the above answers are "NO", then may proceed with Cephalosporin use.    Current Outpatient Medications on File Prior to Visit  Medication Sig Dispense Refill  . acetaminophen (TYLENOL) 650 MG CR tablet Take 650 mg by mouth every 8 (eight) hours as needed for pain.    Marland Kitchen aspirin EC 81 MG tablet Take 81 mg by mouth daily.    Marland Kitchen azithromycin (ZITHROMAX) 500 MG tablet Take 1 hour prior to dental procedure. 4 tablet  0  . Diclofenac Sodium 2 % SOLN Use thin layer over hands and elbows 4 times daily as needed for pain. 1 Bottle 1  . losartan (COZAAR) 100 MG tablet  Take 0.5 tablets (50 mg total) by mouth daily. 45 tablet 1  . nitroGLYCERIN (NITROSTAT) 0.4 MG SL tablet Place 1 tablet (0.4 mg total) under the tongue every 5 (five) minutes as needed for chest pain. 30 tablet 1  . omeprazole (PRILOSEC OTC) 20 MG tablet Take 1 tablet (20 mg total) by mouth daily. 30 tablet   . pravastatin (PRAVACHOL) 40 MG tablet Take 1 tablet (40 mg total) by mouth daily. 30 tablet 5  . sertraline (ZOLOFT) 100 MG tablet Take 1 tablet (100 mg total) by mouth daily. 30 tablet 5  . sertraline (ZOLOFT) 50 MG tablet Take 1 tablet (50 mg total) by mouth daily. 30 tablet 5  . varenicline (CHANTIX) 1 MG tablet Take 1 tablet (1 mg total) by mouth 2 (two) times daily. 60 tablet 1   Current Facility-Administered Medications on File Prior to Visit  Medication Dose Route Frequency Provider Last Rate Last Dose  . 0.9 %  sodium chloride infusion  500 mL Intravenous Continuous Pyrtle, Lajuan Lines, MD        BP 132/82 (BP Location: Left Arm, Patient Position: Sitting, Cuff Size: Large)   Pulse 62   Temp 98 F (36.7 C) (Oral)   Resp 16   Ht 5\' 11"  (1.803 m)   Wt 236 lb (107 kg)   SpO2 98%   BMI 32.92 kg/m       Objective:   Physical Exam  Constitutional: He is oriented to person, place, and time. He appears well-developed and well-nourished. No distress.  HENT:  Head: Normocephalic and atraumatic.  Cardiovascular: Normal rate and regular rhythm.  No murmur heard. Pulmonary/Chest: Effort normal and breath sounds normal. No respiratory distress. He has no wheezes. He has no rales.  Musculoskeletal: He exhibits no edema.  + swelling and mild erythema overlying left lateral epicondyle, slight swelling overlying right lateral epicondyle  Heberden's nodes noted bilateral hands  Neurological: He is alert and oriented to person, place, and  time.  Skin: Skin is warm and dry.  Psychiatric: He has a normal mood and affect. His behavior is normal. Thought content normal.          Assessment & Plan:  Elbow pain- suspect lateral epicondylitis. Will avoid nsaids due to hx of CAD.  Continue tylenol prn. Refer to sports medicine.  Will also obtain Rheumatoid factor and uric acid.   Flu shot today.

## 2018-04-15 NOTE — Patient Instructions (Signed)
Please complete lab work prior to leaving. You may continue to use tylenol as needed for pain. You should be contacted about your referral to sports medicine.

## 2018-04-16 ENCOUNTER — Encounter: Payer: Self-pay | Admitting: Family

## 2018-04-16 ENCOUNTER — Encounter: Payer: Self-pay | Admitting: Family Medicine

## 2018-04-16 ENCOUNTER — Ambulatory Visit: Payer: Medicaid Other | Admitting: Family Medicine

## 2018-04-16 VITALS — BP 112/76 | HR 73 | Ht 71.0 in | Wt 236.0 lb

## 2018-04-16 DIAGNOSIS — M25521 Pain in right elbow: Secondary | ICD-10-CM | POA: Diagnosis not present

## 2018-04-16 DIAGNOSIS — M25522 Pain in left elbow: Secondary | ICD-10-CM

## 2018-04-16 LAB — RHEUMATOID FACTOR

## 2018-04-16 MED ORDER — NITROGLYCERIN 0.2 MG/HR TD PT24: MEDICATED_PATCH | TRANSDERMAL | 1 refills | Status: DC

## 2018-04-16 NOTE — Patient Instructions (Addendum)
You have lateral epicondylitis with median nerve irritation Try to avoid painful activities as much as possible. Ice the area 3-4 times a day for 15 minutes at a time as needed. Avoid ibuprofen or aleve. Try nitro patches - 1/4th patch to affected elbow on left, change daily - if you don't get a headache, ok to start using 1/4th on right elbow also. Counterforce brace as directed can help unload area - wear this regularly if it provides you with relief. Wrist braces as often as possible including with sleep. Hammer rotation exercise, wrist extension exercise with 1 pound weight - 3 sets of 10 once a day.   Stretching - hold for 20-30 seconds and repeat 3 times. Consider physical therapy, injection(s) if not improving. Follow up in 6 weeks but call me sooner if struggling and you want to change course.

## 2018-04-17 ENCOUNTER — Encounter: Payer: Self-pay | Admitting: Family Medicine

## 2018-04-17 NOTE — Progress Notes (Signed)
PCP: Charles Pal, DO Consultation requested by: Charles Alar NP  Subjective:   HPI: Patient is a 62 y.o. male here for left elbow, bilateral hand pain.  Patient reports he's had about 6 weeks of problems that started with lateral left elbow pain. Pain is up to 5-6/10, sharp. Has since developed pain down arm to wrist into 1st-3rd digits. Feels funny in these fingers on both sides but not numb. Tried counterforce brace, prednisone. Prednisone helped only while he was on this. Recently developed pain lateral right elbow as well. Worse with motions of wrist, fingers. No skin changes.  Past Medical History:  Diagnosis Date  . Anginal pain ([place])   . Anxiety   . Arthritis   . Coronary artery disease, non-occlusive 08/2013   Mild to moderate (40% OM1) single vessel CAD. Otherwise nonobstructed.  . Depression   . Dyslipidemia, goal LDL below 100   . Essential hypertension   . Family history of premature CAD   . GERD (gastroesophageal reflux disease)   . H/O skin disorder    Involving hands. Subsequently resolved.   . Nonischemic cardiomyopathy ([place]) 05/2013   Non-ischemic: EF ~45% by Echo & Myoview --> Non-obstructive CAD  . Obesity (BMI 30.0-34.9)   . OSA (obstructive sleep apnea) 06/21/2017    Current Outpatient Medications on File Prior to Visit  Medication Sig Dispense Refill  . acetaminophen (TYLENOL) 650 MG CR tablet Take 650 mg by mouth every 8 (eight) hours as needed for pain.    Marland Kitchen aspirin EC 81 MG tablet Take 81 mg by mouth daily.    Marland Kitchen losartan (COZAAR) 100 MG tablet Take 0.5 tablets (50 mg total) by mouth daily. 45 tablet 1  . nitroGLYCERIN (NITROSTAT) 0.4 MG SL tablet Place 1 tablet (0.4 mg total) under the tongue every 5 (five) minutes as needed for chest pain. 30 tablet 1  . omeprazole (PRILOSEC OTC) 20 MG tablet Take 1 tablet (20 mg total) by mouth daily. 30 tablet   . pravastatin (PRAVACHOL) 40 MG tablet Take 1 tablet (40 mg total) by mouth daily.  30 tablet 5  . sertraline (ZOLOFT) 100 MG tablet Take 1 tablet (100 mg total) by mouth daily. 30 tablet 5  . sertraline (ZOLOFT) 50 MG tablet Take 1 tablet (50 mg total) by mouth daily. 30 tablet 5   Current Facility-Administered Medications on File Prior to Visit  Medication Dose Route Frequency Provider Last Rate Last Dose  . 0.9 %  sodium chloride infusion  500 mL Intravenous Continuous Pyrtle, [place] Lines, MD        Past Surgical History:  Procedure Laterality Date  . LEFT HEART CATHETERIZATION WITH CORONARY ANGIOGRAM  08/26/2013   Mild to moderate disease with 40-50% stenosis and OM1. Otherwise no significant CAD --  Surgeon: [place] Man, MD;  Location: [facility] CATH LAB;  Service: Cardiovascular;;  . Lower extremity arterial Dopplers  06/11/2013   No occlusive disease  . LUMBAR LAMINECTOMY/DECOMPRESSION MICRODISCECTOMY Right 10/07/2014   Procedure: LUMBAR LAMINECTOMY/DECOMPRESSION MICRODISCECTOMY 1 LEVEL;  Surgeon: [place] Kos, MD;  Location: [place] NEURO ORS;  Service: Neurosurgery;  Laterality: Right;  Right L3-L4 Microdiscectomy  . NM MYOVIEW LTD  06/03/2013   Low risk study with no ischemia. EF roughly 44% with no regional wall motion abnormalities noted  . PFTs  06/2013   Normal Volumes &  Spirometry; Moderately reduced DLCO  . SHOULDER HEMI-ARTHROPLASTY Right 05/28/2015   Procedure: RIGHT SHOULDER HEMI-ARTHROPLASTY CTA HEAD AND SUBSCAP REPAIR ;  Surgeon: [place] Cedars,  MD;  Location: [place];  Service: Orthopedics;  Laterality: Right;  . TRANSTHORACIC ECHOCARDIOGRAM  06/11/2013   Mildly reduced EF: 45-50%. Mild anterior hypokinesis with incoordinate septal motion. No evidence of pulmonary hypertension    Allergies  Allergen Reactions  . Isosorbide Nitrate Other (See Comments)    CONTINUOUS HEADACHE   . Oxycodone Itching    Reaction was to straight oxycodone 15 mg tablets - no reaction to percocet  . Penicillins Hives and Rash    Has patient had a PCN reaction causing immediate rash,  facial/tongue/throat swelling, SOB or lightheadedness with hypotension: Yes Has patient had a PCN reaction causing severe rash involving mucus membranes or skin necrosis: No Has patient had a PCN reaction that required hospitalization No Has patient had a PCN reaction occurring within the last 10 years: No If all of the above answers are "NO", then may proceed with Cephalosporin use.    Social History   Socioeconomic History  . Marital status: Married    Spouse name: Not on file  . Number of children: Not on file  . Years of education: Not on file  . Highest education level: Not on file  Occupational History  . Not on file  Social Needs  . Financial resource strain: Not on file  . Food insecurity:    Worry: Not on file    Inability: Not on file  . Transportation needs:    Medical: Not on file    Non-medical: Not on file  Tobacco Use  . Smoking status: Former Smoker    Packs/day: 1.00    Years: 42.00    Pack years: 42.00    Types: Cigarettes    Last attempt to quit: 08/27/2017    Years since quitting: 0.6  . Smokeless tobacco: Never Used  Substance and Sexual Activity  . Alcohol use: Yes    Alcohol/week: 0.0 standard drinks    Comment: rarely  . Drug use: No  . Sexual activity: Yes    Partners: Female  Lifestyle  . Physical activity:    Days per week: Not on file    Minutes per session: Not on file  . Stress: Not on file  Relationships  . Social connections:    Talks on phone: Not on file    Gets together: Not on file    Attends religious service: Not on file    Active member of club or organization: Not on file    Attends meetings of clubs or organizations: Not on file    Relationship status: Not on file  . Intimate partner violence:    Fear of current or ex partner: Not on file    Emotionally abused: Not on file    Physically abused: Not on file    Forced sexual activity: Not on file  Other Topics Concern  . Not on file  Social History Narrative   Married  father of 2 boys. Lives with wife, Charles Grimes, and one son.   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   Only occasional beer and mixed drinks.   Does not exercise because he is too tired.    Family History  Problem Relation Age of Onset  . Atrial fibrillation Mother        Alive at 74  . COPD Mother   . Hypertension Mother   . Colonic polyp Mother   .  Hypertension Father        Alive at 37  . Lung cancer Father        Chronic smoker  . Factor V Leiden deficiency Father        Also Lupus anticoagulant  . Heart attack Maternal Grandmother 48  . Heart failure Paternal Grandmother   . Diabetes Paternal Grandmother   . Heart attack Brother 53       X2  . Heart attack Sister 29       Deceased  . Healthy Sister        x2  . Healthy Son        x2  . Colon cancer Neg Hx   . Esophageal cancer Neg Hx   . Rectal cancer Neg Hx   . Stomach cancer Neg Hx     BP 112/76   Pulse 73   Ht 5\' 11"  (1.803 m)   Wt 236 lb (107 kg)   BMI 32.92 kg/m   Review of Systems: See HPI above.     Objective:  Physical Exam:  Gen: NAD, comfortable in exam room  Left elbow: No deformity, swelling, bruising. FROM with 5/5 strength wrist flexion/extension, finger extension, elbow flexion and extension.  Pain with wrist and 3rd digit extension. TTP lateral epicondyle.  No other tenderness. Collateral ligaments intact. NVI distally.  Right elbow: No deformity, swelling, bruising. FROM with 5/5 strength wrist flexion/extension, finger extension, elbow flexion and extension.  Pain with wrist and 3rd digit extension. TTP lateral epicondyle.  No other tenderness. Collateral ligaments intact. NVI distally.  Left wrist/hand: No deformity. FROM with 5/5 strength. No tenderness to palpation. NVI distally. Negative tinels and phalens.  Right wrist/hand: No deformity. FROM with  5/5 strength. No tenderness to palpation. NVI distally. Negative tinels and phalens.  MSK u/s:  Bilateral median nerve volume 0.08cm2.  Extensor tendon left elbow thickened with hypoechoic change, calcification near insertion.  Right elbow extensor tendon with minimal thickening, mild hypoechoic change.   Assessment & Plan:  1. Bilateral elbow pain - 2/2 lateral epicondylitis though describes median nerve irritation as well without evidence impingement at carpal tunnel.  Icing, try nitro patches.  Wrist braces provided with counterforce braces.  Reviewed home exercises.  Declined physical therapy for now.  F/u in 6 weeks.

## 2018-05-01 ENCOUNTER — Ambulatory Visit (INDEPENDENT_AMBULATORY_CARE_PROVIDER_SITE_OTHER): Payer: Medicaid Other | Admitting: Family Medicine

## 2018-05-01 ENCOUNTER — Encounter: Payer: Self-pay | Admitting: Family Medicine

## 2018-05-01 VITALS — BP 114/82 | HR 66 | Temp 98.0°F | Ht 71.0 in | Wt 236.2 lb

## 2018-05-01 DIAGNOSIS — M7712 Lateral epicondylitis, left elbow: Secondary | ICD-10-CM

## 2018-05-01 DIAGNOSIS — Z720 Tobacco use: Secondary | ICD-10-CM

## 2018-05-01 MED ORDER — NICOTINE 7 MG/24HR TD PT24: 7.0000 mg | MEDICATED_PATCH | Freq: Every day | TRANSDERMAL | 0 refills | Status: AC

## 2018-05-01 MED ORDER — MELOXICAM 15 MG PO TABS: 15.0000 mg | ORAL_TABLET | Freq: Every day | ORAL | 0 refills | Status: DC

## 2018-05-01 NOTE — Progress Notes (Signed)
Pre visit review using our clinic review tool, if applicable. No additional management support is needed unless otherwise documented below in the visit note. 

## 2018-05-01 NOTE — Patient Instructions (Addendum)
Very strong work.   2 weeks of the patch and I think you will be smoke free.  Continue wearing brace, icing and the exercises.   Let us know if you need anything.

## 2018-05-01 NOTE — Progress Notes (Signed)
Chief Complaint  Patient presents with  . Follow-up    Subjective: Patient is a 62 y.o. male here for smoking cessation.  Pt was placed on Chantix and is down to 1 cigarette daily. Ins would not cover a refill of Chantix. Interested in patch now.   Tennis elbow still bothering. Using brace and ice. Exercises also. Mobic was helpful and would like refill.   ROS: Heart: Denies chest pain  Lungs: Denies SOB   Past Medical History:  Diagnosis Date  . Anginal pain ([place])   . Anxiety   . Arthritis   . Coronary artery disease, non-occlusive 08/2013   Mild to moderate (40% OM1) single vessel CAD. Otherwise nonobstructed.  . Depression   . Dyslipidemia, goal LDL below 100   . Essential hypertension   . Family history of premature CAD   . GERD (gastroesophageal reflux disease)   . H/O skin disorder    Involving hands. Subsequently resolved.   . Nonischemic cardiomyopathy ([place]) 05/2013   Non-ischemic: EF ~45% by Echo & Myoview --> Non-obstructive CAD  . Obesity (BMI 30.0-34.9)   . OSA (obstructive sleep apnea) 06/21/2017    Objective: BP 114/82 (BP Location: Left Arm, Patient Position: Sitting, Cuff Size: Normal)   Pulse 66   Temp 98 F (36.7 C) (Oral)   Ht 5\' 11"  (1.803 m)   Wt 236 lb 4 oz (107.2 kg)   SpO2 96%   BMI 32.95 kg/m  General: Awake, appears stated age MSK: +TTP over lat epicondyle on L Heart: RRR, no murmurs Lungs: CTAB, no rales, wheezes or rhonchi. No accessory muscle use Psych: Age appropriate judgment and insight, normal affect and mood  Assessment and Plan: Tobacco abuse - Plan: nicotine (NICODERM CQ - DOSED IN MG/24 HR) 7 mg/24hr patch  Lateral epicondylitis of left elbow - Plan: meloxicam (MOBIC) 15 MG tablet  1- doing very well. Will supp w low dose nicotine patch for 2 weeks.  2- Improving. Cont ice and stretches/exercises and braces. Refill nsaid.  F/u prn at this pt.  The patient voiced understanding and agreement to the plan.  [place], DO 05/01/18  4:16 PM

## 2018-05-08 ENCOUNTER — Encounter: Payer: Self-pay | Admitting: Family Medicine

## 2018-05-27 ENCOUNTER — Encounter: Payer: Self-pay | Admitting: Family Medicine

## 2018-05-27 ENCOUNTER — Ambulatory Visit: Payer: Medicaid Other | Admitting: Family Medicine

## 2018-05-27 VITALS — BP 168/101 | HR 61 | Ht 71.0 in | Wt 240.0 lb

## 2018-05-27 DIAGNOSIS — M7712 Lateral epicondylitis, left elbow: Secondary | ICD-10-CM

## 2018-05-27 MED ORDER — METHYLPREDNISOLONE ACETATE 40 MG/ML IJ SUSP
40.0000 mg | Freq: Once | INTRAMUSCULAR | Status: AC
  Administered 2018-05-27: 40 mg via INTRA_ARTICULAR

## 2018-05-27 MED ORDER — DICLOFENAC SODIUM 75 MG PO TBEC: 75.0000 mg | DELAYED_RELEASE_TABLET | Freq: Two times a day (BID) | ORAL | 1 refills | Status: DC | PRN

## 2018-05-27 NOTE — Progress Notes (Signed)
PCP: [name], DO Consultation requested by: Debbrah Alar NP  Subjective:   HPI: Patient is a 62 y.o. male here for left elbow, bilateral hand pain.  10/1: Patient reports he's had about 6 weeks of problems that started with lateral left elbow pain. Pain is up to 5-6/10, sharp. Has since developed pain down arm to wrist into 1st-3rd digits. Feels funny in these fingers on both sides but not numb. Tried counterforce brace, prednisone. Prednisone helped only while he was on this. Recently developed pain lateral right elbow as well. Worse with motions of wrist, fingers. No skin changes.  11/11: Patient here for follow-up for lateral left elbow pain.  He reports continued 5/10 pain with use, sharp.  He does get some improvement while wearing the counterforce brace.  He is also found Mobic to be mildly helpful.  He tried using the nitro glycerin patches but states they cause that headaches so he discontinued.  She says he has been doing the home exercises but they have been painful. He is also been wearing a wrist brace and night for mild carpal tunnel symptoms.  He notes that overall this has been doing well.  He is interested in a steroid injection for his elbow.  Past Medical History:  Diagnosis Date  . Anginal pain ([place])   . Anxiety   . Arthritis   . Coronary artery disease, non-occlusive 08/2013   Mild to moderate (40% OM1) single vessel CAD. Otherwise nonobstructed.  . Depression   . Dyslipidemia, goal LDL below 100   . Essential hypertension   . Family history of premature CAD   . GERD (gastroesophageal reflux disease)   . H/O skin disorder    Involving hands. Subsequently resolved.   . Nonischemic cardiomyopathy ([place]) 05/2013   Non-ischemic: EF ~45% by Echo & Myoview --> Non-obstructive CAD  . Obesity (BMI 30.0-34.9)   . OSA (obstructive sleep apnea) 06/21/2017    Current Outpatient Medications on File Prior to Visit  Medication Sig Dispense Refill   . acetaminophen (TYLENOL) 650 MG CR tablet Take 650 mg by mouth every 8 (eight) hours as needed for pain.    Marland Kitchen aspirin EC 81 MG tablet Take 81 mg by mouth daily.    Marland Kitchen losartan (COZAAR) 100 MG tablet Take 0.5 tablets (50 mg total) by mouth daily. 45 tablet 1  . meloxicam (MOBIC) 15 MG tablet Take 1 tablet (15 mg total) by mouth daily. 30 tablet 0  . nitroGLYCERIN (NITROSTAT) 0.4 MG SL tablet Place 1 tablet (0.4 mg total) under the tongue every 5 (five) minutes as needed for chest pain. 30 tablet 1  . omeprazole (PRILOSEC OTC) 20 MG tablet Take 1 tablet (20 mg total) by mouth daily. 30 tablet   . pravastatin (PRAVACHOL) 40 MG tablet Take 1 tablet (40 mg total) by mouth daily. 30 tablet 5  . sertraline (ZOLOFT) 100 MG tablet Take 1 tablet (100 mg total) by mouth daily. 30 tablet 5  . sertraline (ZOLOFT) 50 MG tablet Take 1 tablet (50 mg total) by mouth daily. 30 tablet 5   No current facility-administered medications on file prior to visit.     Past Surgical History:  Procedure Laterality Date  . LEFT HEART CATHETERIZATION WITH CORONARY ANGIOGRAM  08/26/2013   Mild to moderate disease with 40-50% stenosis and OM1. Otherwise no significant CAD --  Surgeon: Leonie Man, MD;  Location: [facility] CATH LAB;  Service: Cardiovascular;;  . Lower extremity arterial Dopplers  06/11/2013  No occlusive disease  . LUMBAR LAMINECTOMY/DECOMPRESSION MICRODISCECTOMY Right 10/07/2014   Procedure: LUMBAR LAMINECTOMY/DECOMPRESSION MICRODISCECTOMY 1 LEVEL;  Surgeon: Kary Kos, MD;  Location: [place] NEURO ORS;  Service: Neurosurgery;  Laterality: Right;  Right L3-L4 Microdiscectomy  . NM MYOVIEW LTD  06/03/2013   Low risk study with no ischemia. EF roughly 44% with no regional wall motion abnormalities noted  . PFTs  06/2013   Normal Volumes &  Spirometry; Moderately reduced DLCO  . SHOULDER HEMI-ARTHROPLASTY Right 05/28/2015   Procedure: RIGHT SHOULDER HEMI-ARTHROPLASTY CTA HEAD AND SUBSCAP REPAIR ;  Surgeon: Netta Cedars, MD;  Location: [place];  Service: Orthopedics;  Laterality: Right;  . TRANSTHORACIC ECHOCARDIOGRAM  06/11/2013   Mildly reduced EF: 45-50%. Mild anterior hypokinesis with incoordinate septal motion. No evidence of pulmonary hypertension    Allergies  Allergen Reactions  . Isosorbide Nitrate Other (See Comments)    CONTINUOUS HEADACHE   . Oxycodone Itching    Reaction was to straight oxycodone 15 mg tablets - no reaction to percocet  . Penicillins Hives and Rash    Has patient had a PCN reaction causing immediate rash, facial/tongue/throat swelling, SOB or lightheadedness with hypotension: Yes Has patient had a PCN reaction causing severe rash involving mucus membranes or skin necrosis: No Has patient had a PCN reaction that required hospitalization No Has patient had a PCN reaction occurring within the last 10 years: No If all of the above answers are "NO", then may proceed with Cephalosporin use.    Social History   Socioeconomic History  . Marital status: Married    Spouse name: Not on file  . Number of children: Not on file  . Years of education: Not on file  . Highest education level: Not on file  Occupational History  . Not on file  Social Needs  . Financial resource strain: Not on file  . Food insecurity:    Worry: Not on file    Inability: Not on file  . Transportation needs:    Medical: Not on file    Non-medical: Not on file  Tobacco Use  . Smoking status: Former Smoker    Packs/day: 1.00    Years: 42.00    Pack years: 42.00    Types: Cigarettes    Last attempt to quit: 08/27/2017    Years since quitting: 0.7  . Smokeless tobacco: Never Used  Substance and Sexual Activity  . Alcohol use: Yes    Alcohol/week: 0.0 standard drinks    Comment: rarely  . Drug use: No  . Sexual activity: Yes    Partners: Female  Lifestyle  . Physical activity:    Days per week: Not on file    Minutes per session: Not on file  . Stress: Not on file  Relationships  .  Social connections:    Talks on phone: Not on file    Gets together: Not on file    Attends religious service: Not on file    Active member of club or organization: Not on file    Attends meetings of clubs or organizations: Not on file    Relationship status: Not on file  . Intimate partner violence:    Fear of current or ex partner: Not on file    Emotionally abused: Not on file    Physically abused: Not on file    Forced sexual activity: Not on file  Other Topics Concern  . Not on file  Social History Narrative   Married father of 2  boys. Lives with wife, [name], and one son.   He works as a Designer, television/film set for [employer]   He currently smokes one pack a day, cut down from 2 packs per day.   Only occasional beer and mixed drinks.   Does not exercise because he is too tired.    Family History  Problem Relation Age of Onset  . Atrial fibrillation Mother        Alive at 67  . COPD Mother   . Hypertension Mother   . Colonic polyp Mother   . Hypertension Father        Alive at 47  . Lung cancer Father        Chronic smoker  . Factor V Leiden deficiency Father        Also Lupus anticoagulant  . Heart attack Maternal Grandmother 48  . Heart failure Paternal Grandmother   . Diabetes Paternal Grandmother   . Heart attack Brother 25       X2  . Heart attack Sister 30       Deceased  . Healthy Sister        x2  . Healthy Son        x2  . Colon cancer Neg Hx   . Esophageal cancer Neg Hx   . Rectal cancer Neg Hx   . Stomach cancer Neg Hx     Ht 5\' 11"  (1.803 m)   Wt 240 lb (108.9 kg)   BMI 33.47 kg/m   Review of Systems: See HPI above.     Objective:  Physical Exam:  GEN: Awake, alert, no acute distress Pulmonary: No labored breathing  Left elbow: No obvious deformity, swelling, erythema Tenderness palpation over the lateral epicondyle Full range of motion of the elbow and wrist 5/5 strength.  Pain with  resisted wrist extension and supination, 3rd finger extension. N/V intact distally Negative Tinel's at the carpal tunnel on the left.  Right elbow: No deformity or swelling No tenderness Full range of motion with 5/5 strength N/V intact   Assessment & Plan:  1.  Left elbow pain-secondary to lateral epicondylitis. - Again recommended physical therapy which the patient declined today - After thoroughly discussing risks and benefits of steroid injection for lateral epicondylitis, patient wished to proceed with this today.  See below note - Diclofenac 75 mg twice daily as needed - Continue home exercises - Continue to wear braces as he has, counterforce brace - Follow-up 6 weeks  Procedure performed: Left lateral epicondylitis steroid injection  Consent obtained and verified. Time-out conducted. Noted no overlying erythema, induration, or other signs of local infection. The left common extensor tendon was palpated at the lateal epicondyle and PMT marked.  The overlying skin was prepped in a sterile fashion. Topical analgesic spray: Ethyl chloride. Needle: 25-gauge, 1.5 inch  Completed without difficulty. Meds: depomedrol 40mg , bupvicaine 2cc

## 2018-05-27 NOTE — Patient Instructions (Signed)
Wait 5-7 days before restarting your home exercises. Stop the meloxicam. Take diclofenac only as needed - hopefully you won't need this at all after today's shot. You were given a steroid injection. Call me if you're not improving - next step would be to do physical therapy. Follow up with me in 5-6 weeks.

## 2018-07-01 ENCOUNTER — Ambulatory Visit: Payer: Medicaid Other | Admitting: Pulmonary Disease

## 2018-07-01 ENCOUNTER — Encounter: Payer: Self-pay | Admitting: Pulmonary Disease

## 2018-07-01 VITALS — BP 114/74 | HR 74 | Ht 71.0 in | Wt 240.0 lb

## 2018-07-01 DIAGNOSIS — Z9989 Dependence on other enabling machines and devices: Secondary | ICD-10-CM | POA: Diagnosis not present

## 2018-07-01 DIAGNOSIS — G4733 Obstructive sleep apnea (adult) (pediatric): Secondary | ICD-10-CM

## 2018-07-01 NOTE — Patient Instructions (Signed)
Will arrange for CPAP mask refitting  Follow up in 1 year 

## 2018-07-01 NOTE — Progress Notes (Signed)
[facility]  Chief Complaint  Patient presents with  . Follow-up    Pt is doing well overall with cpap machine.    Constitutional:  BP 114/74 (BP Location: Left Arm, Cuff Size: Normal)   Pulse 74   Ht 5\' 11"  (1.803 m)   Wt 240 lb (108.9 kg)   SpO2 (!) 75%   BMI 33.47 kg/m   Past Medical History:  Anxiety, CAD, Depression, HLD, HtN, GERD, non ischemic CM  Brief Summary:  Charles Grimes is a 62 y.o. male with obstructive sleep apnea.  He is doing well with CPAP.  Has hybrid mask.  Wants to try full face mask.  Not having sinus congestion, sore throat, dry mouth, or aerophagia.  Gets up around 5 am.  He goes to bathroom.  He sometimes goes back to sleep for an hour, and doesn't put his mask back on.  Dreams more then.  Sleep is miserable if he doesn't use machine.   Physical Exam:   Appearance - well kempt   ENMT - clear nasal mucosa, midline nasal  septum, no oral exudates, no LAN, trachea midline, wears dentures  Respiratory - normal chest wall, normal respiratory effort, no accessory muscle use, no wheeze/rales  CV - s1s2 regular rate and rhythm, no murmurs, no peripheral edema, radial pulses symmetric  GI - soft, non tender, no masses  Lymph - no adenopathy noted in neck and axillary areas  MSK - normal gait  Ext - no cyanosis, clubbing, or joint inflammation noted  Skin - no rashes, lesions, or ulcers  Neuro - normal strength, oriented x 3  Psych - normal mood and affect  Assessment/Plan:   Obstructive sleep apnea. - he is compliant with CPAP and reports benefit - continue auto CPAP - discussed importance of using machine whenever he is asleep - will arrange for him to get refitted to full face mask   Patient Instructions  Will arrange for CPAP mask refitting  Follow up in 1 year    Chesley Mires, MD [place] Pager: [phone] 07/01/2018, 4:13 PM  Flow Sheet    Sleep tests:   PSG 04/12/17 >> AHI 13, SpO2 low 80% Auto CPAP 06/01/18 to 06/30/18 >> used on 28 of 30 nights with average 5 hrs 22 min.  Average AHI 4 with median CPAP 8 and 95 th percentile CPAP 12 cm H2O.  Medications:   Allergies as of 07/01/2018      Reactions   Isosorbide Nitrate Other (See Comments)   CONTINUOUS HEADACHE    Oxycodone Itching   Reaction was to straight oxycodone 15 mg tablets - no reaction to percocet   Penicillins Hives, Rash   Has patient had a PCN reaction causing immediate rash, facial/tongue/throat swelling, SOB or lightheadedness with hypotension: Yes Has patient had a PCN reaction causing severe rash involving mucus membranes or skin necrosis: No Has patient had a PCN reaction that required hospitalization No Has patient had a PCN reaction occurring within the last 10 years: No If all of the above answers are "NO", then may proceed with Cephalosporin use.      Medication List       Accurate as of July 01, 2018  4:13 PM. Always use your most recent med list.        acetaminophen 650 MG CR tablet Commonly known as:  TYLENOL Take 650 mg by mouth every 8 (eight) hours as needed for pain.   aspirin EC 81  MG tablet Take 81 mg by mouth daily.   losartan 100 MG tablet Commonly known as:  COZAAR Take 0.5 tablets (50 mg total) by mouth daily.   meloxicam 15 MG tablet Commonly known as:  MOBIC Take 1 tablet (15 mg total) by mouth daily.   nitroGLYCERIN 0.4 MG SL tablet Commonly known as:  NITROSTAT Place 1 tablet (0.4 mg total) under the tongue every 5 (five) minutes as needed for chest pain.   omeprazole 20 MG tablet Commonly known as:  PRILOSEC OTC Take 1 tablet (20 mg total) by mouth daily.   pravastatin 40 MG tablet Commonly known as:  PRAVACHOL Take 1 tablet (40 mg total) by mouth daily.   sertraline 50 MG tablet Commonly known as:  ZOLOFT Take 1 tablet (50 mg total) by mouth daily.   sertraline 100 MG tablet Commonly known as:  ZOLOFT Take 1  tablet (100 mg total) by mouth daily.       Past Surgical History:  He  has a past surgical history that includes NM MYOVIEW LTD (06/03/2013); transthoracic echocardiogram (06/11/2013); Lower extremity arterial Dopplers (06/11/2013); PFTs (06/2013); left heart catheterization with coronary angiogram (08/26/2013); Lumbar laminectomy/decompression microdiscectomy (Right, 10/07/2014); and Shoulder hemi-arthroplasty (Right, 05/28/2015).  Family History:  His family history includes Atrial fibrillation in his mother; COPD in his mother; Colonic polyp in his mother; Diabetes in his paternal grandmother; Factor V Leiden deficiency in his father; Healthy in his sister and son; Heart attack (age of onset: 35) in his maternal grandmother; Heart attack (age of onset: 87) in his sister; Heart attack (age of onset: 40) in his brother; Heart failure in his paternal grandmother; Hypertension in his father and mother; Lung cancer in his father.  Social History:  He  reports that he quit smoking about 10 months ago. His smoking use included cigarettes. He has a 42.00 pack-year smoking history. He has never used smokeless tobacco. He reports current alcohol use. He reports that he does not use drugs.

## 2018-07-15 ENCOUNTER — Ambulatory Visit: Payer: Medicaid Other | Admitting: Family Medicine

## 2018-07-22 ENCOUNTER — Other Ambulatory Visit: Payer: Self-pay

## 2018-07-22 MED ORDER — VARENICLINE TARTRATE 1 MG PO TABS: 1.0000 mg | ORAL_TABLET | Freq: Two times a day (BID) | ORAL | 0 refills | Status: DC

## 2018-08-21 ENCOUNTER — Ambulatory Visit (INDEPENDENT_AMBULATORY_CARE_PROVIDER_SITE_OTHER): Payer: Self-pay | Admitting: Family Medicine

## 2018-08-21 ENCOUNTER — Encounter: Payer: Self-pay | Admitting: Family Medicine

## 2018-08-21 VITALS — BP 122/78 | HR 65 | Temp 98.1°F | Ht 69.0 in | Wt 242.4 lb

## 2018-08-21 DIAGNOSIS — R0789 Other chest pain: Secondary | ICD-10-CM

## 2018-08-21 DIAGNOSIS — I2510 Atherosclerotic heart disease of native coronary artery without angina pectoris: Secondary | ICD-10-CM

## 2018-08-21 DIAGNOSIS — M7712 Lateral epicondylitis, left elbow: Secondary | ICD-10-CM

## 2018-08-21 MED ORDER — PRAVASTATIN SODIUM 40 MG PO TABS: 40.0000 mg | ORAL_TABLET | Freq: Every day | ORAL | 3 refills | Status: DC

## 2018-08-21 MED ORDER — MELOXICAM 15 MG PO TABS: 15.0000 mg | ORAL_TABLET | Freq: Every day | ORAL | 1 refills | Status: DC

## 2018-08-21 MED ORDER — LOSARTAN POTASSIUM 100 MG PO TABS: 50.0000 mg | ORAL_TABLET | Freq: Every day | ORAL | 3 refills | Status: DC

## 2018-08-21 NOTE — Progress Notes (Signed)
Musculoskeletal Exam  Patient: Charles Grimes DOB: 1956-02-17  DOS: 08/21/2018  SUBJECTIVE:  Chief Complaint:   Chief Complaint  Patient presents with  . Flank Pain    left side    Charles Grimes is a 63 y.o.  male for evaluation and treatment of L side pain.   Onset:  3 days ago. No inj or change in activity.  Location: L chest/side, post axillary line Character:  sharp  Progression of issue:  is unchanged Associated symptoms: hurts to take a deep breath, certain movements make it worse. Treatment: to date has been acetaminophen and heat.   Neurovascular symptoms: no  ROS: Musculoskeletal/Extremities: +side pain  Past Medical History:  Diagnosis Date  . Anginal pain ([place])   . Anxiety   . Arthritis   . Coronary artery disease, non-occlusive 08/2013   Mild to moderate (40% OM1) single vessel CAD. Otherwise nonobstructed.  . Depression   . Dyslipidemia, goal LDL below 100   . Essential hypertension   . Family history of premature CAD   . GERD (gastroesophageal reflux disease)   . H/O skin disorder    Involving hands. Subsequently resolved.   . Nonischemic cardiomyopathy ([place]) 05/2013   Non-ischemic: EF ~45% by Echo & Myoview --> Non-obstructive CAD  . Obesity (BMI 30.0-34.9)   . OSA (obstructive sleep apnea) 06/21/2017    Objective: VITAL SIGNS: BP 122/78 (BP Location: Left Arm, Patient Position: Sitting, Cuff Size: Large)   Pulse 65   Temp 98.1 F (36.7 C) (Oral)   Ht 5\' 9"  (1.753 m)   Wt 242 lb 6 oz (109.9 kg)   SpO2 96%   BMI 35.79 kg/m  Constitutional: Well formed, well developed. No acute distress. Thorax & Lungs: No accessory muscle use Musculoskeletal: Chest wall.   Tenderness to palpation: yes- post L rib angle at T5-6 Deformity: no Ecchymosis: no Crepitus: No Neurologic: Normal sensory function. No focal deficits noted. DTR's equal and symmetry in UE's. No clonus. Psychiatric: Normal mood. Age appropriate judgment and insight. Alert & oriented x  3.    Assessment:  Chest wall pain  Coronary artery disease, non-occlusive - Plan: losartan (COZAAR) 100 MG tablet, pravastatin (PRAVACHOL) 40 MG tablet  Lateral epicondylitis of left elbow - Plan: meloxicam (MOBIC) 15 MG tablet  Plan: Tylenol, ice heat, activity as tolerated. Mobic. Stretch area, take deep breaths.  Unlikely to be fx w lack of trauma. Unlikely to be lung related w lack of risk factors and nml SpO2, HR, RR.  F/u prn for this.  The patient voiced understanding and agreement to the plan.   [place], DO 08/21/18  7:21 AM

## 2018-08-21 NOTE — Patient Instructions (Addendum)
Ice/cold pack over area for 10-15 min twice daily.  Heat (pad or rice pillow in microwave) over affected area, 10-15 minutes twice daily.   OK to take Tylenol 1000 mg (2 extra strength tabs) or 975 mg (3 regular strength tabs) every 6 hours as needed.  Go back on your meloxicam.   Take deep breaths.  Stretch the area twice daily, hold for 30 seconds and repeat two times.  Stop chewing gum, drinking carbonated beverages, gulping liquids, and drinking alcohol to help with belching.  These foods may cause you to belch more: Wheat, barley, rye, onion, leek, white part of spring onion, garlic, shallots, artichokes, beetroot, fennel, peas, chicory, pistachio, cashews, legumes, lentils, and chickpeas; Milk, custard, ice cream, and yogurt; Apples, pears, mangoes, cherries, watermelon, asparagus, sugar snap peas, honey, high-fructose corn syrup; Apricots, nectarines, peaches, plums, mushrooms, cauliflower, artificially sweetened chewing gum and confectionery.  Let us know if you need anything.

## 2018-10-18 ENCOUNTER — Other Ambulatory Visit: Payer: Self-pay | Admitting: Family Medicine

## 2018-10-18 DIAGNOSIS — F329 Major depressive disorder, single episode, unspecified: Secondary | ICD-10-CM

## 2018-10-18 DIAGNOSIS — F32A Depression, unspecified: Secondary | ICD-10-CM

## 2018-10-18 DIAGNOSIS — F419 Anxiety disorder, unspecified: Principal | ICD-10-CM

## 2018-10-31 ENCOUNTER — Encounter: Payer: Self-pay | Admitting: Family Medicine

## 2018-11-01 ENCOUNTER — Ambulatory Visit: Payer: Medicaid Other | Admitting: Family Medicine

## 2018-11-01 ENCOUNTER — Other Ambulatory Visit: Payer: Self-pay

## 2018-11-17 ENCOUNTER — Other Ambulatory Visit: Payer: Self-pay | Admitting: Family Medicine

## 2018-11-17 DIAGNOSIS — F329 Major depressive disorder, single episode, unspecified: Secondary | ICD-10-CM

## 2018-11-17 DIAGNOSIS — F419 Anxiety disorder, unspecified: Principal | ICD-10-CM

## 2018-11-18 MED ORDER — SERTRALINE HCL 100 MG PO TABS: 100.0000 mg | ORAL_TABLET | Freq: Every day | ORAL | 0 refills | Status: DC

## 2018-11-18 MED ORDER — SERTRALINE HCL 50 MG PO TABS: 50.0000 mg | ORAL_TABLET | Freq: Every day | ORAL | 0 refills | Status: DC

## 2018-11-18 NOTE — Telephone Encounter (Signed)
Please advise 

## 2018-12-27 ENCOUNTER — Other Ambulatory Visit: Payer: Self-pay | Admitting: Family Medicine

## 2018-12-27 DIAGNOSIS — F419 Anxiety disorder, unspecified: Secondary | ICD-10-CM

## 2018-12-27 DIAGNOSIS — F329 Major depressive disorder, single episode, unspecified: Secondary | ICD-10-CM

## 2018-12-30 MED ORDER — SERTRALINE HCL 100 MG PO TABS: 100.0000 mg | ORAL_TABLET | Freq: Every day | ORAL | 7 refills | Status: DC

## 2018-12-30 MED ORDER — SERTRALINE HCL 50 MG PO TABS: 50.0000 mg | ORAL_TABLET | Freq: Every day | ORAL | 7 refills | Status: DC

## 2019-11-16 ENCOUNTER — Other Ambulatory Visit: Payer: Self-pay | Admitting: Family Medicine

## 2019-11-16 DIAGNOSIS — I2510 Atherosclerotic heart disease of native coronary artery without angina pectoris: Secondary | ICD-10-CM

## 2019-11-16 DIAGNOSIS — F329 Major depressive disorder, single episode, unspecified: Secondary | ICD-10-CM

## 2019-11-16 DIAGNOSIS — F419 Anxiety disorder, unspecified: Secondary | ICD-10-CM

## 2019-11-17 ENCOUNTER — Other Ambulatory Visit: Payer: Self-pay | Admitting: Family Medicine

## 2019-11-17 DIAGNOSIS — I2510 Atherosclerotic heart disease of native coronary artery without angina pectoris: Secondary | ICD-10-CM

## 2019-11-17 MED ORDER — LOSARTAN POTASSIUM 100 MG PO TABS: 50.0000 mg | ORAL_TABLET | Freq: Every day | ORAL | 0 refills | Status: DC

## 2020-01-22 ENCOUNTER — Emergency Department (HOSPITAL_BASED_OUTPATIENT_CLINIC_OR_DEPARTMENT_OTHER): Payer: Medicaid Other

## 2020-01-22 ENCOUNTER — Ambulatory Visit: Payer: Self-pay | Admitting: Nurse Practitioner

## 2020-01-22 ENCOUNTER — Ambulatory Visit
Admission: EM | Admit: 2020-01-22 | Discharge: 2020-01-22 | Disposition: A | Discharge status: Home / Self Care | Payer: Medicaid Other | Attending: Emergency Medicine | Admitting: Emergency Medicine

## 2020-01-22 ENCOUNTER — Other Ambulatory Visit: Payer: Self-pay

## 2020-01-22 ENCOUNTER — Encounter (HOSPITAL_BASED_OUTPATIENT_CLINIC_OR_DEPARTMENT_OTHER): Payer: Self-pay | Admitting: Emergency Medicine

## 2020-01-22 ENCOUNTER — Emergency Department (HOSPITAL_BASED_OUTPATIENT_CLINIC_OR_DEPARTMENT_OTHER)
Admission: EM | Admit: 2020-01-22 | Discharge: 2020-01-22 | Disposition: A | Discharge status: Home / Self Care | Payer: Medicaid Other | Attending: Emergency Medicine | Admitting: Emergency Medicine

## 2020-01-22 DIAGNOSIS — I2510 Atherosclerotic heart disease of native coronary artery without angina pectoris: Secondary | ICD-10-CM | POA: Insufficient documentation

## 2020-01-22 DIAGNOSIS — N50812 Left testicular pain: Secondary | ICD-10-CM | POA: Diagnosis present

## 2020-01-22 DIAGNOSIS — Z79899 Other long term (current) drug therapy: Secondary | ICD-10-CM | POA: Insufficient documentation

## 2020-01-22 DIAGNOSIS — I119 Hypertensive heart disease without heart failure: Secondary | ICD-10-CM | POA: Diagnosis not present

## 2020-01-22 DIAGNOSIS — N23 Unspecified renal colic: Secondary | ICD-10-CM | POA: Insufficient documentation

## 2020-01-22 DIAGNOSIS — R34 Anuria and oliguria: Secondary | ICD-10-CM | POA: Diagnosis not present

## 2020-01-22 DIAGNOSIS — Z87442 Personal history of urinary calculi: Secondary | ICD-10-CM | POA: Insufficient documentation

## 2020-01-22 DIAGNOSIS — F17210 Nicotine dependence, cigarettes, uncomplicated: Secondary | ICD-10-CM | POA: Diagnosis not present

## 2020-01-22 DIAGNOSIS — R1032 Left lower quadrant pain: Secondary | ICD-10-CM | POA: Diagnosis present

## 2020-01-22 DIAGNOSIS — R319 Hematuria, unspecified: Secondary | ICD-10-CM | POA: Diagnosis not present

## 2020-01-22 DIAGNOSIS — R310 Gross hematuria: Secondary | ICD-10-CM | POA: Insufficient documentation

## 2020-01-22 DIAGNOSIS — N200 Calculus of kidney: Secondary | ICD-10-CM | POA: Insufficient documentation

## 2020-01-22 DIAGNOSIS — R112 Nausea with vomiting, unspecified: Secondary | ICD-10-CM | POA: Diagnosis not present

## 2020-01-22 DIAGNOSIS — Z7982 Long term (current) use of aspirin: Secondary | ICD-10-CM | POA: Insufficient documentation

## 2020-01-22 LAB — CBC WITH DIFFERENTIAL/PLATELET
Abs Immature Granulocytes: 0.05 10*3/uL (ref 0.00–0.07)
Basophils Absolute: 0 10*3/uL (ref 0.0–0.1)
Basophils Relative: 0 %
Eosinophils Absolute: 0 10*3/uL (ref 0.0–0.5)
Eosinophils Relative: 0 %
HCT: 40.7 % (ref 39.0–52.0)
Hemoglobin: 13 g/dL (ref 13.0–17.0)
Immature Granulocytes: 0 %
Lymphocytes Relative: 12 %
Lymphs Abs: 1.3 10*3/uL (ref 0.7–4.0)
MCH: 26.6 pg (ref 26.0–34.0)
MCHC: 31.9 g/dL (ref 30.0–36.0)
MCV: 83.2 fL (ref 80.0–100.0)
Monocytes Absolute: 0.6 10*3/uL (ref 0.1–1.0)
Monocytes Relative: 6 %
Neutro Abs: 9.3 10*3/uL — ABNORMAL HIGH (ref 1.7–7.7)
Neutrophils Relative %: 82 %
Platelets: 198 10*3/uL (ref 150–400)
RBC: 4.89 MIL/uL (ref 4.22–5.81)
RDW: 15.8 % — ABNORMAL HIGH (ref 11.5–15.5)
WBC: 11.3 10*3/uL — ABNORMAL HIGH (ref 4.0–10.5)
nRBC: 0 % (ref 0.0–0.2)

## 2020-01-22 LAB — URINALYSIS, MICROSCOPIC (REFLEX): RBC / HPF: >50 RBC/hpf (ref 0–5)

## 2020-01-22 LAB — COMPREHENSIVE METABOLIC PANEL
ALT: 21 U/L (ref 0–44)
AST: 24 U/L (ref 15–41)
Albumin: 3.7 g/dL (ref 3.5–5.0)
Alkaline Phosphatase: 96 U/L (ref 38–126)
Anion gap: 10 (ref 5–15)
BUN: 18 mg/dL (ref 8–23)
CO2: 21 mmol/L — ABNORMAL LOW (ref 22–32)
Calcium: 8.5 mg/dL — ABNORMAL LOW (ref 8.9–10.3)
Chloride: 106 mmol/L (ref 98–111)
Creatinine, Ser: 1.01 mg/dL (ref 0.61–1.24)
GFR calc Af Amer: >60 mL/min (ref >60)
GFR calc non Af Amer: >60 mL/min (ref >60)
Glucose, Bld: 140 mg/dL — ABNORMAL HIGH (ref 70–99)
Potassium: 3.9 mmol/L (ref 3.5–5.1)
Sodium: 137 mmol/L (ref 135–145)
Total Bilirubin: 0.6 mg/dL (ref 0.3–1.2)
Total Protein: 7.4 g/dL (ref 6.5–8.1)

## 2020-01-22 LAB — URINALYSIS, ROUTINE W REFLEX MICROSCOPIC
Glucose, UA: 100 mg/dL — AB
Ketones, ur: NEGATIVE mg/dL
Leukocytes,Ua: NEGATIVE
Nitrite: POSITIVE — AB
Protein, ur: 100 mg/dL — AB
Specific Gravity, Urine: >1.03 — ABNORMAL HIGH (ref 1.005–1.030)
pH: 5 (ref 5.0–8.0)

## 2020-01-22 LAB — POCT URINALYSIS DIP (MANUAL ENTRY)
Glucose, UA: 100 mg/dL — AB
Ketones, POC UA: NEGATIVE mg/dL
Leukocytes, UA: NEGATIVE
Nitrite, UA: POSITIVE — AB
Protein Ur, POC: 100 mg/dL — AB
Spec Grav, UA: 1.025 (ref 1.010–1.025)
Urobilinogen, UA: 1 E.U./dL
pH, UA: 5 (ref 5.0–8.0)

## 2020-01-22 MED ORDER — TAMSULOSIN HCL 0.4 MG PO CAPS: 0.4000 mg | ORAL_CAPSULE | Freq: Every day | ORAL | 0 refills | Status: DC

## 2020-01-22 MED ORDER — SODIUM CHLORIDE 0.9 % IV BOLUS
1000.0000 mL | Freq: Once | INTRAVENOUS | Status: AC
  Administered 2020-01-22: 1000 mL via INTRAVENOUS

## 2020-01-22 MED ORDER — KETOROLAC TROMETHAMINE 30 MG/ML IJ SOLN
30.0000 mg | Freq: Once | INTRAMUSCULAR | Status: AC
  Administered 2020-01-22: 30 mg via INTRAMUSCULAR

## 2020-01-22 MED ORDER — ONDANSETRON 4 MG PO TBDP
4.0000 mg | ORAL_TABLET | Freq: Three times a day (TID) | ORAL | 0 refills | Status: DC | PRN
Start: 2020-01-22 — End: 2020-06-18

## 2020-01-22 MED ORDER — HYDROCODONE-ACETAMINOPHEN 5-325 MG PO TABS: 2.0000 | ORAL_TABLET | ORAL | 0 refills | Status: DC | PRN

## 2020-01-22 MED ORDER — ONDANSETRON 4 MG PO TBDP
4.0000 mg | ORAL_TABLET | Freq: Once | ORAL | Status: AC
  Administered 2020-01-22: 4 mg via ORAL

## 2020-01-22 MED FILL — ONDANSETRON ODT 4MG TBDP: 4 | 7 days supply | Qty: 20 | Fill #0

## 2020-01-22 MED FILL — HYDROCODON-APAP 5-325: 5-325 | 1 days supply | Qty: 8 | Fill #0

## 2020-01-22 MED FILL — TAMSULOSIN HCL 0.4 MG CAP: 0.4 | 14 days supply | Qty: 14 | Fill #0

## 2020-01-22 NOTE — ED Notes (Signed)
Unable to void at this time.

## 2020-01-22 NOTE — ED Notes (Signed)
ED Provider at bedside. 

## 2020-01-22 NOTE — Discharge Instructions (Addendum)
Patient given 4 mg Zofran ODT, 30 mg Toradol IM prior to discharge.

## 2020-01-22 NOTE — ED Triage Notes (Addendum)
L testicle pain and swelling since early this morning. Pain radiates into L flank. Also reported hematuria this morning. Sent from [facility]. Pt had IM Toradol at [facility].

## 2020-01-22 NOTE — ED Provider Notes (Signed)
[facility]    CSN: [id number] Arrival date & time: 01/22/20  1037      History   Chief Complaint Chief Complaint  Patient presents with  . Flank Pain    HPI Charles Grimes is a 64 y.o. male with history of CAD, hypertension, obesity, OSA presenting with his wife for evaluation of left flank pain, LLQ pain, nausea, vomiting, hematuria, decreased urine output.  States this began Sunday.  Has noticed blood clots as well as bright red blood.  Denies penile pain, discharge.  Does endorse left-sided testicular pain without swelling.  Denies history of inguinal hernia, diverticulitis.  Does note history of BPH, renal calculi (last one approximately 5 years ago, nonobstructive).  No hematochezia, melena, though has had difficulty passing stool with associated rectal pain.  No fever, arthralgias, myalgias, chest pain, shortness of breath.  Has been pushing fluids (water, cranberry juice) without relief.   Past Medical History:  Diagnosis Date  . Anginal pain ([place])   . Anxiety   . Arthritis   . Coronary artery disease, non-occlusive 08/2013   Mild to moderate (40% OM1) single vessel CAD. Otherwise nonobstructed.  . Depression   . Dyslipidemia, goal LDL below 100   . Essential hypertension   . Family history of premature CAD   . GERD (gastroesophageal reflux disease)   . H/O skin disorder    Involving hands. Subsequently resolved.   . Nonischemic cardiomyopathy ([place]) 05/2013   Non-ischemic: EF ~45% by Echo & Myoview --> Non-obstructive CAD  . Obesity (BMI 30.0-34.9)   . OSA (obstructive sleep apnea) 06/21/2017    Patient Active Problem List   Diagnosis Date Noted  . Tobacco abuse 02/25/2018  . Lateral epicondylitis of left elbow 02/25/2018  . OSA (obstructive sleep apnea) 06/21/2017  . History of colon polyps 12/29/2016  . 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  . HNP (herniated nucleus pulposus), lumbar 10/07/2014  . Prostate cancer screening 06/10/2014  . Visit for  preventive health examination 06/10/2014  . Fall at home 05/16/2014  . Anxiety and depression 01/11/2014  . Colon cancer screening 01/11/2014  . Former heavy cigarette smoker (20-39 per day) 09/18/2013  . Coronary artery disease, non-occlusive 08/17/2013  . Mild essential hypertension 07/28/2013  . Dyslipidemia, goal LDL below 100 07/28/2013  . Family history of premature CAD   . Family history of factor V Leiden deficiency 06/17/2013  . Obesity (BMI 30-39.9) 05/25/2013  . Shortness of breath on exertion 05/23/2013  . Intermittent claudication ([place]) 05/23/2013  . Nonischemic cardiomyopathy - mild 05/17/2013    Past Surgical History:  Procedure Laterality Date  . LEFT HEART CATHETERIZATION WITH CORONARY ANGIOGRAM  08/26/2013   Mild to moderate disease with 40-50% stenosis and OM1. Otherwise no significant CAD --  Surgeon: Leonie Man, MD;  Location: [facility] CATH LAB;  Service: Cardiovascular;;  . Lower extremity arterial Dopplers  06/11/2013   No occlusive disease  . LUMBAR LAMINECTOMY/DECOMPRESSION MICRODISCECTOMY Right 10/07/2014   Procedure: LUMBAR LAMINECTOMY/DECOMPRESSION MICRODISCECTOMY 1 LEVEL;  Surgeon: Kary Kos, MD;  Location: [place] NEURO ORS;  Service: Neurosurgery;  Laterality: Right;  Right L3-L4 Microdiscectomy  . NM MYOVIEW LTD  06/03/2013   Low risk study with no ischemia. EF roughly 44% with no regional wall motion abnormalities noted  . PFTs  06/2013   Normal Volumes &  Spirometry; Moderately reduced DLCO  . SHOULDER HEMI-ARTHROPLASTY Right 05/28/2015   Procedure: RIGHT SHOULDER HEMI-ARTHROPLASTY CTA HEAD AND SUBSCAP REPAIR ;  Surgeon: Netta Cedars, MD;  Location: [facility]  OR;  Service: Orthopedics;  Laterality: Right;  . TRANSTHORACIC ECHOCARDIOGRAM  06/11/2013   Mildly reduced EF: 45-50%. Mild anterior hypokinesis with incoordinate septal motion. No evidence of pulmonary hypertension       Home Medications    Prior to Admission medications   Medication Sig Start Date  End Date Taking? Authorizing Provider  acetaminophen (TYLENOL) 650 MG CR tablet Take 650 mg by mouth every 8 (eight) hours as needed for pain.    [provider]  aspirin EC 81 MG tablet Take 81 mg by mouth daily.    [provider]  losartan (COZAAR) 100 MG tablet Take 0.5 tablets (50 mg total) by mouth daily. 11/17/19   [name], DO  nitroGLYCERIN (NITROSTAT) 0.4 MG SL tablet Place 1 tablet (0.4 mg total) under the tongue every 5 (five) minutes as needed for chest pain. 02/25/18   [name], DO  omeprazole (PRILOSEC OTC) 20 MG tablet Take 1 tablet (20 mg total) by mouth daily. 06/20/17   [name], DO  pravastatin (PRAVACHOL) 40 MG tablet Take 1 tablet by mouth once daily 11/17/19   [name], DO  sertraline (ZOLOFT) 50 MG tablet Take 1 tablet (50 mg total) by mouth daily. Take for a total of 150 mg daily. 12/30/18   [name], DO    Family History Family History  Problem Relation Age of Onset  . Atrial fibrillation Mother        Alive at 14  . COPD Mother   . Hypertension Mother   . Colonic polyp Mother   . Hypertension Father        Alive at 74  . Lung cancer Father        Chronic smoker  . Factor V Leiden deficiency Father        Also Lupus anticoagulant  . Heart attack Maternal Grandmother 48  . Heart failure Paternal Grandmother   . Diabetes Paternal Grandmother   . Heart attack Brother 51       X2  . Heart attack Sister 50       Deceased  . Healthy Sister        x2  . Healthy Son        x2  . Colon cancer Neg Hx   . Esophageal cancer Neg Hx   . Rectal cancer Neg Hx   . Stomach cancer Neg Hx     Social History Social History   Tobacco Use  . Smoking status: Current Every Day Smoker    Packs/day: 1.00    Years: 42.00    Pack years: 42.00    Types: Cigarettes    Last attempt to quit: 08/27/2017    Years since quitting: 2.4  . Smokeless tobacco: Never Used  Vaping Use  . Vaping  Use: Never used  Substance Use Topics  . Alcohol use: Yes    Alcohol/week: 0.0 standard drinks    Comment: rarely  . Drug use: No     Allergies   Isosorbide nitrate, Oxycodone, and Penicillins   Review of Systems As per HPI   Physical Exam Triage Vital Signs ED Triage Vitals  Enc Vitals Group     BP      Pulse      Resp      Temp      Temp src      SpO2      Weight      Height  Head Circumference      Peak Flow      Pain Score      Pain Loc      Pain Edu?      Excl. in [place]?    No data found.  Updated Vital Signs BP (!) 148/84 (BP Location: Left Arm)   Pulse 64   Temp 98 F (36.7 C) (Oral)   Resp 18   SpO2 98%   Visual Acuity Right Eye Distance:   Left Eye Distance:   Bilateral Distance:    Right Eye Near:   Left Eye Near:    Bilateral Near:     Physical Exam Constitutional:      General: He is not in acute distress. HENT:     Head: Normocephalic and atraumatic.  Eyes:     General: No scleral icterus.    Pupils: Pupils are equal, round, and reactive to light.  Cardiovascular:     Rate and Rhythm: Normal rate.  Pulmonary:     Effort: Pulmonary effort is normal. No respiratory distress.     Breath sounds: No wheezing.  Abdominal:     General: Bowel sounds are normal. There is distension.     Tenderness: There is abdominal tenderness. There is left CVA tenderness and guarding. There is no right CVA tenderness or rebound.    Genitourinary:    Comments: Pt declined Skin:    Coloration: Skin is not jaundiced or pale.  Neurological:     Mental Status: He is alert and oriented to person, place, and time.      UC Treatments / Results  Labs (all labs ordered are listed, but only abnormal results are displayed) Labs Reviewed  POCT URINALYSIS DIP (MANUAL ENTRY) - Abnormal; Notable for the following components:      Result Value   Color, UA orange (*)    Glucose, UA =100 (*)    Bilirubin, UA small (*)    Blood, UA large (*)    Protein  Ur, POC =100 (*)    Nitrite, UA Positive (*)    All other components within normal limits  URINE CULTURE    EKG   Radiology No results found.  Procedures Procedures (including critical care time)  Medications Ordered in UC Medications  ondansetron (ZOFRAN-ODT) disintegrating tablet 4 mg (4 mg Oral Given 01/22/20 1109)  ketorolac (TORADOL) 30 MG/ML injection 30 mg (30 mg Intramuscular Given 01/22/20 1110)    Initial Impression / Assessment and Plan / UC Course  I have reviewed the triage vital signs and the nursing notes.  Pertinent labs & imaging results that were available during my care of the patient were reviewed by me and considered in my medical decision making (see chart for details).     Patient afebrile, nontoxic in office today.  Patient is in significant pain with low urine output and hematuria.  Urine dipstick positive for glucose, bilirubin, blood, protein, nitrates.  Urine culture pending.  Discussed concern for acute prostatitis, renal calculi, diverticulitis.  Most concerning is decreased urine output with significant pain despite adequate intake as this could indicate partial versus complete obstruction of urinary tract.  Patient and wife electing to go to ER for further evaluation: Doing so in personal vehicle in stable condition.  Return precautions discussed, patient verbalized understanding and is agreeable to plan. Final Clinical Impressions(s) / UC Diagnoses   Final diagnoses:  Renal colic on left side  Decreased urine output  Non-intractable vomiting with nausea, unspecified vomiting type  Personal history of renal calculi  Left testicular pain  Gross hematuria     Discharge Instructions     Patient given 4 mg Zofran ODT, 30 mg Toradol IM prior to discharge.    ED Prescriptions    None     PDMP not reviewed this encounter.   [place], [place], [place] 01/22/20 1113

## 2020-01-22 NOTE — ED Triage Notes (Signed)
Pt states he thinks he passed a kidney stone on Sunday when he urinated a large amount of blood. States since last night he is having severe lt flank pain radiating to lt testicle, vomit x1 due to pain. States took a tylenol with no relief.

## 2020-01-22 NOTE — ED Provider Notes (Signed)
[facility] EMERGENCY DEPARTMENT Provider Note   CSN: [id number] Arrival date & time: 01/22/20  1136     History Chief Complaint  Patient presents with  . Testicle Pain    Charles Grimes is a 64 y.o. male.  HPI     64 year old male with history below for left flank pain.  Reports on Sunday he woke up with a mild ache in his bilateral back and hematuria, but has had progression of pain since then.  Reports he now has severe flank pain radiating to the left lower abdomen into left testicle.  He could not sleep at all last night due to pain and with he received Toradol at urgent care, reports significant improvement in his pain since then.  Denies fevers, dysuria, diarrhea, constipation.   Past Medical History:  Diagnosis Date  . Anginal pain ([place])   . Anxiety   . Arthritis   . Coronary artery disease, non-occlusive 08/2013   Mild to moderate (40% OM1) single vessel CAD. Otherwise nonobstructed.  . Depression   . Dyslipidemia, goal LDL below 100   . Essential hypertension   . Family history of premature CAD   . GERD (gastroesophageal reflux disease)   . H/O skin disorder    Involving hands. Subsequently resolved.   . Nonischemic cardiomyopathy ([place]) 05/2013   Non-ischemic: EF ~45% by Echo & Myoview --> Non-obstructive CAD  . Obesity (BMI 30.0-34.9)   . OSA (obstructive sleep apnea) 06/21/2017    Patient Active Problem List   Diagnosis Date Noted  . Tobacco abuse 02/25/2018  . Lateral epicondylitis of left elbow 02/25/2018  . OSA (obstructive sleep apnea) 06/21/2017  . History of colon polyps 12/29/2016  . 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  . HNP (herniated nucleus pulposus), lumbar 10/07/2014  . Prostate cancer screening 06/10/2014  . Visit for preventive health examination 06/10/2014  . Fall at home 05/16/2014  . Anxiety and depression 01/11/2014  . Colon cancer screening 01/11/2014  . Former heavy cigarette smoker (20-39 per day) 09/18/2013  .  Coronary artery disease, non-occlusive 08/17/2013  . Mild essential hypertension 07/28/2013  . Dyslipidemia, goal LDL below 100 07/28/2013  . Family history of premature CAD   . Family history of factor V Leiden deficiency 06/17/2013  . Obesity (BMI 30-39.9) 05/25/2013  . Shortness of breath on exertion 05/23/2013  . Intermittent claudication ([place]) 05/23/2013  . Nonischemic cardiomyopathy - mild 05/17/2013    Past Surgical History:  Procedure Laterality Date  . LEFT HEART CATHETERIZATION WITH CORONARY ANGIOGRAM  08/26/2013   Mild to moderate disease with 40-50% stenosis and OM1. Otherwise no significant CAD --  Surgeon: Leonie Man, MD;  Location: [facility] CATH LAB;  Service: Cardiovascular;;  . Lower extremity arterial Dopplers  06/11/2013   No occlusive disease  . LUMBAR LAMINECTOMY/DECOMPRESSION MICRODISCECTOMY Right 10/07/2014   Procedure: LUMBAR LAMINECTOMY/DECOMPRESSION MICRODISCECTOMY 1 LEVEL;  Surgeon: Kary Kos, MD;  Location: [place] NEURO ORS;  Service: Neurosurgery;  Laterality: Right;  Right L3-L4 Microdiscectomy  . NM MYOVIEW LTD  06/03/2013   Low risk study with no ischemia. EF roughly 44% with no regional wall motion abnormalities noted  . PFTs  06/2013   Normal Volumes &  Spirometry; Moderately reduced DLCO  . SHOULDER HEMI-ARTHROPLASTY Right 05/28/2015   Procedure: RIGHT SHOULDER HEMI-ARTHROPLASTY CTA HEAD AND SUBSCAP REPAIR ;  Surgeon: Netta Cedars, MD;  Location: [place];  Service: Orthopedics;  Laterality: Right;  . TRANSTHORACIC ECHOCARDIOGRAM  06/11/2013   Mildly reduced EF: 45-50%. Mild anterior hypokinesis  with incoordinate septal motion. No evidence of pulmonary hypertension       Family History  Problem Relation Age of Onset  . Atrial fibrillation Mother        Alive at 18  . COPD Mother   . Hypertension Mother   . Colonic polyp Mother   . Hypertension Father        Alive at 28  . Lung cancer Father        Chronic smoker  . Factor V Leiden deficiency Father          Also Lupus anticoagulant  . Heart attack Maternal Grandmother 48  . Heart failure Paternal Grandmother   . Diabetes Paternal Grandmother   . Heart attack Brother 73       X2  . Heart attack Sister 75       Deceased  . Healthy Sister        x2  . Healthy Son        x2  . Colon cancer Neg Hx   . Esophageal cancer Neg Hx   . Rectal cancer Neg Hx   . Stomach cancer Neg Hx     Social History   Tobacco Use  . Smoking status: Current Every Day Smoker    Packs/day: 1.00    Years: 42.00    Pack years: 42.00    Types: Cigarettes    Last attempt to quit: 08/27/2017    Years since quitting: 2.4  . Smokeless tobacco: Never Used  Vaping Use  . Vaping Use: Never used  Substance Use Topics  . Alcohol use: Yes    Alcohol/week: 0.0 standard drinks    Comment: rarely  . Drug use: No    Home Medications Prior to Admission medications   Medication Sig Start Date End Date Taking? Authorizing Provider  aspirin EC 81 MG tablet Take 81 mg by mouth daily.   Yes [provider]  losartan (COZAAR) 100 MG tablet Take 0.5 tablets (50 mg total) by mouth daily. 11/17/19  Yes [name], DO  nitroGLYCERIN (NITROSTAT) 0.4 MG SL tablet Place 1 tablet (0.4 mg total) under the tongue every 5 (five) minutes as needed for chest pain. 02/25/18  Yes [name], DO  omeprazole (PRILOSEC OTC) 20 MG tablet Take 1 tablet (20 mg total) by mouth daily. 06/20/17  Yes [name], DO  pravastatin (PRAVACHOL) 40 MG tablet Take 1 tablet by mouth once daily 11/17/19  Yes [place], Crosby Oyster, DO  sertraline (ZOLOFT) 50 MG tablet Take 1 tablet (50 mg total) by mouth daily. Take for a total of 150 mg daily. 12/30/18  Yes [name], DO  acetaminophen (TYLENOL) 650 MG CR tablet Take 650 mg by mouth every 8 (eight) hours as needed for pain.    [provider]  HYDROcodone-acetaminophen (NORCO/VICODIN) 5-325 MG tablet Take 2 tablets by mouth every 4  (four) hours as needed. 01/22/20   Gareth Morgan, MD  ondansetron (ZOFRAN ODT) 4 MG disintegrating tablet Take 1 tablet (4 mg total) by mouth every 8 (eight) hours as needed for nausea or vomiting. 01/22/20   Gareth Morgan, MD  tamsulosin (FLOMAX) 0.4 MG CAPS capsule Take 1 capsule (0.4 mg total) by mouth daily for 14 days. 01/22/20 02/05/20  Gareth Morgan, MD    Allergies    Isosorbide nitrate, Oxycodone, and Penicillins  Review of Systems   Review of Systems  Constitutional: Negative for fever.  Eyes: Negative for visual disturbance.  Respiratory: Negative  for shortness of breath.   Cardiovascular: Negative for chest pain.  Gastrointestinal: Positive for abdominal pain, nausea and vomiting. Negative for constipation and diarrhea.  Genitourinary: Positive for flank pain and hematuria. Negative for difficulty urinating.  Musculoskeletal: Positive for back pain. Negative for neck stiffness.  Skin: Negative for rash.  Neurological: Negative for syncope and headaches.    Physical Exam Updated Vital Signs BP 139/79 (BP Location: Right Arm)   Pulse 64   Temp 97.7 F (36.5 C) (Oral)   Resp 16   Ht 5\' 11"  (1.803 m)   Wt 95.3 kg   SpO2 98%   BMI 29.29 kg/m   Physical Exam Vitals and nursing note reviewed.  Constitutional:      General: He is not in acute distress.    Appearance: Normal appearance. He is not ill-appearing, toxic-appearing or diaphoretic.  HENT:     Head: Normocephalic.  Eyes:     Conjunctiva/sclera: Conjunctivae normal.  Cardiovascular:     Rate and Rhythm: Normal rate and regular rhythm.     Pulses: Normal pulses.  Pulmonary:     Effort: Pulmonary effort is normal. No respiratory distress.  Abdominal:     General: Abdomen is flat.     Tenderness: There is abdominal tenderness (LLQ, l testicular pain). There is left CVA tenderness.  Musculoskeletal:        General: No deformity or signs of injury.     Cervical back: No rigidity.  Skin:    General: Skin  is warm and dry.     Coloration: Skin is not jaundiced or pale.  Neurological:     General: No focal deficit present.     Mental Status: He is alert and oriented to person, place, and time.     ED Results / Procedures / Treatments   Labs (all labs ordered are listed, but only abnormal results are displayed) Labs Reviewed  CBC WITH DIFFERENTIAL/PLATELET - Abnormal; Notable for the following components:      Result Value   WBC 11.3 (*)    RDW 15.8 (*)    Neutro Abs 9.3 (*)    All other components within normal limits  COMPREHENSIVE METABOLIC PANEL - Abnormal; Notable for the following components:   CO2 21 (*)    Glucose, Bld 140 (*)    Calcium 8.5 (*)    All other components within normal limits  URINALYSIS, ROUTINE W REFLEX MICROSCOPIC - Abnormal; Notable for the following components:   Color, Urine ORANGE (*)    APPearance CLOUDY (*)    Specific Gravity, Urine >1.030 (*)    Glucose, UA 100 (*)    Hgb urine dipstick LARGE (*)    Bilirubin Urine SMALL (*)    Protein, ur 100 (*)    Nitrite POSITIVE (*)    All other components within normal limits  URINALYSIS, MICROSCOPIC (REFLEX) - Abnormal; Notable for the following components:   Bacteria, UA MANY (*)    All other components within normal limits  URINE CULTURE    EKG None  Radiology CT Renal Stone Study  Result Date: 01/22/2020 CLINICAL DATA:  Pain radiates into L flank. Also reported hematuria this morning. Hx of kidney stones EXAM: CT ABDOMEN AND PELVIS WITHOUT CONTRAST TECHNIQUE: Multidetector CT imaging of the abdomen and pelvis was performed following the standard protocol without IV contrast. COMPARISON:  CT abdomen pelvis 05/16/2014 FINDINGS: Lower chest: Mild ground-glass opacities in the right greater than left lung bases, favored to represent atelectasis. Stable benign small subpleural  nodule in the anterior right lung (series 2, image 4). Coronary artery calcification. Hepatobiliary: No focal liver abnormality is  seen. No gallstones, gallbladder wall thickening, or biliary dilatation. Pancreas: Unremarkable. No surrounding inflammatory changes. Spleen: Normal in size without focal abnormality. Adrenals/Urinary Tract: Adrenal glands are unremarkable. There is left perinephric and Peri ureteral stranding. There is moderate left hydronephrosis secondary to an obstructing stone in the mid left ureter measuring 4 mm (series 2, image 55). Punctate right renal calculus. No right hydronephrosis. Urinary bladder is unremarkable. Stomach/Bowel: Stomach is within normal limits. Appendix appears normal. No evidence of bowel wall thickening, distention, or inflammatory changes. Vascular/Lymphatic: Mild aortic atherosclerosis. Vascular patency cannot be assessed in the absence of IV contrast. No lymphadenopathy. Chest is Reproductive: Prostate is unremarkable. Other: No abdominal wall hernia or abnormality. No abdominopelvic ascites. Musculoskeletal: Chronic L4 wedge compression deformity. No acute osseous finding. IMPRESSION: 1. 4 mm obstructing stone in the mid left ureter with moderate left hydronephrosis and perinephric/she has periureteral stranding. 2. Punctate right renal calculus. 3. Mild ground-glass opacities in the right greater than left lung bases, favored to represent atelectasis. 4. Aortic atherosclerosis. Aortic Atherosclerosis (ICD10-I70.0). Electronically Signed   By: Audie Pinto M.D.   On: 01/22/2020 13:21    Procedures Procedures (including critical care time)  Medications Ordered in ED Medications  sodium chloride 0.9 % bolus 1,000 mL (0 mLs Intravenous Stopped 01/22/20 1346)    ED Course  I have reviewed the triage vital signs and the nursing notes.  Pertinent labs & imaging results that were available during my care of the patient were reviewed by me and considered in my medical decision making (see chart for details).    MDM Rules/Calculators/A&P                          [age] year old male  with history above presents with concern for left flank pain radiating to the abdomen and groin.  CT shows 4 mm ureteral stone.  Urinalysis is positive for nitrites, but has significant hematuria which may be affecting results.  No fevers, no significant white blood cells in urine, will send urine for culture given low clinical suspicion for infection at this time.  Renal function within normal limits.  His pain has been well controlled since receiving Toradol at urgent care.  Given prescription for Flomax, Norco, Zofran and recommend ibuprofen for pain.  Reviewed in [place] drug database and discussed risks of narcotic prescription. Patient discharged in stable condition with understanding of reasons to return.    Final Clinical Impression(s) / ED Diagnoses Final diagnoses:  Nephrolithiasis    Rx / DC Orders ED Discharge Orders         Ordered    tamsulosin (FLOMAX) 0.4 MG CAPS capsule  Daily     Discontinue  Reprint     01/22/20 1359    ondansetron (ZOFRAN ODT) 4 MG disintegrating tablet  Every 8 hours PRN     Discontinue  Reprint     01/22/20 1359    HYDROcodone-acetaminophen (NORCO/VICODIN) 5-325 MG tablet  Every 4 hours PRN     Discontinue  Reprint     01/22/20 [place], Genevieve Arbaugh, MD 01/22/20 1858

## 2020-01-23 LAB — URINE CULTURE
Culture: NO GROWTH
Culture: NO GROWTH

## 2020-01-24 ENCOUNTER — Ambulatory Visit
Admission: EM | Admit: 2020-01-24 | Discharge: 2020-01-24 | Disposition: A | Discharge status: Home / Self Care | Payer: Medicaid Other | Attending: Physician Assistant | Admitting: Physician Assistant

## 2020-01-24 ENCOUNTER — Other Ambulatory Visit: Payer: Self-pay

## 2020-01-24 ENCOUNTER — Ambulatory Visit: Payer: Self-pay

## 2020-01-24 DIAGNOSIS — N211 Calculus in urethra: Secondary | ICD-10-CM

## 2020-01-24 MED ORDER — KETOROLAC TROMETHAMINE 15 MG/ML IJ SOLN
15.0000 mg | Freq: Once | INTRAMUSCULAR | Status: AC
  Administered 2020-01-24: 15 mg via INTRAMUSCULAR

## 2020-01-24 MED ORDER — KETOROLAC TROMETHAMINE 10 MG PO TABS: 10.0000 mg | ORAL_TABLET | Freq: Four times a day (QID) | ORAL | 0 refills | Status: DC | PRN

## 2020-01-24 NOTE — Discharge Instructions (Signed)
Toradol injection in office today. Start toradol tablets as needed 8 hours after the injection. Continue the other medicines prescribed by the ED. Keep hydrated, urine should be clear to pale yellow in color. If having worsening pain, vomiting, unable to urinate, fever, go to the emergency department for further evaluation.

## 2020-01-24 NOTE — ED Triage Notes (Signed)
Patient presents back to the UC with continued left flank pain from kidney stone.

## 2020-01-24 NOTE — ED Provider Notes (Signed)
[facility]    CSN: [id number] Arrival date & time: 01/24/20  1347      History   Chief Complaint Chief Complaint  Patient presents with   Nephrolithiasis   Flank Pain    HPI Charles Grimes is a 64 y.o. male.   64 year old male comes in for continued left flank pain after being diagnosed with nephrolithiasis 2 days ago.  At the time, Toradol injection in urgent care improved symptoms.  Due to pain to the testicles, he went to the ED for further evaluation, CT renal showing 4 mm obstructing stone in the mid left ureter with moderate left hydronephrosis.  Was provided with Flomax and Norco with mild relief of symptoms.  Denies worsening of symptoms.  States Toradol had controlled symptoms better.  Denies nausea, vomiting, fever.  Still urinating adequately.     Past Medical History:  Diagnosis Date   Anginal pain ([place])    Anxiety    Arthritis    Coronary artery disease, non-occlusive 08/2013   Mild to moderate (40% OM1) single vessel CAD. Otherwise nonobstructed.   Depression    Dyslipidemia, goal LDL below 100    Essential hypertension    Family history of premature CAD    GERD (gastroesophageal reflux disease)    H/O skin disorder    Involving hands. Subsequently resolved.    Nonischemic cardiomyopathy ([place]) 05/2013   Non-ischemic: EF ~45% by Echo & Myoview --> Non-obstructive CAD   Obesity (BMI 30.0-34.9)    OSA (obstructive sleep apnea) 06/21/2017    Patient Active Problem List   Diagnosis Date Noted   Tobacco abuse 02/25/2018   Lateral epicondylitis of left elbow 02/25/2018   OSA (obstructive sleep apnea) 06/21/2017   History of colon polyps 12/29/2016   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   HNP (herniated nucleus pulposus), lumbar 10/07/2014   Prostate cancer screening 06/10/2014   Visit for preventive health examination 06/10/2014   Fall at home 05/16/2014   Anxiety and depression 01/11/2014   Colon cancer screening  01/11/2014   Former heavy cigarette smoker (20-39 per day) 09/18/2013   Coronary artery disease, non-occlusive 08/17/2013   Mild essential hypertension 07/28/2013   Dyslipidemia, goal LDL below 100 07/28/2013   Family history of premature CAD    Family history of factor V Leiden deficiency 06/17/2013   Obesity (BMI 30-39.9) 05/25/2013   Shortness of breath on exertion 05/23/2013   Intermittent claudication ([place]) 05/23/2013   Nonischemic cardiomyopathy - mild 05/17/2013    Past Surgical History:  Procedure Laterality Date   LEFT HEART CATHETERIZATION WITH CORONARY ANGIOGRAM  08/26/2013   Mild to moderate disease with 40-50% stenosis and OM1. Otherwise no significant CAD --  Surgeon: Leonie Man, MD;  Location: [facility] C[facility] LAB;  Service: Cardiovascular;;   Lower extremity arterial Dopplers  06/11/2013   No occlusive disease   LUMBAR LAMINECTOMY/DECOMPRESSION MICRODISCECTOMY Right 10/07/2014   Procedure: LUMBAR LAMINECTOMY/DECOMPRESSION MICRODISCECTOMY 1 LEVEL;  Surgeon: Kary Kos, MD;  Location: [place] NEURO ORS;  Service: Neurosurgery;  Laterality: Right;  Right L3-L4 Microdiscectomy   NM MYOVIEW LTD  06/03/2013   Low risk study with no ischemia. EF roughly 44% with no regional wall motion abnormalities noted   PFTs  06/2013   Normal Volumes &  Spirometry; Moderately reduced DLCO   SHOULDER HEMI-ARTHROPLASTY Right 05/28/2015   Procedure: RIGHT SHOULDER HEMI-ARTHROPLASTY CTA HEAD AND SUBSCAP REPAIR ;  Surgeon: Netta Cedars, MD;  Location: [place];  Service: Orthopedics;  Laterality: Right;   TRANSTHORACIC ECHOCARDIOGRAM  06/11/2013   Mildly reduced EF: 45-50%. Mild anterior hypokinesis with incoordinate septal motion. No evidence of pulmonary hypertension       Home Medications    Prior to Admission medications   Medication Sig Start Date End Date Taking? Authorizing Provider  acetaminophen (TYLENOL) 650 MG CR tablet Take 650 mg by mouth every 8 (eight) hours as needed  for pain.    [provider]  aspirin EC 81 MG tablet Take 81 mg by mouth daily.    [provider]  HYDROcodone-acetaminophen (NORCO/VICODIN) 5-325 MG tablet Take 2 tablets by mouth every 4 (four) hours as needed. 01/22/20   Gareth Morgan, MD  ketorolac (TORADOL) 10 MG tablet Take 1 tablet (10 mg total) by mouth every 6 (six) hours as needed. 01/24/20   Tasia Catchings, Ezio Wieck V, PA-C  losartan (COZAAR) 100 MG tablet Take 0.5 tablets (50 mg total) by mouth daily. 11/17/19   [name], DO  nitroGLYCERIN (NITROSTAT) 0.4 MG SL tablet Place 1 tablet (0.4 mg total) under the tongue every 5 (five) minutes as needed for chest pain. 02/25/18   [name], DO  omeprazole (PRILOSEC OTC) 20 MG tablet Take 1 tablet (20 mg total) by mouth daily. 06/20/17   [name], DO  ondansetron (ZOFRAN ODT) 4 MG disintegrating tablet Take 1 tablet (4 mg total) by mouth every 8 (eight) hours as needed for nausea or vomiting. 01/22/20   Gareth Morgan, MD  pravastatin (PRAVACHOL) 40 MG tablet Take 1 tablet by mouth once daily 11/17/19   [name], DO  sertraline (ZOLOFT) 50 MG tablet Take 1 tablet (50 mg total) by mouth daily. Take for a total of 150 mg daily. 12/30/18   [name], DO  tamsulosin (FLOMAX) 0.4 MG CAPS capsule Take 1 capsule (0.4 mg total) by mouth daily for 14 days. 01/22/20 02/05/20  Gareth Morgan, MD    Family History Family History  Problem Relation Age of Onset   Atrial fibrillation Mother        Alive at 63   COPD Mother    Hypertension Mother    Colonic polyp Mother    Hypertension Father        Alive at 81   Lung cancer Father        Chronic smoker   Factor V Leiden deficiency Father        Also Lupus anticoagulant   Heart attack Maternal Grandmother 48   Heart failure Paternal Grandmother    Diabetes Paternal Grandmother    Heart attack Brother 41       X2   Heart attack Sister 59       Deceased   Healthy  Sister        x2   Healthy Son        x2   Colon cancer Neg Hx    Esophageal cancer Neg Hx    Rectal cancer Neg Hx    Stomach cancer Neg Hx     Social History Social History   Tobacco Use   Smoking status: Current Every Day Smoker    Packs/day: 1.00    Years: 42.00    Pack years: 42.00    Types: Cigarettes    Last attempt to quit: 08/27/2017    Years since quitting: 2.4   Smokeless tobacco: Never Used  Vaping Use   Vaping Use: Never used  Substance Use Topics   Alcohol use: Yes    Alcohol/week: 0.0 standard drinks  Comment: rarely   Drug use: No     Allergies   Isosorbide nitrate, Oxycodone, and Penicillins   Review of Systems Review of Systems  Reason unable to perform ROS: See HPI as above.     Physical Exam Triage Vital Signs ED Triage Vitals  Enc Vitals Group     BP 01/24/20 1359 127/80     Pulse Rate 01/24/20 1359 75     Resp 01/24/20 1359 16     Temp 01/24/20 1359 98.1 F (36.7 C)     Temp Source 01/24/20 1359 Oral     SpO2 01/24/20 1359 95 %     Weight --      Height --      Head Circumference --      Peak Flow --      Pain Score 01/24/20 1401 10     Pain Loc --      Pain Edu? --      Excl. in [place]? --    No data found.  Updated Vital Signs BP 127/80 (BP Location: Left Arm)    Pulse 75    Temp 98.1 F (36.7 C) (Oral)    Resp 16    SpO2 95%   Physical Exam Constitutional:      General: He is not in acute distress.    Appearance: Normal appearance. He is well-developed. He is not toxic-appearing or diaphoretic.  HENT:     Head: Normocephalic and atraumatic.  Eyes:     Conjunctiva/sclera: Conjunctivae normal.     Pupils: Pupils are equal, round, and reactive to light.  Pulmonary:     Effort: Pulmonary effort is normal. No respiratory distress.     Comments: Speaking in full sentences without difficulty Abdominal:     Tenderness: There is left CVA tenderness. There is no right CVA tenderness.  Musculoskeletal:     Cervical  back: Normal range of motion and neck supple.  Skin:    General: Skin is warm and dry.  Neurological:     Mental Status: He is alert and oriented to person, place, and time.    UC Treatments / Results  Labs (all labs ordered are listed, but only abnormal results are displayed) Labs Reviewed - No data to display  EKG   Radiology No results found.  Procedures Procedures (including critical care time)  Medications Ordered in UC Medications  ketorolac (TORADOL) 15 MG/ML injection 15 mg (15 mg Intramuscular Given 01/24/20 1423)    Initial Impression / Assessment and Plan / UC Course  I have reviewed the triage vital signs and the nursing notes.  Pertinent labs & imaging results that were available during my care of the patient were reviewed by me and considered in my medical decision making (see chart for details).    Urine culture without growth 2 days ago.  Patient still urinating adequately.  Will provide Toradol injection in office.  Will provide Toradol p.o. for further pain relief.  Continue Flomax, Norco as needed.  Push fluids.  Return precautions given.  Otherwise to follow-up with urology as directed by ED.  Patient and spouse expressed understanding and agrees to plan.  Final Clinical Impressions(s) / UC Diagnoses   Final diagnoses:  Urethral stone   ED Prescriptions    Medication Sig Dispense Auth. Provider   ketorolac (TORADOL) 10 MG tablet Take 1 tablet (10 mg total) by mouth every 6 (six) hours as needed. 24 tablet Ok Edwards, PA-C     PDMP not  reviewed this encounter.   Ok Edwards, PA-C 01/24/20 1456

## 2020-02-02 ENCOUNTER — Encounter: Payer: Self-pay | Admitting: Family Medicine

## 2020-02-02 ENCOUNTER — Other Ambulatory Visit: Payer: Self-pay | Admitting: Family Medicine

## 2020-02-02 ENCOUNTER — Other Ambulatory Visit: Payer: Self-pay

## 2020-02-02 ENCOUNTER — Ambulatory Visit (INDEPENDENT_AMBULATORY_CARE_PROVIDER_SITE_OTHER): Payer: Self-pay | Admitting: Family Medicine

## 2020-02-02 ENCOUNTER — Other Ambulatory Visit (INDEPENDENT_AMBULATORY_CARE_PROVIDER_SITE_OTHER): Payer: Medicaid Other

## 2020-02-02 VITALS — BP 112/78 | HR 47 | Temp 97.8°F | Ht 71.0 in | Wt 219.4 lb

## 2020-02-02 DIAGNOSIS — R790 Abnormal level of blood mineral: Secondary | ICD-10-CM

## 2020-02-02 DIAGNOSIS — N289 Disorder of kidney and ureter, unspecified: Secondary | ICD-10-CM

## 2020-02-02 DIAGNOSIS — D72818 Other decreased white blood cell count: Secondary | ICD-10-CM

## 2020-02-02 DIAGNOSIS — Z0000 Encounter for general adult medical examination without abnormal findings: Secondary | ICD-10-CM

## 2020-02-02 DIAGNOSIS — I2510 Atherosclerotic heart disease of native coronary artery without angina pectoris: Secondary | ICD-10-CM

## 2020-02-02 DIAGNOSIS — Z122 Encounter for screening for malignant neoplasm of respiratory organs: Secondary | ICD-10-CM

## 2020-02-02 DIAGNOSIS — D729 Disorder of white blood cells, unspecified: Secondary | ICD-10-CM

## 2020-02-02 DIAGNOSIS — Z125 Encounter for screening for malignant neoplasm of prostate: Secondary | ICD-10-CM

## 2020-02-02 LAB — CBC
HCT: 38 % — ABNORMAL LOW (ref 39.0–52.0)
Hemoglobin: 12.5 g/dL — ABNORMAL LOW (ref 13.0–17.0)
MCHC: 32.9 g/dL (ref 30.0–36.0)
MCV: 82.2 fl (ref 78.0–100.0)
Platelets: 197 10*3/uL (ref 150.0–400.0)
RBC: 4.62 Mil/uL (ref 4.22–5.81)
RDW: 16.9 % — ABNORMAL HIGH (ref 11.5–15.5)
WBC: 7.7 10*3/uL (ref 4.0–10.5)

## 2020-02-02 LAB — COMPREHENSIVE METABOLIC PANEL
ALT: 13 U/L (ref 0–53)
AST: 16 U/L (ref 0–37)
Albumin: 3.6 g/dL (ref 3.5–5.2)
Alkaline Phosphatase: 96 U/L (ref 39–117)
BUN: 29 mg/dL — ABNORMAL HIGH (ref 6–23)
CO2: 24 mEq/L (ref 19–32)
Calcium: 8.8 mg/dL (ref 8.4–10.5)
Chloride: 109 mEq/L (ref 96–112)
Creatinine, Ser: 1.42 mg/dL (ref 0.40–1.50)
GFR: 50.27 mL/min — ABNORMAL LOW (ref >60.00)
Glucose, Bld: 96 mg/dL (ref 70–99)
Potassium: 4.5 mEq/L (ref 3.5–5.1)
Sodium: 141 mEq/L (ref 135–145)
Total Bilirubin: 0.4 mg/dL (ref 0.2–1.2)
Total Protein: 6.8 g/dL (ref 6.0–8.3)

## 2020-02-02 LAB — LIPID PANEL
Cholesterol: 133 mg/dL (ref 0–200)
HDL: 31.8 mg/dL — ABNORMAL LOW (ref >39.00)
LDL Cholesterol: 85 mg/dL (ref 0–99)
NonHDL: 101.59
Total CHOL/HDL Ratio: 4
Triglycerides: 84 mg/dL (ref 0.0–149.0)
VLDL: 16.8 mg/dL (ref 0.0–40.0)

## 2020-02-02 LAB — IBC + FERRITIN
Ferritin: 18.8 ng/mL — ABNORMAL LOW (ref 22.0–322.0)
Iron: 62 ug/dL (ref 42–165)
Saturation Ratios: 18.5 % — ABNORMAL LOW (ref 20.0–50.0)
Transferrin: 240 mg/dL (ref 212.0–360.0)

## 2020-02-02 LAB — PSA: PSA: 0.71 ng/mL (ref 0.10–4.00)

## 2020-02-02 MED ORDER — SERTRALINE HCL 100 MG PO TABS: 100.0000 mg | ORAL_TABLET | Freq: Every day | ORAL | 2 refills | Status: DC

## 2020-02-02 MED ORDER — SERTRALINE HCL 50 MG PO TABS: 50.0000 mg | ORAL_TABLET | Freq: Every day | ORAL | 2 refills | Status: DC

## 2020-02-02 MED ORDER — LOSARTAN POTASSIUM 50 MG PO TABS
50.0000 mg | ORAL_TABLET | Freq: Every day | ORAL | 2 refills | Status: DC
Start: 2020-02-02 — End: 2020-11-23

## 2020-02-02 MED ORDER — KETOROLAC TROMETHAMINE 10 MG PO TABS: 10.0000 mg | ORAL_TABLET | Freq: Four times a day (QID) | ORAL | 0 refills | Status: DC | PRN

## 2020-02-02 MED ORDER — TAMSULOSIN HCL 0.4 MG PO CAPS: 0.4000 mg | ORAL_CAPSULE | Freq: Every day | ORAL | 0 refills | Status: DC

## 2020-02-02 MED ORDER — PRAVASTATIN SODIUM 40 MG PO TABS: 40.0000 mg | ORAL_TABLET | Freq: Every day | ORAL | 2 refills | Status: DC

## 2020-02-02 MED FILL — HYDROCODON-APAP 5-325: 5-325 | 1 days supply | Qty: 4 | Fill #1

## 2020-02-02 NOTE — Patient Instructions (Addendum)
Give [place] 2-3 business days to get the results of your labs back.   Try to get around 60 oz of water daily.   Keep the diet clean and stay active.   Call Dr. Vena Rua office regarding follow up colonoscopy. I believe he wanted 3 year follow up, in which you would be due now.   The new Shingrix vaccine (for shingles) is a 2 shot series. It can make people feel low energy, achy and almost like they have the flu for 48 hours after injection. Please plan accordingly when deciding on when to get this shot. Call our office for a nurse visit appointment to get this. The second shot of the series is less severe regarding the side effects, but it still lasts 48 hours. Do not get within 2 week of the covid vaccine. I recommend [employer]a or Moderna.   Someone will reach out again regarding the scan for your chest.  Stop smoking.   Let us know if you need anything.

## 2020-02-02 NOTE — Progress Notes (Signed)
Chief Complaint  Patient presents with   Annual Exam    Well Male Charles Grimes is here for a complete physical.   His last physical was >1 year ago.  Current diet: in general, a "healthy" diet.  Current exercise: active in yard Weight trend: has lost some Fatigue out of ordinary? No. Seat belt? Yes.    Health maintenance Shingrix- No Colonoscopy- 2018 Tetanus- Yes HIV- Yes Hep C- Yes Lung cancer screening- No   Past Medical History:  Diagnosis Date   Anginal pain ([place])    Anxiety    Arthritis    Coronary artery disease, non-occlusive 08/2013   Mild to moderate (40% OM1) single vessel CAD. Otherwise nonobstructed.   Depression    Dyslipidemia, goal LDL below 100    Essential hypertension    Family history of premature CAD    GERD (gastroesophageal reflux disease)    H/O skin disorder    Involving hands. Subsequently resolved.    Nonischemic cardiomyopathy ([place]) 05/2013   Non-ischemic: EF ~45% by Echo & Myoview --> Non-obstructive CAD   Obesity (BMI 30.0-34.9)    OSA (obstructive sleep apnea) 06/21/2017      Past Surgical History:  Procedure Laterality Date   LEFT HEART CATHETERIZATION WITH CORONARY ANGIOGRAM  08/26/2013   Mild to moderate disease with 40-50% stenosis and OM1. Otherwise no significant CAD --  Surgeon: Leonie Man, MD;  Location: [facility] CATH LAB;  Service: Cardiovascular;;   Lower extremity arterial Dopplers  06/11/2013   No occlusive disease   LUMBAR LAMINECTOMY/DECOMPRESSION MICRODISCECTOMY Right 10/07/2014   Procedure: LUMBAR LAMINECTOMY/DECOMPRESSION MICRODISCECTOMY 1 LEVEL;  Surgeon: Kary Kos, MD;  Location: [place] NEURO ORS;  Service: Neurosurgery;  Laterality: Right;  Right L3-L4 Microdiscectomy   NM MYOVIEW LTD  06/03/2013   Low risk study with no ischemia. EF roughly 44% with no regional wall motion abnormalities noted   PFTs  06/2013   Normal Volumes &  Spirometry; Moderately reduced DLCO   SHOULDER HEMI-ARTHROPLASTY Right  05/28/2015   Procedure: RIGHT SHOULDER HEMI-ARTHROPLASTY CTA HEAD AND SUBSCAP REPAIR ;  Surgeon: Netta Cedars, MD;  Location: [place];  Service: Orthopedics;  Laterality: Right;   TRANSTHORACIC ECHOCARDIOGRAM  06/11/2013   Mildly reduced EF: 45-50%. Mild anterior hypokinesis with incoordinate septal motion. No evidence of pulmonary hypertension    Medications  Current Outpatient Medications on File Prior to Visit  Medication Sig Dispense Refill   acetaminophen (TYLENOL) 650 MG CR tablet Take 650 mg by mouth every 8 (eight) hours as needed for pain.     aspirin EC 81 MG tablet Take 81 mg by mouth daily.     nitroGLYCERIN (NITROSTAT) 0.4 MG SL tablet Place 1 tablet (0.4 mg total) under the tongue every 5 (five) minutes as needed for chest pain. 30 tablet 1   omeprazole (PRILOSEC OTC) 20 MG tablet Take 1 tablet (20 mg total) by mouth daily. 30 tablet    ondansetron (ZOFRAN ODT) 4 MG disintegrating tablet Take 1 tablet (4 mg total) by mouth every 8 (eight) hours as needed for nausea or vomiting. 20 tablet 0   Allergies Allergies  Allergen Reactions   Isosorbide Nitrate Other (See Comments)    CONTINUOUS HEADACHE    Oxycodone Itching    Reaction was to straight oxycodone 15 mg tablets - no reaction to percocet   Penicillins Hives and Rash    Has patient had a PCN reaction causing immediate rash, facial/tongue/throat swelling, SOB or lightheadedness with hypotension: Yes Has patient had a  PCN reaction causing severe rash involving mucus membranes or skin necrosis: No Has patient had a PCN reaction that required hospitalization No Has patient had a PCN reaction occurring within the last 10 years: No If all of the above answers are "NO", then may proceed with Cephalosporin use.    Family History Family History  Problem Relation Age of Onset   Atrial fibrillation Mother        Alive at 60   COPD Mother    Hypertension Mother    Colonic polyp Mother    Hypertension Father         Alive at 25   Lung cancer Father        Chronic smoker   Factor V Leiden deficiency Father        Also Lupus anticoagulant   Heart attack Maternal Grandmother 62   Heart failure Paternal Grandmother    Diabetes Paternal Grandmother    Heart attack Brother 59       X2   Heart attack Sister 67       Deceased   Healthy Sister        x2   Healthy Son        x2   Colon cancer Neg Hx    Esophageal cancer Neg Hx    Rectal cancer Neg Hx    Stomach cancer Neg Hx     Review of Systems: Constitutional:  no fevers Eye:  no recent significant change in vision Ear/Nose/Mouth/Throat:  Ears:  no hearing loss Nose/Mouth/Throat:  no complaints of nasal congestion, no sore throat Cardiovascular:  no chest pain Respiratory:  no shortness of breath Gastrointestinal:  no change in bowel habits GU:  Male: negative for dysuria, frequency Musculoskeletal/Extremities:  No new joint pain Integumentary (Skin/Breast):  no abnormal skin lesions reported Neurologic:  no headaches Endocrine: No unexpected weight changes Hematologic/Lymphatic:  no abnormal bleeding  Exam BP 112/78 (BP Location: Left Arm, Patient Position: Sitting, Cuff Size: Normal)    Pulse (!) 47    Temp 97.8 F (36.6 C) (Oral)    Ht 5\' 11"  (1.803 m)    Wt 219 lb 6 oz (99.5 kg)    SpO2 99%    BMI 30.60 kg/m  General:  well developed, well nourished, in no apparent distress Skin:  no significant moles, warts, or growths Head:  no masses, lesions, or tenderness Eyes:  pupils equal and round, sclera anicteric without injection Ears:  canals without lesions, TMs shiny without retraction, no obvious effusion, no erythema Nose:  nares patent, septum midline, mucosa normal Throat/Pharynx:  lips and gingiva without lesion; tongue and uvula midline; non-inflamed pharynx; no exudates or postnasal drainage Neck: neck supple without adenopathy, thyromegaly, or masses Cardiac: RRR, no bruits, no LE edema Lungs:  clear to  auscultation, breath sounds equal bilaterally, no respiratory distress Rectal: Deferred Musculoskeletal:  symmetrical muscle groups noted without atrophy or deformity Neuro:  gait normal; deep tendon reflexes normal and symmetric Psych: well oriented with normal range of affect and appropriate judgment/insight  Assessment and Plan  Well adult exam - Plan: CBC, Comprehensive metabolic panel, Lipid panel  Screening for prostate cancer - Plan: PSA  Coronary artery disease, non-occlusive - Plan: pravastatin (PRAVACHOL) 40 MG tablet  Encounter for screening for lung cancer - Plan: CT CHEST LUNG CANCER SCREENING LOW DOSE WO CONTRAST   Well 64 y.o. male. Counseled on diet and exercise. Counseled on risks and benefits of prostate cancer screening with PSA. The patient agrees to  undergo testing.  Immunizations, labs, and further orders as above. Follow up 6 mo. The patient voiced understanding and agreement to the plan.  [place], DO 02/02/20 7:29 AM

## 2020-02-03 ENCOUNTER — Other Ambulatory Visit (INDEPENDENT_AMBULATORY_CARE_PROVIDER_SITE_OTHER): Payer: Medicaid Other

## 2020-02-03 DIAGNOSIS — R790 Abnormal level of blood mineral: Secondary | ICD-10-CM | POA: Diagnosis not present

## 2020-02-03 LAB — URINALYSIS, MICROSCOPIC ONLY

## 2020-02-04 ENCOUNTER — Other Ambulatory Visit (INDEPENDENT_AMBULATORY_CARE_PROVIDER_SITE_OTHER): Payer: 59

## 2020-02-04 DIAGNOSIS — R790 Abnormal level of blood mineral: Secondary | ICD-10-CM | POA: Diagnosis not present

## 2020-02-05 LAB — FECAL OCCULT BLOOD, IMMUNOCHEMICAL: Fecal Occult Bld: POSITIVE — AB

## 2020-02-10 ENCOUNTER — Encounter: Payer: Self-pay | Admitting: Internal Medicine

## 2020-02-16 ENCOUNTER — Other Ambulatory Visit: Payer: Self-pay

## 2020-02-16 ENCOUNTER — Ambulatory Visit (HOSPITAL_BASED_OUTPATIENT_CLINIC_OR_DEPARTMENT_OTHER)
Admission: RE | Admit: 2020-02-16 | Discharge: 2020-02-16 | Disposition: A | Discharge status: Home / Self Care | Payer: 59 | Source: Ambulatory Visit | Attending: Family Medicine | Admitting: Family Medicine

## 2020-02-16 ENCOUNTER — Other Ambulatory Visit (INDEPENDENT_AMBULATORY_CARE_PROVIDER_SITE_OTHER): Payer: 59

## 2020-02-16 DIAGNOSIS — N289 Disorder of kidney and ureter, unspecified: Secondary | ICD-10-CM

## 2020-02-16 DIAGNOSIS — D729 Disorder of white blood cells, unspecified: Secondary | ICD-10-CM | POA: Diagnosis not present

## 2020-02-16 DIAGNOSIS — Z122 Encounter for screening for malignant neoplasm of respiratory organs: Secondary | ICD-10-CM | POA: Insufficient documentation

## 2020-02-16 DIAGNOSIS — D72818 Other decreased white blood cell count: Secondary | ICD-10-CM

## 2020-02-16 LAB — CBC WITH DIFFERENTIAL/PLATELET
Basophils Absolute: 0.1 10*3/uL (ref 0.0–0.1)
Basophils Relative: 0.7 % (ref 0.0–3.0)
Eosinophils Absolute: 0.1 10*3/uL (ref 0.0–0.7)
Eosinophils Relative: 1.9 % (ref 0.0–5.0)
HCT: 40.6 % (ref 39.0–52.0)
Hemoglobin: 13.3 g/dL (ref 13.0–17.0)
Lymphocytes Relative: 30.7 % (ref 12.0–46.0)
Lymphs Abs: 2.4 10*3/uL (ref 0.7–4.0)
MCHC: 32.7 g/dL (ref 30.0–36.0)
MCV: 83.3 fl (ref 78.0–100.0)
Monocytes Absolute: 0.7 10*3/uL (ref 0.1–1.0)
Monocytes Relative: 8.9 % (ref 3.0–12.0)
Neutro Abs: 4.6 10*3/uL (ref 1.4–7.7)
Neutrophils Relative %: 57.8 % (ref 43.0–77.0)
Platelets: 184 10*3/uL (ref 150.0–400.0)
RBC: 4.87 Mil/uL (ref 4.22–5.81)
RDW: 17.3 % — ABNORMAL HIGH (ref 11.5–15.5)
WBC: 7.9 10*3/uL (ref 4.0–10.5)

## 2020-02-16 LAB — BASIC METABOLIC PANEL
BUN: 19 mg/dL (ref 6–23)
CO2: 25 mEq/L (ref 19–32)
Calcium: 8.9 mg/dL (ref 8.4–10.5)
Chloride: 108 mEq/L (ref 96–112)
Creatinine, Ser: 0.98 mg/dL (ref 0.40–1.50)
GFR: 77.11 mL/min (ref >60.00)
Glucose, Bld: 91 mg/dL (ref 70–99)
Potassium: 4.3 mEq/L (ref 3.5–5.1)
Sodium: 139 mEq/L (ref 135–145)

## 2020-03-23 ENCOUNTER — Encounter: Payer: 59 | Admitting: Internal Medicine

## 2020-06-18 ENCOUNTER — Ambulatory Visit
Admission: EM | Admit: 2020-06-18 | Discharge: 2020-06-18 | Disposition: A | Discharge status: Home / Self Care | Payer: 59 | Attending: Family Medicine | Admitting: Family Medicine

## 2020-06-18 DIAGNOSIS — L209 Atopic dermatitis, unspecified: Secondary | ICD-10-CM

## 2020-06-18 MED ORDER — PREDNISONE 20 MG PO TABS
40.0000 mg | ORAL_TABLET | Freq: Every day | ORAL | 0 refills | Status: AC
Start: 2020-06-18 — End: 2020-06-23

## 2020-06-18 MED ORDER — TRIAMCINOLONE ACETONIDE 0.1 % EX CREA
1.0000 | TOPICAL_CREAM | Freq: Three times a day (TID) | CUTANEOUS | 0 refills | Status: DC
Start: 2020-06-18 — End: 2021-02-08

## 2020-06-18 MED ORDER — TRIAMCINOLONE ACETONIDE 0.1 % EX CREA
1.0000 | TOPICAL_CREAM | Freq: Two times a day (BID) | CUTANEOUS | 0 refills | Status: DC
Start: 2020-06-18 — End: 2020-06-18

## 2020-06-18 NOTE — ED Provider Notes (Signed)
[facility]    CSN: [id number] Arrival date & time: 06/18/20  1725      History   Chief Complaint Chief Complaint  Patient presents with  . Wound Check    HPI Charles Grimes is a 64 y.o. male.   HPI Patient presents with 1 week of gradually worsening cracking of the palmar surface of his hands with an expanding macular rash which is dry expanding up his forearms.  He also has some cracked skin areas on his bilateral elbows. Per problem list patient has a history of a skin disorder involving the hands however patient denies history of psoriasis or eczema Past Medical History:  Diagnosis Date  . Anginal pain ([place])   . Anxiety   . Arthritis   . Coronary artery disease, non-occlusive 08/2013   Mild to moderate (40% OM1) single vessel CAD. Otherwise nonobstructed.  . Depression   . Dyslipidemia, goal LDL below 100   . Essential hypertension   . Family history of premature CAD   . GERD (gastroesophageal reflux disease)   . H/O skin disorder    Involving hands. Subsequently resolved.   . Nonischemic cardiomyopathy ([place]) 05/2013   Non-ischemic: EF ~45% by Echo & Myoview --> Non-obstructive CAD  . Obesity (BMI 30.0-34.9)   . OSA (obstructive sleep apnea) 06/21/2017    Patient Active Problem List   Diagnosis Date Noted  . Tobacco abuse 02/25/2018  . Lateral epicondylitis of left elbow 02/25/2018  . OSA (obstructive sleep apnea) 06/21/2017  . History of colon polyps 12/29/2016  . 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  . HNP (herniated nucleus pulposus), lumbar 10/07/2014  . Prostate cancer screening 06/10/2014  . Visit for preventive health examination 06/10/2014  . Fall at home 05/16/2014  . Anxiety and depression 01/11/2014  . Colon cancer screening 01/11/2014  . Former heavy cigarette smoker (20-39 per day) 09/18/2013  . Coronary artery disease, non-occlusive 08/17/2013  . Mild essential hypertension 07/28/2013  . Dyslipidemia, goal LDL below 100 07/28/2013   . Family history of premature CAD   . Family history of factor V Leiden deficiency 06/17/2013  . Obesity (BMI 30-39.9) 05/25/2013  . Shortness of breath on exertion 05/23/2013  . Intermittent claudication ([place]) 05/23/2013  . Nonischemic cardiomyopathy - mild 05/17/2013    Past Surgical History:  Procedure Laterality Date  . LEFT HEART CATHETERIZATION WITH CORONARY ANGIOGRAM  08/26/2013   Mild to moderate disease with 40-50% stenosis and OM1. Otherwise no significant CAD --  Surgeon: Leonie Man, MD;  Location: [facility] CATH LAB;  Service: Cardiovascular;;  . Lower extremity arterial Dopplers  06/11/2013   No occlusive disease  . LUMBAR LAMINECTOMY/DECOMPRESSION MICRODISCECTOMY Right 10/07/2014   Procedure: LUMBAR LAMINECTOMY/DECOMPRESSION MICRODISCECTOMY 1 LEVEL;  Surgeon: Kary Kos, MD;  Location: [place] NEURO ORS;  Service: Neurosurgery;  Laterality: Right;  Right L3-L4 Microdiscectomy  . NM MYOVIEW LTD  06/03/2013   Low risk study with no ischemia. EF roughly 44% with no regional wall motion abnormalities noted  . PFTs  06/2013   Normal Volumes &  Spirometry; Moderately reduced DLCO  . SHOULDER HEMI-ARTHROPLASTY Right 05/28/2015   Procedure: RIGHT SHOULDER HEMI-ARTHROPLASTY CTA HEAD AND SUBSCAP REPAIR ;  Surgeon: Netta Cedars, MD;  Location: [place];  Service: Orthopedics;  Laterality: Right;  . TRANSTHORACIC ECHOCARDIOGRAM  06/11/2013   Mildly reduced EF: 45-50%. Mild anterior hypokinesis with incoordinate septal motion. No evidence of pulmonary hypertension       Home Medications    Prior to Admission medications  Medication Sig Start Date End Date Taking? Authorizing Provider  aspirin EC 81 MG tablet Take 81 mg by mouth daily.    [provider]  losartan (COZAAR) 50 MG tablet Take 1 tablet (50 mg total) by mouth daily. 02/02/20   [name], DO  nitroGLYCERIN (NITROSTAT) 0.4 MG SL tablet Place 1 tablet (0.4 mg total) under the tongue every 5 (five) minutes as  needed for chest pain. 02/25/18   [name], DO  omeprazole (PRILOSEC OTC) 20 MG tablet Take 1 tablet (20 mg total) by mouth daily. 06/20/17   [name], DO  pravastatin (PRAVACHOL) 40 MG tablet Take 1 tablet (40 mg total) by mouth daily. 02/02/20   [name], DO  sertraline (ZOLOFT) 100 MG tablet Take 1 tablet (100 mg total) by mouth daily. 02/02/20   [name], DO  sertraline (ZOLOFT) 50 MG tablet Take 1 tablet (50 mg total) by mouth daily. Take for a total of 150 mg daily. 02/02/20   [name], DO    Family History Family History  Problem Relation Age of Onset  . Atrial fibrillation Mother        Alive at 38  . COPD Mother   . Hypertension Mother   . Colonic polyp Mother   . Hypertension Father        Alive at 18  . Lung cancer Father        Chronic smoker  . Factor V Leiden deficiency Father        Also Lupus anticoagulant  . Heart attack Maternal Grandmother 48  . Heart failure Paternal Grandmother   . Diabetes Paternal Grandmother   . Heart attack Brother 42       X2  . Heart attack Sister 21       Deceased  . Healthy Sister        x2  . Healthy Son        x2  . Colon cancer Neg Hx   . Esophageal cancer Neg Hx   . Rectal cancer Neg Hx   . Stomach cancer Neg Hx     Social History Social History   Tobacco Use  . Smoking status: Current Every Day Smoker    Packs/day: 1.00    Years: 42.00    Pack years: 42.00    Types: Cigarettes    Last attempt to quit: 08/27/2017    Years since quitting: 2.8  . Smokeless tobacco: Never Used  Vaping Use  . Vaping Use: Never used  Substance Use Topics  . Alcohol use: Yes    Alcohol/week: 0.0 standard drinks    Comment: rarely  . Drug use: No     Allergies   Isosorbide nitrate, Oxycodone, and Penicillins   Review of Systems Review of Systems Pertinent negatives listed in HPI  Physical Exam Triage Vital Signs ED Triage Vitals [06/18/20 1900]  Enc  Vitals Group     BP (!) 149/91     Pulse Rate 64     Resp 18     Temp 97.7 F (36.5 C)     Temp Source Oral     SpO2 98 %     Weight      Height      Head Circumference      Peak Flow      Pain Score 8     Pain Loc      Pain Edu?      Excl. in [place]?  No data found.  Updated Vital Signs BP (!) 149/91 (BP Location: Left Arm)   Pulse 64   Temp 97.7 F (36.5 C) (Oral)   Resp 18   SpO2 98%   Visual Acuity Right Eye Distance:   Left Eye Distance:   Bilateral Distance:    Right Eye Near:   Left Eye Near:    Bilateral Near:     Physical Exam General appearance: alert, well developed, well nourished, cooperative and in no distress Head: Normocephalic, without obvious abnormality, atraumatic Respiratory: Respirations even and unlabored, normal respiratory rate Heart: rate and rhythm normal. No gallop or murmurs noted on exam  Abdomen: BS +, no distention, no rebound tenderness, or no mass Extremities: No gross deformities Skin: Erythematous macular papular rash involving bilateral upper and lower extremities and abdomen.  Palmar surface of hands are also cracked and excoriated  Psych: Appropriate mood and affect. Neurologic: Mental status: Alert, oriented to person, place, and time, thought content appropriate.   UC Treatments / Results  Labs (all labs ordered are listed, but only abnormal results are displayed) Labs Reviewed - No data to display  EKG   Radiology No results found.  Procedures Procedures (including critical care time)  Medications Ordered in UC Medications - No data to display  Initial Impression / Assessment and Plan / UC Course  I have reviewed the triage vital signs and the nursing notes.  Pertinent labs & imaging results that were available during my care of the patient were reviewed by me and considered in my medical decision making (see chart for details).     Atopic dermatitis, treat prednisone 40 mg once daily x5 days.  Prescribed  triamcinolone cream apply to all affected areas 3 times daily.  Advised to purchase over-the-counter Eucerin cream and mix with the triamcinolone cream and apply to skin 3 times daily until rash and irritation resolves. completely resolved. Final Clinical Impressions(s) / UC Diagnoses   Final diagnoses:  Atopic dermatitis, unspecified type     Discharge Instructions     Purchase Eucerin cream and mix with triamcinolone cream and apply to affected areas.  Also start prednisone 40 mg once daily for 5 days. If rash does not improve would recommend follow-up with primary care provider as you may need to see a dermatologist.    ED Prescriptions    Medication Sig Dispense Auth. Provider   predniSONE (DELTASONE) 20 MG tablet Take 2 tablets (40 mg total) by mouth daily with breakfast for 5 days. 10 tablet Scot Jun, FNP   triamcinolone (KENALOG) 0.1 %  (Status: Discontinued) Apply 1 application topically 2 (two) times daily. 454 g Scot Jun, FNP   triamcinolone (KENALOG) 0.1 % Apply 1 application topically 3 (three) times daily. 454 g Scot Jun, FNP     PDMP not reviewed this encounter.   Scot Jun, FNP 06/20/20 0900

## 2020-06-18 NOTE — ED Triage Notes (Signed)
Pt states developed dry patch of skin to bilateral palm of hands. States has been putting cream on it and now the areas have open sores. States today having small bumps going up arm from hands. Denies any use of chemicals or products.

## 2020-06-18 NOTE — Discharge Instructions (Signed)
Purchase Eucerin cream and mix with triamcinolone cream and apply to affected areas.  Also start prednisone 40 mg once daily for 5 days. If rash does not improve would recommend follow-up with primary care provider as you may need to see a dermatologist.

## 2020-06-25 ENCOUNTER — Other Ambulatory Visit: Payer: Self-pay | Admitting: Family Medicine

## 2020-08-02 ENCOUNTER — Ambulatory Visit: Payer: Medicaid Other | Admitting: Family Medicine

## 2020-10-07 ENCOUNTER — Telehealth: Payer: Self-pay | Admitting: Family Medicine

## 2020-10-07 DIAGNOSIS — I2510 Atherosclerotic heart disease of native coronary artery without angina pectoris: Secondary | ICD-10-CM

## 2020-10-07 DIAGNOSIS — M7712 Lateral epicondylitis, left elbow: Secondary | ICD-10-CM

## 2020-10-07 MED ORDER — PRAVASTATIN SODIUM 40 MG PO TABS: 40.0000 mg | ORAL_TABLET | Freq: Every day | ORAL | 2 refills | Status: DC

## 2020-10-07 MED ORDER — MELOXICAM 15 MG PO TABS: 15.0000 mg | ORAL_TABLET | Freq: Every day | ORAL | 1 refills | Status: DC

## 2020-10-07 MED ORDER — SERTRALINE HCL 100 MG PO TABS: 100.0000 mg | ORAL_TABLET | Freq: Every day | ORAL | 2 refills | Status: DC

## 2020-10-07 MED ORDER — SERTRALINE HCL 50 MG PO TABS: 50.0000 mg | ORAL_TABLET | Freq: Every day | ORAL | 2 refills | Status: DC

## 2020-10-07 NOTE — Telephone Encounter (Signed)
Patients wife notified that rxs have been sent in.

## 2020-10-07 NOTE — Telephone Encounter (Signed)
Medication: sertraline (ZOLOFT) 100 MG tablet [[id number]]  pravastatin (PRAVACHOL) 40 MG tablet [[id number]]   sertraline (ZOLOFT) 50 MG tablet [[id number]]    meloxicam (MOBIC) 15 MG tablet [[id number]]   Has the patient contacted their pharmacy?  (If no, request that the patient contact the pharmacy for the refill.) (If yes, when and what did the pharmacy advise?)     Preferred Pharmacy (with phone number or street name):  [facility] ([address]), [place]  [address] [place]) [place] [address]  Phone:  [phone] Fax:  [phone]     Agent: Please be advised that RX refills may take up to 3 business days. We ask that you follow-up with your pharmacy.

## 2020-11-23 ENCOUNTER — Other Ambulatory Visit: Payer: Self-pay | Admitting: Family Medicine

## 2020-12-15 ENCOUNTER — Ambulatory Visit
Admission: RE | Admit: 2020-12-15 | Discharge: 2020-12-15 | Disposition: A | Discharge status: Home / Self Care | Payer: 59 | Source: Ambulatory Visit | Attending: Emergency Medicine | Admitting: Emergency Medicine

## 2020-12-15 ENCOUNTER — Other Ambulatory Visit: Payer: Self-pay

## 2020-12-15 VITALS — BP 126/86 | HR 74 | Temp 98.1°F | Resp 18

## 2020-12-15 DIAGNOSIS — M2550 Pain in unspecified joint: Secondary | ICD-10-CM

## 2020-12-15 MED ORDER — INDOMETHACIN 50 MG PO CAPS: 50.0000 mg | ORAL_CAPSULE | Freq: Two times a day (BID) | ORAL | 0 refills | Status: DC

## 2020-12-15 NOTE — ED Provider Notes (Signed)
[facility]    CSN: [id number] Arrival date & time: 12/15/20  1637      History   Chief Complaint Chief Complaint  Patient presents with  . Appointment    1700  . Joint Pain    HPI Charles Grimes is a 65 y.o. male history of CAD, hypertension, GERD, presenting today for evaluation of arthralgias.  Reports over the past 2 weeks he has had intermittent joint pains noted in his PIPs of bilateral hands, left hip and knee as well as his great toe.  Reports this weekend was drinking beer and eating red meat and noticed his right great toe was red swollen and hot.  Expresses concern over possible gout.  Denies any significant arthralgias at time of presentation.  Denies arthralgias prior to 2 weeks ago.  HPI  Past Medical History:  Diagnosis Date  . Anginal pain ([place])   . Anxiety   . Arthritis   . Coronary artery disease, non-occlusive 08/2013   Mild to moderate (40% OM1) single vessel CAD. Otherwise nonobstructed.  . Depression   . Dyslipidemia, goal LDL below 100   . Essential hypertension   . Family history of premature CAD   . GERD (gastroesophageal reflux disease)   . H/O skin disorder    Involving hands. Subsequently resolved.   . Nonischemic cardiomyopathy ([place]) 05/2013   Non-ischemic: EF ~45% by Echo & Myoview --> Non-obstructive CAD  . Obesity (BMI 30.0-34.9)   . OSA (obstructive sleep apnea) 06/21/2017    Patient Active Problem List   Diagnosis Date Noted  . Tobacco abuse 02/25/2018  . Lateral epicondylitis of left elbow 02/25/2018  . OSA (obstructive sleep apnea) 06/21/2017  . History of colon polyps 12/29/2016  . 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  . HNP (herniated nucleus pulposus), lumbar 10/07/2014  . Prostate cancer screening 06/10/2014  . Visit for preventive health examination 06/10/2014  . Fall at home 05/16/2014  . Anxiety and depression 01/11/2014  . Colon cancer screening 01/11/2014  . Former heavy cigarette smoker (20-39 per day)  09/18/2013  . Coronary artery disease, non-occlusive 08/17/2013  . Mild essential hypertension 07/28/2013  . Dyslipidemia, goal LDL below 100 07/28/2013  . Family history of premature CAD   . Family history of factor V Leiden deficiency 06/17/2013  . Obesity (BMI 30-39.9) 05/25/2013  . Shortness of breath on exertion 05/23/2013  . Intermittent claudication ([place]) 05/23/2013  . Nonischemic cardiomyopathy - mild 05/17/2013    Past Surgical History:  Procedure Laterality Date  . LEFT HEART CATHETERIZATION WITH CORONARY ANGIOGRAM  08/26/2013   Mild to moderate disease with 40-50% stenosis and OM1. Otherwise no significant CAD --  Surgeon: Leonie Man, MD;  Location: [facility] CATH LAB;  Service: Cardiovascular;;  . Lower extremity arterial Dopplers  06/11/2013   No occlusive disease  . LUMBAR LAMINECTOMY/DECOMPRESSION MICRODISCECTOMY Right 10/07/2014   Procedure: LUMBAR LAMINECTOMY/DECOMPRESSION MICRODISCECTOMY 1 LEVEL;  Surgeon: Kary Kos, MD;  Location: [place] NEURO ORS;  Service: Neurosurgery;  Laterality: Right;  Right L3-L4 Microdiscectomy  . NM MYOVIEW LTD  06/03/2013   Low risk study with no ischemia. EF roughly 44% with no regional wall motion abnormalities noted  . PFTs  06/2013   Normal Volumes &  Spirometry; Moderately reduced DLCO  . SHOULDER HEMI-ARTHROPLASTY Right 05/28/2015   Procedure: RIGHT SHOULDER HEMI-ARTHROPLASTY CTA HEAD AND SUBSCAP REPAIR ;  Surgeon: Netta Cedars, MD;  Location: [facility];  Service: Orthopedics;  Laterality: Right;  . TRANSTHORACIC ECHOCARDIOGRAM  06/11/2013   Mildly  reduced EF: 45-50%. Mild anterior hypokinesis with incoordinate septal motion. No evidence of pulmonary hypertension       Home Medications    Prior to Admission medications   Medication Sig Start Date End Date Taking? Authorizing Provider  indomethacin (INDOCIN) 50 MG capsule Take 1 capsule (50 mg total) by mouth 2 (two) times daily with a meal. 12/15/20  Yes Mandie Crabbe C, PA-C  aspirin EC  81 MG tablet Take 81 mg by mouth daily.    [provider]  losartan (COZAAR) 50 MG tablet Take 1 tablet by mouth once daily 11/23/20   Wendling, Crosby Oyster, DO  nitroGLYCERIN (NITROSTAT) 0.4 MG SL tablet Place 1 tablet (0.4 mg total) under the tongue every 5 (five) minutes as needed for chest pain. 02/25/18   [name], DO  omeprazole (PRILOSEC OTC) 20 MG tablet Take 1 tablet (20 mg total) by mouth daily. 06/20/17   [name], DO  pravastatin (PRAVACHOL) 40 MG tablet Take 1 tablet (40 mg total) by mouth daily. 10/07/20   [name], DO  sertraline (ZOLOFT) 100 MG tablet Take 1 tablet (100 mg total) by mouth daily. 10/07/20   [name], DO  sertraline (ZOLOFT) 50 MG tablet Take 1 tablet (50 mg total) by mouth daily. Take for a total of 150 mg daily. 10/07/20   [name], DO  tamsulosin (FLOMAX) 0.4 MG CAPS capsule TAKE 1 CAPSULE BY MOUTH ONCE DAILY FOR 14 DAYS 06/25/20   Wendling, Crosby Oyster, DO  triamcinolone (KENALOG) 0.1 % Apply 1 application topically 3 (three) times daily. 06/18/20   Scot Jun, FNP    Family History Family History  Problem Relation Age of Onset  . Atrial fibrillation Mother        Alive at 64  . COPD Mother   . Hypertension Mother   . Colonic polyp Mother   . Hypertension Father        Alive at 57  . Lung cancer Father        Chronic smoker  . Factor V Leiden deficiency Father        Also Lupus anticoagulant  . Heart attack Maternal Grandmother 48  . Heart failure Paternal Grandmother   . Diabetes Paternal Grandmother   . Heart attack Brother 16       X2  . Heart attack Sister 96       Deceased  . Healthy Sister        x2  . Healthy Son        x2  . Colon cancer Neg Hx   . Esophageal cancer Neg Hx   . Rectal cancer Neg Hx   . Stomach cancer Neg Hx     Social History Social History   Tobacco Use  . Smoking status: Current Every Day Smoker    Packs/day: 1.00     Years: 42.00    Pack years: 42.00    Types: Cigarettes    Last attempt to quit: 08/27/2017    Years since quitting: 3.3  . Smokeless tobacco: Never Used  Vaping Use  . Vaping Use: Never used  Substance Use Topics  . Alcohol use: Yes    Alcohol/week: 0.0 standard drinks    Comment: rarely  . Drug use: No     Allergies   Isosorbide nitrate, Oxycodone, and Penicillins   Review of Systems Review of Systems  Constitutional: Negative for fatigue and fever.  Eyes: Negative for redness, itching and visual  disturbance.  Respiratory: Negative for shortness of breath.   Cardiovascular: Negative for chest pain and leg swelling.  Gastrointestinal: Negative for nausea and vomiting.  Musculoskeletal: Positive for arthralgias. Negative for myalgias.  Skin: Negative for color change, rash and wound.  Neurological: Negative for dizziness, syncope, weakness, light-headedness and headaches.     Physical Exam Triage Vital Signs ED Triage Vitals  Enc Vitals Group     BP 12/15/20 1709 126/86     Pulse Rate 12/15/20 1709 74     Resp 12/15/20 1709 18     Temp 12/15/20 1709 98.1 F (36.7 C)     Temp Source 12/15/20 1709 Oral     SpO2 12/15/20 1709 96 %     Weight --      Height --      Head Circumference --      Peak Flow --      Pain Score 12/15/20 1710 5     Pain Loc --      Pain Edu? --      Excl. in [place]? --    No data found.  Updated Vital Signs BP 126/86 (BP Location: Left Arm)   Pulse 74   Temp 98.1 F (36.7 C) (Oral)   Resp 18   SpO2 96%   Visual Acuity Right Eye Distance:   Left Eye Distance:   Bilateral Distance:    Right Eye Near:   Left Eye Near:    Bilateral Near:     Physical Exam Vitals and nursing note reviewed.  Constitutional:      Appearance: He is well-developed.     Comments: No acute distress  HENT:     Head: Normocephalic and atraumatic.     Nose: Nose normal.  Eyes:     Conjunctiva/sclera: Conjunctivae normal.  Cardiovascular:     Rate and  Rhythm: Normal rate.  Pulmonary:     Effort: Pulmonary effort is normal. No respiratory distress.  Abdominal:     General: There is no distension.  Musculoskeletal:        General: Normal range of motion.     Cervical back: Neck supple.     Comments: Full active range of motion of PIPs and PIP and MCP joints of bilateral hands, grip strength 5/5 and equal bilaterally, radial pulse 2+, no overlying erythema or rash  Right great toe without erythema or warmth or tenderness, callus present  Ambulating independently without abnormality  Skin:    General: Skin is warm and dry.  Neurological:     Mental Status: He is alert and oriented to person, place, and time.      UC Treatments / Results  Labs (all labs ordered are listed, but only abnormal results are displayed) Labs Reviewed - No data to display  EKG   Radiology No results found.  Procedures Procedures (including critical care time)  Medications Ordered in UC Medications - No data to display  Initial Impression / Assessment and Plan / UC Course  I have reviewed the triage vital signs and the nursing notes.  Pertinent labs & imaging results that were available during my care of the patient were reviewed by me and considered in my medical decision making (see chart for details).     Migratory arthralgias-discussed possibility of gout or other underlying arthritis contributing to symptoms.  Provided indomethacin to use as needed, provided foods to avoid in setting of gout and recommended follow-up with PCP for further evaluation and blood work for any underlying  arthritis/rheumatologic/gout contributors.  Discussed strict return precautions. Patient verbalized understanding and is agreeable with plan.  Final Clinical Impressions(s) / UC Diagnoses   Final diagnoses:  Arthralgia of multiple sites, bilateral     Discharge Instructions     Please use indomethacin twice daily with food as needed for joint  pains Follow-up with primary care for further blood work/evaluation of underlying arthritis/gout Return if any symptoms changing worsening    ED Prescriptions    Medication Sig Dispense Auth. Provider   indomethacin (INDOCIN) 50 MG capsule Take 1 capsule (50 mg total) by mouth 2 (two) times daily with a meal. 30 capsule Jos Cygan C, PA-C     PDMP not reviewed this encounter.   Janith Lima, [place] 12/15/20 1836

## 2020-12-15 NOTE — ED Triage Notes (Signed)
Pt sts random joint pain x 2 weeks that seems to be worse after eating red meat or drinking beer; pt concerned for gout; pt sts pain in hand, legs and toes

## 2020-12-15 NOTE — Discharge Instructions (Signed)
Please use indomethacin twice daily with food as needed for joint pains Follow-up with primary care for further blood work/evaluation of underlying arthritis/gout Return if any symptoms changing worsening

## 2021-01-03 ENCOUNTER — Encounter: Payer: Self-pay | Admitting: Physician Assistant

## 2021-01-03 ENCOUNTER — Telehealth: Payer: 59 | Admitting: Physician Assistant

## 2021-01-03 DIAGNOSIS — M2550 Pain in unspecified joint: Secondary | ICD-10-CM

## 2021-01-03 MED ORDER — PREDNISONE 10 MG (21) PO TBPK: ORAL_TABLET | Freq: Every day | ORAL | 0 refills | Status: DC

## 2021-01-03 MED ORDER — DICLOFENAC SODIUM 1 % EX GEL: 2.0000 g | Freq: Four times a day (QID) | CUTANEOUS | 0 refills | Status: DC

## 2021-01-03 NOTE — Progress Notes (Signed)
[name], [place] are scheduled for a virtual visit with your provider today.    Just as we do with appointments in the office, we must obtain your consent to participate.  Your consent will be active for this visit and any virtual visit you may have with one of our providers in the next 365 days.    If you have a MyChart account, I can also send a copy of this consent to you electronically.  All virtual visits are billed to your insurance company just like a traditional visit in the office.  As this is a virtual visit, video technology does not allow for your provider to perform a traditional examination.  This may limit your provider's ability to fully assess your condition.  If your provider identifies any concerns that need to be evaluated in person or the need to arrange testing such as labs, EKG, etc, we will make arrangements to do so.    Although advances in technology are sophisticated, we cannot ensure that it will always work on either your end or our end.  If the connection with a video visit is poor, we may have to switch to a telephone visit.  With either a video or telephone visit, we are not always able to ensure that we have a secure connection.   I need to obtain your verbal consent now.   Are you willing to proceed with your visit today?   CHRISTOPER BUSHEY has provided verbal consent on 01/03/2021 for a virtual visit (video or telephone).   Chez Bulnes Gilman Schmidt, PA-C 01/03/2021  3:13 PM  [name], [place] are scheduled for a virtual visit with your provider today.    Just as we do with appointments in the office, we must obtain your consent to participate.  Your consent will be active for this visit and any virtual visit you may have with one of our providers in the next 365 days.    If you have a MyChart account, I can also send a copy of this consent to you electronically.  All virtual visits are billed to your insurance company just like a traditional visit in the office.  As this is a  virtual visit, video technology does not allow for your provider to perform a traditional examination.  This may limit your provider's ability to fully assess your condition.  If your provider identifies any concerns that need to be evaluated in person or the need to arrange testing such as labs, EKG, etc, we will make arrangements to do so.    Although advances in technology are sophisticated, we cannot ensure that it will always work on either your end or our end.  If the connection with a video visit is poor, we may have to switch to a telephone visit.  With either a video or telephone visit, we are not always able to ensure that we have a secure connection.   I need to obtain your verbal consent now.   Are you willing to proceed with your visit today?   Charles Grimes has provided verbal consent on 01/03/2021 for a virtual visit (video or telephone).   Rodney Booze, [place] 01/03/2021  3:13 PM   Date:  01/03/2021   ID:  Charles Grimes, DOB August 03, 1955, MRN [id number]  Patient Location: Home Provider Location: Home Office   Participants: Patient and Provider for Visit and Wrap up  Method of visit: Video  Location of Patient: Home Location of Provider: Home Office Consent was  obtain for visit over the video. Services rendered by provider: Visit was performed via video  A video enabled telemedicine application was used and I verified that I am speaking with the correct person using two identifiers.  PCP:  [name], DO   Chief Complaint:  joint pain  History of Present Illness:    ARIEN Grimes is a 65 y.o. male with history as stated below. Presents video telehealth for an acute care visit  Onset of symptoms was 2-3 weeks ago and symptoms have been persistent and include: intermittent joint swelling and pain to the left elbow, wrist, bilat great toes. Pt states sxs are worse anytime he eats red meat or fish. He does not drink etoh   Denies having fevers, chills,  shortness of breath, cough, chest pain, ear pain, sore throat or exposure to covid or other sick contacts. Modifying factors include: indomethacin is not improving symptoms  Past Medical, Surgical, Social History, Allergies, and Medications have been Reviewed.  Past Medical History:  Diagnosis Date   Anginal pain ([place])    Anxiety    Arthritis    Coronary artery disease, non-occlusive 08/2013   Mild to moderate (40% OM1) single vessel CAD. Otherwise nonobstructed.   Depression    Dyslipidemia, goal LDL below 100    Essential hypertension    Family history of premature CAD    GERD (gastroesophageal reflux disease)    H/O skin disorder    Involving hands. Subsequently resolved.    Nonischemic cardiomyopathy ([place]) 05/2013   Non-ischemic: EF ~45% by Echo & Myoview --> Non-obstructive CAD   Obesity (BMI 30.0-34.9)    OSA (obstructive sleep apnea) 06/21/2017    Current Meds  Medication Sig   diclofenac Sodium (VOLTAREN) 1 % GEL Apply 2 g topically 4 (four) times daily.   predniSONE (STERAPRED UNI-PAK 21 TAB) 10 MG (21) TBPK tablet Take by mouth daily. Take 6 tabs by mouth daily  for 2 days, then 5 tabs for 2 days, then 4 tabs for 2 days, then 3 tabs for 2 days, 2 tabs for 2 days, then 1 tab by mouth daily for 2 days     Allergies:   Isosorbide nitrate, Oxycodone, and Penicillins   ROS See HPI for history of present illness.  Physical Exam          Pt well appearing. Nad. No erythema, swelling noted to the left elbow/wrist.  MSM: pt c/o joint swelling to left elbow, wrist, bilat toes that is worse after eating red meat and fish. Suspect gout. Sxs not completely resolved after indomethicin. Will try prednisone taper and voltaren gel. Advised pcp f/u and return precautions.  There are no diagnoses linked to this encounter.   Time:   Today, I have spent 29 minutes with the patient with telehealth technology discussing the above problems, reviewing the chart, previous notes, medications  and orders.    Tests Ordered: No orders of the defined types were placed in this encounter.   Medication Changes: Meds ordered this encounter  Medications   predniSONE (STERAPRED UNI-PAK 21 TAB) 10 MG (21) TBPK tablet    Sig: Take by mouth daily. Take 6 tabs by mouth daily  for 2 days, then 5 tabs for 2 days, then 4 tabs for 2 days, then 3 tabs for 2 days, 2 tabs for 2 days, then 1 tab by mouth daily for 2 days    Dispense:  42 tablet    Refill:  0    Order  Specific Question:   Supervising Provider    Answer:   Noemi Chapel [[id number]]   diclofenac Sodium (VOLTAREN) 1 % GEL    Sig: Apply 2 g topically 4 (four) times daily.    Dispense:  100 g    Refill:  0    Order Specific Question:   Supervising Provider    Answer:   Noemi Chapel [[id number]]     Disposition:  Follow up  Signed, [place], PA-C  01/03/2021 3:13 PM

## 2021-02-02 ENCOUNTER — Encounter: Payer: 59 | Admitting: Family Medicine

## 2021-02-08 ENCOUNTER — Ambulatory Visit (INDEPENDENT_AMBULATORY_CARE_PROVIDER_SITE_OTHER): Payer: 59 | Admitting: Family Medicine

## 2021-02-08 ENCOUNTER — Encounter: Payer: Self-pay | Admitting: Family Medicine

## 2021-02-08 ENCOUNTER — Other Ambulatory Visit: Payer: Self-pay

## 2021-02-08 VITALS — BP 112/78 | HR 75 | Temp 97.7°F | Ht 71.0 in | Wt 232.1 lb

## 2021-02-08 DIAGNOSIS — Z0000 Encounter for general adult medical examination without abnormal findings: Secondary | ICD-10-CM | POA: Diagnosis not present

## 2021-02-08 DIAGNOSIS — M67952 Unspecified disorder of synovium and tendon, left thigh: Secondary | ICD-10-CM | POA: Diagnosis not present

## 2021-02-08 DIAGNOSIS — I428 Other cardiomyopathies: Secondary | ICD-10-CM | POA: Diagnosis not present

## 2021-02-08 DIAGNOSIS — J432 Centrilobular emphysema: Secondary | ICD-10-CM | POA: Diagnosis not present

## 2021-02-08 DIAGNOSIS — I2510 Atherosclerotic heart disease of native coronary artery without angina pectoris: Secondary | ICD-10-CM

## 2021-02-08 DIAGNOSIS — Z125 Encounter for screening for malignant neoplasm of prostate: Secondary | ICD-10-CM | POA: Diagnosis not present

## 2021-02-08 DIAGNOSIS — I739 Peripheral vascular disease, unspecified: Secondary | ICD-10-CM

## 2021-02-08 MED ORDER — NITROGLYCERIN 0.4 MG SL SUBL: 0.4000 mg | SUBLINGUAL_TABLET | SUBLINGUAL | 1 refills | Status: AC | PRN

## 2021-02-08 MED ORDER — TAMSULOSIN HCL 0.4 MG PO CAPS: ORAL_CAPSULE | ORAL | 0 refills | Status: DC

## 2021-02-08 MED ORDER — TRIAMCINOLONE ACETONIDE 0.1 % EX CREA: 1.0000 "application " | TOPICAL_CREAM | Freq: Three times a day (TID) | CUTANEOUS | 0 refills | Status: DC

## 2021-02-08 NOTE — Progress Notes (Signed)
Chief Complaint  Patient presents with   Annual Exam    Gout Hip pain      Well Male Charles Grimes is here for a complete physical.   His last physical was >1 year ago.  Current diet: in general, a "healthy" diet.  Current exercise: active in yard Weight trend: increased Fatigue out of ordinary? No. Seat belt? Yes.    Health maintenance Shingrix- No Colonoscopy- Scheduled Tetanus- Yes HIV- Yes Hep C- Yes Lung cancer screening- Yes  Left hip pain The patient has had several months of left hip pain, over the outer area.  No specific injury or change in activity.  Sharp and achy pain.  No neurologic signs or symptoms.  He does not have any swelling, bruising, or redness.  He has not tried anything so far.   Past Medical History:  Diagnosis Date   Anginal pain ([place])    Anxiety    Arthritis    Coronary artery disease, non-occlusive 08/2013   Mild to moderate (40% OM1) single vessel CAD. Otherwise nonobstructed.   Depression    Dyslipidemia, goal LDL below 100    Essential hypertension    Family history of premature CAD    GERD (gastroesophageal reflux disease)    H/O skin disorder    Involving hands. Subsequently resolved.    Nonischemic cardiomyopathy ([place]) 05/2013   Non-ischemic: EF ~45% by Echo & Myoview --> Non-obstructive CAD   Obesity (BMI 30.0-34.9)    OSA (obstructive sleep apnea) 06/21/2017      Past Surgical History:  Procedure Laterality Date   LEFT HEART CATHETERIZATION WITH CORONARY ANGIOGRAM  08/26/2013   Mild to moderate disease with 40-50% stenosis and OM1. Otherwise no significant CAD --  Surgeon: Leonie Man, MD;  Location: [facility] CATH LAB;  Service: Cardiovascular;;   Lower extremity arterial Dopplers  06/11/2013   No occlusive disease   LUMBAR LAMINECTOMY/DECOMPRESSION MICRODISCECTOMY Right 10/07/2014   Procedure: LUMBAR LAMINECTOMY/DECOMPRESSION MICRODISCECTOMY 1 LEVEL;  Surgeon: Kary Kos, MD;  Location: [place] NEURO ORS;  Service: Neurosurgery;   Laterality: Right;  Right L3-L4 Microdiscectomy   NM MYOVIEW LTD  06/03/2013   Low risk study with no ischemia. EF roughly 44% with no regional wall motion abnormalities noted   PFTs  06/2013   Normal Volumes &  Spirometry; Moderately reduced DLCO   SHOULDER HEMI-ARTHROPLASTY Right 05/28/2015   Procedure: RIGHT SHOULDER HEMI-ARTHROPLASTY CTA HEAD AND SUBSCAP REPAIR ;  Surgeon: Netta Cedars, MD;  Location: [place];  Service: Orthopedics;  Laterality: Right;   TRANSTHORACIC ECHOCARDIOGRAM  06/11/2013   Mildly reduced EF: 45-50%. Mild anterior hypokinesis with incoordinate septal motion. No evidence of pulmonary hypertension    Medications  Current Outpatient Medications on File Prior to Visit  Medication Sig Dispense Refill   aspirin EC 81 MG tablet Take 81 mg by mouth daily.     diclofenac Sodium (VOLTAREN) 1 % GEL Apply 2 g topically 4 (four) times daily. 100 g 0   losartan (COZAAR) 50 MG tablet Take 1 tablet by mouth once daily 90 tablet 0   omeprazole (PRILOSEC OTC) 20 MG tablet Take 1 tablet (20 mg total) by mouth daily. 30 tablet    pravastatin (PRAVACHOL) 40 MG tablet Take 1 tablet (40 mg total) by mouth daily. 90 tablet 2   sertraline (ZOLOFT) 100 MG tablet Take 1 tablet (100 mg total) by mouth daily. 90 tablet 2   sertraline (ZOLOFT) 50 MG tablet Take 1 tablet (50 mg total) by mouth daily. Take  for a total of 150 mg daily. 90 tablet 2   Allergies Allergies  Allergen Reactions   Isosorbide Nitrate Other (See Comments)    CONTINUOUS HEADACHE    Oxycodone Itching    Reaction was to straight oxycodone 15 mg tablets - no reaction to percocet   Penicillins Hives and Rash    Has patient had a PCN reaction causing immediate rash, facial/tongue/throat swelling, SOB or lightheadedness with hypotension: Yes Has patient had a PCN reaction causing severe rash involving mucus membranes or skin necrosis: No Has patient had a PCN reaction that required hospitalization No Has patient had a PCN  reaction occurring within the last 10 years: No If all of the above answers are "NO", then may proceed with Cephalosporin use.    Family History Family History  Problem Relation Age of Onset   Atrial fibrillation Mother        Alive at 84   COPD Mother    Hypertension Mother    Colonic polyp Mother    Hypertension Father        Alive at 36   Lung cancer Father        Chronic smoker   Factor V Leiden deficiency Father        Also Lupus anticoagulant   Heart attack Maternal Grandmother 81   Heart failure Paternal Grandmother    Diabetes Paternal Grandmother    Heart attack Brother 69       X2   Heart attack Sister 40       Deceased   Healthy Sister        x2   Healthy Son        x2   Colon cancer Neg Hx    Esophageal cancer Neg Hx    Rectal cancer Neg Hx    Stomach cancer Neg Hx     Review of Systems: Constitutional:  no fevers Eye:  no recent significant change in vision Ear/Nose/Mouth/Throat:  Ears:  no hearing loss Nose/Mouth/Throat:  no complaints of nasal congestion, no sore throat Cardiovascular:  no chest pain Respiratory:  no shortness of breath Gastrointestinal:  no change in bowel habits GU:  Male: negative for dysuria, frequency Musculoskeletal/Extremities: + Left hip pain Integumentary (Skin/Breast):  no abnormal skin lesions reported Neurologic:  no headaches Endocrine: No unexpected weight changes Hematologic/Lymphatic:  no abnormal bleeding  Exam BP 112/78   Pulse 75   Temp 97.7 F (36.5 C) (Oral)   Ht '5\' 11"'$  (1.803 m)   Wt 232 lb 2 oz (105.3 kg)   SpO2 98%   BMI 32.37 kg/m  General:  well developed, well nourished, in no apparent distress Skin:  no significant moles, warts, or growths Head:  no masses, lesions, or tenderness Eyes:  pupils equal and round, sclera anicteric without injection Ears:  canals without lesions, TMs shiny without retraction, no obvious effusion, no erythema Nose:  nares patent, septum midline, mucosa  normal Throat/Pharynx:  lips and gingiva without lesion; tongue and uvula midline; non-inflamed pharynx; no exudates or postnasal drainage Neck: neck supple without adenopathy, thyromegaly, or masses Cardiac: RRR, no bruits, no LE edema Lungs:  clear to auscultation, breath sounds equal bilaterally, no respiratory distress Abdomen: BS+, soft, non-tender, non-distended, no masses or organomegaly noted Rectal: Deferred Musculoskeletal:  + TTP over the left greater trochanteric region, there is pain with resisted abduction of the left hip; negative logroll Neuro:  gait normal; deep tendon reflexes normal and symmetric Psych: well oriented with normal range of  affect and appropriate judgment/insight  Assessment and Plan  Well adult exam - Plan: Uric acid  Tendinopathy of left gluteus medius  Screening for prostate cancer - Plan: PSA, Medicare ( [place] only)  Coronary artery disease, non-occlusive - Plan: nitroGLYCERIN (NITROSTAT) 0.4 MG SL tablet, Comprehensive metabolic panel, Lipid panel, CBC  Centrilobular emphysema (HCC)  Nonischemic cardiomyopathy ([place]), Chronic  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 male. Counseled on diet and exercise. Counseled on risks and benefits of prostate cancer screening with PSA. The patient agrees to undergo testing. Hip pain: Likely a gluteus medius tendinopathy.  Stretches and exercises given.  Heat, ice, Tylenol, Voltaren gel.  If no improvement, will consider steroid injection versus physical therapy. Shingles vaccination recommended in addition to COVID booster. Immunizations, labs, and further orders as above. Follow up in 6 months or as needed. The patient voiced understanding and agreement to the plan.  [place], DO 02/08/21 3:43 PM

## 2021-02-08 NOTE — Patient Instructions (Addendum)
Give [place] 2-3 business days to get the results of your labs back.   Heat (pad or rice pillow in microwave) over affected area, 10-15 minutes twice daily.   OK to take Tylenol 1000 mg (2 extra strength tabs) or 975 mg (3 regular strength tabs) every 6 hours as needed.  OK to use Voltaren gel.   Keep the diet clean and stay active.  The new Shingrix vaccine (for shingles) is a 2 shot series. It can make people feel low energy, achy and almost like they have the flu for 48 hours after injection. Please plan accordingly when deciding on when to get this shot. Call your pharmacy for an appointment to get this. The second shot of the series is less severe regarding the side effects, but it still lasts 48 hours.   Let us know if you need anything.  Gluteus Medius Syndrome Rehab It is normal to feel mild stretching, pulling, tightness, or discomfort as you do these exercises, but you should stop right away if you feel sudden pain or your pain gets worse.   Stretching and range of motion exercise This exercise warms up your muscles and joints and improves the movement and flexibility of your hip and pelvis. This exercise also helps to relieve pain and stiffness. Exercise A: Lunge (hip flexor stretch)      Kneel on the floor on your left / right knee. Bend your other knee so it is directly over your ankle. Keep good posture with your head over your shoulders. Tuck your tailbone underneath you. This will prevent your back from arching too much. You should feel a gentle stretch in the front of your thigh or hip. If you do not feel a stretch, slowly lunge forward with your chest up. Hold this position for 30 seconds. Slowly return to the starting position. Repeat 2 times. Complete this exercise 3 times per week. Strengthening exercises These exercises build strength and endurance in your hip and pelvis. Endurance is the ability to use your muscles for a long time, even after they get tired. Exercise  B: Bridge (hip extensors)     Lie on your back on a firm surface with your knees bent and your feet flat on the floor. Tighten your buttocks muscles and lift your bottom off the floor until the trunk of your body is level with your thighs. You should feel the muscles working in your buttocks and the back of your thighs. If this exercise is too easy, cross your arms over your chest or lift one leg while your bottom is up off the floor. Do not arch your back. Hold this position for 3 seconds. Slowly lower your hips to the starting position. Let your muscles relax completely between repetitions. Repeat 2 times. Complete this exercise 3 times per week. Exercise C: Straight leg raises (hip abductors)     Lie on your side with your left / right leg in the top position. Lie so your head, shoulder, knee, and hip line up. Bend your bottom knee to help you balance. Lift your top leg up 4-6 inches (10-15 cm), keeping your toes pointed straight ahead. Hold this position for 2 seconds. Slowly lower your leg to the starting position and let your muscles relax completely. Repeat for a total of 10 repetitions. Repeat 2 times. Complete this exercise 3 times per week. Exercise D: Hip abductors and external rotators, quadruped Get on your hands and knees on a firm, lightly padded surface. Your hands should  be directly below your shoulders, and your knees should be directly below your hips. Lift your left / right knee out to the side. Keep your knee bent. Do not twist your body. Hold this position for 3 seconds. Slowly lower your leg. Repeat for a total of 10 repetitions.  Repeat 2 times. Complete this exercise 3 times per week. Exercise E: Single leg stand Stand near a counter or door frame to hold onto as needed. It is helpful to look in a mirror for this exercise so you can watch your hip. Squeeze your left / right buttock muscles then lift up your other foot. Do not let your left / right hip push out to  the side. Hold this position for 3 seconds. Repeat for a total of 10 repetitions. Repeat 2 times. Complete this exercise 3 times per week. Make sure you discuss any questions you have with your health care provider. Document Released: 07/03/2005 Document Revised: 03/09/2016 Document Reviewed: 06/15/2015 Elsevier Interactive Patient Education  [employer].

## 2021-02-09 LAB — PSA, MEDICARE: PSA: 0.79 ng/ml (ref 0.10–4.00)

## 2021-02-09 LAB — URIC ACID: Uric Acid, Serum: 6.1 mg/dL (ref 4.0–7.8)

## 2021-02-09 LAB — COMPREHENSIVE METABOLIC PANEL
ALT: 18 U/L (ref 0–53)
AST: 19 U/L (ref 0–37)
Albumin: 4 g/dL (ref 3.5–5.2)
Alkaline Phosphatase: 91 U/L (ref 39–117)
BUN: 20 mg/dL (ref 6–23)
CO2: 25 mEq/L (ref 19–32)
Calcium: 9.2 mg/dL (ref 8.4–10.5)
Chloride: 108 mEq/L (ref 96–112)
Creatinine, Ser: 0.99 mg/dL (ref 0.40–1.50)
GFR: 80.48 mL/min (ref >60.00)
Glucose, Bld: 79 mg/dL (ref 70–99)
Potassium: 4.3 mEq/L (ref 3.5–5.1)
Sodium: 140 mEq/L (ref 135–145)
Total Bilirubin: 0.7 mg/dL (ref 0.2–1.2)
Total Protein: 7.3 g/dL (ref 6.0–8.3)

## 2021-02-09 LAB — LIPID PANEL
Cholesterol: 183 mg/dL (ref 0–200)
HDL: 39 mg/dL — ABNORMAL LOW (ref >39.00)
LDL Cholesterol: 109 mg/dL — ABNORMAL HIGH (ref 0–99)
NonHDL: 144.3
Total CHOL/HDL Ratio: 5
Triglycerides: 179 mg/dL — ABNORMAL HIGH (ref 0.0–149.0)
VLDL: 35.8 mg/dL (ref 0.0–40.0)

## 2021-02-09 LAB — CBC
HCT: 44.9 % (ref 39.0–52.0)
Hemoglobin: 15 g/dL (ref 13.0–17.0)
MCHC: 33.3 g/dL (ref 30.0–36.0)
MCV: 90.3 fl (ref 78.0–100.0)
Platelets: 162 10*3/uL (ref 150.0–400.0)
RBC: 4.98 Mil/uL (ref 4.22–5.81)
RDW: 13.7 % (ref 11.5–15.5)
WBC: 9.1 10*3/uL (ref 4.0–10.5)

## 2021-03-17 ENCOUNTER — Inpatient Hospital Stay (HOSPITAL_BASED_OUTPATIENT_CLINIC_OR_DEPARTMENT_OTHER)
Admission: EM | Admit: 2021-03-17 | Discharge: 2021-03-19 | DRG: 439 | Disposition: A | Discharge status: Home / Self Care | Payer: 59 | Attending: Family Medicine | Admitting: Family Medicine

## 2021-03-17 ENCOUNTER — Encounter (HOSPITAL_BASED_OUTPATIENT_CLINIC_OR_DEPARTMENT_OTHER): Payer: Self-pay | Admitting: *Deleted

## 2021-03-17 ENCOUNTER — Emergency Department (HOSPITAL_BASED_OUTPATIENT_CLINIC_OR_DEPARTMENT_OTHER): Payer: 59

## 2021-03-17 ENCOUNTER — Ambulatory Visit
Admission: RE | Admit: 2021-03-17 | Discharge: 2021-03-17 | Disposition: A | Discharge status: Home / Self Care | Payer: 59 | Source: Ambulatory Visit | Attending: Internal Medicine | Admitting: Internal Medicine

## 2021-03-17 ENCOUNTER — Other Ambulatory Visit: Payer: Self-pay

## 2021-03-17 VITALS — BP 148/87 | HR 72 | Temp 98.1°F | Resp 18

## 2021-03-17 DIAGNOSIS — D72829 Elevated white blood cell count, unspecified: Secondary | ICD-10-CM | POA: Diagnosis present

## 2021-03-17 DIAGNOSIS — R109 Unspecified abdominal pain: Secondary | ICD-10-CM

## 2021-03-17 DIAGNOSIS — Z7982 Long term (current) use of aspirin: Secondary | ICD-10-CM

## 2021-03-17 DIAGNOSIS — M25552 Pain in left hip: Secondary | ICD-10-CM | POA: Diagnosis present

## 2021-03-17 DIAGNOSIS — Z888 Allergy status to other drugs, medicaments and biological substances status: Secondary | ICD-10-CM

## 2021-03-17 DIAGNOSIS — K219 Gastro-esophageal reflux disease without esophagitis: Secondary | ICD-10-CM | POA: Diagnosis present

## 2021-03-17 DIAGNOSIS — Z885 Allergy status to narcotic agent status: Secondary | ICD-10-CM

## 2021-03-17 DIAGNOSIS — Z6832 Body mass index (BMI) 32.0-32.9, adult: Secondary | ICD-10-CM

## 2021-03-17 DIAGNOSIS — J432 Centrilobular emphysema: Secondary | ICD-10-CM | POA: Diagnosis present

## 2021-03-17 DIAGNOSIS — E669 Obesity, unspecified: Secondary | ICD-10-CM | POA: Diagnosis present

## 2021-03-17 DIAGNOSIS — K8591 Acute pancreatitis with uninfected necrosis, unspecified: Secondary | ICD-10-CM | POA: Diagnosis not present

## 2021-03-17 DIAGNOSIS — I428 Other cardiomyopathies: Secondary | ICD-10-CM | POA: Diagnosis present

## 2021-03-17 DIAGNOSIS — F419 Anxiety disorder, unspecified: Secondary | ICD-10-CM | POA: Diagnosis present

## 2021-03-17 DIAGNOSIS — Z79899 Other long term (current) drug therapy: Secondary | ICD-10-CM

## 2021-03-17 DIAGNOSIS — R0789 Other chest pain: Secondary | ICD-10-CM | POA: Diagnosis not present

## 2021-03-17 DIAGNOSIS — Z720 Tobacco use: Secondary | ICD-10-CM | POA: Diagnosis present

## 2021-03-17 DIAGNOSIS — F32A Depression, unspecified: Secondary | ICD-10-CM | POA: Diagnosis present

## 2021-03-17 DIAGNOSIS — Z20822 Contact with and (suspected) exposure to covid-19: Secondary | ICD-10-CM | POA: Diagnosis present

## 2021-03-17 DIAGNOSIS — E785 Hyperlipidemia, unspecified: Secondary | ICD-10-CM | POA: Diagnosis present

## 2021-03-17 DIAGNOSIS — Z8249 Family history of ischemic heart disease and other diseases of the circulatory system: Secondary | ICD-10-CM

## 2021-03-17 DIAGNOSIS — I2510 Atherosclerotic heart disease of native coronary artery without angina pectoris: Secondary | ICD-10-CM | POA: Diagnosis present

## 2021-03-17 DIAGNOSIS — Z880 Allergy status to penicillin: Secondary | ICD-10-CM

## 2021-03-17 DIAGNOSIS — F17210 Nicotine dependence, cigarettes, uncomplicated: Secondary | ICD-10-CM | POA: Diagnosis present

## 2021-03-17 DIAGNOSIS — K859 Acute pancreatitis without necrosis or infection, unspecified: Secondary | ICD-10-CM | POA: Diagnosis not present

## 2021-03-17 DIAGNOSIS — I10 Essential (primary) hypertension: Secondary | ICD-10-CM | POA: Diagnosis present

## 2021-03-17 DIAGNOSIS — M1990 Unspecified osteoarthritis, unspecified site: Secondary | ICD-10-CM | POA: Diagnosis present

## 2021-03-17 DIAGNOSIS — Z825 Family history of asthma and other chronic lower respiratory diseases: Secondary | ICD-10-CM

## 2021-03-17 DIAGNOSIS — G4733 Obstructive sleep apnea (adult) (pediatric): Secondary | ICD-10-CM | POA: Diagnosis present

## 2021-03-17 LAB — COMPREHENSIVE METABOLIC PANEL
ALT: 14 U/L (ref 0–44)
AST: 16 U/L (ref 15–41)
Albumin: 3.5 g/dL (ref 3.5–5.0)
Alkaline Phosphatase: 94 U/L (ref 38–126)
Anion gap: 10 (ref 5–15)
BUN: 15 mg/dL (ref 8–23)
CO2: 21 mmol/L — ABNORMAL LOW (ref 22–32)
Calcium: 8.1 mg/dL — ABNORMAL LOW (ref 8.9–10.3)
Chloride: 107 mmol/L (ref 98–111)
Creatinine, Ser: 0.73 mg/dL (ref 0.61–1.24)
GFR, Estimated: >60 mL/min (ref >60)
Glucose, Bld: 100 mg/dL — ABNORMAL HIGH (ref 70–99)
Potassium: 3.9 mmol/L (ref 3.5–5.1)
Sodium: 138 mmol/L (ref 135–145)
Total Bilirubin: 1 mg/dL (ref 0.3–1.2)
Total Protein: 7.3 g/dL (ref 6.5–8.1)

## 2021-03-17 LAB — URINALYSIS, ROUTINE W REFLEX MICROSCOPIC
Glucose, UA: NEGATIVE mg/dL
Ketones, ur: NEGATIVE mg/dL
Leukocytes,Ua: NEGATIVE
Nitrite: NEGATIVE
Protein, ur: NEGATIVE mg/dL
Specific Gravity, Urine: 1.025 (ref 1.005–1.030)
pH: 5.5 (ref 5.0–8.0)

## 2021-03-17 LAB — CBC WITH DIFFERENTIAL/PLATELET
Abs Immature Granulocytes: 0.05 10*3/uL (ref 0.00–0.07)
Basophils Absolute: 0 10*3/uL (ref 0.0–0.1)
Basophils Relative: 0 %
Eosinophils Absolute: 0.1 10*3/uL (ref 0.0–0.5)
Eosinophils Relative: 0 %
HCT: 46.8 % (ref 39.0–52.0)
Hemoglobin: 15.6 g/dL (ref 13.0–17.0)
Immature Granulocytes: 0 %
Lymphocytes Relative: 11 %
Lymphs Abs: 1.5 10*3/uL (ref 0.7–4.0)
MCH: 30.2 pg (ref 26.0–34.0)
MCHC: 33.3 g/dL (ref 30.0–36.0)
MCV: 90.5 fL (ref 80.0–100.0)
Monocytes Absolute: 1.4 10*3/uL — ABNORMAL HIGH (ref 0.1–1.0)
Monocytes Relative: 10 %
Neutro Abs: 10.9 10*3/uL — ABNORMAL HIGH (ref 1.7–7.7)
Neutrophils Relative %: 79 %
Platelets: 152 10*3/uL (ref 150–400)
RBC: 5.17 MIL/uL (ref 4.22–5.81)
RDW: 13.2 % (ref 11.5–15.5)
WBC: 14 10*3/uL — ABNORMAL HIGH (ref 4.0–10.5)
nRBC: 0 % (ref 0.0–0.2)

## 2021-03-17 LAB — URINALYSIS, MICROSCOPIC (REFLEX): WBC, UA: NONE SEEN WBC/hpf (ref 0–5)

## 2021-03-17 LAB — RESP PANEL BY RT-PCR (FLU A&B, COVID) ARPGX2
Influenza A by PCR: NEGATIVE
Influenza B by PCR: NEGATIVE
SARS Coronavirus 2 by RT PCR: NEGATIVE

## 2021-03-17 LAB — TROPONIN I (HIGH SENSITIVITY)
Troponin I (High Sensitivity): 5 ng/L (ref <18)
Troponin I (High Sensitivity): 5 ng/L (ref <18)

## 2021-03-17 LAB — LIPASE, BLOOD: Lipase: 29 U/L (ref 11–51)

## 2021-03-17 MED ORDER — IOHEXOL 350 MG/ML SOLN
100.0000 mL | Freq: Once | INTRAVENOUS | Status: AC | PRN
  Administered 2021-03-17: 100 mL via INTRAVENOUS

## 2021-03-17 MED ORDER — LACTATED RINGERS IV SOLN: INTRAVENOUS | Status: DC

## 2021-03-17 MED ORDER — MORPHINE SULFATE (PF) 4 MG/ML IV SOLN
4.0000 mg | Freq: Once | INTRAVENOUS | Status: AC
  Administered 2021-03-17: 4 mg via INTRAVENOUS
  Filled 2021-03-17: qty 1

## 2021-03-17 MED ORDER — ONDANSETRON HCL 4 MG/2ML IJ SOLN
4.0000 mg | Freq: Once | INTRAMUSCULAR | Status: AC
  Administered 2021-03-17: 4 mg via INTRAVENOUS
  Filled 2021-03-17: qty 2

## 2021-03-17 NOTE — Discharge Instructions (Addendum)
Please go the hospital as soon as you leave urgent care for further evaluation and management.

## 2021-03-17 NOTE — ED Provider Notes (Signed)
[facility] EMERGENCY DEPARTMENT Provider Note   CSN: [id number] Arrival date & time: 03/17/21  1651     History Chief Complaint  Patient presents with   Abdominal Pain    Charles Grimes is a 65 y.o. male presenting for evaluation of abdominal and back pain.  Patient states that the past 4 days he has been having persistent diffuse abdominal pain and back pain.  It began after eating 4 days ago, and has continued to worsen.  Pain is constant, worse after eating, nothing makes it better.  He has tried Mylanta, heartburn medication, chamomile without improvement.  He states pain radiates into his left leg.  He denies fever, chills, cough.  He has had 1 episode of emesis, but otherwise denies nausea.  No change in urination or bowel movements.  No diagnosis of hypertension, but reports a family history of the same.  He is on blood pressure medication due to his history of cardiomyopathy.  No previous diagnosis of aneurysm.  Additional history taken chart reviewed.  Patient with a history of anxiety, arthritis, CAD, dyslipidemia, GERD, cardiomyopathy, OSA, COPD  HPI     Past Medical History:  Diagnosis Date   Anginal pain ([place])    Anxiety    Arthritis    Coronary artery disease, non-occlusive 08/2013   Mild to moderate (40% OM1) single vessel CAD. Otherwise nonobstructed.   Depression    Dyslipidemia, goal LDL below 100    Essential hypertension    Family history of premature CAD    GERD (gastroesophageal reflux disease)    H/O skin disorder    Involving hands. Subsequently resolved.    Nonischemic cardiomyopathy ([place]) 05/2013   Non-ischemic: EF ~45% by Echo & Myoview --> Non-obstructive CAD   Obesity (BMI 30.0-34.9)    OSA (obstructive sleep apnea) 06/21/2017    Patient Active Problem List   Diagnosis Date Noted   Centrilobular emphysema ([place]) 02/08/2021   Tobacco abuse 02/25/2018   Lateral epicondylitis of left elbow 02/25/2018   OSA (obstructive sleep apnea)  06/21/2017   History of colon polyps 12/29/2016   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   HNP (herniated nucleus pulposus), lumbar 10/07/2014   Prostate cancer screening 06/10/2014   Visit for preventive health examination 06/10/2014   Fall at home 05/16/2014   Anxiety and depression 01/11/2014   Colon cancer screening 01/11/2014   Former heavy cigarette smoker (20-39 per day) 09/18/2013   Coronary artery disease, non-occlusive 08/17/2013   Mild essential hypertension 07/28/2013   Dyslipidemia, goal LDL below 100 07/28/2013   Family history of premature CAD    Family history of factor V Leiden deficiency 06/17/2013   Obesity (BMI 30-39.9) 05/25/2013   Shortness of breath on exertion 05/23/2013   Intermittent claudication ([place]) 05/23/2013   Nonischemic cardiomyopathy - mild 05/17/2013    Past Surgical History:  Procedure Laterality Date   LEFT HEART CATHETERIZATION WITH CORONARY ANGIOGRAM  08/26/2013   Mild to moderate disease with 40-50% stenosis and OM1. Otherwise no significant CAD --  Surgeon: Leonie Man, MD;  Location: [facility] CATH LAB;  Service: Cardiovascular;;   Lower extremity arterial Dopplers  06/11/2013   No occlusive disease   LUMBAR LAMINECTOMY/DECOMPRESSION MICRODISCECTOMY Right 10/07/2014   Procedure: LUMBAR LAMINECTOMY/DECOMPRESSION MICRODISCECTOMY 1 LEVEL;  Surgeon: Kary Kos, MD;  Location: [facility] NEURO ORS;  Service: Neurosurgery;  Laterality: Right;  Right L3-L4 Microdiscectomy   NM MYOVIEW LTD  06/03/2013   Low risk study with no ischemia. EF roughly 44% with no regional  wall motion abnormalities noted   PFTs  06/2013   Normal Volumes &  Spirometry; Moderately reduced DLCO   SHOULDER HEMI-ARTHROPLASTY Right 05/28/2015   Procedure: RIGHT SHOULDER HEMI-ARTHROPLASTY CTA HEAD AND SUBSCAP REPAIR ;  Surgeon: Netta Cedars, MD;  Location: [facility];  Service: Orthopedics;  Laterality: Right;   TRANSTHORACIC ECHOCARDIOGRAM  06/11/2013   Mildly reduced EF: 45-50%. Mild anterior  hypokinesis with incoordinate septal motion. No evidence of pulmonary hypertension       Family History  Problem Relation Age of Onset   Atrial fibrillation Mother        Alive at 13   COPD Mother    Hypertension Mother    Colonic polyp Mother    Hypertension Father        Alive at 74   Lung cancer Father        Chronic smoker   Factor V Leiden deficiency Father        Also Lupus anticoagulant   Heart attack Maternal Grandmother 28   Heart failure Paternal Grandmother    Diabetes Paternal Grandmother    Heart attack Brother 59       X2   Heart attack Sister 57       Deceased   Healthy Sister        x2   Healthy Son        x2   Colon cancer Neg Hx    Esophageal cancer Neg Hx    Rectal cancer Neg Hx    Stomach cancer Neg Hx     Social History   Tobacco Use   Smoking status: Every Day    Packs/day: 1.00    Years: 42.00    Pack years: 42.00    Types: Cigarettes    Last attempt to quit: 08/27/2017    Years since quitting: 3.5   Smokeless tobacco: Never  Vaping Use   Vaping Use: Never used  Substance Use Topics   Alcohol use: Not Currently    Comment: rarely   Drug use: No    Home Medications Prior to Admission medications   Medication Sig Start Date End Date Taking? Authorizing Provider  aspirin EC 81 MG tablet Take 81 mg by mouth daily.   Yes [provider]  diclofenac Sodium (VOLTAREN) 1 % GEL Apply 2 g topically 4 (four) times daily. 01/03/21  Yes Couture, Cortni S, PA-C  losartan (COZAAR) 50 MG tablet Take 1 tablet by mouth once daily 11/23/20  Yes Wendling, Crosby Oyster, DO  nitroGLYCERIN (NITROSTAT) 0.4 MG SL tablet Place 1 tablet (0.4 mg total) under the tongue every 5 (five) minutes as needed for chest pain. 02/08/21  Yes [name], DO  omeprazole (PRILOSEC OTC) 20 MG tablet Take 1 tablet (20 mg total) by mouth daily. 06/20/17  Yes [name], DO  pravastatin (PRAVACHOL) 40 MG tablet Take 1 tablet (40 mg total) by mouth  daily. 10/07/20  Yes [name], DO  sertraline (ZOLOFT) 100 MG tablet Take 1 tablet (100 mg total) by mouth daily. 10/07/20  Yes [name], DO  sertraline (ZOLOFT) 50 MG tablet Take 1 tablet (50 mg total) by mouth daily. Take for a total of 150 mg daily. 10/07/20  Yes [name], DO  tamsulosin (FLOMAX) 0.4 MG CAPS capsule Take 1 tab daily in event of a stone. 02/08/21  Yes [name], DO  triamcinolone cream (KENALOG) 0.1 % Apply 1 application topically 3 (three) times daily. 02/08/21  Yes  [name], DO    Allergies    Isosorbide nitrate, Oxycodone, and Penicillins  Review of Systems   Review of Systems  Gastrointestinal:  Positive for abdominal pain and vomiting (x1).  Musculoskeletal:  Positive for back pain.  All other systems reviewed and are negative.  Physical Exam Updated Vital Signs Ht '5\' 11"'$  (1.803 m)   Wt 105.3 kg   BMI 32.38 kg/m   Physical Exam Vitals and nursing note reviewed.  Constitutional:      General: He is not in acute distress.    Appearance: Normal appearance.     Comments: Nontoxic  HENT:     Head: Normocephalic and atraumatic.  Eyes:     Conjunctiva/sclera: Conjunctivae normal.     Pupils: Pupils are equal, round, and reactive to light.  Cardiovascular:     Rate and Rhythm: Normal rate and regular rhythm.     Pulses: Normal pulses.  Pulmonary:     Effort: Pulmonary effort is normal. No respiratory distress.     Breath sounds: Normal breath sounds. No wheezing.     Comments: Speaking in full sentences.  Clear lung sounds in all fields.  Tenderness palpation of the anterior chest wall Chest:     Chest wall: Tenderness present.  Abdominal:     General: There is no distension.     Palpations: Abdomen is soft.     Tenderness: There is generalized abdominal tenderness.     Comments: Diffuse tenderness palpation of the entire abdomen, worse in the epigastric region.  Musculoskeletal:         General: Tenderness present. Normal range of motion.     Cervical back: Normal range of motion and neck supple.     Comments: Diffuse tenderness palpation of the entire back. Pedal pulses 2+ bilaterally.  Skin:    General: Skin is warm and dry.     Capillary Refill: Capillary refill takes less than 2 seconds.  Neurological:     Mental Status: He is alert and oriented to person, place, and time.  Psychiatric:        Mood and Affect: Mood and affect normal.        Speech: Speech normal.        Behavior: Behavior normal.    ED Results / Procedures / Treatments   Labs (all labs ordered are listed, but only abnormal results are displayed) Labs Reviewed - No data to display  EKG None  Radiology No results found.  Procedures Procedures   Medications Ordered in ED Medications - No data to display  ED Course  I have reviewed the triage vital signs and the nursing notes.  Pertinent labs & imaging results that were available during my care of the patient were reviewed by me and considered in my medical decision making (see chart for details).    MDM Rules/Calculators/A&P                           Patient presenting for evaluation of chest and abdominal pain.  On exam, patient appears nontoxic.  He does have diffuse tenderness palpation of the abdomen, back, and anterior chest wall.  Symptoms are worse after eating, favor GI cause.  However in the setting of the patient is currently hypertensive with chest pain and back pain, will obtain CT dissection study.  We will also obtain labs to look at kidney function, lipase, liver function.  Will treat symptomatically and reassess.  On reassessment,  patient reports improvement of symptoms.  Labs show mild leukocytosis of 14, nonspecific.  Otherwise reassuring.  CTA of the chest abdomen pelvis pending.  Pt signed out to Coralyn Helling, MD for f/u on CT  Final Clinical Impression(s) / ED Diagnoses Final diagnoses:  None    Rx / DC  Orders ED Discharge Orders     None        Franchot Heidelberg, PA-C 03/17/21 1950    Long, Wonda Olds, MD 03/18/21 1228

## 2021-03-17 NOTE — ED Triage Notes (Signed)
Pt c/o abdominal pain. When asked to point to the worst pain he pointed to his anatomical stomach, bilateral upper abd quads and bilat lower quads. Described as 9/10 stabbing throbbing pain. States he cannot get comfortable, nor sleep. Has tried tylenol at home without relief.

## 2021-03-17 NOTE — ED Provider Notes (Signed)
[facility]    CSN: [id number] Arrival date & time: 03/17/21  1448      History   Chief Complaint Chief Complaint  Patient presents with   Abdominal Pain    HPI Charles Grimes is a 65 y.o. male.   Patient presents with severe continuous abdominal pain that is generalized that has been present for approximately 4 days.  Pain is worsened today.  Patient states that he feels lightheaded and "like he is going to pass out" intermittently.  Nausea started today but patient has not had any vomiting.  Denies any diarrhea or constipation.  No blood noted in stool.  Last bowel movement was this morning and was normal in consistency.  Pain is 9/10 and is described as a "stabbing and throbbing pain".  Also having some left chest pain.  Patient does have history of CAD, cardiomyopathy, angina.  Patient states that he took 2 nitroglycerin yesterday that did not relieve chest pain.  Has also taken Tylenol without relief of pain.   Abdominal Pain  Past Medical History:  Diagnosis Date   Anginal pain ([place])    Anxiety    Arthritis    Coronary artery disease, non-occlusive 08/2013   Mild to moderate (40% OM1) single vessel CAD. Otherwise nonobstructed.   Depression    Dyslipidemia, goal LDL below 100    Essential hypertension    Family history of premature CAD    GERD (gastroesophageal reflux disease)    H/O skin disorder    Involving hands. Subsequently resolved.    Nonischemic cardiomyopathy ([place]) 05/2013   Non-ischemic: EF ~45% by Echo & Myoview --> Non-obstructive CAD   Obesity (BMI 30.0-34.9)    OSA (obstructive sleep apnea) 06/21/2017    Patient Active Problem List   Diagnosis Date Noted   Centrilobular emphysema ([place]) 02/08/2021   Tobacco abuse 02/25/2018   Lateral epicondylitis of left elbow 02/25/2018   OSA (obstructive sleep apnea) 06/21/2017   History of colon polyps 12/29/2016   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   HNP (herniated nucleus pulposus), lumbar  10/07/2014   Prostate cancer screening 06/10/2014   Visit for preventive health examination 06/10/2014   Fall at home 05/16/2014   Anxiety and depression 01/11/2014   Colon cancer screening 01/11/2014   Former heavy cigarette smoker (20-39 per day) 09/18/2013   Coronary artery disease, non-occlusive 08/17/2013   Mild essential hypertension 07/28/2013   Dyslipidemia, goal LDL below 100 07/28/2013   Family history of premature CAD    Family history of factor V Leiden deficiency 06/17/2013   Obesity (BMI 30-39.9) 05/25/2013   Shortness of breath on exertion 05/23/2013   Intermittent claudication ([place]) 05/23/2013   Nonischemic cardiomyopathy - mild 05/17/2013    Past Surgical History:  Procedure Laterality Date   LEFT HEART CATHETERIZATION WITH CORONARY ANGIOGRAM  08/26/2013   Mild to moderate disease with 40-50% stenosis and OM1. Otherwise no significant CAD --  Surgeon: Leonie Man, MD;  Location[facility] CATH LAB;  Service: Cardiovascular;;   Lower extremity arterial Dopplers  06/11/2013   No occlusive disease   LUMBAR LAMINECTOMY/DECOMPRESSION MICRODISCECTOMY Right 10/07/2014   Procedure: LUMBAR LAMINECTOMY/DECOMPRESSION MICRODISCECTOMY 1 LEVEL;  Surgeon: Kary Kos, MD;  Location: [facility] NEURO ORS;  Service: Neurosurgery;  Laterality: Right;  Right L3-L4 Microdiscectomy   NM MYOVIEW LTD  06/03/2013   Low risk study with no ischemia. EF roughly 44% with no regional wall motion abnormalities noted   PFTs  06/2013   Normal Volumes &  Spirometry; Moderately  reduced DLCO   SHOULDER HEMI-ARTHROPLASTY Right 05/28/2015   Procedure: RIGHT SHOULDER HEMI-ARTHROPLASTY CTA HEAD AND SUBSCAP REPAIR ;  Surgeon: Netta Cedars, MD;  Location: [facility];  Service: Orthopedics;  Laterality: Right;   TRANSTHORACIC ECHOCARDIOGRAM  06/11/2013   Mildly reduced EF: 45-50%. Mild anterior hypokinesis with incoordinate septal motion. No evidence of pulmonary hypertension       Home Medications    Prior to Admission  medications   Medication Sig Start Date End Date Taking? Authorizing Provider  aspirin EC 81 MG tablet Take 81 mg by mouth daily.    [provider]  diclofenac Sodium (VOLTAREN) 1 % GEL Apply 2 g topically 4 (four) times daily. 01/03/21   Couture, Cortni S, PA-C  losartan (COZAAR) 50 MG tablet Take 1 tablet by mouth once daily 11/23/20   Wendling, Crosby Oyster, DO  nitroGLYCERIN (NITROSTAT) 0.4 MG SL tablet Place 1 tablet (0.4 mg total) under the tongue every 5 (five) minutes as needed for chest pain. 02/08/21   [name], DO  omeprazole (PRILOSEC OTC) 20 MG tablet Take 1 tablet (20 mg total) by mouth daily. 06/20/17   [name], DO  pravastatin (PRAVACHOL) 40 MG tablet Take 1 tablet (40 mg total) by mouth daily. 10/07/20   [name], DO  sertraline (ZOLOFT) 100 MG tablet Take 1 tablet (100 mg total) by mouth daily. 10/07/20   [name], DO  sertraline (ZOLOFT) 50 MG tablet Take 1 tablet (50 mg total) by mouth daily. Take for a total of 150 mg daily. 10/07/20   [name], DO  tamsulosin (FLOMAX) 0.4 MG CAPS capsule Take 1 tab daily in event of a stone. 02/08/21   [name], DO  triamcinolone cream (KENALOG) 0.1 % Apply 1 application topically 3 (three) times daily. 02/08/21   [name], DO    Family History Family History  Problem Relation Age of Onset   Atrial fibrillation Mother        Alive at 52   COPD Mother    Hypertension Mother    Colonic polyp Mother    Hypertension Father        Alive at 28   Lung cancer Father        Chronic smoker   Factor V Leiden deficiency Father        Also Lupus anticoagulant   Heart attack Maternal Grandmother 33   Heart failure Paternal Grandmother    Diabetes Paternal Grandmother    Heart attack Brother 38       X2   Heart attack Sister 65       Deceased   Healthy Sister        x2   Healthy Son        x2   Colon cancer Neg Hx    Esophageal  cancer Neg Hx    Rectal cancer Neg Hx    Stomach cancer Neg Hx     Social History Social History   Tobacco Use   Smoking status: Every Day    Packs/day: 1.00    Years: 42.00    Pack years: 42.00    Types: Cigarettes    Last attempt to quit: 08/27/2017    Years since quitting: 3.5   Smokeless tobacco: Never  Vaping Use   Vaping Use: Never used  Substance Use Topics   Alcohol use: Yes    Alcohol/week: 0.0 standard drinks    Comment: rarely   Drug use: No  Allergies   Isosorbide nitrate, Oxycodone, and Penicillins   Review of Systems Review of Systems Per HPI  Physical Exam Triage Vital Signs ED Triage Vitals  Enc Vitals Group     BP 03/17/21 1522 (!) 148/87     Pulse Rate 03/17/21 1522 72     Resp 03/17/21 1522 18     Temp 03/17/21 1522 98.1 F (36.7 C)     Temp Source 03/17/21 1522 Oral     SpO2 03/17/21 1522 96 %     Weight --      Height --      Head Circumference --      Peak Flow --      Pain Score 03/17/21 1523 9     Pain Loc --      Pain Edu? --      Excl. in [place]? --    No data found.  Updated Vital Signs BP (!) 148/87 (BP Location: Left Arm)   Pulse 72   Temp 98.1 F (36.7 C) (Oral)   Resp 18   SpO2 96%   Visual Acuity Right Eye Distance:   Left Eye Distance:   Bilateral Distance:    Right Eye Near:   Left Eye Near:    Bilateral Near:     Physical Exam Constitutional:      Appearance: Normal appearance.  HENT:     Head: Normocephalic and atraumatic.  Eyes:     Extraocular Movements: Extraocular movements intact.     Conjunctiva/sclera: Conjunctivae normal.  Cardiovascular:     Rate and Rhythm: Normal rate and regular rhythm.     Pulses: Normal pulses.     Heart sounds: Normal heart sounds.  Pulmonary:     Effort: Pulmonary effort is normal.     Breath sounds: Normal breath sounds.  Abdominal:     General: Bowel sounds are normal. There is no distension.     Palpations: Abdomen is soft.     Tenderness: There is  generalized abdominal tenderness. Negative signs include Murphy's sign and McBurney's sign.  Skin:    General: Skin is warm and dry.  Neurological:     General: No focal deficit present.     Mental Status: He is alert and oriented to person, place, and time. Mental status is at baseline.  Psychiatric:        Mood and Affect: Mood normal.        Behavior: Behavior normal.        Thought Content: Thought content normal.        Judgment: Judgment normal.     UC Treatments / Results  Labs (all labs ordered are listed, but only abnormal results are displayed) Labs Reviewed - No data to display  EKG   Radiology No results found.  Procedures Procedures (including critical care time)  Medications Ordered in UC Medications - No data to display  Initial Impression / Assessment and Plan / UC Course  I have reviewed the triage vital signs and the nursing notes.  Pertinent labs & imaging results that were available during my care of the patient were reviewed by me and considered in my medical decision making (see chart for details).     EKG was normal sinus rhythm.  Patient was advised to go to the hospital for further evaluation and management of severe abdominal pain.  Patient may need imaging of abdomen.  Vital signs were stable, so agree with wife transporting patient to the hospital.  Patient was agreeable  with plan. Final Clinical Impressions(s) / UC Diagnoses   Final diagnoses:  Continuous severe abdominal pain  Other chest pain     Discharge Instructions      Please go the hospital as soon as you leave urgent care for further evaluation and management.     ED Prescriptions   None    PDMP not reviewed this encounter.   Odis Luster, [place] 03/17/21 [phone]

## 2021-03-17 NOTE — ED Triage Notes (Signed)
Abdominal pain x 2 days. He was seen at UC earlier and had a normal EKG. Belching for the past 30 minutes. Generalized abdominal and back pain.

## 2021-03-17 NOTE — Plan of Care (Signed)
[facility] will assume care on arrival to accepting facility. Until arrival, care as per EDP. However, [facility] available 24/7 for questions and assistance.  Nursing staff please page [facility] Admits and Consults ([phone]) as soon as the patient arrives the hospital.   

## 2021-03-18 DIAGNOSIS — Z20822 Contact with and (suspected) exposure to covid-19: Secondary | ICD-10-CM | POA: Diagnosis present

## 2021-03-18 DIAGNOSIS — F419 Anxiety disorder, unspecified: Secondary | ICD-10-CM

## 2021-03-18 DIAGNOSIS — G4733 Obstructive sleep apnea (adult) (pediatric): Secondary | ICD-10-CM

## 2021-03-18 DIAGNOSIS — E669 Obesity, unspecified: Secondary | ICD-10-CM | POA: Diagnosis present

## 2021-03-18 DIAGNOSIS — F32A Depression, unspecified: Secondary | ICD-10-CM | POA: Diagnosis present

## 2021-03-18 DIAGNOSIS — I10 Essential (primary) hypertension: Secondary | ICD-10-CM | POA: Diagnosis present

## 2021-03-18 DIAGNOSIS — Z79899 Other long term (current) drug therapy: Secondary | ICD-10-CM | POA: Diagnosis not present

## 2021-03-18 DIAGNOSIS — Z6832 Body mass index (BMI) 32.0-32.9, adult: Secondary | ICD-10-CM | POA: Diagnosis not present

## 2021-03-18 DIAGNOSIS — Z888 Allergy status to other drugs, medicaments and biological substances status: Secondary | ICD-10-CM | POA: Diagnosis not present

## 2021-03-18 DIAGNOSIS — Z7982 Long term (current) use of aspirin: Secondary | ICD-10-CM | POA: Diagnosis not present

## 2021-03-18 DIAGNOSIS — Z825 Family history of asthma and other chronic lower respiratory diseases: Secondary | ICD-10-CM | POA: Diagnosis not present

## 2021-03-18 DIAGNOSIS — I2510 Atherosclerotic heart disease of native coronary artery without angina pectoris: Secondary | ICD-10-CM | POA: Diagnosis present

## 2021-03-18 DIAGNOSIS — K219 Gastro-esophageal reflux disease without esophagitis: Secondary | ICD-10-CM | POA: Diagnosis present

## 2021-03-18 DIAGNOSIS — K8591 Acute pancreatitis with uninfected necrosis, unspecified: Secondary | ICD-10-CM | POA: Diagnosis present

## 2021-03-18 DIAGNOSIS — M25552 Pain in left hip: Secondary | ICD-10-CM | POA: Diagnosis present

## 2021-03-18 DIAGNOSIS — I428 Other cardiomyopathies: Secondary | ICD-10-CM | POA: Diagnosis present

## 2021-03-18 DIAGNOSIS — E785 Hyperlipidemia, unspecified: Secondary | ICD-10-CM | POA: Diagnosis present

## 2021-03-18 DIAGNOSIS — K859 Acute pancreatitis without necrosis or infection, unspecified: Secondary | ICD-10-CM | POA: Diagnosis present

## 2021-03-18 DIAGNOSIS — M1990 Unspecified osteoarthritis, unspecified site: Secondary | ICD-10-CM | POA: Diagnosis present

## 2021-03-18 DIAGNOSIS — J432 Centrilobular emphysema: Secondary | ICD-10-CM | POA: Diagnosis present

## 2021-03-18 DIAGNOSIS — F17210 Nicotine dependence, cigarettes, uncomplicated: Secondary | ICD-10-CM | POA: Diagnosis present

## 2021-03-18 DIAGNOSIS — Z885 Allergy status to narcotic agent status: Secondary | ICD-10-CM | POA: Diagnosis not present

## 2021-03-18 DIAGNOSIS — Z880 Allergy status to penicillin: Secondary | ICD-10-CM | POA: Diagnosis not present

## 2021-03-18 DIAGNOSIS — D72829 Elevated white blood cell count, unspecified: Secondary | ICD-10-CM | POA: Diagnosis present

## 2021-03-18 DIAGNOSIS — Z720 Tobacco use: Secondary | ICD-10-CM

## 2021-03-18 DIAGNOSIS — Z8249 Family history of ischemic heart disease and other diseases of the circulatory system: Secondary | ICD-10-CM | POA: Diagnosis not present

## 2021-03-18 LAB — CBC WITH DIFFERENTIAL/PLATELET
Abs Immature Granulocytes: 0.06 10*3/uL (ref 0.00–0.07)
Basophils Absolute: 0.1 10*3/uL (ref 0.0–0.1)
Basophils Relative: 0 %
Eosinophils Absolute: 0.1 10*3/uL (ref 0.0–0.5)
Eosinophils Relative: 1 %
HCT: 43.3 % (ref 39.0–52.0)
Hemoglobin: 13.9 g/dL (ref 13.0–17.0)
Immature Granulocytes: 1 %
Lymphocytes Relative: 14 %
Lymphs Abs: 1.7 10*3/uL (ref 0.7–4.0)
MCH: 29.4 pg (ref 26.0–34.0)
MCHC: 32.1 g/dL (ref 30.0–36.0)
MCV: 91.5 fL (ref 80.0–100.0)
Monocytes Absolute: 1.1 10*3/uL — ABNORMAL HIGH (ref 0.1–1.0)
Monocytes Relative: 9 %
Neutro Abs: 9.2 10*3/uL — ABNORMAL HIGH (ref 1.7–7.7)
Neutrophils Relative %: 75 %
Platelets: 131 10*3/uL — ABNORMAL LOW (ref 150–400)
RBC: 4.73 MIL/uL (ref 4.22–5.81)
RDW: 13.2 % (ref 11.5–15.5)
WBC: 12.3 10*3/uL — ABNORMAL HIGH (ref 4.0–10.5)
nRBC: 0 % (ref 0.0–0.2)

## 2021-03-18 LAB — LIPID PANEL
Cholesterol: 169 mg/dL (ref 0–200)
HDL: 36 mg/dL — ABNORMAL LOW (ref >40)
LDL Cholesterol: 116 mg/dL — ABNORMAL HIGH (ref 0–99)
Total CHOL/HDL Ratio: 4.7 RATIO
Triglycerides: 85 mg/dL (ref <150)
VLDL: 17 mg/dL (ref 0–40)

## 2021-03-18 LAB — HIV ANTIBODY (ROUTINE TESTING W REFLEX): HIV Screen 4th Generation wRfx: NONREACTIVE

## 2021-03-18 MED ORDER — ALBUTEROL SULFATE (2.5 MG/3ML) 0.083% IN NEBU: 2.5000 mg | INHALATION_SOLUTION | Freq: Four times a day (QID) | RESPIRATORY_TRACT | Status: DC | PRN

## 2021-03-18 MED ORDER — PRAVASTATIN SODIUM 40 MG PO TABS
40.0000 mg | ORAL_TABLET | Freq: Every day | ORAL | Status: DC
  Administered 2021-03-18 – 2021-03-19 (×2): 40 mg via ORAL
  Filled 2021-03-18 (×2): qty 1

## 2021-03-18 MED ORDER — ACETAMINOPHEN 650 MG RE SUPP: 650.0000 mg | Freq: Four times a day (QID) | RECTAL | Status: DC | PRN

## 2021-03-18 MED ORDER — CALCIUM GLUCONATE-NACL 2-0.675 GM/100ML-% IV SOLN
2.0000 g | Freq: Once | INTRAVENOUS | Status: AC
  Administered 2021-03-18: 2000 mg via INTRAVENOUS
  Filled 2021-03-18: qty 100

## 2021-03-18 MED ORDER — ACETAMINOPHEN 325 MG PO TABS
650.0000 mg | ORAL_TABLET | Freq: Once | ORAL | Status: AC
  Administered 2021-03-18: 650 mg via ORAL
  Filled 2021-03-18: qty 2

## 2021-03-18 MED ORDER — MORPHINE SULFATE (PF) 2 MG/ML IV SOLN
2.0000 mg | INTRAVENOUS | Status: DC | PRN
  Administered 2021-03-18 – 2021-03-19 (×4): 2 mg via INTRAVENOUS
  Filled 2021-03-18 (×4): qty 1

## 2021-03-18 MED ORDER — ENOXAPARIN SODIUM 60 MG/0.6ML IJ SOSY
50.0000 mg | PREFILLED_SYRINGE | Freq: Every day | INTRAMUSCULAR | Status: DC
  Administered 2021-03-18 – 2021-03-19 (×2): 50 mg via SUBCUTANEOUS
  Filled 2021-03-18 (×2): qty 0.6

## 2021-03-18 MED ORDER — ONDANSETRON HCL 4 MG/2ML IJ SOLN: 4.0000 mg | Freq: Four times a day (QID) | INTRAMUSCULAR | Status: DC | PRN

## 2021-03-18 MED ORDER — OMEPRAZOLE MAGNESIUM 20 MG PO TBEC: 20.0000 mg | DELAYED_RELEASE_TABLET | Freq: Every day | ORAL | Status: DC

## 2021-03-18 MED ORDER — ONDANSETRON HCL 4 MG PO TABS: 4.0000 mg | ORAL_TABLET | Freq: Four times a day (QID) | ORAL | Status: DC | PRN

## 2021-03-18 MED ORDER — ASPIRIN EC 81 MG PO TBEC
81.0000 mg | DELAYED_RELEASE_TABLET | Freq: Every day | ORAL | Status: DC
  Administered 2021-03-18 – 2021-03-19 (×2): 81 mg via ORAL
  Filled 2021-03-18 (×2): qty 1

## 2021-03-18 MED ORDER — NICOTINE 21 MG/24HR TD PT24
21.0000 mg | MEDICATED_PATCH | Freq: Every day | TRANSDERMAL | Status: DC
  Administered 2021-03-18 – 2021-03-19 (×2): 21 mg via TRANSDERMAL
  Filled 2021-03-18 (×2): qty 1

## 2021-03-18 MED ORDER — METHOCARBAMOL 500 MG PO TABS
500.0000 mg | ORAL_TABLET | Freq: Three times a day (TID) | ORAL | Status: DC | PRN
  Administered 2021-03-18 – 2021-03-19 (×3): 500 mg via ORAL
  Filled 2021-03-18 (×3): qty 1

## 2021-03-18 MED ORDER — ACETAMINOPHEN 325 MG PO TABS
650.0000 mg | ORAL_TABLET | Freq: Four times a day (QID) | ORAL | Status: DC | PRN
  Administered 2021-03-18: 650 mg via ORAL
  Filled 2021-03-18: qty 2

## 2021-03-18 MED ORDER — PANTOPRAZOLE SODIUM 20 MG PO TBEC
20.0000 mg | DELAYED_RELEASE_TABLET | Freq: Every day | ORAL | Status: DC
  Administered 2021-03-18 – 2021-03-19 (×2): 20 mg via ORAL
  Filled 2021-03-18 (×2): qty 1

## 2021-03-18 MED ORDER — SODIUM CHLORIDE 0.9% FLUSH
3.0000 mL | Freq: Two times a day (BID) | INTRAVENOUS | Status: DC
  Administered 2021-03-18: 3 mL via INTRAVENOUS

## 2021-03-18 MED ORDER — LOSARTAN POTASSIUM 50 MG PO TABS
50.0000 mg | ORAL_TABLET | Freq: Every day | ORAL | Status: DC
  Administered 2021-03-18 – 2021-03-19 (×2): 50 mg via ORAL
  Filled 2021-03-18 (×2): qty 1

## 2021-03-18 MED ORDER — SERTRALINE HCL 100 MG PO TABS
100.0000 mg | ORAL_TABLET | ORAL | Status: DC
  Administered 2021-03-19: 100 mg via ORAL
  Filled 2021-03-18: qty 1

## 2021-03-18 MED ORDER — SERTRALINE HCL 50 MG PO TABS
50.0000 mg | ORAL_TABLET | Freq: Every evening | ORAL | Status: DC
  Administered 2021-03-18: 50 mg via ORAL
  Filled 2021-03-18: qty 1

## 2021-03-18 MED ORDER — MORPHINE SULFATE (PF) 4 MG/ML IV SOLN
4.0000 mg | INTRAVENOUS | Status: DC | PRN
Start: 2021-03-18 — End: 2021-03-18
  Administered 2021-03-18 (×4): 4 mg via INTRAVENOUS
  Filled 2021-03-18 (×4): qty 1

## 2021-03-18 MED ORDER — HYDROCODONE-ACETAMINOPHEN 5-325 MG PO TABS
1.0000 | ORAL_TABLET | Freq: Four times a day (QID) | ORAL | Status: DC | PRN
  Administered 2021-03-18 – 2021-03-19 (×2): 1 via ORAL
  Filled 2021-03-18 (×2): qty 1

## 2021-03-18 MED ORDER — TRIAMCINOLONE ACETONIDE 0.1 % EX CREA
1.0000 "application " | TOPICAL_CREAM | Freq: Three times a day (TID) | CUTANEOUS | Status: DC | PRN
  Filled 2021-03-18: qty 15

## 2021-03-18 NOTE — Progress Notes (Addendum)
Medication Consult: Pancreatitis Risk with Home Medications  Charles Grimes is a 65 yr old male admitted on 03/17/21 with abdominal pain. Lipase in ED was WNL (29); CTA with concern for pancreatitis with small area of developing necrosis. Pharmacy has been consulted to review pt's home med list for any meds that may cause pancreatitis.  Below is the information available re: risk of pancreatitis with each of the pt's home meds:  Diclofenac 1% gel:  Literature reports indicate that diclofenac is the major NSAID responsible for inducing acute pancreatitis in the general population. Absorption of diclofenac from 1% gel is 5-17 times lower than absorption from a diclofenac 50 mg tablet, but systemic absorption does occur from diclofenac gel. Also, there are literature reports of acute pancreatitis in pts receiving rectal diclofenac.   Losartan, pravastatin, omeprazole, ASA: These medications have been associated with rare cases of acute pancreatitis. ASA has been shown to be protective against pancreatitis in animal studies.  Sertraline: Cases of acute pancreatitis have been reported with sertraline, especially in cases where pts are taking high doses or in overdose situations. Pt's home dose is 100 mg/day, which is a relatively high dose.  Acetaminophen:  Acetaminophen has been reported to cause pancreatitis (without liver damage) in doses <4 gm/day.  From review of literature and case reports, all of the pt's home meds have been associated with causing acute pancreatitis, although rarely in most cases, with diclofenac being the major NSAID causing pancreatitis in the general population.  Gillermina Hu, PharmD, BCPS, [facility] Clinical Pharmacist

## 2021-03-18 NOTE — Progress Notes (Signed)
Pt places self on home CPAP. Will call this RT if help needed

## 2021-03-18 NOTE — H&P (Addendum)
History and Physical    Charles Grimes [id number] DOB: Mar 16, 1956 DOA: 03/17/2021  Referring MD/NP/PA: Shela Leff, MD PCP: [name], DO  Patient coming from: Med center transfer  Chief Complaint: Abdominal pain  I have personally briefly reviewed patient's old medical records in [place]   HPI: Charles Grimes is a 65 y.o. male with medical history significant of hypertension, hyperlipidemia, CAD, NICM, arthritis, and obesity who presents with complaints of 4-5 day history of abdominal pain.  He believes symptoms started after he had eaten and complains of having a sharp pain that moves across his whole abdomen and radiates to his back and shoulder.  He has also nausea and at least one episode of vomiting.  Emesis was noted to be yellow in color.  Patient reports that he still has his gallbladder and denies any prior abdominal surgery.  He does not drink any alcohol, but does admit to smoking 1 pack cigarettes per day for over 40 years.  He has tried to quit but has not been successful as of yet.  He has not started on any new medications.  He has been dealing with some left hip pain cramping for which he has been using Voltaren gel maybe 6 times in the last week  ED Course: Upon admission into the emergency department patient was seen to be afebrile with blood pressures initially elevated up to 198/83, and all other vital signs relatively maintained.  Labs significant for WBC 14, calcium 8.1, lipase 29, and high-sensitivity troponin negative x2.  Urinalysis showed no signs of infection.  Influenza and COVID-19 screening were negative.  CT angiogram of the chest, abdomen, and pelvis significant for concern for pancreatitis with small area of developing necrosis.  Patient has been given Tylenol, morphine, Zofran, and started on lactated Ringer's at 125 mL/h.   Review of Systems  Constitutional:  Negative for fever and malaise/fatigue.  HENT:  Negative for ear  discharge and nosebleeds.   Eyes:  Negative for photophobia and pain.  Respiratory:  Negative for cough and shortness of breath.   Cardiovascular:  Positive for chest pain. Negative for leg swelling.  Gastrointestinal:  Positive for abdominal pain, nausea and vomiting. Negative for diarrhea.  Genitourinary:  Positive for dysuria (Mild). Negative for frequency.  Musculoskeletal:  Positive for back pain and joint pain. Negative for myalgias.  Neurological:  Negative for focal weakness and loss of consciousness.  Psychiatric/Behavioral:  Positive for substance abuse. Negative for memory loss.    Past Medical History:  Diagnosis Date   Anginal pain ([place])    Anxiety    Arthritis    Coronary artery disease, non-occlusive 08/2013   Mild to moderate (40% OM1) single vessel CAD. Otherwise nonobstructed.   Depression    Dyslipidemia, goal LDL below 100    Essential hypertension    Family history of premature CAD    GERD (gastroesophageal reflux disease)    H/O skin disorder    Involving hands. Subsequently resolved.    Nonischemic cardiomyopathy ([place]) 05/2013   Non-ischemic: EF ~45% by Echo & Myoview --> Non-obstructive CAD   Obesity (BMI 30.0-34.9)    OSA (obstructive sleep apnea) 06/21/2017    Past Surgical History:  Procedure Laterality Date   LEFT HEART CATHETERIZATION WITH CORONARY ANGIOGRAM  08/26/2013   Mild to moderate disease with 40-50% stenosis and OM1. Otherwise no significant CAD --  Surgeon: Leonie Man, MD;  Location[facility] C[facility] LAB;  Service: Cardiovascular;;   Lower extremity arterial  Dopplers  06/11/2013   No occlusive disease   LUMBAR LAMINECTOMY/DECOMPRESSION MICRODISCECTOMY Right 10/07/2014   Procedure: LUMBAR LAMINECTOMY/DECOMPRESSION MICRODISCECTOMY 1 LEVEL;  Surgeon: Kary Kos, MD;  Location: [place] NEURO ORS;  Service: Neurosurgery;  Laterality: Right;  Right L3-L4 Microdiscectomy   NM MYOVIEW LTD  06/03/2013   Low risk study with no ischemia. EF roughly 44% with no  regional wall motion abnormalities noted   PFTs  06/2013   Normal Volumes &  Spirometry; Moderately reduced DLCO   SHOULDER HEMI-ARTHROPLASTY Right 05/28/2015   Procedure: RIGHT SHOULDER HEMI-ARTHROPLASTY CTA HEAD AND SUBSCAP REPAIR ;  Surgeon: Netta Cedars, MD;  Location: [facility];  Service: Orthopedics;  Laterality: Right;   TRANSTHORACIC ECHOCARDIOGRAM  06/11/2013   Mildly reduced EF: 45-50%. Mild anterior hypokinesis with incoordinate septal motion. No evidence of pulmonary hypertension     reports that he has been smoking cigarettes. He has a 42.00 pack-year smoking history. He has never used smokeless tobacco. He reports that he does not currently use alcohol. He reports that he does not use drugs.  Allergies  Allergen Reactions   Isosorbide Nitrate Other (See Comments)    CONTINUOUS HEADACHE    Oxycodone Itching    Reaction was to straight oxycodone 15 mg tablets - no reaction to percocet   Penicillins Hives and Rash    Has patient had a PCN reaction causing immediate rash, facial/tongue/throat swelling, SOB or lightheadedness with hypotension: Yes Has patient had a PCN reaction causing severe rash involving mucus membranes or skin necrosis: No Has patient had a PCN reaction that required hospitalization No Has patient had a PCN reaction occurring within the last 10 years: No If all of the above answers are "NO", then may proceed with Cephalosporin use.    Family History  Problem Relation Age of Onset   Atrial fibrillation Mother        Alive at 82   COPD Mother    Hypertension Mother    Colonic polyp Mother    Hypertension Father        Alive at 17   Lung cancer Father        Chronic smoker   Factor V Leiden deficiency Father        Also Lupus anticoagulant   Heart attack Maternal Grandmother 48   Heart failure Paternal Grandmother    Diabetes Paternal Grandmother    Heart attack Brother 5       X2   Heart attack Sister 80       Deceased   Healthy Sister        x2    Healthy Son        x2   Colon cancer Neg Hx    Esophageal cancer Neg Hx    Rectal cancer Neg Hx    Stomach cancer Neg Hx     Prior to Admission medications   Medication Sig Start Date End Date Taking? Authorizing Provider  aspirin EC 81 MG tablet Take 81 mg by mouth daily.   Yes [provider]  diclofenac Sodium (VOLTAREN) 1 % GEL Apply 2 g topically 4 (four) times daily. 01/03/21  Yes Couture, Cortni S, PA-C  losartan (COZAAR) 50 MG tablet Take 1 tablet by mouth once daily 11/23/20  Yes Wendling, Crosby Oyster, DO  nitroGLYCERIN (NITROSTAT) 0.4 MG SL tablet Place 1 tablet (0.4 mg total) under the tongue every 5 (five) minutes as needed for chest pain. 02/08/21  Yes [name], DO  omeprazole ([place] OTC) 20  MG tablet Take 1 tablet (20 mg total) by mouth daily. 06/20/17  Yes [name], DO  pravastatin (PRAVACHOL) 40 MG tablet Take 1 tablet (40 mg total) by mouth daily. 10/07/20  Yes [name], DO  sertraline (ZOLOFT) 100 MG tablet Take 1 tablet (100 mg total) by mouth daily. 10/07/20  Yes [name], DO  sertraline (ZOLOFT) 50 MG tablet Take 1 tablet (50 mg total) by mouth daily. Take for a total of 150 mg daily. 10/07/20  Yes [name], DO  tamsulosin (FLOMAX) 0.4 MG CAPS capsule Take 1 tab daily in event of a stone. 02/08/21  Yes [name], DO  triamcinolone cream (KENALOG) 0.1 % Apply 1 application topically 3 (three) times daily. 02/08/21  Yes [name], DO    Physical Exam:  Constitutional: older male who appears in no acute distress at this time Vitals:   03/18/21 0718 03/18/21 0800 03/18/21 0815 03/18/21 0900  BP: 110/60 133/76  (!) 145/85  Pulse: 65 66 73 91  Resp: '15 17 18 20  '$ Temp: 97.9 F (36.6 C)   97.9 F (36.6 C)  TempSrc: Oral   Oral  SpO2: 94% 96% 93% 93%  Weight:      Height:       Eyes: PERRL, lids and conjunctivae normal ENMT: Mucous membranes are moist.  Posterior pharynx clear of any exudate or lesions. Neck: normal, supple, no masses, no thyromegaly Respiratory: clear to auscultation bilaterally, no wheezing, no crackles. Normal respiratory effort.  O2 saturation maintained on room air. Cardiovascular: Regular rate and rhythm, no murmurs / rubs / gallops. No extremity edema. 2+ pedal pulses. No carotid bruits.  Abdomen: Protuberant abdomen mild epigastric tenderness appreciated, but after patient had received medicine Musculoskeletal: no clubbing / cyanosis. No joint deformity upper and lower extremities.  Skin: no rashes, lesions, ulcers. No induration Neurologic: CN 2-12 grossly intact. Sensation intact, DTR normal. Strength 5/5 in all 4.  Psychiatric: Normal judgment and insight. Alert and oriented x 3. Normal mood.     Labs on Admission: I have personally reviewed following labs and imaging studies  CBC: Recent Labs  Lab 03/17/21 1731  WBC 14.0*  NEUTROABS 10.9*  HGB 15.6  HCT 46.8  MCV 90.5  PLT 0000000   Basic Metabolic Panel: Recent Labs  Lab 03/17/21 1758  NA 138  K 3.9  CL 107  CO2 21*  GLUCOSE 100*  BUN 15  CREATININE 0.73  CALCIUM 8.1*   GFR: Estimated Creatinine Clearance: 115.2 mL/min (by C-G formula based on SCr of 0.73 mg/dL). Liver Function Tests: Recent Labs  Lab 03/17/21 1758  AST 16  ALT 14  ALKPHOS 94  BILITOT 1.0  PROT 7.3  ALBUMIN 3.5   Recent Labs  Lab 03/17/21 1758  LIPASE 29   No results for input(s): AMMONIA in the last 168 hours. Coagulation Profile: No results for input(s): INR, PROTIME in the last 168 hours. Cardiac Enzymes: No results for input(s): CKTOTAL, CKMB, CKMBINDEX, TROPONINI in the last 168 hours. BNP (last 3 results) No results for input(s): PROBNP in the last 8760 hours. HbA1C: No results for input(s): HGBA1C in the last 72 hours. CBG: No results for input(s): GLUCAP in the last 168 hours. Lipid Profile: No results for input(s): CHOL, HDL, LDLCALC, TRIG,  CHOLHDL, LDLDIRECT in the last 72 hours. Thyroid Function Tests: No results for input(s): TSH, T4TOTAL, FREET4, T3FREE, THYROIDAB in the last 72 hours. Anemia Panel: No results for input(s): VITAMINB12, FOLATE,  FERRITIN, TIBC, IRON, RETICCTPCT in the last 72 hours. Urine analysis:    Component Value Date/Time   COLORURINE YELLOW 03/17/2021 1731   APPEARANCEUR CLEAR 03/17/2021 1731   LABSPEC 1.025 03/17/2021 1731   PHURINE 5.5 03/17/2021 1731   GLUCOSEU NEGATIVE 03/17/2021 1731   GLUCOSEU NEGATIVE 11/02/2015 0811   HGBUR MODERATE (A) 03/17/2021 1731   BILIRUBINUR SMALL (A) 03/17/2021 1731   BILIRUBINUR small (A) 01/22/2020 1052   KETONESUR NEGATIVE 03/17/2021 1731   PROTEINUR NEGATIVE 03/17/2021 1731   UROBILINOGEN 1.0 01/22/2020 1052   UROBILINOGEN 0.2 11/02/2015 0811   NITRITE NEGATIVE 03/17/2021 1731   LEUKOCYTESUR NEGATIVE 03/17/2021 1731   Sepsis Labs: Recent Results (from the past 240 hour(s))  Resp Panel by RT-PCR (Flu A&B, Covid) Nasopharyngeal Swab     Status: None   Collection Time: 03/17/21  8:49 PM   Specimen: Nasopharyngeal Swab; Nasopharyngeal(NP) swabs in vial transport medium  Result Value Ref Range Status   SARS Coronavirus 2 by RT PCR NEGATIVE NEGATIVE Final    Comment: (NOTE) SARS-CoV-2 target nucleic acids are NOT DETECTED.  The SARS-CoV-2 RNA is generally detectable in upper respiratory specimens during the acute phase of infection. The lowest concentration of SARS-CoV-2 viral copies this assay can detect is 138 copies/mL. A negative result does not preclude SARS-Cov-2 infection and should not be used as the sole basis for treatment or other patient management decisions. A negative result may occur with  improper specimen collection/handling, submission of specimen other than nasopharyngeal swab, presence of viral mutation(s) within the areas targeted by this assay, and inadequate number of viral copies(<138 copies/mL). A negative result must be combined  with clinical observations, patient history, and epidemiological information. The expected result is Negative.  Fact Sheet for Patients:  EntrepreneurPulse.com.au  Fact Sheet for Healthcare Providers:  IncredibleEmployment.be  This test is no t yet approved or cleared by the [place] FDA and  has been authorized for detection and/or diagnosis of SARS-CoV-2 by FDA under an Emergency Use Authorization (EUA). This EUA will remain  in effect (meaning this test can be used) for the duration of the COVID-19 declaration under Section 564(b)(1) of the Act, 21 U.S.C.section 360bbb-3(b)(1), unless the authorization is terminated  or revoked sooner.       Influenza A by PCR NEGATIVE NEGATIVE Final   Influenza B by PCR NEGATIVE NEGATIVE Final    Comment: (NOTE) The Xpert Xpress SARS-CoV-2/FLU/RSV plus assay is intended as an aid in the diagnosis of influenza from Nasopharyngeal swab specimens and should not be used as a sole basis for treatment. Nasal washings and aspirates are unacceptable for Xpert Xpress SARS-CoV-2/FLU/RSV testing.  Fact Sheet for Patients: EntrepreneurPulse.com.au  Fact Sheet for Healthcare Providers: IncredibleEmployment.be  This test is not yet approved or cleared by the [place] FDA and has been authorized for detection and/or diagnosis of SARS-CoV-2 by FDA under an Emergency Use Authorization (EUA). This EUA will remain in effect (meaning this test can be used) for the duration of the COVID-19 declaration under Section 564(b)(1) of the Act, 21 U.S.C. section 360bbb-3(b)(1), unless the authorization is terminated or revoked.  Performed at [facility], [address] [place], [place] [address]      Radiological Exams on Admission: CT Angio Chest/Abd/Pel for Dissection W and/or Wo Contrast  Result Date: 03/17/2021 CLINICAL DATA:  65 year old male with history of  abdominal pain for the past 2 days. Evaluate for aortic dissection. EXAM: CT ANGIOGRAPHY CHEST, ABDOMEN AND PELVIS TECHNIQUE: Multidetector CT imaging  through the chest, abdomen and pelvis was performed using the standard protocol during bolus administration of intravenous contrast. Multiplanar reconstructed images and MIPs were obtained and reviewed to evaluate the vascular anatomy. CONTRAST:  170m OMNIPAQUE IOHEXOL 350 MG/ML SOLN COMPARISON:  Chest CT 02/16/2020. CT of the abdomen and pelvis 01/22/2020. FINDINGS: CTA CHEST FINDINGS Cardiovascular: Precontrast images demonstrate no crescentic high attenuation associated with the wall of the thoracic aorta to suggest acute intramural hemorrhage. There is mild thoracic aortic atherosclerosis, without evidence of aneurysm or dissection. Heart size is normal. There is no significant pericardial fluid, thickening or pericardial calcification. No calcified atherosclerotic plaque confidently identified in the coronary arteries. Mediastinum/Nodes: No high attenuation fluid collection in the mediastinum to suggest mediastinal hematoma. No pathologically enlarged mediastinal or hilar lymph nodes. Esophagus is unremarkable in appearance. No axillary lymphadenopathy. Lungs/Pleura: No acute consolidative airspace disease. No pleural effusions. Mild diffuse bronchial wall thickening with mild centrilobular and paraseptal emphysema. Dependent scarring in the lower lobes of the lungs bilaterally. No definite suspicious appearing pulmonary nodules or masses are noted. Musculoskeletal: Status post right shoulder arthroplasty. There are no aggressive appearing lytic or blastic lesions noted in the visualized portions of the skeleton. Review of the MIP images confirms the above findings. CTA ABDOMEN AND PELVIS FINDINGS VASCULAR Aorta: Aortic atherosclerosis. Normal caliber aorta without aneurysm, dissection, vasculitis or significant stenosis. Celiac: Patent without evidence of  aneurysm, dissection, vasculitis or significant stenosis. SMA: Patent without evidence of aneurysm, dissection, vasculitis or significant stenosis. Renals: Both renal arteries are patent without evidence of aneurysm, dissection, vasculitis, fibromuscular dysplasia or significant stenosis. IMA: Patent without evidence of aneurysm, dissection, vasculitis or significant stenosis. Inflow: Patent without evidence of aneurysm, dissection, vasculitis or significant stenosis. Veins: No obvious venous abnormality within the limitations of this arterial phase study. Review of the MIP images confirms the above findings. NON-VASCULAR Hepatobiliary: No suspicious cystic or solid hepatic lesions are noted. No intra or extrahepatic biliary ductal dilatation. Gallbladder is unremarkable in appearance. Pancreas: No pancreatic mass. No pancreatic ductal dilatation. Inflammatory changes are noted centered in the mid to distal body of the pancreas with extensive surrounding fat stranding, but no well organized fluid collections. Subtle hypoenhancement in this distal body of the pancreas may suggest developing pancreatic necrosis. No well-defined peripancreatic fluid collection to clearly indicate pseudocyst formation at this time. Spleen: Unremarkable. Adrenals/Urinary Tract: Bilateral kidneys and adrenal glands are normal in appearance. No hydroureteronephrosis. Urinary bladder is normal in appearance. Stomach/Bowel: The appearance of the stomach is normal. No pathologic dilatation of small bowel or colon. Normal appendix. Lymphatic: Multiple prominent but nonenlarged retroperitoneal and upper abdominal lymph nodes are noted, nonspecific. No definite pathologically enlarged lymph nodes are noted in the abdomen or pelvis. Reproductive: Prostate gland and seminal vesicles are unremarkable in appearance. Other: No significant volume of ascites. No pneumoperitoneum. Tiny umbilical hernia containing only omental fat. Musculoskeletal: There  are no aggressive appearing lytic or blastic lesions noted in the visualized portions of the skeleton. Review of the MIP images confirms the above findings. IMPRESSION: 1. No acute abnormality of the thoracoabdominal aorta. 2. Inflammatory changes centered in the distal body of the pancreas concerning for acute pancreatitis. There is hypoenhancement in this region, concerning for a small area of developing pancreatic necrosis. No definite pseudocyst noted at this time. 3. Tiny umbilical hernia containing only omental fat. No evidence of associated bowel incarceration or obstruction. 4. Aortic atherosclerosis. 5. Mild diffuse bronchial wall thickening with mild centrilobular and paraseptal emphysema; imaging findings suggestive of  underlying COPD. 6. Additional incidental findings, as above. Electronically Signed   By: Vinnie Langton M.D.   On: 03/17/2021 20:06    EKG: Independently reviewed.  Normal sinus rhythm at 63 bpm  Assessment/Plan Pancreatitis with necrosis: Patient presented with complaints of abdominal pain with radiation to the chest, shoulder, and back.  CT imaging significant for inflammatory changes around the distal body of the pancreas concerning for acute pancreatitis with small area of developing necrosis.  The exact cause of patient's symptoms seems to be unclear, but does not appear to be showing signs of being septic.  Voltaren gel was noted perceived to be at increased risk for if systemic absorption occurred. -Admit to a medical telemetry bed -Strict intake and output -Check lipid panel -Lactated Ringer's 125 mL/h -Pain control  Leukocytosis: Acute.  WBC elevated at 14.  Patient without any other significant criteria to give concern for sepsis.  Suspect reactive to above. -Recheck CBC tomorrow  Chest pain: High-sensitivity troponins were negative x2 and EKGs did not show any concern for ischemia.  Suspect likely related with pancreatitis. -Continue to monitor  Nonischemic  cardiomyopathy: Last EF was noted to be 45 to 50%.  Rate of IV fluids was decreased due to this history. -Continue to monitor adjust fluids as needed for signs of fluid overload  Myalgias hypocalcemia: Acute.  Patient complaining of significant muscle cramp down his left leg.  Calcium noted to be 8.1 with albumin within normal limits for which hypocalcemia could likely be a cause.  However, symptoms also could be multifactorial as patient also reported history of fracture of lumbar spine which there could be possible nerve impingement causing pain to radiate down his left leg. -Give 2 g of calcium gluconate IV -Continue to monitor and replace as needed  Essential hypertension: Stable -Continue losartan  Dyslipidemia -Follow-up lipid panel -Continue pravastatin  Anxiety and depression -Continue Zoloft  Tobacco abuse: Patient reports smoking 1 pack cigarettes per day on average.  Counseled the patient on the cessation of tobacco use. -Nicotine patch offered  OSA on CPAP: Patient reports using CPAP regularly and his wife we will bring his home machine. -RT to help set up home CPAP  DVT prophylaxis: Lovenox Code Status: Full Family Communication: Wife updated at bedside Disposition Plan: Home Consults called: None Admission status: Inpatient, possibly require more than 2 midnight stay for need of aggressive IV fluid hydration  Norval Morton MD [facility]   If 7PM-7AM, please contact night-coverage   03/18/2021, 11:25 AM

## 2021-03-18 NOTE — Progress Notes (Signed)
Patient arrived to unit via ambulance a 1030. Patient alert and oriented. Resting in bed. Rates pain acceptable at a 5/10. New IV started in the left forearm.

## 2021-03-19 LAB — BASIC METABOLIC PANEL
Anion gap: 6 (ref 5–15)
BUN: 12 mg/dL (ref 8–23)
CO2: 25 mmol/L (ref 22–32)
Calcium: 8.4 mg/dL — ABNORMAL LOW (ref 8.9–10.3)
Chloride: 104 mmol/L (ref 98–111)
Creatinine, Ser: 0.85 mg/dL (ref 0.61–1.24)
GFR, Estimated: >60 mL/min (ref >60)
Glucose, Bld: 88 mg/dL (ref 70–99)
Potassium: 4 mmol/L (ref 3.5–5.1)
Sodium: 135 mmol/L (ref 135–145)

## 2021-03-19 LAB — CBC WITH DIFFERENTIAL/PLATELET
Abs Immature Granulocytes: 0.04 10*3/uL (ref 0.00–0.07)
Basophils Absolute: 0 10*3/uL (ref 0.0–0.1)
Basophils Relative: 0 %
Eosinophils Absolute: 0.2 10*3/uL (ref 0.0–0.5)
Eosinophils Relative: 2 %
HCT: 42.8 % (ref 39.0–52.0)
Hemoglobin: 14.1 g/dL (ref 13.0–17.0)
Immature Granulocytes: 0 %
Lymphocytes Relative: 18 %
Lymphs Abs: 1.8 10*3/uL (ref 0.7–4.0)
MCH: 29.6 pg (ref 26.0–34.0)
MCHC: 32.9 g/dL (ref 30.0–36.0)
MCV: 89.7 fL (ref 80.0–100.0)
Monocytes Absolute: 0.8 10*3/uL (ref 0.1–1.0)
Monocytes Relative: 8 %
Neutro Abs: 6.7 10*3/uL (ref 1.7–7.7)
Neutrophils Relative %: 72 %
Platelets: 145 10*3/uL — ABNORMAL LOW (ref 150–400)
RBC: 4.77 MIL/uL (ref 4.22–5.81)
RDW: 12.8 % (ref 11.5–15.5)
WBC: 9.5 10*3/uL (ref 4.0–10.5)
nRBC: 0 % (ref 0.0–0.2)

## 2021-03-19 LAB — MAGNESIUM: Magnesium: 1.8 mg/dL (ref 1.7–2.4)

## 2021-03-19 MED ORDER — OXYCODONE-ACETAMINOPHEN 5-325 MG PO TABS: 1.0000 | ORAL_TABLET | ORAL | 0 refills | Status: DC | PRN

## 2021-03-19 NOTE — Progress Notes (Signed)
Reviewed discharge paperwork with patient and spouse. All questions asked and answered. All belongings with patient.  IV discontinued.Taken to lobby in wheelchair.

## 2021-03-19 NOTE — Discharge Summary (Addendum)
Physician Discharge Summary  Charles Grimes [id number] DOB: 04-Jun-1956 DOA: 03/17/2021  PCP: Charles Pal, DO  Admit date: 03/17/2021 Discharge date: 03/19/2021 30 Day Unplanned Readmission Risk Score    Flowsheet Row ED to Hosp-Admission (Current) from 03/17/2021 in [facility]  30 Day Unplanned Readmission Risk Score (%) 11.14 Filed at 03/19/2021 0800       This score is the patient's risk of an unplanned readmission within 30 days of being discharged (0 -100%). The score is based on dignosis, age, lab data, medications, orders, and past utilization.   Low:  0-14.9   Medium: 15-21.9   High: 22-29.9   Extreme: 30 and above          Admitted From: Home Disposition: Home  Recommendations for Outpatient Follow-up:  Follow up with PCP in 1-2 weeks Please obtain BMP/CBC in one week Please follow up with your PCP on the following pending results: Unresulted Labs (From admission, onward)    None         Home Health: None Equipment/Devices: None  Discharge Condition: Stable CODE STATUS: Full code Diet recommendation: Soft diet  Subjective: Seen and examined.  Wife at the bedside.  Feels better.  No abdominal pain or nausea.  Tolerated regular diet.  Desires to go home today.  Brief/Interim Summary: 65 year old gentleman with a history of hypertension, hyperlipidemia, CAD, and ICM, arthritis and obesity who presented to the ED with a complaint of 4 to 5 days of abdominal pain.  Upon arrival to ED, his blood pressure was elevated at 198/83 however he was otherwise hemodynamically stable.  White cells were 14 calcium was 8.1.  Lipase was 29.  CT angiogram of the chest, abdomen and pelvis was concerning for pancreatitis with small area of developing necrosis but no abscess.  He was admitted with acute pancreatitis.  Does not drink alcohol, no gallstones on the CT abdomen.  Patient was started on IV fluids however he was also started on  regular food.  When seen this morning, he has no more abdominal pain and he has tolerated regular food.  No abdominal tenderness on exam.  Hemodynamically stable.  Desires to go home.  He is a stable and already tolerating regular diet so he is being discharged home.  When reviewed his home medications, he seems to be on 3 different medications which are either class I or class II medications which can cause pancreatitis and they are omeprazole, losartan and pravastatin.  I have stopped all 3 medications for now and have advised him to see his PCP within a week and PCP can decide if any of those need to be resumed or can be transition to a different sort of medications such as Crestor instead of pravastatin since Crestor is class V medication which can cause pancreatitis.  H2 blockers instead of PPI etc. his leukocytosis resolved and was likely reactive due to acute pancreatitis.  Discharge Diagnoses:  Principal Problem:   Pancreatitis Active Problems:   Obesity (BMI 30-39.9)   Mild essential hypertension   Dyslipidemia, goal LDL below 100   Anxiety and depression   OSA (obstructive sleep apnea)   Tobacco abuse   Leukocytosis   Hypocalcemia    Discharge Instructions   Allergies as of 03/19/2021       Reactions   Isosorbide Nitrate Other (See Comments)   CONTINUOUS HEADACHE    Oxycodone Itching   Reaction was to straight oxycodone 15 mg tablets - no reaction to  percocet   Penicillins Hives, Rash   Has patient had a PCN reaction causing immediate rash, facial/tongue/throat swelling, SOB or lightheadedness with hypotension: Yes Has patient had a PCN reaction causing severe rash involving mucus membranes or skin necrosis: No Has patient had a PCN reaction that required hospitalization No Has patient had a PCN reaction occurring within the last 10 years: No If all of the above answers are "NO", then may proceed with Cephalosporin use.        Medication List     STOP taking these  medications    losartan 50 MG tablet Commonly known as: COZAAR   omeprazole 20 MG tablet Commonly known as: PRILOSEC OTC   pravastatin 40 MG tablet Commonly known as: PRAVACHOL       TAKE these medications    acetaminophen 500 MG tablet Commonly known as: TYLENOL Take 1,000 mg by mouth every 6 (six) hours as needed for mild pain, fever or headache.   aspirin EC 81 MG tablet Take 81 mg by mouth daily.   diclofenac Sodium 1 % Gel Commonly known as: Voltaren Apply 2 g topically 4 (four) times daily. What changed:  when to take this reasons to take this   nitroGLYCERIN 0.4 MG SL tablet Commonly known as: NITROSTAT Place 1 tablet (0.4 mg total) under the tongue every 5 (five) minutes as needed for chest pain.   oxyCODONE-acetaminophen 5-325 MG tablet Commonly known as: Percocet Take 1 tablet by mouth every 4 (four) hours as needed for severe pain.   sertraline 50 MG tablet Commonly known as: ZOLOFT Take 1 tablet (50 mg total) by mouth daily. Take for a total of 150 mg daily. What changed:  when to take this additional instructions   sertraline 100 MG tablet Commonly known as: ZOLOFT Take 1 tablet (100 mg total) by mouth daily. What changed: when to take this   tamsulosin 0.4 MG Caps capsule Commonly known as: FLOMAX Take 1 tab daily in event of a stone.   triamcinolone cream 0.1 % Commonly known as: KENALOG Apply 1 application topically 3 (three) times daily. What changed:  when to take this reasons to take this        [place], [place], DO Follow up in 1 week(s).   Specialty: Family Medicine Contact information: [address] [place] [address] [phone]                Allergies  Allergen Reactions   Isosorbide Nitrate Other (See Comments)    CONTINUOUS HEADACHE    Oxycodone Itching    Reaction was to straight oxycodone 15 mg tablets - no reaction to percocet   Penicillins Hives  and Rash    Has patient had a PCN reaction causing immediate rash, facial/tongue/throat swelling, SOB or lightheadedness with hypotension: Yes Has patient had a PCN reaction causing severe rash involving mucus membranes or skin necrosis: No Has patient had a PCN reaction that required hospitalization No Has patient had a PCN reaction occurring within the last 10 years: No If all of the above answers are "NO", then may proceed with Cephalosporin use.    Consultations: None   Procedures/Studies: CT Angio Chest/Abd/Pel for Dissection W and/or Wo Contrast  Result Date: 03/17/2021 CLINICAL DATA:  65 year old male with history of abdominal pain for the past 2 days. Evaluate for aortic dissection. EXAM: CT ANGIOGRAPHY CHEST, ABDOMEN AND PELVIS TECHNIQUE: Multidetector CT imaging through the chest, abdomen and pelvis  was performed using the standard protocol during bolus administration of intravenous contrast. Multiplanar reconstructed images and MIPs were obtained and reviewed to evaluate the vascular anatomy. CONTRAST:  180m OMNIPAQUE IOHEXOL 350 MG/ML SOLN COMPARISON:  Chest CT 02/16/2020. CT of the abdomen and pelvis 01/22/2020. FINDINGS: CTA CHEST FINDINGS Cardiovascular: Precontrast images demonstrate no crescentic high attenuation associated with the wall of the thoracic aorta to suggest acute intramural hemorrhage. There is mild thoracic aortic atherosclerosis, without evidence of aneurysm or dissection. Heart size is normal. There is no significant pericardial fluid, thickening or pericardial calcification. No calcified atherosclerotic plaque confidently identified in the coronary arteries. Mediastinum/Nodes: No high attenuation fluid collection in the mediastinum to suggest mediastinal hematoma. No pathologically enlarged mediastinal or hilar lymph nodes. Esophagus is unremarkable in appearance. No axillary lymphadenopathy. Lungs/Pleura: No acute consolidative airspace disease. No pleural effusions.  Mild diffuse bronchial wall thickening with mild centrilobular and paraseptal emphysema. Dependent scarring in the lower lobes of the lungs bilaterally. No definite suspicious appearing pulmonary nodules or masses are noted. Musculoskeletal: Status post right shoulder arthroplasty. There are no aggressive appearing lytic or blastic lesions noted in the visualized portions of the skeleton. Review of the MIP images confirms the above findings. CTA ABDOMEN AND PELVIS FINDINGS VASCULAR Aorta: Aortic atherosclerosis. Normal caliber aorta without aneurysm, dissection, vasculitis or significant stenosis. Celiac: Patent without evidence of aneurysm, dissection, vasculitis or significant stenosis. SMA: Patent without evidence of aneurysm, dissection, vasculitis or significant stenosis. Renals: Both renal arteries are patent without evidence of aneurysm, dissection, vasculitis, fibromuscular dysplasia or significant stenosis. IMA: Patent without evidence of aneurysm, dissection, vasculitis or significant stenosis. Inflow: Patent without evidence of aneurysm, dissection, vasculitis or significant stenosis. Veins: No obvious venous abnormality within the limitations of this arterial phase study. Review of the MIP images confirms the above findings. NON-VASCULAR Hepatobiliary: No suspicious cystic or solid hepatic lesions are noted. No intra or extrahepatic biliary ductal dilatation. Gallbladder is unremarkable in appearance. Pancreas: No pancreatic mass. No pancreatic ductal dilatation. Inflammatory changes are noted centered in the mid to distal body of the pancreas with extensive surrounding fat stranding, but no well organized fluid collections. Subtle hypoenhancement in this distal body of the pancreas may suggest developing pancreatic necrosis. No well-defined peripancreatic fluid collection to clearly indicate pseudocyst formation at this time. Spleen: Unremarkable. Adrenals/Urinary Tract: Bilateral kidneys and adrenal  glands are normal in appearance. No hydroureteronephrosis. Urinary bladder is normal in appearance. Stomach/Bowel: The appearance of the stomach is normal. No pathologic dilatation of small bowel or colon. Normal appendix. Lymphatic: Multiple prominent but nonenlarged retroperitoneal and upper abdominal lymph nodes are noted, nonspecific. No definite pathologically enlarged lymph nodes are noted in the abdomen or pelvis. Reproductive: Prostate gland and seminal vesicles are unremarkable in appearance. Other: No significant volume of ascites. No pneumoperitoneum. Tiny umbilical hernia containing only omental fat. Musculoskeletal: There are no aggressive appearing lytic or blastic lesions noted in the visualized portions of the skeleton. Review of the MIP images confirms the above findings. IMPRESSION: 1. No acute abnormality of the thoracoabdominal aorta. 2. Inflammatory changes centered in the distal body of the pancreas concerning for acute pancreatitis. There is hypoenhancement in this region, concerning for a small area of developing pancreatic necrosis. No definite pseudocyst noted at this time. 3. Tiny umbilical hernia containing only omental fat. No evidence of associated bowel incarceration or obstruction. 4. Aortic atherosclerosis. 5. Mild diffuse bronchial wall thickening with mild centrilobular and paraseptal emphysema; imaging findings suggestive of underlying COPD. 6. Additional incidental findings,  as above. Electronically Signed   By: Vinnie Langton M.D.   On: 03/17/2021 20:06     Discharge Exam: Vitals:   03/18/21 1125 03/19/21 0802  BP: 130/73 (!) 160/96  Pulse: 65 75  Resp: 18 18  Temp: 99.2 F (37.3 C) 98.7 F (37.1 C)  SpO2: 90% 97%   Vitals:   03/18/21 0815 03/18/21 0900 03/18/21 1125 03/19/21 0802  BP:  (!) 145/85 130/73 (!) 160/96  Pulse: 73 91 65 75  Resp: '18 20 18 18  '$ Temp:  97.9 F (36.6 C) 99.2 F (37.3 C) 98.7 F (37.1 C)  TempSrc:  Oral Oral Oral  SpO2: 93% 93%  90% 97%  Weight:      Height:        General: Pt is alert, awake, not in acute distress Cardiovascular: RRR, S1/S2 +, no rubs, no gallops Respiratory: CTA bilaterally, no wheezing, no rhonchi Abdominal: Soft, NT, ND, bowel sounds + Extremities: no edema, no cyanosis    The results of significant diagnostics from this hospitalization (including imaging, microbiology, ancillary and laboratory) are listed below for reference.     Microbiology: Recent Results (from the past 240 hour(s))  Resp Panel by RT-PCR (Flu A&B, Covid) Nasopharyngeal Swab     Status: None   Collection Time: 03/17/21  8:49 PM   Specimen: Nasopharyngeal Swab; Nasopharyngeal(NP) swabs in vial transport medium  Result Value Ref Range Status   SARS Coronavirus 2 by RT PCR NEGATIVE NEGATIVE Final    Comment: (NOTE) SARS-CoV-2 target nucleic acids are NOT DETECTED.  The SARS-CoV-2 RNA is generally detectable in upper respiratory specimens during the acute phase of infection. The lowest concentration of SARS-CoV-2 viral copies this assay can detect is 138 copies/mL. A negative result does not preclude SARS-Cov-2 infection and should not be used as the sole basis for treatment or other patient management decisions. A negative result may occur with  improper specimen collection/handling, submission of specimen other than nasopharyngeal swab, presence of viral mutation(s) within the areas targeted by this assay, and inadequate number of viral copies(<138 copies/mL). A negative result must be combined with clinical observations, patient history, and epidemiological information. The expected result is Negative.  Fact Sheet for Patients:  EntrepreneurPulse.com.au  Fact Sheet for Healthcare Providers:  IncredibleEmployment.be  This test is no t yet approved or cleared by the [place] FDA and  has been authorized for detection and/or diagnosis of SARS-CoV-2 by FDA under an  Emergency Use Authorization (EUA). This EUA will remain  in effect (meaning this test can be used) for the duration of the COVID-19 declaration under Section 564(b)(1) of the Act, 21 U.S.C.section 360bbb-3(b)(1), unless the authorization is terminated  or revoked sooner.       Influenza A by PCR NEGATIVE NEGATIVE Final   Influenza B by PCR NEGATIVE NEGATIVE Final    Comment: (NOTE) The Xpert Xpress SARS-CoV-2/FLU/RSV plus assay is intended as an aid in the diagnosis of influenza from Nasopharyngeal swab specimens and should not be used as a sole basis for treatment. Nasal washings and aspirates are unacceptable for Xpert Xpress SARS-CoV-2/FLU/RSV testing.  Fact Sheet for Patients: EntrepreneurPulse.com.au  Fact Sheet for Healthcare Providers: IncredibleEmployment.be  This test is not yet approved or cleared by the [place] FDA and has been authorized for detection and/or diagnosis of SARS-CoV-2 by FDA under an Emergency Use Authorization (EUA). This EUA will remain in effect (meaning this test can be used) for the duration of the COVID-19 declaration under Section 564(b)(1) of  the Act, 21 U.S.C. section 360bbb-3(b)(1), unless the authorization is terminated or revoked.  Performed at [facility], [address] [place], [place] [address]      Labs: BNP (last 3 results) No results for input(s): BNP in the last 8760 hours. Basic Metabolic Panel: Recent Labs  Lab 03/17/21 1758 03/19/21 0203 03/19/21 0845  NA 138 135  --   K 3.9 4.0  --   CL 107 104  --   CO2 21* 25  --   GLUCOSE 100* 88  --   BUN 15 12  --   CREATININE 0.73 0.85  --   CALCIUM 8.1* 8.4*  --   MG  --   --  1.8   Liver Function Tests: Recent Labs  Lab 03/17/21 1758  AST 16  ALT 14  ALKPHOS 94  BILITOT 1.0  PROT 7.3  ALBUMIN 3.5   Recent Labs  Lab 03/17/21 1758  LIPASE 29   No results for input(s): AMMONIA in the last 168  hours. CBC: Recent Labs  Lab 03/17/21 1731 03/18/21 1348 03/19/21 0845  WBC 14.0* 12.3* 9.5  NEUTROABS 10.9* 9.2* 6.7  HGB 15.6 13.9 14.1  HCT 46.8 43.3 42.8  MCV 90.5 91.5 89.7  PLT 152 131* 145*   Cardiac Enzymes: No results for input(s): CKTOTAL, CKMB, CKMBINDEX, TROPONINI in the last 168 hours. BNP: Invalid input(s): POCBNP CBG: No results for input(s): GLUCAP in the last 168 hours. D-Dimer No results for input(s): DDIMER in the last 72 hours. Hgb A1c No results for input(s): HGBA1C in the last 72 hours. Lipid Profile Recent Labs    03/18/21 1348  CHOL 169  HDL 36*  LDLCALC 116*  TRIG 85  CHOLHDL 4.7   Thyroid function studies No results for input(s): TSH, T4TOTAL, T3FREE, THYROIDAB in the last 72 hours.  Invalid input(s): FREET3 Anemia work up No results for input(s): VITAMINB12, FOLATE, FERRITIN, TIBC, IRON, RETICCTPCT in the last 72 hours. Urinalysis    Component Value Date/Time   COLORURINE YELLOW 03/17/2021 1731   APPEARANCEUR CLEAR 03/17/2021 1731   LABSPEC 1.025 03/17/2021 1731   PHURINE 5.5 03/17/2021 1731   GLUCOSEU NEGATIVE 03/17/2021 1731   GLUCOSEU NEGATIVE 11/02/2015 0811   HGBUR MODERATE (A) 03/17/2021 1731   BILIRUBINUR SMALL (A) 03/17/2021 1731   BILIRUBINUR small (A) 01/22/2020 1052   KETONESUR NEGATIVE 03/17/2021 1731   PROTEINUR NEGATIVE 03/17/2021 1731   UROBILINOGEN 1.0 01/22/2020 1052   UROBILINOGEN 0.2 11/02/2015 0811   NITRITE NEGATIVE 03/17/2021 1731   LEUKOCYTESUR NEGATIVE 03/17/2021 1731   Sepsis Labs Invalid input(s): PROCALCITONIN,  WBC,  LACTICIDVEN Microbiology Recent Results (from the past 240 hour(s))  Resp Panel by RT-PCR (Flu A&B, Covid) Nasopharyngeal Swab     Status: None   Collection Time: 03/17/21  8:49 PM   Specimen: Nasopharyngeal Swab; Nasopharyngeal(NP) swabs in vial transport medium  Result Value Ref Range Status   SARS Coronavirus 2 by RT PCR NEGATIVE NEGATIVE Final    Comment: (NOTE) SARS-CoV-2  target nucleic acids are NOT DETECTED.  The SARS-CoV-2 RNA is generally detectable in upper respiratory specimens during the acute phase of infection. The lowest concentration of SARS-CoV-2 viral copies this assay can detect is 138 copies/mL. A negative result does not preclude SARS-Cov-2 infection and should not be used as the sole basis for treatment or other patient management decisions. A negative result may occur with  improper specimen collection/handling, submission of specimen other than nasopharyngeal swab, presence of viral mutation(s) within  the areas targeted by this assay, and inadequate number of viral copies(<138 copies/mL). A negative result must be combined with clinical observations, patient history, and epidemiological information. The expected result is Negative.  Fact Sheet for Patients:  EntrepreneurPulse.com.au  Fact Sheet for Healthcare Providers:  IncredibleEmployment.be  This test is no t yet approved or cleared by the [place] FDA and  has been authorized for detection and/or diagnosis of SARS-CoV-2 by FDA under an Emergency Use Authorization (EUA). This EUA will remain  in effect (meaning this test can be used) for the duration of the COVID-19 declaration under Section 564(b)(1) of the Act, 21 U.S.C.section 360bbb-3(b)(1), unless the authorization is terminated  or revoked sooner.       Influenza A by PCR NEGATIVE NEGATIVE Final   Influenza B by PCR NEGATIVE NEGATIVE Final    Comment: (NOTE) The Xpert Xpress SARS-CoV-2/FLU/RSV plus assay is intended as an aid in the diagnosis of influenza from Nasopharyngeal swab specimens and should not be used as a sole basis for treatment. Nasal washings and aspirates are unacceptable for Xpert Xpress SARS-CoV-2/FLU/RSV testing.  Fact Sheet for Patients: EntrepreneurPulse.com.au  Fact Sheet for Healthcare  Providers: IncredibleEmployment.be  This test is not yet approved or cleared by the [place] FDA and has been authorized for detection and/or diagnosis of SARS-CoV-2 by FDA under an Emergency Use Authorization (EUA). This EUA will remain in effect (meaning this test can be used) for the duration of the COVID-19 declaration under Section 564(b)(1) of the Act, 21 U.S.C. section 360bbb-3(b)(1), unless the authorization is terminated or revoked.  Performed at [facility], [address] [place], [place] [address]      Time coordinating discharge: Over 30 minutes  SIGNED:   Darliss Cheney, MD  [facility] 03/19/2021, 10:23 AM  If 7PM-7AM, please contact night-coverage [email or web address]

## 2021-03-19 NOTE — Plan of Care (Signed)

## 2021-03-22 ENCOUNTER — Telehealth: Payer: Self-pay

## 2021-03-22 NOTE — Telephone Encounter (Signed)
Transition Care Management Follow-up Telephone Call Date of discharge and from where: 03/28/2021-[place] How have you been since you were released from the hospital? Per wife doing well. Any questions or concerns? No  Items Reviewed: Did the pt receive and understand the discharge instructions provided? Yes  Medications obtained and verified? Yes  Other? Yes  Any new allergies since your discharge? No  Dietary orders reviewed? Yes Do you have support at home? Yes   Home Care and Equipment/Supplies: Were home health services ordered? no If so, what is the name of the agency? N/a  Has the agency set up a time to come to the patient's home? not applicable Were any new equipment or medical supplies ordered?  No What is the name of the medical supply agency? N/a Were you able to get the supplies/equipment? not applicable Do you have any questions related to the use of the equipment or supplies? N/a  Functional Questionnaire: (I = Independent and D = Dependent) ADLs: I  Bathing/Dressing- I  Meal Prep- I  Eating- I  Maintaining continence- I  Transferring/Ambulation- I  Managing Meds- I  Follow up appointments reviewed:  PCP Hospital f/u appt confirmed? Yes  Scheduled to see Dr. Nani Ravens on 03/28/2021 @ 3:45. [facility] f/u appt confirmed?  N/a   Are transportation arrangements needed? No  If their condition worsens, is the pt aware to call PCP or go to the Emergency Dept.? Yes Was the patient provided with contact information for the PCP's office or ED? Yes Was to pt encouraged to call back with questions or concerns? Yes

## 2021-03-28 ENCOUNTER — Other Ambulatory Visit: Payer: Self-pay

## 2021-03-28 ENCOUNTER — Ambulatory Visit (HOSPITAL_BASED_OUTPATIENT_CLINIC_OR_DEPARTMENT_OTHER)
Admission: RE | Admit: 2021-03-28 | Discharge: 2021-03-28 | Disposition: A | Discharge status: Home / Self Care | Payer: 59 | Source: Ambulatory Visit | Attending: Family Medicine | Admitting: Family Medicine

## 2021-03-28 ENCOUNTER — Ambulatory Visit (INDEPENDENT_AMBULATORY_CARE_PROVIDER_SITE_OTHER): Payer: 59 | Admitting: Family Medicine

## 2021-03-28 ENCOUNTER — Encounter: Payer: Self-pay | Admitting: Family Medicine

## 2021-03-28 VITALS — BP 122/82 | HR 74 | Temp 98.3°F | Ht 71.0 in | Wt 225.4 lb

## 2021-03-28 DIAGNOSIS — M25552 Pain in left hip: Secondary | ICD-10-CM | POA: Diagnosis present

## 2021-03-28 DIAGNOSIS — K859 Acute pancreatitis without necrosis or infection, unspecified: Secondary | ICD-10-CM

## 2021-03-28 DIAGNOSIS — E785 Hyperlipidemia, unspecified: Secondary | ICD-10-CM

## 2021-03-28 MED ORDER — LISINOPRIL 10 MG PO TABS: 10.0000 mg | ORAL_TABLET | Freq: Every day | ORAL | 3 refills | Status: DC

## 2021-03-28 MED ORDER — ROSUVASTATIN CALCIUM 20 MG PO TABS: 20.0000 mg | ORAL_TABLET | Freq: Every day | ORAL | 3 refills | Status: DC

## 2021-03-28 NOTE — Patient Instructions (Signed)
Give [place] 2-3 business days to get the results of your labs back.   We will be in touch with the X-ray results.   Stay hydrated.  Cancel appt if you are doing well.   Pepcid/famotidine 20 mg twice daily to replace your reflux medication.   The only lifestyle changes that have data behind them are weight loss for the overweight/obese and elevating the head of the bed. Finding out which foods/positions are triggers is important.  Let us know if you need anything.

## 2021-03-28 NOTE — Progress Notes (Signed)
Chief Complaint  Patient presents with   Hospitalization Follow-up    HPI Charles Grimes is a 65 y.o. y.o. male who presents for a transition of care visit.  Pt was discharged from [facility] on 03/19/21.  Within 48 business hours of discharge our office contacted pt via telephone to coordinate care and needs.   The patient had severe epigastric abdominal pain for 2 days prior to presenting to the emergency department.  CT abdomen/pelvis showed inflammation around the pancreas.  He was admitted for supportive care while he improved.  He was able to tolerate a small amount of oral intake on the day of discharge.  He continues to have epigastric abdominal pain and poor appetite.  This is steadily improving.  He is hungry but appetite still not back to normal. No nausea or vomiting.  He denies any fevers.  No alcohol use and no evidence of stones on imaging though no ultrasound was performed.  His losartan, Prilosec, and pravastatin were stopped.  He has been using Mylanta instead.  He was on losartan for prevention of adverse cardiac remodeling.  He continues to have left hip pain.  It got worse in the hospital.  He was given gluteus medius stretches and exercises which did not help. No bruising, redness, or swelling.   Past Medical History:  Diagnosis Date   Anginal pain ([place])    Anxiety    Arthritis    Coronary artery disease, non-occlusive 08/2013   Mild to moderate (40% OM1) single vessel CAD. Otherwise nonobstructed.   Depression    Dyslipidemia, goal LDL below 100    Essential hypertension    Family history of premature CAD    GERD (gastroesophageal reflux disease)    H/O skin disorder    Involving hands. Subsequently resolved.    Nonischemic cardiomyopathy ([place]) 05/2013   Non-ischemic: EF ~45% by Echo & Myoview --> Non-obstructive CAD   Obesity (BMI 30.0-34.9)    OSA (obstructive sleep apnea) 06/21/2017   Past Surgical History:  Procedure Laterality Date   LEFT HEART CATHETERIZATION WITH  CORONARY ANGIOGRAM  08/26/2013   Mild to moderate disease with 40-50% stenosis and OM1. Otherwise no significant CAD --  Surgeon: Leonie Man, MD;  Location: [facility] CATH LAB;  Service: Cardiovascular;;   Lower extremity arterial Dopplers  06/11/2013   No occlusive disease   LUMBAR LAMINECTOMY/DECOMPRESSION MICRODISCECTOMY Right 10/07/2014   Procedure: LUMBAR LAMINECTOMY/DECOMPRESSION MICRODISCECTOMY 1 LEVEL;  Surgeon: Kary Kos, MD;  Location: [facility] NEURO ORS;  Service: Neurosurgery;  Laterality: Right;  Right L3-L4 Microdiscectomy   NM MYOVIEW LTD  06/03/2013   Low risk study with no ischemia. EF roughly 44% with no regional wall motion abnormalities noted   PFTs  06/2013   Normal Volumes &  Spirometry; Moderately reduced DLCO   SHOULDER HEMI-ARTHROPLASTY Right 05/28/2015   Procedure: RIGHT SHOULDER HEMI-ARTHROPLASTY CTA HEAD AND SUBSCAP REPAIR ;  Surgeon: Netta Cedars, MD;  Location: [place];  Service: Orthopedics;  Laterality: Right;   TRANSTHORACIC ECHOCARDIOGRAM  06/11/2013   Mildly reduced EF: 45-50%. Mild anterior hypokinesis with incoordinate septal motion. No evidence of pulmonary hypertension   Family History  Problem Relation Age of Onset   Atrial fibrillation Mother        Alive at 48   COPD Mother    Hypertension Mother    Colonic polyp Mother    Hypertension Father        Alive at 34   Lung cancer Father  Chronic smoker   Factor V Leiden deficiency Father        Also Lupus anticoagulant   Heart attack Maternal Grandmother 9   Heart failure Paternal Grandmother    Diabetes Paternal Grandmother    Heart attack Brother 71       X2   Heart attack Sister 89       Deceased   Healthy Sister        x2   Healthy Son        x2   Colon cancer Neg Hx    Esophageal cancer Neg Hx    Rectal cancer Neg Hx    Stomach cancer Neg Hx    Allergies as of 03/28/2021       Reactions   Isosorbide Nitrate Other (See Comments)   CONTINUOUS HEADACHE    Oxycodone Itching   Reaction  was to straight oxycodone 15 mg tablets - no reaction to percocet   Penicillins Hives, Rash   Has patient had a PCN reaction causing immediate rash, facial/tongue/throat swelling, SOB or lightheadedness with hypotension: Yes Has patient had a PCN reaction causing severe rash involving mucus membranes or skin necrosis: No Has patient had a PCN reaction that required hospitalization No Has patient had a PCN reaction occurring within the last 10 years: No If all of the above answers are "NO", then may proceed with Cephalosporin use.        Medication List        Accurate as of March 28, 2021  4:54 PM. If you have any questions, ask your nurse or doctor.          STOP taking these medications    tamsulosin 0.4 MG Caps capsule Commonly known as: FLOMAX Stopped by: [name], DO       TAKE these medications    acetaminophen 500 MG tablet Commonly known as: TYLENOL Take 1,000 mg by mouth every 6 (six) hours as needed for mild pain, fever or headache.   aspirin EC 81 MG tablet Take 81 mg by mouth daily.   diclofenac Sodium 1 % Gel Commonly known as: Voltaren Apply 2 g topically 4 (four) times daily. What changed:  when to take this reasons to take this   lisinopril 10 MG tablet Commonly known as: ZESTRIL Take 1 tablet (10 mg total) by mouth daily. Started by: [name], DO   nitroGLYCERIN 0.4 MG SL tablet Commonly known as: NITROSTAT Place 1 tablet (0.4 mg total) under the tongue every 5 (five) minutes as needed for chest pain.   oxyCODONE-acetaminophen 5-325 MG tablet Commonly known as: Percocet Take 1 tablet by mouth every 4 (four) hours as needed for severe pain.   rosuvastatin 20 MG tablet Commonly known as: Crestor Take 1 tablet (20 mg total) by mouth daily. Started by: [name], DO   sertraline 50 MG tablet Commonly known as: ZOLOFT Take 1 tablet (50 mg total) by mouth daily. Take for a total of 150 mg  daily. What changed:  when to take this additional instructions   sertraline 100 MG tablet Commonly known as: ZOLOFT Take 1 tablet (100 mg total) by mouth daily. What changed: when to take this   triamcinolone cream 0.1 % Commonly known as: KENALOG Apply 1 application topically 3 (three) times daily. What changed:  when to take this reasons to take this        ROS:  Constitutional: No fevers or chills, no weight loss HEENT: No headaches, hearing  loss, or runny nose, no sore throat Heart: No chest pain Lungs: No SOB, no cough Abd: +abd pain GU: No urinary complaints Neuro: No numbness, tingling or weakness Msk: +L hip pain  Objective BP 122/82   Pulse 74   Temp 98.3 F (36.8 C) (Oral)   Ht '5\' 11"'$  (1.803 m)   Wt 225 lb 6 oz (102.2 kg)   SpO2 95%   BMI 31.43 kg/m  General Appearance:  awake, alert, oriented, in no acute distress and well developed, well nourished Skin:  there are no suspicious lesions or rashes of concern Head/face:  NCAT Eyes:  EOMI, PERRLA Ears:  canals and TMs NI Nose/Sinuses:  negative Mouth/Throat:  Mucosa moist, no lesions; pharynx without erythema, edema or exudate. Neck:  neck- supple, no mass, non-tender and no jvd Lungs: Clear to auscultation.  No rales, rhonchi, or wheezing. Normal effort, no accessory muscle use. Heart:  Heart sounds are normal.  Regular rate and rhythm without murmur, gallop or rub. No bruits. Abdomen:  BS+, soft, TTP in epigastric region, ND, no masses or organomegaly Musculoskeletal:  No muscle group atrophy or asymmetry, TTP in greater troch, +pain w resisted L hip abduction Neurologic:  Alert and oriented x 3, gait antalgic, reflexes normal and symmetric, strength and  sensation grossly normal Psych exam: Nml mood and affect, age appropriate judgment and insight  Acute pancreatitis without infection or necrosis, unspecified pancreatitis type - Plan: CBC, Comprehensive metabolic panel  Dyslipidemia, goal LDL  below 100  Left hip pain - Plan: DG HIP UNILAT WITH PELVIS 2-3 VIEWS LEFT  Discharge summary and medication list have been reviewed/reconciled.  Labs pending at the time of discharge have been reviewed or are still pending at the time of this visit.  Follow-up labs and appointments have been ordered and/or coordinated appropriately. Will have him replace PPI w Pepcid, losartan w lisinopril, pravastatin with Crestor.  Image L hip w plain films to r/o OA. If neg, will consider injection of bursa at f/u vs PT.  F/u in 1 week. If no better, will consider reimaging. Advance diet as tolerated.   TRANSITIONAL CARE MANAGEMENT CERTIFICATION:  I certify the following are true:   1. Communication with the patient/care giver was made within 2 business days of discharge.  2. Complexity of Medical decision making is moderate.  3. Face to face visit occurred within 14 days of discharge.   The patient and his spouse voiced understanding and agreement to the plan.  [place], DO 03/28/21 4:54 PM

## 2021-03-29 LAB — CBC
HCT: 43.3 % (ref 39.0–52.0)
Hemoglobin: 14.4 g/dL (ref 13.0–17.0)
MCHC: 33.3 g/dL (ref 30.0–36.0)
MCV: 89.5 fl (ref 78.0–100.0)
Platelets: 219 10*3/uL (ref 150.0–400.0)
RBC: 4.84 Mil/uL (ref 4.22–5.81)
RDW: 13.6 % (ref 11.5–15.5)
WBC: 8.7 10*3/uL (ref 4.0–10.5)

## 2021-03-29 LAB — COMPREHENSIVE METABOLIC PANEL
ALT: 18 U/L (ref 0–53)
AST: 19 U/L (ref 0–37)
Albumin: 3.8 g/dL (ref 3.5–5.2)
Alkaline Phosphatase: 91 U/L (ref 39–117)
BUN: 17 mg/dL (ref 6–23)
CO2: 25 mEq/L (ref 19–32)
Calcium: 9 mg/dL (ref 8.4–10.5)
Chloride: 105 mEq/L (ref 96–112)
Creatinine, Ser: 1.18 mg/dL (ref 0.40–1.50)
GFR: 65.13 mL/min (ref >60.00)
Glucose, Bld: 87 mg/dL (ref 70–99)
Potassium: 4 mEq/L (ref 3.5–5.1)
Sodium: 139 mEq/L (ref 135–145)
Total Bilirubin: 0.5 mg/dL (ref 0.2–1.2)
Total Protein: 7.5 g/dL (ref 6.0–8.3)

## 2021-03-30 ENCOUNTER — Telehealth: Payer: Self-pay | Admitting: Family Medicine

## 2021-03-30 NOTE — Telephone Encounter (Signed)
Pt spouse called in she recently received results back from her husband x-ray. She stated her husband was given the option to have physical therapy or hip injections. He would like to have injections done and wants to know if it would be okay to have them done on his next f/u appt on 9/21? Please advise.

## 2021-03-30 NOTE — Telephone Encounter (Signed)
Called his wife to inform ok to do hip injections at the next appt.

## 2021-04-06 ENCOUNTER — Ambulatory Visit (INDEPENDENT_AMBULATORY_CARE_PROVIDER_SITE_OTHER): Payer: 59 | Admitting: Family Medicine

## 2021-04-06 ENCOUNTER — Encounter: Payer: Self-pay | Admitting: Family Medicine

## 2021-04-06 ENCOUNTER — Other Ambulatory Visit: Payer: Self-pay

## 2021-04-06 VITALS — BP 122/80 | HR 58 | Temp 97.9°F | Ht 71.0 in | Wt 223.5 lb

## 2021-04-06 DIAGNOSIS — K859 Acute pancreatitis without necrosis or infection, unspecified: Secondary | ICD-10-CM | POA: Diagnosis not present

## 2021-04-06 DIAGNOSIS — M25552 Pain in left hip: Secondary | ICD-10-CM | POA: Diagnosis not present

## 2021-04-06 MED ORDER — METHYLPREDNISOLONE ACETATE 40 MG/ML IJ SUSP
40.0000 mg | Freq: Once | INTRAMUSCULAR | Status: AC
Start: 2021-04-06 — End: 2021-04-06
  Administered 2021-04-06: 40 mg via INTRA_ARTICULAR

## 2021-04-06 NOTE — Progress Notes (Signed)
depo 

## 2021-04-06 NOTE — Progress Notes (Signed)
Chief Complaint  Charles Grimes presents with   Follow-up    Left hip pain    Subjective: Charles Grimes is a 65 y.o. male here for f/u. Here w spouse.   Charles Grimes following up after 1 week from being discharged from the hospital for pancreatitis.  From last week when I saw him in the office, he reports around 70-80% improvement.  He still has a poor appetite.  He is not nauseous or vomiting.  He continues to have left hip pain.  He had an x-ray that showed mild arthritis but no indication for surgery.  The pain is mainly on the outside of his left hip.  He has been limping because of it.  He has associated lower back pain.  He has never had an injection.  He failed home stretches and exercises.  Past Medical History:  Diagnosis Date   Anginal pain ([place])    Anxiety    Arthritis    Coronary artery disease, non-occlusive 08/2013   Mild to moderate (40% OM1) single vessel CAD. Otherwise nonobstructed.   Depression    Dyslipidemia, goal LDL below 100    Essential hypertension    Family history of premature CAD    GERD (gastroesophageal reflux disease)    H/O skin disorder    Involving hands. Subsequently resolved.    Nonischemic cardiomyopathy ([place]) 05/2013   Non-ischemic: EF ~45% by Echo & Myoview --> Non-obstructive CAD   Obesity (BMI 30.0-34.9)    OSA (obstructive sleep apnea) 06/21/2017    Objective: BP 122/80   Pulse (!) 58   Temp 97.9 F (36.6 C) (Oral)   Ht 5\' 11"  (1.803 m)   Wt 223 lb 8 oz (101.4 kg)   SpO2 96%   BMI 31.17 kg/m  General: Awake, appears stated age MSK: Tenderness to palpation over the left greater trochanter.  There is no pain with resisted left hip abduction.  Negative logroll, FABER, FADDIR on L.  Neuro: Gait is antalgic. Lungs: No accessory muscle use Psych: Age appropriate judgment and insight, normal affect and mood  Procedure note: Greater trochanteric bursa injection Verbal consent obtained. The area of interest was palpated and demarcated with an otoscope  speculum. It was cleaned with an alcohol swab. Freeze spray was used. A 25 g needle was inserted at a perpendicular angle through the area of interested. The plunger was withdrawn to ensure our placement was not in a vessel. 2 mL of 1% lidocaine without epi and 40 mg of Depo-Medrol was injected. A bandaid was placed. The Charles Grimes tolerated the procedure well.  There were no complications noted.    Assessment and Plan: Left hip pain - Plan: PR DRAIN/INJECT LARGE JOINT/BURSA, methylPREDNISolone acetate (DEPO-MEDROL) injection 40 mg  Acute pancreatitis, unspecified complication status, unspecified pancreatitis type  Chronic, uncontrolled.  Injection.  If no improvement the next couple weeks, will refer to physical therapy. Appears to be resolving.  We will check CT abdomen w contrast if not improving.  Follow-up as originally scheduled. The Charles Grimes and his spouse voiced understanding and agreement to the plan.  [place], DO 04/06/21  1:18 PM

## 2021-04-06 NOTE — Patient Instructions (Signed)
With the continued improvement, I do not think we need to do anything further.  Ice/cold pack over area for 10-15 min twice daily.  OK to take Tylenol 1000 mg (2 extra strength tabs) or 975 mg (3 regular strength tabs) every 6 hours as needed.  Send me a message in 3-4 weeks if no better with the hip. We will get you set up with physical therapy.   Let us know if you need anything.

## 2021-06-21 ENCOUNTER — Telehealth: Payer: Self-pay | Admitting: Family Medicine

## 2021-06-21 NOTE — Telephone Encounter (Signed)
Left message for patient to call back and schedule Medicare Annual Wellness Visit (AWV) either virtually or in office.   **AWVS DUE 06/16/2016 PER [place] please schedule at anytime with health coach

## 2021-06-28 ENCOUNTER — Ambulatory Visit (INDEPENDENT_AMBULATORY_CARE_PROVIDER_SITE_OTHER): Payer: 59

## 2021-06-28 VITALS — Ht 71.0 in | Wt 223.0 lb

## 2021-06-28 DIAGNOSIS — Z0000 Encounter for general adult medical examination without abnormal findings: Secondary | ICD-10-CM

## 2021-06-28 NOTE — Progress Notes (Signed)
Subjective:   Charles Grimes is a 65 y.o. male who presents for Medicare Annual/Subsequent preventive examination.  I connected with  Charles Grimes today by a video enabled telemedicine application and verified that I am speaking with the correct person using two identifiers.  Location of patient:Home Location of provider:Work  Persons participating in the virtual visit: patient, nurse.   I discussed the limitations, risk, security and privacy concerns of evaluation and management by telemedicine. The patient expressed understanding and agreed to proceed.  Some vital signs may be absent or patient reported.    Review of Systems     Cardiac Risk Factors include: advanced age (>83men, >54 women);hypertension;dyslipidemia;obesity (BMI >30kg/m2);smoking/ tobacco exposure     Objective:    Today's Vitals   06/28/21 1539 06/28/21 1540  Weight: 223 lb (101.2 kg)   Height: 5\' 11"  (1.803 m)   PainSc:  2    Body mass index is 31.1 kg/m.  Advanced Directives 06/28/2021 03/17/2021 01/22/2020 04/12/2017 01/02/2017 05/21/2015 02/21/2015  Does Patient Have a Medical Advance Directive? No No No No No No No  Would patient like information on creating a medical advance directive? Yes (MAU/Ambulatory/Procedural Areas - Information given) No - Patient declined - No - Patient declined - No - patient declined information No - patient declined information    Current Medications (verified) Outpatient Encounter Medications as of 06/28/2021  Medication Sig   acetaminophen (TYLENOL) 500 MG tablet Take 1,000 mg by mouth every 6 (six) hours as needed for mild pain, fever or headache.   aspirin EC 81 MG tablet Take 81 mg by mouth daily.   diclofenac Sodium (VOLTAREN) 1 % GEL Apply 2 g topically 4 (four) times daily. (Patient taking differently: Apply 2 g topically 4 (four) times daily as needed (hip pain).)   lisinopril (ZESTRIL) 10 MG tablet Take 1 tablet (10 mg total) by mouth daily.   nitroGLYCERIN (NITROSTAT)  0.4 MG SL tablet Place 1 tablet (0.4 mg total) under the tongue every 5 (five) minutes as needed for chest pain.   rosuvastatin (CRESTOR) 20 MG tablet Take 1 tablet (20 mg total) by mouth daily.   sertraline (ZOLOFT) 100 MG tablet Take 1 tablet (100 mg total) by mouth daily. (Patient taking differently: Take 100 mg by mouth every morning.)   sertraline (ZOLOFT) 50 MG tablet Take 1 tablet (50 mg total) by mouth daily. Take for a total of 150 mg daily. (Patient taking differently: Take 50 mg by mouth every evening.)   triamcinolone cream (KENALOG) 0.1 % Apply 1 application topically 3 (three) times daily. (Patient taking differently: Apply 1 application topically 3 (three) times daily as needed (psoriasis on hands).)   No facility-administered encounter medications on file as of 06/28/2021.    Allergies (verified) Isosorbide nitrate, Oxycodone, and Penicillins   History: Past Medical History:  Diagnosis Date   Anginal pain (HCC)    Anxiety    Arthritis    Coronary artery disease, non-occlusive 08/2013   Mild to moderate (40% OM1) single vessel CAD. Otherwise nonobstructed.   Depression    Dyslipidemia, goal LDL below 100    Essential hypertension    Family history of premature CAD    GERD (gastroesophageal reflux disease)    H/O skin disorder    Involving hands. Subsequently resolved.    Nonischemic cardiomyopathy ([place]) 05/2013   Non-ischemic: EF ~45% by Echo & Myoview --> Non-obstructive CAD   Obesity (BMI 30.0-34.9)    OSA (obstructive sleep apnea) 06/21/2017   Past Surgical  History:  Procedure Laterality Date   LEFT HEART CATHETERIZATION WITH CORONARY ANGIOGRAM  08/26/2013   Mild to moderate disease with 40-50% stenosis and OM1. Otherwise no significant CAD --  Surgeon: Leonie Man, MD;  Location: [facility] CATH LAB;  Service: Cardiovascular;;   Lower extremity arterial Dopplers  06/11/2013   No occlusive disease   LUMBAR LAMINECTOMY/DECOMPRESSION MICRODISCECTOMY Right 10/07/2014    Procedure: LUMBAR LAMINECTOMY/DECOMPRESSION MICRODISCECTOMY 1 LEVEL;  Surgeon: Kary Kos, MD;  Location: [facility] NEURO ORS;  Service: Neurosurgery;  Laterality: Right;  Right L3-L4 Microdiscectomy   NM MYOVIEW LTD  06/03/2013   Low risk study with no ischemia. EF roughly 44% with no regional wall motion abnormalities noted   PFTs  06/2013   Normal Volumes &  Spirometry; Moderately reduced DLCO   SHOULDER HEMI-ARTHROPLASTY Right 05/28/2015   Procedure: RIGHT SHOULDER HEMI-ARTHROPLASTY CTA HEAD AND SUBSCAP REPAIR ;  Surgeon: Netta Cedars, MD;  Location: [facility];  Service: Orthopedics;  Laterality: Right;   TRANSTHORACIC ECHOCARDIOGRAM  06/11/2013   Mildly reduced EF: 45-50%. Mild anterior hypokinesis with incoordinate septal motion. No evidence of pulmonary hypertension   Family History  Problem Relation Age of Onset   Atrial fibrillation Mother        Alive at 77   COPD Mother    Hypertension Mother    Colonic polyp Mother    Hypertension Father        Alive at 30   Lung cancer Father        Chronic smoker   Factor V Leiden deficiency Father        Also Lupus anticoagulant   Heart attack Maternal Grandmother 53   Heart failure Paternal Grandmother    Diabetes Paternal Grandmother    Heart attack Brother 45       X2   Heart attack Sister 55       Deceased   Healthy Sister        x2   Healthy Son        x2   Colon cancer Neg Hx    Esophageal cancer Neg Hx    Rectal cancer Neg Hx    Stomach cancer Neg Hx    Social History   Socioeconomic History   Marital status: Married    Spouse name: Not on file   Number of children: Not on file   Years of education: Not on file   Highest education level: Not on file  Occupational History   Not on file  Tobacco Use   Smoking status: Every Day    Packs/day: 1.00    Years: 42.00    Pack years: 42.00    Types: Cigarettes    Last attempt to quit: 08/27/2017    Years since quitting: 3.8   Smokeless tobacco: Never  Vaping Use   Vaping Use:  Never used  Substance and Sexual Activity   Alcohol use: Not Currently    Comment: rarely   Drug use: No   Sexual activity: Yes    Partners: Female  Other Topics Concern   Not on file  Social History Narrative   Married father of 2 boys. Lives with wife, [name], and one son.   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   Only occasional beer and mixed drinks.   Does not exercise because he is too tired.   Social  Determinants of Health   Financial Resource Strain: Low Risk    Difficulty of Paying Living Expenses: Not hard at all  Food Insecurity: No Food Insecurity   Worried About [place] in the Last Year: Never true   Ran Out of Food in the Last Year: Never true  Transportation Needs: No Transportation Needs   Lack of Transportation (Medical): No   Lack of Transportation (Non-Medical): No  Physical Activity: Inactive   Days of Exercise per Week: 0 days   Minutes of Exercise per Session: 0 min  Stress: No Stress Concern Present   Feeling of Stress : Not at all  Social Connections: Moderately Isolated   Frequency of Communication with Friends and Family: More than three times a week   Frequency of Social Gatherings with Friends and Family: More than three times a week   Attends Religious Services: Never   Marine scientist or Organizations: No   Attends Music therapist: Never   Marital Status: Married    Tobacco Counseling Ready to quit: Not Answered Counseling given: Not Answered   Clinical Intake:  Pre-visit preparation completed: Yes  Pain : 0-10 Pain Score: 2  Pain Type: Chronic pain Pain Location: Hip Pain Onset: More than a month ago Pain Frequency: Constant     BMI - recorded: 31.1 Nutritional Status: BMI > 30  Obese Nutritional Risks: None Diabetes: No  How often do you need to have someone help you  when you read instructions, pamphlets, or other written materials from your doctor or pharmacy?: 1 - Never  Diabetic?No  Interpreter Needed?: No  Information entered by :: Caroleen Hamman LPN   Activities of Daily Living In your present state of health, do you have any difficulty performing the following activities: 06/28/2021 06/27/2021  Hearing? N Y  Vision? N N  Difficulty concentrating or making decisions? N N  Walking or climbing stairs? N N  Dressing or bathing? N N  Doing errands, shopping? N N  Preparing Food and eating ? N N  Using the Toilet? N N  In the past six months, have you accidently leaked urine? N N  Do you have problems with loss of bowel control? N N  Managing your Medications? N N  Managing your Finances? N N  Housekeeping or managing your Housekeeping? N N  Some recent data might be hidden    Patient Care Team: [name], DO as PCP - General (Family Medicine)  Indicate any recent Medical Services you may have received from other than [facility] providers in the past year (date may be approximate).     Assessment:   This is a routine wellness examination for Charles Grimes.  Hearing/Vision screen Hearing Screening - Comments:: C/o mild hearing loss Vision Screening - Comments:: Last eye [place]( [place])  Dietary issues and exercise activities discussed: Current Exercise Habits: The patient does not participate in regular exercise at present, Exercise limited by: None identified   Goals Addressed             This Visit's Progress    Quit smoking / using tobacco   Not on track      Depression Screen PHQ 2/9 Scores 06/28/2021 02/08/2021 05/02/2016 10/25/2015 03/09/2014  PHQ - 2 Score 0 0 0 0 1  PHQ- 9 Score - - - 1 -    Fall Risk Fall Risk  06/28/2021 06/27/2021 02/08/2021 10/25/2015  Falls in the past year? 1 1 1  No  Number falls in past yr: 1 1 0 -  Injury with Fall? 0 0 0 -  Risk for fall due to : History of  fall(s) - No Fall Risks -  Follow up Falls prevention discussed - Falls evaluation completed -    FALL RISK PREVENTION PERTAINING TO THE HOME:  Any stairs in or around the home? No  Home free of loose throw rugs in walkways, pet beds, electrical cords, etc? Yes  Adequate lighting in your home to reduce risk of falls? Yes   ASSISTIVE DEVICES UTILIZED TO PREVENT FALLS:  Life alert? No  Use of a cane, walker or w/c? No  Grab bars in the bathroom? No  Shower chair or bench in shower? No  Elevated toilet seat or a handicapped toilet? No   TIMED UP AND GO:  Was the test performed? No . Phone visit   Cognitive Function:Normal cognitive status assessed by this Nurse Health Advisor. No abnormalities found.          Immunizations Immunization History  Administered Date(s) Administered   Influenza,inj,Quad PF,6+ Mos 06/10/2014, 06/20/2017, 04/15/2018, 04/06/2020   PFIZER(Purple Top)SARS-COV-2 Vaccination 04/06/2020, 04/27/2020   Tdap 01/01/2014    TDAP status: Up to date  Flu Vaccine status: Due, Education has been provided regarding the importance of this vaccine. Advised may receive this vaccine at local pharmacy or Health Dept. Aware to provide a copy of the vaccination record if obtained from local pharmacy or Health Dept. Verbalized acceptance and understanding.  Pneumococcal vaccine status: Due, Education has been provided regarding the importance of this vaccine. Advised may receive this vaccine at local pharmacy or Health Dept. Aware to provide a copy of the vaccination record if obtained from local pharmacy or Health Dept. Verbalized acceptance and understanding.  Covid-19 vaccine status: Information provided on how to obtain vaccines.   Qualifies for Shingles Vaccine? Yes   Zostavax completed No   Shingrix Completed?: No.    Education has been provided regarding the importance of this vaccine. Patient has been advised to call insurance company to determine out of pocket  expense if they have not yet received this vaccine. Advised may also receive vaccine at local pharmacy or Health Dept. Verbalized acceptance and understanding.  Screening Tests Health Maintenance  Topic Date Due   Zoster Vaccines- Shingrix (1 of 2) Never done   COLONOSCOPY (Pts 45-64yrs Insurance coverage will need to be confirmed)  01/03/2020   COVID-19 Vaccine (3 - Booster for Pfizer series) 06/22/2020   INFLUENZA VACCINE  02/14/2021   TETANUS/TDAP  01/02/2024   Hepatitis C Screening  Completed   HIV Screening  Completed   HPV VACCINES  Aged Out   Pneumococcal Vaccine 70-57 Years old  [place] Maintenance Due  Topic Date Due   Zoster Vaccines- Shingrix (1 of 2) Never done   COLONOSCOPY (Pts 45-63yrs Insurance coverage will need to be confirmed)  01/03/2020   COVID-19 Vaccine (3 - Booster for [place] series) 06/22/2020   INFLUENZA VACCINE  02/14/2021    Colorectal cancer screening: Due-Patient to call his GI doctor to schedule.  Lung Cancer Screening: (Low Dose CT Chest recommended if Age 26-80 years, 30 pack-year currently smoking OR have quit w/in 15years.) does qualify.   Lung Cancer Screening Referral: Patient to discuss with PCP at next office visit  Additional Screening:  Hepatitis C Screening: Completed 09/03/2017  Vision Screening: Recommended annual ophthalmology exams for early detection of glaucoma and other disorders of the eye.  Is the patient up to date with their annual eye exam?  Yes  Who is the provider or what is the name of the office in which the patient attends annual eye exams? [facility]   Dental Screening: Recommended annual dental exams for proper oral hygiene  Community Resource Referral / Chronic Care Management: CRR required this visit?  No   CCM required this visit?  No      Plan:     I have personally reviewed and noted the following in the patients chart:   Medical and social history Use of alcohol,  tobacco or illicit drugs  Current medications and supplements including opioid prescriptions. Patient is not currently taking opioid prescriptions. Functional ability and status Nutritional status Physical activity Advanced directives List of other physicians Hospitalizations, surgeries, and ER visits in previous 12 months Vitals Screenings to include cognitive, depression, and falls Referrals and appointments  In addition, I have reviewed and discussed with patient certain preventive protocols, quality metrics, and best practice recommendations. A written personalized care plan for preventive services as well as general preventive health recommendations were provided to patient.   Due to this being a telephonic visit, the after visit summary with patients personalized plan was offered to patient via mail or my-chart. Patient would like to access on my-chart.    Marta Antu, LPN   [id number]  Nurse Health Advisor  Nurse Notes: None

## 2021-06-28 NOTE — Patient Instructions (Signed)
[name] , Thank you for taking time to complete your Medicare Wellness Visit. I appreciate your ongoing commitment to your health goals. Please review the following plan we discussed and let me know if I can assist you in the future.   Screening recommendations/referrals: Colonoscopy: Due-Per our conversation you will call GI to schedule. Recommended yearly ophthalmology/optometry visit for glaucoma screening and checkup Recommended yearly dental visit for hygiene and checkup  Vaccinations: Influenza vaccine: Due-May obtain vaccine at our office or your local pharmacy. Pneumococcal vaccine: Due-May obtain vaccine at our office or your local pharmacy. Tdap vaccine: Up to date Shingles vaccine: Discuss with pharmacy   Covid-19: Booster available at the pharmacy.  Advanced directives: Information mailed today.  Conditions/risks identified: See problem list  Next appointment: Follow up in one year for your annual wellness visit.   Preventive Care 64 Years and Older, Male Preventive care refers to lifestyle choices and visits with your health care provider that can promote health and wellness. What does preventive care include? A yearly physical exam. This is also called an annual well check. Dental exams once or twice a year. Routine eye exams. Ask your health care provider how often you should have your eyes checked. Personal lifestyle choices, including: Daily care of your teeth and gums. Regular physical activity. Eating a healthy diet. Avoiding tobacco and drug use. Limiting alcohol use. Practicing safe sex. Taking low doses of aspirin every day. Taking vitamin and mineral supplements as recommended by your health care provider. What happens during an annual well check? The services and screenings done by your health care provider during your annual well check will depend on your age, overall health, lifestyle risk factors, and family history of disease. Counseling  Your  health care provider may ask you questions about your: Alcohol use. Tobacco use. Drug use. Emotional well-being. Home and relationship well-being. Sexual activity. Eating habits. History of falls. Memory and ability to understand (cognition). Work and work Statistician. Screening  You may have the following tests or measurements: Height, weight, and BMI. Blood pressure. Lipid and cholesterol levels. These may be checked every 5 years, or more frequently if you are over 64 years old. Skin check. Lung cancer screening. You may have this screening every year starting at age 64 if you have a 30-pack-year history of smoking and currently smoke or have quit within the past 15 years. Fecal occult blood test (FOBT) of the stool. You may have this test every year starting at age 64. Flexible sigmoidoscopy or colonoscopy. You may have a sigmoidoscopy every 5 years or a colonoscopy every 10 years starting at age 64. Prostate cancer screening. Recommendations will vary depending on your family history and other risks. Hepatitis C blood test. Hepatitis B blood test. Sexually transmitted disease (STD) testing. Diabetes screening. This is done by checking your blood sugar (glucose) after you have not eaten for a while (fasting). You may have this done every 1-3 years. Abdominal aortic aneurysm (AAA) screening. You may need this if you are a current or former smoker. Osteoporosis. You may be screened starting at age 64 if you are at high risk. Talk with your health care provider about your test results, treatment options, and if necessary, the need for more tests. Vaccines  Your health care provider may recommend certain vaccines, such as: Influenza vaccine. This is recommended every year. Tetanus, diphtheria, and acellular pertussis (Tdap, Td) vaccine. You may need a Td booster every 10 years. Zoster vaccine. You may need this after  age 64. Pneumococcal 13-valent conjugate (PCV13) vaccine. One dose  is recommended after age 64 Pneumococcal polysaccharide (PPSV23) vaccine. One dose is recommended after age 64. Talk to your health care provider about which screenings and vaccines you need and how often you need them. This information is not intended to replace advice given to you by your health care provider. Make sure you discuss any questions you have with your health care provider. Document Released: 07/30/2015 Document Revised: 03/22/2016 Document Reviewed: 05/04/2015 Elsevier Interactive Patient Education  2017 [employer] Prevention in the Home Falls can cause injuries. They can happen to people of all ages. There are many things you can do to make your home safe and to help prevent falls. What can I do on the outside of my home? Regularly fix the edges of walkways and driveways and fix any cracks. Remove anything that might make you trip as you walk through a door, such as a raised step or threshold. Trim any bushes or trees on the path to your home. Use bright outdoor lighting. Clear any walking paths of anything that might make someone trip, such as rocks or tools. Regularly check to see if handrails are loose or broken. Make sure that both sides of any steps have handrails. Any raised decks and porches should have guardrails on the edges. Have any leaves, snow, or ice cleared regularly. Use sand or salt on walking paths during winter. Clean up any spills in your garage right away. This includes oil or grease spills. What can I do in the bathroom? Use night lights. Install grab bars by the toilet and in the tub and shower. Do not use towel bars as grab bars. Use non-skid mats or decals in the tub or shower. If you need to sit down in the shower, use a plastic, non-slip stool. Keep the floor dry. Clean up any water that spills on the floor as soon as it happens. Remove soap buildup in the tub or shower regularly. Attach bath mats securely with double-sided non-slip rug  tape. Do not have throw rugs and other things on the floor that can make you trip. What can I do in the bedroom? Use night lights. Make sure that you have a light by your bed that is easy to reach. Do not use any sheets or blankets that are too big for your bed. They should not hang down onto the floor. Have a firm chair that has side arms. You can use this for support while you get dressed. Do not have throw rugs and other things on the floor that can make you trip. What can I do in the kitchen? Clean up any spills right away. Avoid walking on wet floors. Keep items that you use a lot in easy-to-reach places. If you need to reach something above you, use a strong step stool that has a grab bar. Keep electrical cords out of the way. Do not use floor polish or wax that makes floors slippery. If you must use wax, use non-skid floor wax. Do not have throw rugs and other things on the floor that can make you trip. What can I do with my stairs? Do not leave any items on the stairs. Make sure that there are handrails on both sides of the stairs and use them. Fix handrails that are broken or loose. Make sure that handrails are as long as the stairways. Check any carpeting to make sure that it is firmly attached to the stairs.  Fix any carpet that is loose or worn. Avoid having throw rugs at the top or bottom of the stairs. If you do have throw rugs, attach them to the floor with carpet tape. Make sure that you have a light switch at the top of the stairs and the bottom of the stairs. If you do not have them, ask someone to add them for you. What else can I do to help prevent falls? Wear shoes that: Do not have high heels. Have rubber bottoms. Are comfortable and fit you well. Are closed at the toe. Do not wear sandals. If you use a stepladder: Make sure that it is fully opened. Do not climb a closed stepladder. Make sure that both sides of the stepladder are locked into place. Ask someone to  hold it for you, if possible. Clearly mark and make sure that you can see: Any grab bars or handrails. First and last steps. Where the edge of each step is. Use tools that help you move around (mobility aids) if they are needed. These include: Canes. Walkers. Scooters. Crutches. Turn on the lights when you go into a dark area. Replace any light bulbs as soon as they burn out. Set up your furniture so you have a clear path. Avoid moving your furniture around. If any of your floors are uneven, fix them. If there are any pets around you, be aware of where they are. Review your medicines with your doctor. Some medicines can make you feel dizzy. This can increase your chance of falling. Ask your doctor what other things that you can do to help prevent falls. This information is not intended to replace advice given to you by your health care provider. Make sure you discuss any questions you have with your health care provider. Document Released: 04/29/2009 Document Revised: 12/09/2015 Document Reviewed: 08/07/2014 Elsevier Interactive Patient Education  2017 [employer].

## 2021-07-26 DIAGNOSIS — Z20822 Contact with and (suspected) exposure to covid-19: Secondary | ICD-10-CM | POA: Diagnosis not present

## 2021-08-16 ENCOUNTER — Encounter: Payer: Self-pay | Admitting: Family Medicine

## 2021-08-16 ENCOUNTER — Ambulatory Visit (INDEPENDENT_AMBULATORY_CARE_PROVIDER_SITE_OTHER): Payer: Medicare Other | Admitting: Family Medicine

## 2021-08-16 VITALS — BP 138/84 | HR 72 | Temp 97.9°F | Ht 71.0 in | Wt 223.0 lb

## 2021-08-16 DIAGNOSIS — I2510 Atherosclerotic heart disease of native coronary artery without angina pectoris: Secondary | ICD-10-CM

## 2021-08-16 DIAGNOSIS — J432 Centrilobular emphysema: Secondary | ICD-10-CM | POA: Diagnosis not present

## 2021-08-16 DIAGNOSIS — F3342 Major depressive disorder, recurrent, in full remission: Secondary | ICD-10-CM | POA: Diagnosis not present

## 2021-08-16 DIAGNOSIS — I739 Peripheral vascular disease, unspecified: Secondary | ICD-10-CM | POA: Diagnosis not present

## 2021-08-16 DIAGNOSIS — Z23 Encounter for immunization: Secondary | ICD-10-CM

## 2021-08-16 DIAGNOSIS — E785 Hyperlipidemia, unspecified: Secondary | ICD-10-CM

## 2021-08-16 DIAGNOSIS — M2550 Pain in unspecified joint: Secondary | ICD-10-CM

## 2021-08-16 DIAGNOSIS — I10 Essential (primary) hypertension: Secondary | ICD-10-CM | POA: Diagnosis not present

## 2021-08-16 DIAGNOSIS — I428 Other cardiomyopathies: Secondary | ICD-10-CM

## 2021-08-16 DIAGNOSIS — F411 Generalized anxiety disorder: Secondary | ICD-10-CM | POA: Diagnosis not present

## 2021-08-16 LAB — LIPID PANEL
Cholesterol: 139 mg/dL (ref 0–200)
HDL: 33.7 mg/dL — ABNORMAL LOW (ref >39.00)
LDL Cholesterol: 81 mg/dL (ref 0–99)
NonHDL: 105.33
Total CHOL/HDL Ratio: 4
Triglycerides: 123 mg/dL (ref 0.0–149.0)
VLDL: 24.6 mg/dL (ref 0.0–40.0)

## 2021-08-16 LAB — COMPREHENSIVE METABOLIC PANEL
ALT: 17 U/L (ref 0–53)
AST: 21 U/L (ref 0–37)
Albumin: 3.7 g/dL (ref 3.5–5.2)
Alkaline Phosphatase: 85 U/L (ref 39–117)
BUN: 16 mg/dL (ref 6–23)
CO2: 27 mEq/L (ref 19–32)
Calcium: 8.7 mg/dL (ref 8.4–10.5)
Chloride: 107 mEq/L (ref 96–112)
Creatinine, Ser: 0.79 mg/dL (ref 0.40–1.50)
GFR: 93.51 mL/min (ref >60.00)
Glucose, Bld: 93 mg/dL (ref 70–99)
Potassium: 4 mEq/L (ref 3.5–5.1)
Sodium: 140 mEq/L (ref 135–145)
Total Bilirubin: 0.8 mg/dL (ref 0.2–1.2)
Total Protein: 6.9 g/dL (ref 6.0–8.3)

## 2021-08-16 LAB — URIC ACID: Uric Acid, Serum: 5.5 mg/dL (ref 4.0–7.8)

## 2021-08-16 MED ORDER — SERTRALINE HCL 100 MG PO TABS: 100.0000 mg | ORAL_TABLET | Freq: Every day | ORAL | 2 refills | Status: DC

## 2021-08-16 MED ORDER — ALLOPURINOL 100 MG PO TABS: 100.0000 mg | ORAL_TABLET | Freq: Every day | ORAL | 6 refills | Status: DC

## 2021-08-16 MED ORDER — SERTRALINE HCL 50 MG PO TABS: 50.0000 mg | ORAL_TABLET | Freq: Every day | ORAL | 2 refills | Status: DC

## 2021-08-16 MED ORDER — TRIAMCINOLONE ACETONIDE 0.1 % EX CREA: 1.0000 "application " | TOPICAL_CREAM | Freq: Three times a day (TID) | CUTANEOUS | 2 refills | Status: DC | PRN

## 2021-08-16 NOTE — Addendum Note (Signed)
Addended by: Sharon Seller B on: 08/16/2021 08:46 AM   Modules accepted: Orders

## 2021-08-16 NOTE — Progress Notes (Signed)
Chief Complaint  Patient presents with   Follow-up    6 month    Subjective Charles Grimes presents for f/u anxiety/depression.  Pt is currently being treated with Zoloft 150 mg/d.  Reports doing well since treatment. No thoughts of harming self or others. No self-medication with alcohol, prescription drugs or illicit drugs. Pt is not following with a counselor/psychologist.  Hypertension Patient presents for hypertension follow up. He does not routinely monitor home blood pressures. He is compliant with medication- lisinopril 10 mg/d. Patient has these side effects of medication: none He is adhering to a healthy diet overall. Exercise: active in yard/house.  No Cp or SOB.  Dyslipidemia Patient presents for dyslipidemia follow up. Currently being treated with Crestor 20 mg/d and compliance with treatment thus far has been good. He denies myalgias. Diet/exercise as above.  The patient is not known to have coexisting coronary artery disease.  Past Medical History:  Diagnosis Date   Anginal pain ([place])    Anxiety    Arthritis    Coronary artery disease, non-occlusive 08/2013   Mild to moderate (40% OM1) single vessel CAD. Otherwise nonobstructed.   Depression    Dyslipidemia, goal LDL below 100    Essential hypertension    Family history of premature CAD    GERD (gastroesophageal reflux disease)    H/O skin disorder    Involving hands. Subsequently resolved.    Nonischemic cardiomyopathy ([place]) 05/2013   Non-ischemic: EF ~45% by Echo & Myoview --> Non-obstructive CAD   Obesity (BMI 30.0-34.9)    OSA (obstructive sleep apnea) 06/21/2017   Allergies as of 08/16/2021       Reactions   Isosorbide Nitrate Other (See Comments)   CONTINUOUS HEADACHE    Oxycodone Itching   Reaction was to straight oxycodone 15 mg tablets - no reaction to percocet   Penicillins Hives, Rash   Has patient had a PCN reaction causing immediate rash, facial/tongue/throat swelling, SOB or  lightheadedness with hypotension: Yes Has patient had a PCN reaction causing severe rash involving mucus membranes or skin necrosis: No Has patient had a PCN reaction that required hospitalization No Has patient had a PCN reaction occurring within the last 10 years: No If all of the above answers are "NO", then may proceed with Cephalosporin use.        Medication List        Accurate as of August 16, 2021  8:29 AM. If you have any questions, ask your nurse or doctor.          STOP taking these medications    diclofenac Sodium 1 % Gel Commonly known as: Voltaren Stopped by: [name], DO       TAKE these medications    acetaminophen 500 MG tablet Commonly known as: TYLENOL Take 1,000 mg by mouth every 6 (six) hours as needed for mild pain, fever or headache.   aspirin EC 81 MG tablet Take 81 mg by mouth daily.   lisinopril 10 MG tablet Commonly known as: ZESTRIL Take 1 tablet (10 mg total) by mouth daily.   nitroGLYCERIN 0.4 MG SL tablet Commonly known as: NITROSTAT Place 1 tablet (0.4 mg total) under the tongue every 5 (five) minutes as needed for chest pain.   rosuvastatin 20 MG tablet Commonly known as: Crestor Take 1 tablet (20 mg total) by mouth daily.   sertraline 50 MG tablet Commonly known as: ZOLOFT Take 1 tablet (50 mg total) by mouth daily. Take for a total of  150 mg daily. What changed:  when to take this additional instructions   sertraline 100 MG tablet Commonly known as: ZOLOFT Take 1 tablet (100 mg total) by mouth daily. What changed: when to take this   triamcinolone cream 0.1 % Commonly known as: KENALOG Apply 1 application topically 3 (three) times daily. What changed:  when to take this reasons to take this        Exam BP 138/84    Pulse 72    Temp 97.9 F (36.6 C) (Oral)    Ht 5\' 11"  (1.803 m)    Wt 223 lb (101.2 kg)    SpO2 98%    BMI 31.10 kg/m  General:  well developed, well nourished, in no apparent  distress Heart: RRR Lungs:  CTAB. No respiratory distress Psych: well oriented with normal range of affect and age-appropriate judgement/insight, alert and oriented x4.  Assessment and Plan  GAD (generalized anxiety disorder)  Recurrent major depressive disorder, in full remission ([place])  Essential hypertension  Dyslipidemia - Plan: Comprehensive metabolic panel, Lipid panel  Centrilobular emphysema (HCC), Chronic  Nonischemic cardiomyopathy (HCC), Chronic  Intermittent claudication ([place]), Chronic  Arthralgia, unspecified joint - Plan: Uric acid  1/2. Chronic, stable. Cont Zoloft 150 mg/d. 3. Chronic, stable. Cont lisinopril 10 mg/d. Counseled on diet/exercise.  4. Chronic, stable. Cont Crestor 20 mg/d.  5. Arthralgias after eating beef. Will ck urate and trial allopurinol 100 mg/d.  PCV20 and flu shot today. Dr. Vena Rua contact info provided for f/u [facility].  F/u in 6 mo for med ck. The patient voiced understanding and agreement to the plan.  [place], DO 08/16/21 8:29 AM

## 2021-08-16 NOTE — Patient Instructions (Addendum)
Give [place] 2-3 business days to get the results of your labs back.   Keep the diet clean and stay active.  Don't get the booster for covid for another 3 months.   Please call Dr. Hilarie Fredrickson to get your follow up colonoscopy done. [phone].  Let us know if you need anything.

## 2021-08-19 DIAGNOSIS — Z20822 Contact with and (suspected) exposure to covid-19: Secondary | ICD-10-CM | POA: Diagnosis not present

## 2022-02-20 ENCOUNTER — Ambulatory Visit (AMBULATORY_SURGERY_CENTER): Payer: Self-pay | Admitting: *Deleted

## 2022-02-20 VITALS — Ht 71.0 in | Wt 212.0 lb

## 2022-02-20 DIAGNOSIS — Z86010 Personal history of colonic polyps: Secondary | ICD-10-CM

## 2022-02-20 MED ORDER — NA SULFATE-K SULFATE-MG SULF 17.5-3.13-1.6 GM/177ML PO SOLN
1.0000 | ORAL | 0 refills | Status: DC
Start: 2022-02-20 — End: 2022-03-06

## 2022-02-20 NOTE — Progress Notes (Signed)
Patient and wife is here in-person for PV. Patient denies any allergies to eggs or soy. Patient denies any problems with anesthesia/sedation. Patient is not on any oxygen at home. Patient is not taking any diet/weight loss medications or blood thinners. Went over procedure prep instructions with the patient. Patient is aware of our care-partner policy. Patient notified to use Singlecare or Good-Rx for prescription.  Patient denies constipation.

## 2022-03-06 ENCOUNTER — Encounter: Payer: Self-pay | Admitting: Internal Medicine

## 2022-03-06 ENCOUNTER — Ambulatory Visit (AMBULATORY_SURGERY_CENTER): Payer: 59 | Admitting: Internal Medicine

## 2022-03-06 VITALS — BP 123/69 | HR 53 | Temp 97.3°F | Resp 14 | Ht 71.0 in | Wt 212.0 lb

## 2022-03-06 DIAGNOSIS — D122 Benign neoplasm of ascending colon: Secondary | ICD-10-CM

## 2022-03-06 DIAGNOSIS — Z86010 Personal history of colonic polyps: Secondary | ICD-10-CM | POA: Diagnosis not present

## 2022-03-06 DIAGNOSIS — D123 Benign neoplasm of transverse colon: Secondary | ICD-10-CM | POA: Diagnosis not present

## 2022-03-06 DIAGNOSIS — Z09 Encounter for follow-up examination after completed treatment for conditions other than malignant neoplasm: Secondary | ICD-10-CM | POA: Diagnosis not present

## 2022-03-06 DIAGNOSIS — D124 Benign neoplasm of descending colon: Secondary | ICD-10-CM

## 2022-03-06 DIAGNOSIS — D125 Benign neoplasm of sigmoid colon: Secondary | ICD-10-CM | POA: Diagnosis not present

## 2022-03-06 MED ORDER — SODIUM CHLORIDE 0.9 % IV SOLN
500.0000 mL | INTRAVENOUS | Status: DC
Start: 2022-03-06 — End: 2022-03-06

## 2022-03-06 NOTE — Progress Notes (Signed)
Called to room to assist during endoscopic procedure.  Patient ID and intended procedure confirmed with present staff. Received instructions for my participation in the procedure from the performing physician.  

## 2022-03-06 NOTE — Progress Notes (Signed)
GASTROENTEROLOGY PROCEDURE H&P NOTE   Primary Care Physician: [name], DO    Reason for Procedure:  History of multiple adenomatous colon polyps  Plan:    Colonoscopy  Patient is appropriate for endoscopic procedure(s) in the ambulatory ([facility]) setting.  The nature of the procedure, as well as the risks, benefits, and alternatives were carefully and thoroughly reviewed with the patient. Ample time for discussion and questions allowed. The patient understood, was satisfied, and agreed to proceed.     HPI: Charles Grimes is a 66 y.o. male who presents for surveillance colonoscopy.  Medical history as below.  Tolerated the prep.  No recent chest pain or shortness of breath.  No abdominal pain today.  Past Medical History:  Diagnosis Date   Anginal pain ([place])    Anxiety    Arthritis    Coronary artery disease, non-occlusive 08/2013   Mild to moderate (40% OM1) single vessel CAD. Otherwise nonobstructed.   Depression    Dyslipidemia, goal LDL below 100    Essential hypertension    Family history of premature CAD    GERD (gastroesophageal reflux disease)    H/O skin disorder    Involving hands. Subsequently resolved.    Nonischemic cardiomyopathy ([place]) 05/2013   Non-ischemic: EF ~45% by Echo & Myoview --> Non-obstructive CAD   Obesity (BMI 30.0-34.9)    OSA (obstructive sleep apnea) 06/21/2017   uses CPAP   Sleep apnea     Past Surgical History:  Procedure Laterality Date   COLONOSCOPY  2015   COLONOSCOPY WITH PROPOFOL  01/02/2017   Dr.Camdynn Maranto   LEFT HEART CATHETERIZATION WITH CORONARY ANGIOGRAM  08/26/2013   Mild to moderate disease with 40-50% stenosis and OM1. Otherwise no significant CAD --  Surgeon: Leonie Man, MD;  Location: [facility] C[facility] LAB;  Service: Cardiovascular;;   Lower extremity arterial Dopplers  06/11/2013   No occlusive disease   LUMBAR LAMINECTOMY/DECOMPRESSION MICRODISCECTOMY Right 10/07/2014   Procedure: LUMBAR  LAMINECTOMY/DECOMPRESSION MICRODISCECTOMY 1 LEVEL;  Surgeon: Kary Kos, MD;  Location: [facility]URO ORS;  Service: Neurosurgery;  Laterality: Right;  Right L3-L4 Microdiscectomy   NM MYOVIEW LTD  06/03/2013   Low risk study with no ischemia. EF roughly 44% with no regional wall motion abnormalities noted   PFTs  06/16/2013   Normal Volumes &  Spirometry; Moderately reduced DLCO   POLYPECTOMY     SHOULDER HEMI-ARTHROPLASTY Right 05/28/2015   Procedure: RIGHT SHOULDER HEMI-ARTHROPLASTY CTA HEAD AND SUBSCAP REPAIR ;  Surgeon: Netta Cedars, MD;  Location: [facility];  Service: Orthopedics;  Laterality: Right;   TRANSTHORACIC ECHOCARDIOGRAM  06/11/2013   Mildly reduced EF: 45-50%. Mild anterior hypokinesis with incoordinate septal motion. No evidence of pulmonary hypertension    Prior to Admission medications   Medication Sig Start Date End Date Taking? Authorizing Provider  aspirin EC 81 MG tablet Take 81 mg by mouth daily.   Yes [provider]  lisinopril (ZESTRIL) 10 MG tablet Take 1 tablet (10 mg total) by mouth daily. 03/28/21  Yes [name], DO  rosuvastatin (CRESTOR) 20 MG tablet Take 1 tablet (20 mg total) by mouth daily. 03/28/21  Yes [name], DO  sertraline (ZOLOFT) 100 MG tablet Take 1 tablet (100 mg total) by mouth daily. Take for a total of 150 mg/d. 08/16/21  Yes Wendling, Crosby Oyster, DO  sertraline (ZOLOFT) 50 MG tablet Take 1 tablet (50 mg total) by mouth daily. Take for a total of 150 mg daily. 08/16/21  Yes  [name], DO  acetaminophen (TYLENOL) 500 MG tablet Take 1,000 mg by mouth every 6 (six) hours as needed for mild pain, fever or headache.    [provider]  nitroGLYCERIN (NITROSTAT) 0.4 MG SL tablet Place 1 tablet (0.4 mg total) under the tongue every 5 (five) minutes as needed for chest pain. Patient not taking: Reported on 02/20/2022 02/08/21   [name], DO  triamcinolone cream (KENALOG) 0.1 % Apply 1  application topically 3 (three) times daily as needed (psoriasis on hands). 08/16/21   [name], DO    Current Outpatient Medications  Medication Sig Dispense Refill   aspirin EC 81 MG tablet Take 81 mg by mouth daily.     lisinopril (ZESTRIL) 10 MG tablet Take 1 tablet (10 mg total) by mouth daily. 90 tablet 3   rosuvastatin (CRESTOR) 20 MG tablet Take 1 tablet (20 mg total) by mouth daily. 90 tablet 3   sertraline (ZOLOFT) 100 MG tablet Take 1 tablet (100 mg total) by mouth daily. Take for a total of 150 mg/d. 90 tablet 2   sertraline (ZOLOFT) 50 MG tablet Take 1 tablet (50 mg total) by mouth daily. Take for a total of 150 mg daily. 90 tablet 2   acetaminophen (TYLENOL) 500 MG tablet Take 1,000 mg by mouth every 6 (six) hours as needed for mild pain, fever or headache.     nitroGLYCERIN (NITROSTAT) 0.4 MG SL tablet Place 1 tablet (0.4 mg total) under the tongue every 5 (five) minutes as needed for chest pain. (Patient not taking: Reported on 02/20/2022) 30 tablet 1   triamcinolone cream (KENALOG) 0.1 % Apply 1 application topically 3 (three) times daily as needed (psoriasis on hands). 454 g 2   Current Facility-Administered Medications  Medication Dose Route Frequency Provider Last Rate Last Admin   0.9 %  sodium chloride infusion  500 mL Intravenous Continuous Jeslyn Amsler, Lajuan Lines, MD        Allergies as of 03/06/2022 - Review Complete 03/06/2022  Allergen Reaction Noted   Isosorbide nitrate Other (See Comments) 07/28/2013   Oxycodone Itching 09/25/2014   Penicillins Hives and Rash 05/14/2013    Family History  Problem Relation Age of Onset   Atrial fibrillation Mother        Alive at 68   COPD Mother    Hypertension Mother    Colonic polyp Mother    Hypertension Father        Alive at 2   Lung cancer Father        Chronic smoker   Factor V Leiden deficiency Father        Also Lupus anticoagulant   Heart attack Maternal Grandmother 48   Heart failure Paternal  Grandmother    Diabetes Paternal Grandmother    Heart attack Brother 27       X2   Heart attack Sister 23       Deceased   Healthy Sister        x2   Healthy Son        x2   Colon cancer Neg Hx    Esophageal cancer Neg Hx    Rectal cancer Neg Hx    Stomach cancer Neg Hx     Social History   Socioeconomic History   Marital status: Married    Spouse name: Not on file   Number of children: Not on file   Years of education: Not on file   Highest education level: Not  on file  Occupational History   Not on file  Tobacco Use   Smoking status: Every Day    Packs/day: 1.00    Years: 42.00    Total pack years: 42.00    Types: Cigarettes   Smokeless tobacco: Never  Vaping Use   Vaping Use: Every day   Substances: Flavoring  Substance and Sexual Activity   Alcohol use: Not Currently    Comment: rarely   Drug use: No   Sexual activity: Yes    Partners: Female  Other Topics Concern   Not on file  Social History Narrative   Married father of 2 boys. Lives with wife, [name], and one son.   He works as a Designer, television/film set for [employer]   He currently smokes one pack a day, cut down from 2 packs per day.   Only occasional beer and mixed drinks.   Does not exercise because he is too tired.   Social Determinants of Health   Financial Resource Strain: Low Risk  (06/28/2021)   Overall Financial Resource Strain (CARDIA)    Difficulty of Paying Living Expenses: Not hard at all  Food Insecurity: No Food Insecurity (06/28/2021)   Hunger Vital Sign    Worried About Running Out of Food in the Last Year: Never true    Ran Out of Food in the Last Year: Never true  Transportation Needs: No Transportation Needs (06/28/2021)   PRAPARE - Hydrologist (Medical): No    Lack of Transportation (Non-Medical): No  Physical Activity: Inactive (06/28/2021)   Exercise Vital Sign    Days of Exercise per Week: 0 days     Minutes of Exercise per Session: 0 min  Stress: No Stress Concern Present (06/28/2021)   [place]    Feeling of Stress : Not at all  Social Connections: Moderately Isolated (06/28/2021)   Social Connection and Isolation Panel [NHANES]    Frequency of Communication with Friends and Family: More than three times a week    Frequency of Social Gatherings with Friends and Family: More than three times a week    Attends Religious Services: Never    Marine scientist or Organizations: No    Attends Archivist Meetings: Never    Marital Status: Married  Human resources officer Violence: Not At Risk (06/28/2021)   Humiliation, Afraid, Rape, and Kick questionnaire    Fear of Current or Ex-Partner: No    Emotionally Abused: No    Physically Abused: No    Sexually Abused: No    Physical Exam: Vital signs in last 24 hours: '@BP'$  104/65   Pulse 64   Temp (!) 97.3 F (36.3 C) (Temporal)   Ht '5\' 11"'$  (1.803 m)   Wt 212 lb (96.2 kg)   SpO2 97%   BMI 29.57 kg/m  GEN: NAD EYE: Sclerae anicteric ENT: MMM CV: Non-tachycardic Pulm: CTA b/l GI: Soft, NT/ND NEURO:  Alert & Oriented x 3   Zenovia Jarred, MD [facility]  03/06/2022 11:34 AM

## 2022-03-06 NOTE — Progress Notes (Signed)
Report to PACU, RN, vss, BBS= Clear.  

## 2022-03-06 NOTE — Patient Instructions (Signed)
   Handouts on polyps and hemorrhoids given.   YOU HAD AN ENDOSCOPIC PROCEDURE TODAY AT [facility]:   Refer to the procedure report that was given to you for any specific questions about what was found during the examination.  If the procedure report does not answer your questions, please call your gastroenterologist to clarify.  If you requested that your care partner not be given the details of your procedure findings, then the procedure report has been included in a sealed envelope for you to review at your convenience later.  YOU SHOULD EXPECT: Some feelings of bloating in the abdomen. Passage of more gas than usual.  Walking can help get rid of the air that was put into your GI tract during the procedure and reduce the bloating. If you had a lower endoscopy (such as a colonoscopy or flexible sigmoidoscopy) you may notice spotting of blood in your stool or on the toilet paper. If you underwent a bowel prep for your procedure, you may not have a normal bowel movement for a few days.  Please Note:  You might notice some irritation and congestion in your nose or some drainage.  This is from the oxygen used during your procedure.  There is no need for concern and it should clear up in a day or so.  SYMPTOMS TO REPORT IMMEDIATELY:  Following lower endoscopy (colonoscopy or flexible sigmoidoscopy):  Excessive amounts of blood in the stool  Significant tenderness or worsening of abdominal pains  Swelling of the abdomen that is new, acute  Fever of 100F or higher  For urgent or emergent issues, a gastroenterologist can be reached at any hour by calling [phone]. Do not use MyChart messaging for urgent concerns.    DIET:  We do recommend a small meal at first, but then you may proceed to your regular diet.  Drink plenty of fluids but you should avoid alcoholic beverages for 24 hours.  ACTIVITY:  You should plan to take it easy for the rest of today and you should NOT DRIVE  or use heavy machinery until tomorrow (because of the sedation medicines used during the test).    FOLLOW UP: Our staff will call the number listed on your records the next business day following your procedure.  We will call around 7:15- 8:00 am to check on you and address any questions or concerns that you may have regarding the information given to you following your procedure. If we do not reach you, we will leave a message.  If you develop any symptoms (ie: fever, flu-like symptoms, shortness of breath, cough etc.) before then, please call [phone].  If you test positive for Covid 19 in the 2 weeks post procedure, please call and report this information to [place].    If any biopsies were taken you will be contacted by phone or by letter within the next 1-3 weeks.  Please call us at [phone] if you have not heard about the biopsies in 3 weeks.    SIGNATURES/CONFIDENTIALITY: You and/or your care partner have signed paperwork which will be entered into your electronic medical record.  These signatures attest to the fact that that the information above on your After Visit Summary has been reviewed and is understood.  Full responsibility of the confidentiality of this discharge information lies with you and/or your care-partner.

## 2022-03-06 NOTE — Progress Notes (Signed)
Pt's states no medical or surgical changes since previsit or office visit. 

## 2022-03-06 NOTE — Op Note (Signed)
[facility] Patient Name: Charles Grimes Procedure Date: 03/06/2022 11:37 AM MRN: [id number] Endoscopist: Jerene Bears , MD Age: 66 Referring MD:  Date of Birth: September 17, 1955 Gender: Male Account #: [id number] Procedure:                Colonoscopy Indications:              High risk colon cancer surveillance: Personal                            history of multiple adenomas, Last colonoscopy:                            June 2018 (4 adenomas removed); Oct 2015 (> 10                            adenomas removed) Medicines:                Monitored Anesthesia Care Procedure:                Pre-Anesthesia Assessment:                           - Prior to the procedure, a History and Physical                            was performed, and patient medications and                            allergies were reviewed. The patient's tolerance of                            previous anesthesia was also reviewed. The risks                            and benefits of the procedure and the sedation                            options and risks were discussed with the patient.                            All questions were answered, and informed consent                            was obtained. Prior Anticoagulants: The patient has                            taken no previous anticoagulant or antiplatelet                            agents. ASA Grade Assessment: II - A patient with                            mild systemic disease. After reviewing the risks  and benefits, the patient was deemed in                            satisfactory condition to undergo the procedure.                           After obtaining informed consent, the colonoscope                            was passed under direct vision. Throughout the                            procedure, the patient's blood pressure, pulse, and                            oxygen saturations were monitored continuously. The                             Olympus CF-HQ190L ([id number]) Colonoscope was                            introduced through the anus and advanced to the                            cecum, identified by appendiceal orifice and                            ileocecal valve. The colonoscopy was performed                            without difficulty. The patient tolerated the                            procedure well. The quality of the bowel                            preparation was good. The ileocecal valve,                            appendiceal orifice, and rectum were photographed. Scope In: 11:39:50 AM Scope Out: 12:01:39 PM Scope Withdrawal Time: 0 hours 17 minutes 30 seconds  Total Procedure Duration: 0 hours 21 minutes 49 seconds  Findings:                 The digital rectal exam was normal.                           Two sessile polyps were found in the ascending                            colon. The polyps were 3 to 5 mm in size. These                            polyps were removed with a cold snare. Resection  and retrieval were complete.                           Two sessile polyps were found in the hepatic                            flexure. The polyps were 3 to 4 mm in size. These                            polyps were removed with a cold snare. Resection                            and retrieval were complete.                           Four sessile polyps were found in the transverse                            colon. The polyps were 3 to 5 mm in size. These                            polyps were removed with a cold snare. Resection                            and retrieval were complete.                           Two sessile polyps were found in the descending                            colon. The polyps were 4 to 5 mm in size. These                            polyps were removed with a cold snare. Resection                            and retrieval were complete.                            A 6 mm polyp was found in the sigmoid colon. The                            polyp was sessile. The polyp was removed with a                            cold snare. Resection and retrieval were complete.                           Internal hemorrhoids were found during                            retroflexion. The hemorrhoids were medium-sized. Complications:  No immediate complications. Estimated Blood Loss:     Estimated blood loss was minimal. Impression:               - Two 3 to 5 mm polyps in the ascending colon,                            removed with a cold snare. Resected and retrieved.                           - Two 3 to 4 mm polyps at the hepatic flexure,                            removed with a cold snare. Resected and retrieved.                           - Four 3 to 5 mm polyps in the transverse colon,                            removed with a cold snare. Resected and retrieved.                           - Two 4 to 5 mm polyps in the descending colon,                            removed with a cold snare. Resected and retrieved.                           - One 6 mm polyp in the sigmoid colon, removed with                            a cold snare. Resected and retrieved.                           - Internal hemorrhoids. Recommendation:           - Patient has a contact number available for                            emergencies. The signs and symptoms of potential                            delayed complications were discussed with the                            patient. Return to normal activities tomorrow.                            Written discharge instructions were provided to the                            patient.                           - Resume  previous diet.                           - Continue present medications.                           - Await pathology results.                           - Repeat colonoscopy is recommended for                             surveillance. The colonoscopy date will be                            determined after pathology results from today's                            exam become available for review. Jerene Bears, MD 03/06/2022 12:18:32 PM This report has been signed electronically.

## 2022-03-07 ENCOUNTER — Telehealth: Payer: Self-pay

## 2022-03-07 NOTE — Telephone Encounter (Signed)
Left message

## 2022-03-09 ENCOUNTER — Encounter: Payer: Self-pay | Admitting: Internal Medicine

## 2022-03-30 ENCOUNTER — Encounter: Payer: Self-pay | Admitting: Family Medicine

## 2022-03-31 ENCOUNTER — Ambulatory Visit (INDEPENDENT_AMBULATORY_CARE_PROVIDER_SITE_OTHER): Payer: 59 | Admitting: Family Medicine

## 2022-03-31 ENCOUNTER — Encounter: Payer: Self-pay | Admitting: Family Medicine

## 2022-03-31 VITALS — BP 120/76 | HR 62 | Temp 97.3°F | Ht 71.0 in | Wt 209.0 lb

## 2022-03-31 DIAGNOSIS — B880 Other acariasis: Secondary | ICD-10-CM

## 2022-03-31 MED ORDER — ROSUVASTATIN CALCIUM 20 MG PO TABS: 20.0000 mg | ORAL_TABLET | Freq: Every day | ORAL | 3 refills | Status: DC

## 2022-03-31 MED ORDER — TRIAMCINOLONE ACETONIDE 0.1 % EX CREA: 1.0000 | TOPICAL_CREAM | Freq: Two times a day (BID) | CUTANEOUS | 0 refills | Status: DC

## 2022-03-31 MED ORDER — PREDNISONE 20 MG PO TABS: 40.0000 mg | ORAL_TABLET | Freq: Every day | ORAL | 0 refills | Status: AC

## 2022-03-31 MED ORDER — LISINOPRIL 10 MG PO TABS: 10.0000 mg | ORAL_TABLET | Freq: Every day | ORAL | 3 refills | Status: DC

## 2022-03-31 NOTE — Patient Instructions (Signed)
Try not to scratch as this can make things worse. Avoid scented products while dealing with this. You may resume when the itchiness resolves. Cold/cool compresses can help.   Claritin (loratadine), Allegra (fexofenadine), Zyrtec (cetirizine) which is also equivalent to Xyzal (levocetirizine); these are listed in order from weakest to strongest. Generic, and therefore cheaper, options are in the parentheses.   There are available OTC, and the generic versions, which may be cheaper, are in parentheses. Show this to a pharmacist if you have trouble finding any of these items.  Use the cream after the steroid is finished.   Let us know if you need anything.

## 2022-03-31 NOTE — Progress Notes (Signed)
Chief Complaint  Patient presents with   Insect Bite    Chiggers on Tuesday     Charles Grimes is a 66 y.o. male here for a skin complaint.  Duration: 3 days Location: torso, legs Pruritic? Yes Painful? No Drainage? No Bit by chiggers Other associated symptoms: no fevers Therapies tried thus far: Calamine lotion, Benadryl  Past Medical History:  Diagnosis Date   Anginal pain (HCC)    Anxiety    Arthritis    Coronary artery disease, non-occlusive 08/2013   Mild to moderate (40% OM1) single vessel CAD. Otherwise nonobstructed.   Depression    Dyslipidemia, goal LDL below 100    Essential hypertension    Family history of premature CAD    GERD (gastroesophageal reflux disease)    H/O skin disorder    Involving hands. Subsequently resolved.    Nonischemic cardiomyopathy ([place]) 05/2013   Non-ischemic: EF ~45% by Echo & Myoview --> Non-obstructive CAD   Obesity (BMI 30.0-34.9)    OSA (obstructive sleep apnea) 06/21/2017   uses CPAP   Sleep apnea     BP 120/76   Pulse 62   Temp (!) 97.3 F (36.3 C) (Oral)   Ht '5\' 11"'$  (1.803 m)   Wt 209 lb (94.8 kg)   SpO2 94%   BMI 29.15 kg/m  Gen: awake, alert, appearing stated age Lungs: No accessory muscle use Skin: Scattered papules over bilateral lower extremities, right thigh, and right lower back region.  There are some areas of excoriation on the thigh.  No drainage, erythema, TTP, fluctuance Psych: Age appropriate judgment and insight  Chigger bites - Plan: predniSONE (DELTASONE) 20 MG tablet, triamcinolone cream (KENALOG) 0.1 %  5-day prednisone burst 40 mg daily.  After that, he will use triamcinolone twice daily.  Avoid scented products and try not to scratch.  An second-generation oral antihistamine was recommended, continue with unscented emollients. F/u prn. The patient voiced understanding and agreement to the plan.  [place], DO 03/31/22 3:28 PM

## 2022-06-22 IMAGING — DX DG HIP (WITH OR WITHOUT PELVIS) 2-3V*L*
3 series · 3 of 3 positions shown · non-contrast
Comparison: CT 01/22/2020.

CLINICAL DATA: Left hip pain.

EXAM:
DG HIP (WITH OR WITHOUT PELVIS) 2-3V LEFT

[pelvis ap]
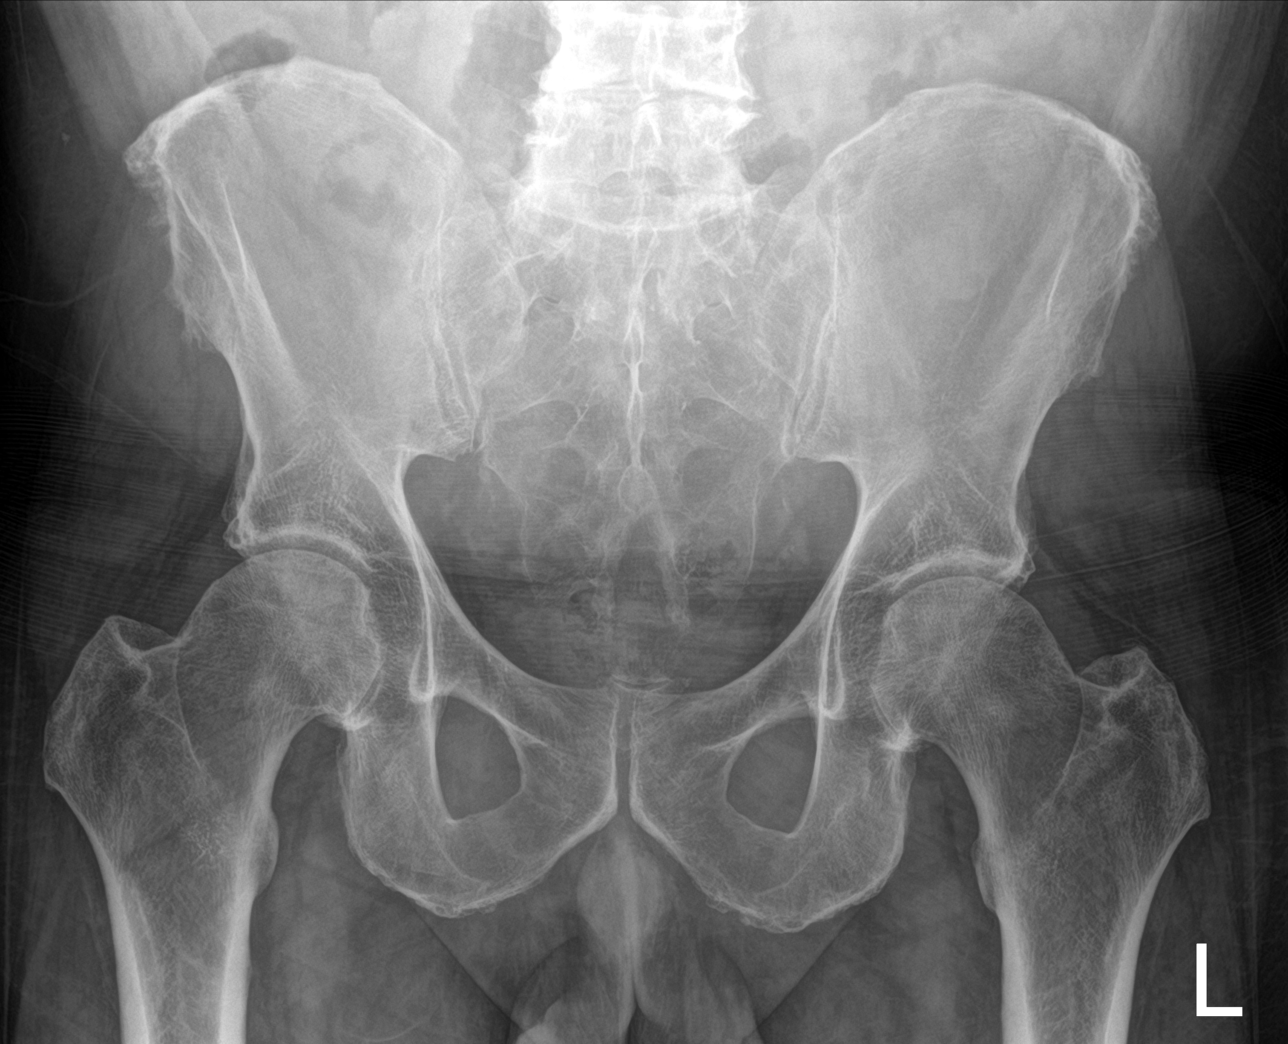

[hip ap]
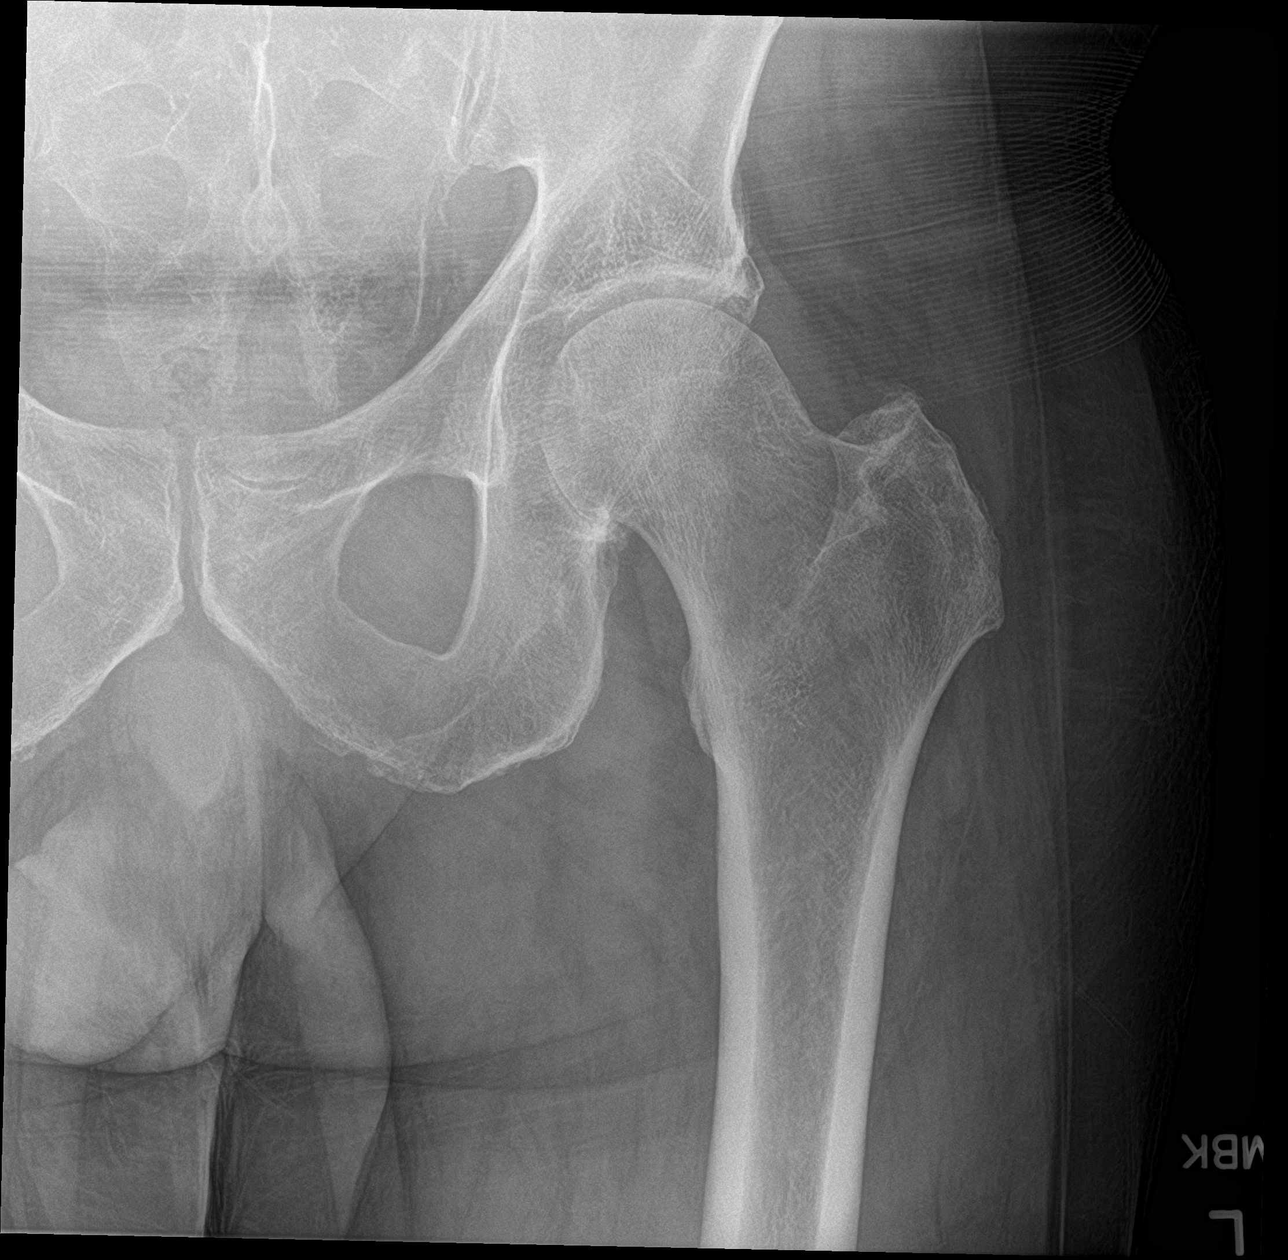

[hip lat]
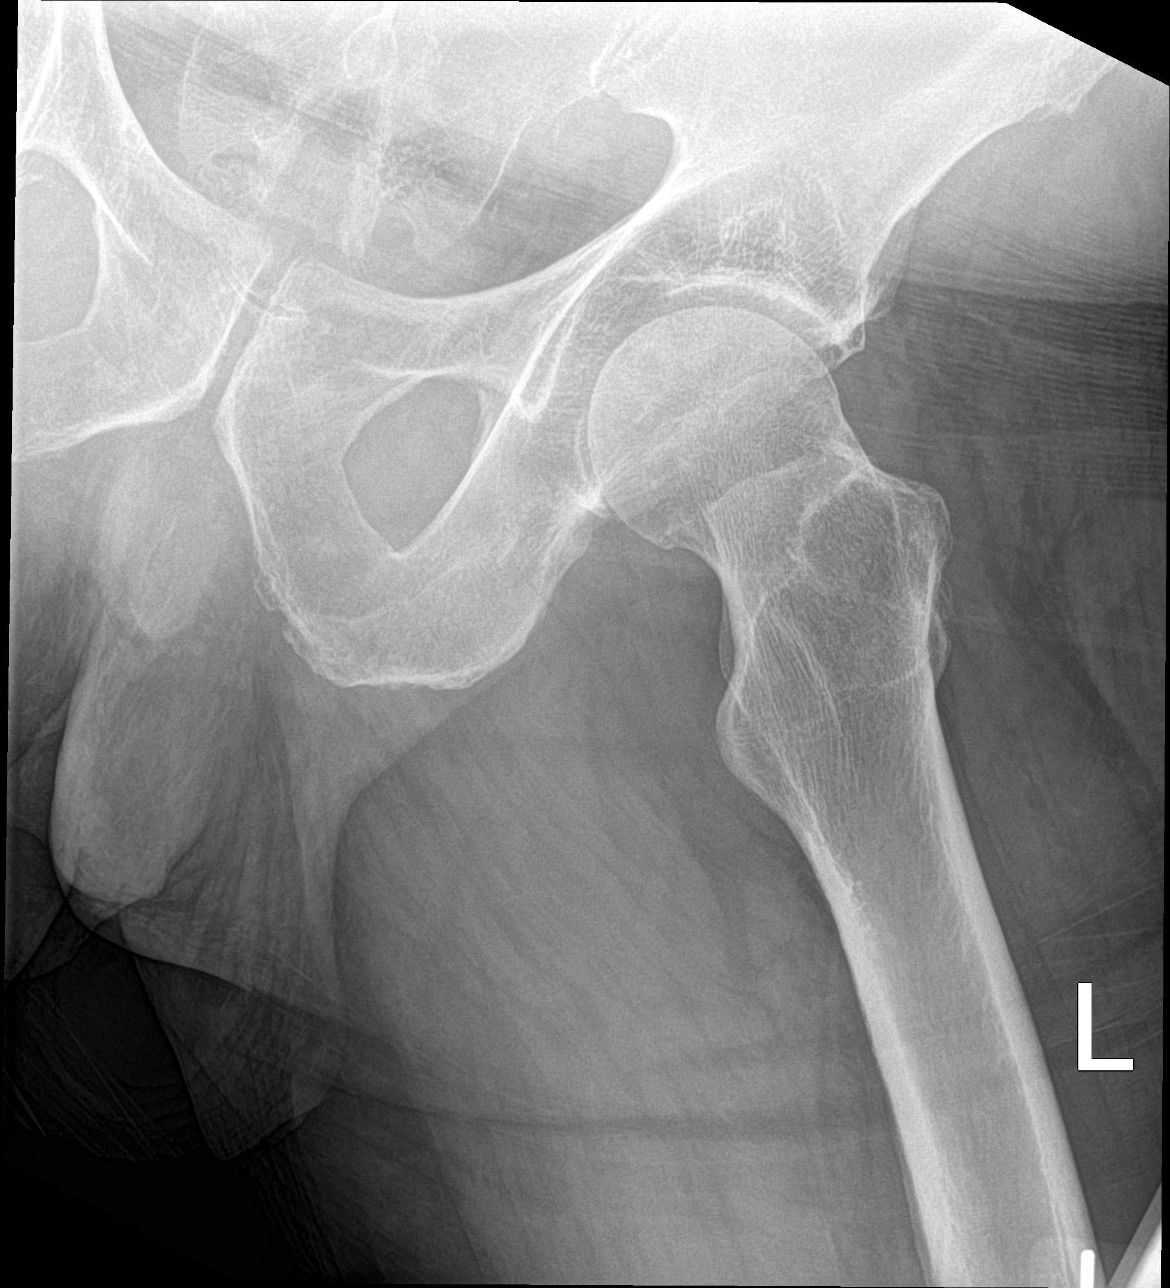

[3 of 3 positions shown; findings below may reference images not displayed]

FINDINGS: Stable chronic L4 compression fracture. Degenerative changes lumbar
spine and both hips. No acute bony or joint abnormality identified.
No evidence of hip fracture or dislocation. Left hip is intact.
IMPRESSION: 1.  Stable chronic L4 compression fracture.

2. Degenerative changes lumbar spine and both hips. No abnormality
identified. Left hip is intact.

## 2022-06-28 ENCOUNTER — Ambulatory Visit: Payer: 59 | Admitting: Family Medicine

## 2022-07-18 ENCOUNTER — Encounter: Payer: Self-pay | Admitting: Family Medicine

## 2022-07-18 ENCOUNTER — Ambulatory Visit (INDEPENDENT_AMBULATORY_CARE_PROVIDER_SITE_OTHER): Payer: Medicare Other | Admitting: Family Medicine

## 2022-07-18 VITALS — BP 108/64 | HR 81 | Temp 97.9°F | Ht 71.0 in | Wt 199.5 lb

## 2022-07-18 DIAGNOSIS — Z634 Disappearance and death of family member: Secondary | ICD-10-CM

## 2022-07-18 MED ORDER — ALPRAZOLAM 0.5 MG PO TABS: 0.2500 mg | ORAL_TABLET | Freq: Two times a day (BID) | ORAL | 1 refills | Status: DC | PRN

## 2022-07-18 NOTE — Patient Instructions (Addendum)
Do not drink alcohol, do any illicit/street drugs, drive or do anything that requires alertness while on this medicine.   Aim to do some physical exertion for 150 minutes per week. This is typically divided into 5 days per week, 30 minutes per day. The activity should be enough to get your heart rate up. Anything is better than nothing if you have time constraints.  Please consider counseling. Contact [phone] to schedule an appointment or inquire about cost/insurance coverage.  [facility] located at [place], [place], [place].  Phone number = [phone].  Dr. Lennice Sites - Adult Psychiatry.    [facility] located at [place], [place], [place]. Phone number = [phone].   [facility] located at [address], [place], [place].  Phone number = [phone].   [facility] located at [place], [place], [place].  Phone number = [phone].  Coping skills Choose 5 that work for you: Take a deep breath Count to 20 Read a book Do a puzzle Meditate Bake Sing Knit Garden Pray Go outside Call a friend Listen to music Take a walk Color Send a note Take a bath Watch a movie Be alone in a quiet place Pet an animal Visit a friend Journal Exercise Stretch   Let us know if you need anything.

## 2022-07-18 NOTE — Progress Notes (Signed)
Chief Complaint  Patient presents with   Follow-up    Wife passed away 2022/07/09.    Subjective: Patient is a 67 y.o. male here for bereavement.  He is here with his youngest son.  The patient's wife unexpectedly passed away in 07/09/2022.  Since that time, he has been very sad and anxious.  A friend gave him some Xanax which helped quite well.  He is wondering if he could have his own prescription.  He is not interested in seeing a therapist or bereavement counselor.  He feels better when he goes outside or stays active.  He no homicidal or suicidal ideation.  No self-medication.  Past Medical History:  Diagnosis Date   Anginal pain ([place])    Anxiety    Arthritis    Coronary artery disease, non-occlusive 08/2013   Mild to moderate (40% OM1) single vessel CAD. Otherwise nonobstructed.   Depression    Dyslipidemia, goal LDL below 100    Essential hypertension    Family history of premature CAD    GERD (gastroesophageal reflux disease)    H/O skin disorder    Involving hands. Subsequently resolved.    Nonischemic cardiomyopathy ([place]) 05/2013   Non-ischemic: EF ~45% by Echo & Myoview --> Non-obstructive CAD   Obesity (BMI 30.0-34.9)    OSA (obstructive sleep apnea) 06/21/2017   uses CPAP   Sleep apnea     Objective: BP 108/64 (BP Location: Left Arm, Patient Position: Sitting, Cuff Size: Normal)   Pulse 81   Temp 97.9 F (36.6 C) (Oral)   Ht '5\' 11"'$  (1.803 m)   Wt 199 lb 8 oz (90.5 kg)   SpO2 93%   BMI 27.82 kg/m  General: Awake, appears stated age Lungs: No accessory muscle use Psych: Age appropriate judgment and insight, normal affect and mood-he did become tearful during exam when discussing his wife.  Assessment and Plan: Bereavement - Plan: ALPRAZolam (XANAX) 0.5 MG tablet  Counseling information provided in this paperwork.  Anxiety coping techniques also provided.  Counseled on exercise and getting outside.  Will send in Xanax to take 0.25-0.5 mg daily as needed.  If  he finds he is taking this too much, he will let me know and we will discuss a daily medication.  Follow-up in around a month. The patient and his son voiced understanding and agreement to the plan.  [place], DO 07/18/22  4:35 PM

## 2022-09-15 ENCOUNTER — Other Ambulatory Visit: Payer: Self-pay | Admitting: Family Medicine

## 2022-09-18 ENCOUNTER — Telehealth: Payer: Self-pay | Admitting: Family Medicine

## 2022-09-18 NOTE — Telephone Encounter (Signed)
Copied from [place] [id number]. Topic: Medicare AWV >> Sep 18, 2022  9:48 AM Devoria Glassing wrote: Reason for CRM: Called patient to schedule Medicare Annual Wellness Visit (AWV). Left message for patient to call back and schedule Medicare Annual Wellness Visit (AWV).  Last date of AWV: 06/28/2021   Please schedule an appointment at any time with [facility].  If any questions, please contact me.  Thank you ,  Sherol Dade; [place] Direct Dial: [phone]

## 2022-09-26 ENCOUNTER — Ambulatory Visit (INDEPENDENT_AMBULATORY_CARE_PROVIDER_SITE_OTHER): Payer: Medicare HMO | Admitting: Family Medicine

## 2022-09-26 ENCOUNTER — Encounter: Payer: Self-pay | Admitting: Family Medicine

## 2022-09-26 VITALS — BP 112/76 | HR 70 | Temp 98.3°F | Ht 71.0 in | Wt 192.0 lb

## 2022-09-26 DIAGNOSIS — L729 Follicular cyst of the skin and subcutaneous tissue, unspecified: Secondary | ICD-10-CM

## 2022-09-26 DIAGNOSIS — F17200 Nicotine dependence, unspecified, uncomplicated: Secondary | ICD-10-CM | POA: Diagnosis not present

## 2022-09-26 NOTE — Progress Notes (Signed)
Chief Complaint  Patient presents with   check spot on back    Charles Grimes is a 67 y.o. male here for a skin complaint.  Duration: a few weeks Location: back Pruritic? No Painful? Yes Drainage? No New soaps/lotions/topicals/detergents? No Sick contacts? No Other associated symptoms: Not improving, doesn't seem to be growing, no fevers Therapies tried thus far: none  Past Medical History:  Diagnosis Date   Anginal pain ([place])    Anxiety    Arthritis    Coronary artery disease, non-occlusive 08/2013   Mild to moderate (40% OM1) single vessel CAD. Otherwise nonobstructed.   Depression    Dyslipidemia, goal LDL below 100    Essential hypertension    Family history of premature CAD    GERD (gastroesophageal reflux disease)    H/O skin disorder    Involving hands. Subsequently resolved.    Nonischemic cardiomyopathy ([place]) 05/2013   Non-ischemic: EF ~45% by Echo & Myoview --> Non-obstructive CAD   Obesity (BMI 30.0-34.9)    OSA (obstructive sleep apnea) 06/21/2017   uses CPAP   Sleep apnea     BP 112/76 (BP Location: Right Arm, Patient Position: Sitting, Cuff Size: Normal)   Pulse 70   Temp 98.3 F (36.8 C) (Oral)   Ht '5\' 11"'$  (1.803 m)   Wt 192 lb (87.1 kg)   SpO2 96%   BMI 26.78 kg/m  Gen: awake, alert, appearing stated age Lungs: No accessory muscle use Skin: 1.8 x 1.2 cm raised and slightly erythematous lesion on R upper back around T 6. Mild ttp. No drainage, fluctuance, excoriation Psych: Age appropriate judgment and insight  Procedure note; excision of cyst Informed consent was obtained. The area was cleaned with alcohol and injected with 4 mL of 1% lidocaine with epinephrine. The area was then cleaned with betadine. A 15 blade scalpel was then used to make an elliptical track. The overlying skin was removed.  The capsule was perforated and sebum was expressed. The capsule was further dissected down and removed in its entirety. This was made through the  dermis to the subQ fat and hypodermis layer.  3 Simple 4-0 nylon sutures were placed with good approximation of wound edges. The area was dressed with triple antibiotic ointment and a bandage. There were no complications noted. The patient tolerated the procedure well.  Cyst of skin - Plan: PR EXC B9 LESION MRGN XCP SK TG T/A/L 1.1-2.0 CM  Smoker - Plan: Ambulatory Referral Lung [facility] Scre[facility] today.  Aftercare instructions verbalized and written down.  Warning signs and symptoms verbalized and written down.  Follow-up in 1 week for suture removal. The patient voiced understanding and agreement to the plan.  [place], DO 09/26/22 5:03 PM

## 2022-09-26 NOTE — Patient Instructions (Signed)
Do not shower for the rest of the day. When you do wash it, use only soap and water. Do not vigorously scrub. Apply triple antibiotic ointment (like Neosporin) twice daily. Keep the area clean and dry.   Things to look out for: increasing pain not relieved by ibuprofen/acetaminophen, fevers, spreading redness, drainage of pus, or foul odor.  Let us know if you need anything. 

## 2022-09-28 ENCOUNTER — Ambulatory Visit (INDEPENDENT_AMBULATORY_CARE_PROVIDER_SITE_OTHER): Payer: Medicare HMO | Admitting: *Deleted

## 2022-09-28 DIAGNOSIS — Z0000 Encounter for general adult medical examination without abnormal findings: Secondary | ICD-10-CM

## 2022-09-28 NOTE — Progress Notes (Signed)
Subjective:   Charles Grimes is a 67 y.o. male who presents for Medicare Annual/Subsequent preventive examination.  I connected with  Charles Grimes on 09/28/22 by a audio enabled telemedicine application and verified that I am speaking with the correct person using two identifiers.  Patient Location: Home  Provider Location: Office/Clinic  I discussed the limitations of evaluation and management by telemedicine. The patient expressed understanding and agreed to proceed.   Review of Systems     Cardiac Risk Factors include: advanced age (>45mn, >>41women);male gender;dyslipidemia;hypertension     Objective:    There were no vitals filed for this visit. There is no height or weight on file to calculate BMI.     09/28/2022    9:41 AM 06/28/2021    3:45 PM 03/17/2021    4:58 PM 01/22/2020   11:42 AM 04/12/2017    9:13 PM 01/02/2017    8:27 AM 05/21/2015    2:34 PM  Advanced Directives  Does Patient Have a Medical Advance Directive? Yes No No No No No No  Type of AParamedicof [place] will        Does patient want to make changes to medical advance directive? No - Patient declined        Copy of [place]? No - copy requested        Would patient like information on creating a medical advance directive?  Yes (MAU/Ambulatory/Procedural Areas - Information given) No - Patient declined  No - Patient declined  No - patient declined information    Current Medications (verified) Outpatient Encounter Medications as of 09/28/2022  Medication Sig   acetaminophen (TYLENOL) 500 MG tablet Take 1,000 mg by mouth every 6 (six) hours as needed for mild pain, fever or headache.   ALPRAZolam (XANAX) 0.5 MG tablet Take 0.5-1 tablets (0.25-0.5 mg total) by mouth 2 (two) times daily as needed for anxiety.   aspirin EC 81 MG tablet Take 81 mg by mouth daily.   lisinopril (ZESTRIL) 10 MG tablet Take 1 tablet (10 mg total) by mouth daily.    nitroGLYCERIN (NITROSTAT) 0.4 MG SL tablet Place 1 tablet (0.4 mg total) under the tongue every 5 (five) minutes as needed for chest pain.   rosuvastatin (CRESTOR) 20 MG tablet Take 1 tablet (20 mg total) by mouth daily.   sertraline (ZOLOFT) 100 MG tablet Take 1 tablet by mouth once daily   sertraline (ZOLOFT) 50 MG tablet Take 1 tablet (50 mg total) by mouth daily. Take for a total of 150 mg daily.   triamcinolone cream (KENALOG) 0.1 % Apply 1 Application topically 2 (two) times daily.   No facility-administered encounter medications on file as of 09/28/2022.    Allergies (verified) Isosorbide nitrate, Oxycodone, and Penicillins   History: Past Medical History:  Diagnosis Date   Anginal pain ([place]    Anxiety    Arthritis    Coronary artery disease, non-occlusive 08/2013   Mild to moderate (40% OM1) single vessel CAD. Otherwise nonobstructed.   Depression    Dyslipidemia, goal LDL below 100    Essential hypertension    Family history of premature CAD    GERD (gastroesophageal reflux disease)    H/O skin disorder    Involving hands. Subsequently resolved.    Nonischemic cardiomyopathy ([place] 05/2013   Non-ischemic: EF ~45% by Echo & Myoview --> Non-obstructive CAD   Obesity (BMI 30.0-34.9)    OSA (obstructive sleep apnea) 06/21/2017  uses CPAP   Sleep apnea    Past Surgical History:  Procedure Laterality Date   COLONOSCOPY  2015   COLONOSCOPY WITH PROPOFOL  01/02/2017   Dr.Pyrtle   LEFT HEART CATHETERIZATION WITH CORONARY ANGIOGRAM  08/26/2013   Mild to moderate disease with 40-50% stenosis and OM1. Otherwise no significant CAD --  Surgeon: Leonie Man, MD;  Location: [facility] CATH LAB;  Service: Cardiovascular;;   Lower extremity arterial Dopplers  06/11/2013   No occlusive disease   LUMBAR LAMINECTOMY/DECOMPRESSION MICRODISCECTOMY Right 10/07/2014   Procedure: LUMBAR LAMINECTOMY/DECOMPRESSION MICRODISCECTOMY 1 LEVEL;  Surgeon: Kary Kos, MD;  Location: [facility] NEURO ORS;   Service: Neurosurgery;  Laterality: Right;  Right L3-L4 Microdiscectomy   NM MYOVIEW LTD  06/03/2013   Low risk study with no ischemia. EF roughly 44% with no regional wall motion abnormalities noted   PFTs  06/16/2013   Normal Volumes &  Spirometry; Moderately reduced DLCO   POLYPECTOMY     SHOULDER HEMI-ARTHROPLASTY Right 05/28/2015   Procedure: RIGHT SHOULDER HEMI-ARTHROPLASTY CTA HEAD AND SUBSCAP REPAIR ;  Surgeon: Netta Cedars, MD;  Location: [facility];  Service: Orthopedics;  Laterality: Right;   TRANSTHORACIC ECHOCARDIOGRAM  06/11/2013   Mildly reduced EF: 45-50%. Mild anterior hypokinesis with incoordinate septal motion. No evidence of pulmonary hypertension   Family History  Problem Relation Age of Onset   Atrial fibrillation Mother        Alive at 30   COPD Mother    Hypertension Mother    Colonic polyp Mother    Hypertension Father        Alive at 32   Lung cancer Father        Chronic smoker   Factor V Leiden deficiency Father        Also Lupus anticoagulant   Heart attack Maternal Grandmother 93   Heart failure Paternal Grandmother    Diabetes Paternal Grandmother    Heart attack Brother 40       X2   Heart attack Sister 64       Deceased   Healthy Sister        x2   Healthy Son        x2   Colon cancer Neg Hx    Esophageal cancer Neg Hx    Rectal cancer Neg Hx    Stomach cancer Neg Hx    Social History   Socioeconomic History   Marital status: Married    Spouse name: Not on file   Number of children: Not on file   Years of education: Not on file   Highest education level: Not on file  Occupational History   Not on file  Tobacco Use   Smoking status: Every Day    Packs/day: 1.00    Years: 42.00    Additional pack years: 0.00    Total pack years: 42.00    Types: Cigarettes   Smokeless tobacco: Never  Vaping Use   Vaping Use: Every day   Substances: Flavoring  Substance and Sexual Activity   Alcohol use: Not Currently    Comment: rarely   Drug  use: No   Sexual activity: Yes    Partners: Female  Other Topics Concern   Not on file  Social History Narrative   Married father of 2 boys. Lives with wife, [name], and one son.   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   Only occasional beer and mixed drinks.   Does not exercise because he is too tired.   Social Determinants of Health   Financial Resource Strain: Low Risk  (06/28/2021)   Overall Financial Resource Strain (CARDIA)    Difficulty of Paying Living Expenses: Not hard at all  Food Insecurity: No Food Insecurity (09/28/2022)   Hunger Vital Sign    Worried About Running Out of Food in the Last Year: Never true    Ran Out of Food in the Last Year: Never true  Transportation Needs: No Transportation Needs (09/28/2022)   PRAPARE - Hydrologist (Medical): No    Lack of Transportation (Non-Medical): No  Physical Activity: Inactive (06/28/2021)   Exercise Vital Sign    Days of Exercise per Week: 0 days    Minutes of Exercise per Session: 0 min  Stress: No Stress Concern Present (06/28/2021)   [place]    Feeling of Stress : Not at all  Social Connections: Moderately Isolated (06/28/2021)   Social Connection and Isolation Panel [NHANES]    Frequency of Communication with Friends and Family: More than three times a week    Frequency of Social Gatherings with Friends and Family: More than three times a week    Attends Religious Services: Never    Marine scientist or Organizations: No    Attends Music therapist: Never    Marital Status: Married    Tobacco Counseling Ready to quit: Not Answered Counseling given: Not Answered   Clinical Intake:  Pre-visit preparation completed: Yes  Pain : No/denies pain  Diabetes: No  How often do  you need to have someone help you when you read instructions, pamphlets, or other written materials from your doctor or pharmacy?: 1 - Never   Activities of Daily Living    09/28/2022    9:43 AM  In your present state of health, do you have any difficulty performing the following activities:  Hearing? 1  Comment hearing loss  Vision? 0  Difficulty concentrating or making decisions? 0  Walking or climbing stairs? 0  Dressing or bathing? 0  Doing errands, shopping? 0  Preparing Food and eating ? N  Using the Toilet? N  In the past six months, have you accidently leaked urine? N  Do you have problems with loss of bowel control? N  Managing your Medications? N  Managing your Finances? N  Housekeeping or managing your Housekeeping? N    Patient Care Team: [name], DO as PCP - General (Family Medicine)  Indicate any recent Medical Services you may have received from other than [facility] providers in the past year (date may be approximate).     Assessment:   This is a routine wellness examination for Shu.  Hearing/Vision screen No results found.  Dietary issues and exercise activities discussed: Current Exercise Habits: The patient does not participate in regular exercise at present (does all yard work, splits wood), Exercise limited by: None identified   Goals Addressed   None    Depression Screen    09/28/2022    9:43 AM 07/18/2022    2:58 PM 06/28/2021    3:49 PM 02/08/2021    1:24 PM 05/02/2016    4:19 PM 10/25/2015    8:16 AM 03/09/2014    8:09 PM  PHQ 2/9 Scores  PHQ - 2 Score 1 6 0  0 0 0 1  PHQ- 9 Score  12    1     Fall Risk    09/28/2022    9:40 AM 09/26/2022    3:42 PM 06/28/2021    3:47 PM 06/27/2021   11:12 PM 02/08/2021    1:24 PM  [place] in the past year? 0 0 '1 1 1  '$ Number falls in past yr: 0 0 1 1 0  Injury with Fall? 0 0 0 0 0  Risk for fall due to : No Fall Risks  History of fall(s)  No Fall Risks  Follow up Falls  evaluation completed  Falls prevention discussed  Falls evaluation completed    FALL RISK PREVENTION PERTAINING TO THE HOME:  Any stairs in or around the home? No  Home free of loose throw rugs in walkways, pet beds, electrical cords, etc? Yes  Adequate lighting in your home to reduce risk of falls? Yes   ASSISTIVE DEVICES UTILIZED TO PREVENT FALLS:  Life alert? No  Use of a cane, walker or w/c? No  Grab bars in the bathroom? No  Shower chair or bench in shower? No  Elevated toilet seat or a handicapped toilet? No   TIMED UP AND GO:  Was the test performed?  No, audio visit .    Cognitive Function:        09/28/2022    9:47 AM  6CIT Screen  What Year? 0 points  What month? 0 points  What time? 0 points  Count back from 20 4 points  Months in reverse 4 points  Repeat phrase 0 points  Total Score 8 points    Immunizations Immunization History  Administered Date(s) Administered   Fluad Quad(high Dose 65+) 08/16/2021   Influenza,inj,Quad PF,6+ Mos 06/10/2014, 06/20/2017, 04/15/2018, 04/06/2020   PFIZER(Purple Top)SARS-COV-2 Vaccination 04/06/2020, 04/27/2020   PNEUMOCOCCAL CONJUGATE-20 08/16/2021   Tdap 01/01/2014    TDAP status: Up to date  Flu Vaccine status: Declined, Education has been provided regarding the importance of this vaccine but patient still declined. Advised may receive this vaccine at local pharmacy or Health Dept. Aware to provide a copy of the vaccination record if obtained from local pharmacy or Health Dept. Verbalized acceptance and understanding.  Pneumococcal vaccine status: Up to date  Covid-19 vaccine status: Information provided on how to obtain vaccines.   Qualifies for Shingles Vaccine? Yes   Zostavax completed No   Shingrix Completed?: No.    Education has been provided regarding the importance of this vaccine. Patient has been advised to call insurance company to determine out of pocket expense if they have not yet received this  vaccine. Advised may also receive vaccine at local pharmacy or Health Dept. Verbalized acceptance and understanding.  Screening Tests Health Maintenance  Topic Date Due   Zoster Vaccines- Shingrix (1 of 2) Never done   Lung Cancer Screening  03/17/2022   COVID-19 Vaccine (3 - 2023-24 season) 03/17/2022   Medicare Annual Wellness (AWV)  06/28/2022   INFLUENZA VACCINE  10/15/2022 (Originally 02/14/2022)   DTaP/Tdap/Td (2 - Td or Tdap) 01/02/2024   COLONOSCOPY (Pts 45-18yr Insurance coverage will need to be confirmed)  03/06/2024   Pneumonia Vaccine 67 Years old  Completed   Hepatitis C Screening  Completed   HPV VACCINES  Aged Out    Health Maintenance  Health Maintenance Due  Topic Date Due   Zoster Vaccines- Shingrix (1 of 2) Never done   Lung Cancer Screening  03/17/2022  COVID-19 Vaccine (3 - 2023-24 season) 03/17/2022   Medicare Annual Wellness (AWV)  06/28/2022    Colorectal cancer screening: Type of screening: Colonoscopy. Completed 03/06/22. Repeat every 2 years  Lung Cancer Screening: (Low Dose CT Chest recommended if Age 71-80 years, 30 pack-year currently smoking OR have quit w/in 15years.) does qualify.   Lung Cancer Screening Referral: placed 09/26/22  Additional Screening:  Hepatitis C Screening: does qualify; Completed 09/03/17  Vision Screening: Recommended annual ophthalmology exams for early detection of glaucoma and other disorders of the eye. Is the patient up to date with their annual eye exam?  Yes  Who is the provider or what is the name of the office in which the patient attends annual eye exams? [facility] If pt is not established with a provider, would they like to be referred to a provider to establish care? No .   Dental Screening: Recommended annual dental exams for proper oral hygiene  Community Resource Referral / Chronic Care Management: CRR required this visit?  No   CCM required this visit?  No      Plan:     I have personally reviewed  and noted the following in the patient's chart:   Medical and social history Use of alcohol, tobacco or illicit drugs  Current medications and supplements including opioid prescriptions. Patient is not currently taking opioid prescriptions. Functional ability and status Nutritional status Physical activity Advanced directives List of other physicians Hospitalizations, surgeries, and ER visits in previous 12 months Vitals Screenings to include cognitive, depression, and falls Referrals and appointments  In addition, I have reviewed and discussed with patient certain preventive protocols, quality metrics, and best practice recommendations. A written personalized care plan for preventive services as well as general preventive health recommendations were provided to patient.   Due to this being a telephonic visit, the after visit summary with patients personalized plan was offered to patient via mail or my-chart.  Patient would like to access on my-chart.  Beatris Ship, [place]   09/28/2022   Nurse Notes: None

## 2022-09-28 NOTE — Patient Instructions (Signed)
[name] , Thank you for taking time to come for your Medicare Wellness Visit. I appreciate your ongoing commitment to your health goals. Please review the following plan we discussed and let me know if I can assist you in the future.   This is a list of the screening recommended for you and due dates:  Health Maintenance  Topic Date Due   Zoster (Shingles) Vaccine (1 of 2) Never done   Screening for Lung Cancer  03/17/2022   COVID-19 Vaccine (3 - 2023-24 season) 03/17/2022   Flu Shot  10/15/2022*   Medicare Annual Wellness Visit  09/28/2023   DTaP/Tdap/Td vaccine (2 - Td or Tdap) 01/02/2024   Colon Cancer Screening  03/06/2024   Pneumonia Vaccine  Completed   Hepatitis C Screening: USPSTF Recommendation to screen - Ages 18-79 yo.  Completed   HPV Vaccine  Aged Out  *Topic was postponed. The date shown is not the original due date.    Next appointment: Follow up in one year for your annual wellness visit.   Preventive Care 66 Years and Older, Male Preventive care refers to lifestyle choices and visits with your health care provider that can promote health and wellness. What does preventive care include? A yearly physical exam. This is also called an annual well check. Dental exams once or twice a year. Routine eye exams. Ask your health care provider how often you should have your eyes checked. Personal lifestyle choices, including: Daily care of your teeth and gums. Regular physical activity. Eating a healthy diet. Avoiding tobacco and drug use. Limiting alcohol use. Practicing safe sex. Taking low doses of aspirin every day. Taking vitamin and mineral supplements as recommended by your health care provider. What happens during an annual well check? The services and screenings done by your health care provider during your annual well check will depend on your age, overall health, lifestyle risk factors, and family history of disease. Counseling  Your health care provider  may ask you questions about your: Alcohol use. Tobacco use. Drug use. Emotional well-being. Home and relationship well-being. Sexual activity. Eating habits. History of falls. Memory and ability to understand (cognition). Work and work Statistician. Screening  You may have the following tests or measurements: Height, weight, and BMI. Blood pressure. Lipid and cholesterol levels. These may be checked every 5 years, or more frequently if you are over 53 years old. Skin check. Lung cancer screening. You may have this screening every year starting at age 24 if you have a 30-pack-year history of smoking and currently smoke or have quit within the past 15 years. Fecal occult blood test (FOBT) of the stool. You may have this test every year starting at age 76. Flexible sigmoidoscopy or colonoscopy. You may have a sigmoidoscopy every 5 years or a colonoscopy every 10 years starting at age 32. Prostate cancer screening. Recommendations will vary depending on your family history and other risks. Hepatitis C blood test. Hepatitis B blood test. Sexually transmitted disease (STD) testing. Diabetes screening. This is done by checking your blood sugar (glucose) after you have not eaten for a while (fasting). You may have this done every 1-3 years. Abdominal aortic aneurysm (AAA) screening. You may need this if you are a current or former smoker. Osteoporosis. You may be screened starting at age 50 if you are at high risk. Talk with your health care provider about your test results, treatment options, and if necessary, the need for more tests. Vaccines  Your health care provider  may recommend certain vaccines, such as: Influenza vaccine. This is recommended every year. Tetanus, diphtheria, and acellular pertussis (Tdap, Td) vaccine. You may need a Td booster every 10 years. Zoster vaccine. You may need this after age 94. Pneumococcal 13-valent conjugate (PCV13) vaccine. One dose is recommended after  age 34. Pneumococcal polysaccharide (PPSV23) vaccine. One dose is recommended after age 8. Talk to your health care provider about which screenings and vaccines you need and how often you need them. This information is not intended to replace advice given to you by your health care provider. Make sure you discuss any questions you have with your health care provider. Document Released: 07/30/2015 Document Revised: 03/22/2016 Document Reviewed: 05/04/2015 Elsevier Interactive Patient Education  2017 [place] Prevention in the Home Falls can cause injuries. They can happen to people of all ages. There are many things you can do to make your home safe and to help prevent falls. What can I do on the outside of my home? Regularly fix the edges of walkways and driveways and fix any cracks. Remove anything that might make you trip as you walk through a door, such as a raised step or threshold. Trim any bushes or trees on the path to your home. Use bright outdoor lighting. Clear any walking paths of anything that might make someone trip, such as rocks or tools. Regularly check to see if handrails are loose or broken. Make sure that both sides of any steps have handrails. Any raised decks and porches should have guardrails on the edges. Have any leaves, snow, or ice cleared regularly. Use sand or salt on walking paths during winter. Clean up any spills in your garage right away. This includes oil or grease spills. What can I do in the bathroom? Use night lights. Install grab bars by the toilet and in the tub and shower. Do not use towel bars as grab bars. Use non-skid mats or decals in the tub or shower. If you need to sit down in the shower, use a plastic, non-slip stool. Keep the floor dry. Clean up any water that spills on the floor as soon as it happens. Remove soap buildup in the tub or shower regularly. Attach bath mats securely with double-sided non-slip rug tape. Do not have  throw rugs and other things on the floor that can make you trip. What can I do in the bedroom? Use night lights. Make sure that you have a light by your bed that is easy to reach. Do not use any sheets or blankets that are too big for your bed. They should not hang down onto the floor. Have a firm chair that has side arms. You can use this for support while you get dressed. Do not have throw rugs and other things on the floor that can make you trip. What can I do in the kitchen? Clean up any spills right away. Avoid walking on wet floors. Keep items that you use a lot in easy-to-reach places. If you need to reach something above you, use a strong step stool that has a grab bar. Keep electrical cords out of the way. Do not use floor polish or wax that makes floors slippery. If you must use wax, use non-skid floor wax. Do not have throw rugs and other things on the floor that can make you trip. What can I do with my stairs? Do not leave any items on the stairs. Make sure that there are handrails on both sides  of the stairs and use them. Fix handrails that are broken or loose. Make sure that handrails are as long as the stairways. Check any carpeting to make sure that it is firmly attached to the stairs. Fix any carpet that is loose or worn. Avoid having throw rugs at the top or bottom of the stairs. If you do have throw rugs, attach them to the floor with carpet tape. Make sure that you have a light switch at the top of the stairs and the bottom of the stairs. If you do not have them, ask someone to add them for you. What else can I do to help prevent falls? Wear shoes that: Do not have high heels. Have rubber bottoms. Are comfortable and fit you well. Are closed at the toe. Do not wear sandals. If you use a stepladder: Make sure that it is fully opened. Do not climb a closed stepladder. Make sure that both sides of the stepladder are locked into place. Ask someone to hold it for you, if  possible. Clearly mark and make sure that you can see: Any grab bars or handrails. First and last steps. Where the edge of each step is. Use tools that help you move around (mobility aids) if they are needed. These include: Canes. Walkers. Scooters. Crutches. Turn on the lights when you go into a dark area. Replace any light bulbs as soon as they burn out. Set up your furniture so you have a clear path. Avoid moving your furniture around. If any of your floors are uneven, fix them. If there are any pets around you, be aware of where they are. Review your medicines with your doctor. Some medicines can make you feel dizzy. This can increase your chance of falling. Ask your doctor what other things that you can do to help prevent falls. This information is not intended to replace advice given to you by your health care provider. Make sure you discuss any questions you have with your health care provider. Document Released: 04/29/2009 Document Revised: 12/09/2015 Document Reviewed: 08/07/2014 Elsevier Interactive Patient Education  2017 [employer].

## 2022-10-03 ENCOUNTER — Encounter: Payer: Self-pay | Admitting: Family Medicine

## 2022-10-03 ENCOUNTER — Ambulatory Visit (INDEPENDENT_AMBULATORY_CARE_PROVIDER_SITE_OTHER): Payer: 59 | Admitting: Family Medicine

## 2022-10-03 DIAGNOSIS — L729 Follicular cyst of the skin and subcutaneous tissue, unspecified: Secondary | ICD-10-CM

## 2022-10-03 NOTE — Progress Notes (Signed)
S- CC: Suture removal  Patient had a cyst removal 1 week ago with 3 simple sutures placed.  He is here for removal today.  No fevers, redness, drainage, foul odor.  O- Psych: Age appropriate judgment and insight, normal affect and mood Skin: Pinkish/purple skin surrounding laceration with 3 simple sutures in place.  A/P- Cyst of skin  Cyst successfully removed last week.  3 simple sutures removed without issue today.  He was healing well.  He will let me know if he has any issues moving forward.  He voiced understanding and agreement to the plan.  Crosby Oyster Thadius Smisek 3:36 PM 10/03/22

## 2022-10-16 DIAGNOSIS — H5203 Hypermetropia, bilateral: Secondary | ICD-10-CM | POA: Diagnosis not present

## 2022-10-16 DIAGNOSIS — H524 Presbyopia: Secondary | ICD-10-CM | POA: Diagnosis not present

## 2022-11-16 ENCOUNTER — Other Ambulatory Visit: Payer: Self-pay | Admitting: Family Medicine

## 2022-12-12 ENCOUNTER — Other Ambulatory Visit: Payer: Self-pay | Admitting: Family Medicine

## 2023-03-02 ENCOUNTER — Telehealth: Payer: Self-pay | Admitting: Family Medicine

## 2023-03-02 NOTE — Telephone Encounter (Signed)
Truddie Coco (son [facility]) called stating that pt needed to schedule their CPE. After reviewing chart, noted that there was an odd claim on chart for which there seemed like no visit for a CPE on DOS: 3.14.24. Advised that a note would be sent back to look into this and would call back with more info when available.

## 2023-03-12 NOTE — Telephone Encounter (Signed)
Truddie Coco (son) called back and went over available info. CPE scheduled for 9.24.24

## 2023-03-16 ENCOUNTER — Ambulatory Visit (INDEPENDENT_AMBULATORY_CARE_PROVIDER_SITE_OTHER): Payer: Medicare HMO | Admitting: Family Medicine

## 2023-03-16 ENCOUNTER — Encounter: Payer: Self-pay | Admitting: Family Medicine

## 2023-03-16 VITALS — BP 110/70 | HR 69 | Temp 98.0°F | Ht 71.0 in | Wt 175.0 lb

## 2023-03-16 DIAGNOSIS — M25512 Pain in left shoulder: Secondary | ICD-10-CM

## 2023-03-16 DIAGNOSIS — H9193 Unspecified hearing loss, bilateral: Secondary | ICD-10-CM | POA: Diagnosis not present

## 2023-03-16 DIAGNOSIS — M2550 Pain in unspecified joint: Secondary | ICD-10-CM

## 2023-03-16 MED ORDER — PREDNISONE 20 MG PO TABS: 40.0000 mg | ORAL_TABLET | Freq: Every day | ORAL | 0 refills | Status: AC

## 2023-03-16 NOTE — Progress Notes (Signed)
Musculoskeletal Exam  Patient: Charles Grimes DOB: 1955-10-20  DOS: 03/16/2023  SUBJECTIVE:  Chief Complaint:   Chief Complaint  Patient presents with   Shoulder Pain    Left    Joint Swelling    Charles Grimes is a 67 y.o.  male for evaluation and treatment of L shoulder pain.   Onset:  1 month ago started to worsen. No inj or change in activity.  Location: L shoulder just distal to acromion Character:  sharp  Progression of issue:  has worsened Associated symptoms: starts to hurt towards the end of ROM No bruising, redness, swelling Treatment: to date has been OTC NSAIDS.   Neurovascular symptoms: no  Patient reports several months of diffuse joint pain, fluctuating throughout his knees, hips, shoulders, hands.  He does have a family history of arthritis.  No recent injury or change in activity to explain this.  Of note, he is compliant with his rosuvastatin 20 mg daily.  He had a sister who had joint pain with her cholesterol-lowering medication.  Patient has a history of being hard of hearing.  It is progressively worsening.  He has never had a formal evaluation but is interested in having one.  Past Medical History:  Diagnosis Date   Anginal pain (HCC)    Anxiety    Arthritis    Coronary artery disease, non-occlusive 08/2013   Mild to moderate (40% OM1) single vessel CAD. Otherwise nonobstructed.   Depression    Dyslipidemia, goal LDL below 100    Essential hypertension    Family history of premature CAD    GERD (gastroesophageal reflux disease)    H/O skin disorder    Involving hands. Subsequently resolved.    Nonischemic cardiomyopathy (HCC) 05/2013   Non-ischemic: EF ~45% by Echo & Myoview --> Non-obstructive CAD   Obesity (BMI 30.0-34.9)    OSA (obstructive sleep apnea) 06/21/2017   uses CPAP   Sleep apnea     Objective: VITAL SIGNS: BP 110/70 (BP Location: Left Arm, Patient Position: Sitting, Cuff Size: Normal)   Pulse 69   Temp 98 F (36.7 C)  (Oral)   Ht 5\' 11"  (1.803 m)   Wt 175 lb (79.4 kg)   SpO2 97%   BMI 24.41 kg/m  Constitutional: Well formed, well developed. No acute distress. Thorax & Lungs: No accessory muscle use Musculoskeletal: L shoulder.   Normal active range of motion: yes.   Normal passive range of motion: no, decreased forward flexion Tenderness to palpation: Mild TTP over the lateral deltoid Deformity: no Ecchymosis: no Tests positive: Neer's, Hawkins, empty can Tests negative: Speeds, crossover, liftoff, O'Brien's Neurologic: Normal sensory function. No focal deficits noted. DTR's equal and symmetric in UE's. No clonus. Psychiatric: Normal mood. Age appropriate judgment and insight. Alert & oriented x 3.    Assessment:  Left shoulder pain, unspecified chronicity - Plan: predniSONE (DELTASONE) 20 MG tablet  Arthralgia, unspecified joint  Bilateral hearing loss, unspecified hearing loss type - Plan: Ambulatory referral to Audiology  Plan: 5-day prednisone burst 40 mg daily.  Stretches/exercises, heat, ice, Tylenol.  If no improvement over the next couple weeks, he will return for an injection. Sounds like diffuse arthritis but I am concerned for possible side effect of Crestor.  He will stop this for 2 weeks and send me a message with how he is doing either way.  A list of foods/supplements associated with lower inflammation was provided. Reports progressive hearing loss, will refer to audiology for a formal evaluation. The  patient voiced understanding and agreement to the plan.   Jilda Roche [place], DO 03/16/23  9:40 AM

## 2023-03-16 NOTE — Patient Instructions (Addendum)
If you do not hear anything about your referral in the next 1-2 weeks, call our office and ask for an update.  Heat (pad or rice pillow in microwave) over affected area, 10-15 minutes twice daily.   Ice/cold pack over area for 10-15 min twice daily.  OK to take Tylenol 1000 mg (2 extra strength tabs) or 975 mg (3 regular strength tabs) every 6 hours as needed.  Stop the Crestor/rosuvastatin for 2 weeks. Send me a message in 2 weeks letting me know how things are going.   Foods that may reduce pain: 1) Ginger 2) Blueberries 3) Salmon 4) Pumpkin seeds 5) dark chocolate 6) turmeric 7) tart cherries 8) virgin olive oil 9) chilli peppers 10) mint 11) krill oil  Let us know if you need anything.  EXERCISES  RANGE OF MOTION (ROM) AND STRETCHING EXERCISES These exercises may help you when beginning to rehabilitate your injury. While completing these exercises, remember:  Restoring tissue flexibility helps normal motion to return to the joints. This allows healthier, less painful movement and activity. An effective stretch should be held for at least 30 seconds. A stretch should never be painful. You should only feel a gentle lengthening or release in the stretched tissue.  ROM - Pendulum Bend at the waist so that your right / left arm falls away from your body. Support yourself with your opposite hand on a solid surface, such as a table or a countertop. Your right / left arm should be perpendicular to the ground. If it is not perpendicular, you need to lean over farther. Relax the muscles in your right / left arm and shoulder as much as possible. Gently sway your hips and trunk so they move your right / left arm without any use of your right / left shoulder muscles. Progress your movements so that your right / left arm moves side to side, then forward and backward, and finally, both clockwise and counterclockwise. Complete 10-15 repetitions in each direction. Many people use this  exercise to relieve discomfort in their shoulder as well as to gain range of motion. Repeat 2 times. Complete this exercise 3 times per week.  STRETCH - Flexion, Standing Stand with good posture. With an underhand grip on your right / left hand and an overhand grip on the opposite hand, grasp a broomstick or cane so that your hands are a little more than shoulder-width apart. Keeping your right / left elbow straight and shoulder muscles relaxed, push the stick with your opposite hand to raise your right / left arm in front of your body and then overhead. Raise your arm until you feel a stretch in your right / left shoulder, but before you have increased shoulder pain. Try to avoid shrugging your right / left shoulder as your arm rises by keeping your shoulder blade tucked down and toward your mid-back spine. Hold 30 seconds. Slowly return to the starting position. Repeat 2 times. Complete this exercise 3 times per week.  STRETCH - Internal Rotation Place your right / left hand behind your back, palm-up. Throw a towel or belt over your opposite shoulder. Grasp the towel/belt with your right / left hand. While keeping an upright posture, gently pull up on the towel/belt until you feel a stretch in the front of your right / left shoulder. Avoid shrugging your right / left shoulder as your arm rises by keeping your shoulder blade tucked down and toward your mid-back spine. Hold 30. Release the stretch by lowering your  opposite hand. Repeat 2 times. Complete this exercise 3 times per week.  STRETCH - External Rotation and Abduction Stagger your stance through a doorframe. It does not matter which foot is forward. As instructed by your physician, physical therapist or athletic trainer, place your hands: And forearms above your head and on the door frame. And forearms at head-height and on the door frame. At elbow-height and on the door frame. Keeping your head and chest upright and your stomach  muscles tight to prevent over-extending your low-back, slowly shift your weight onto your front foot until you feel a stretch across your chest and/or in the front of your shoulders. Hold 30 seconds. Shift your weight to your back foot to release the stretch. Repeat 2 times. Complete this stretch 3 times per week.   STRENGTHENING EXERCISES  These exercises may help you when beginning to rehabilitate your injury. They may resolve your symptoms with or without further involvement from your physician, physical therapist or athletic trainer. While completing these exercises, remember:  Muscles can gain both the endurance and the strength needed for everyday activities through controlled exercises. Complete these exercises as instructed by your physician, physical therapist or athletic trainer. Progress the resistance and repetitions only as guided. You may experience muscle soreness or fatigue, but the pain or discomfort you are trying to eliminate should never worsen during these exercises. If this pain does worsen, stop and make certain you are following the directions exactly. If the pain is still present after adjustments, discontinue the exercise until you can discuss the trouble with your clinician. If advised by your physician, during your recovery, avoid activity or exercises which involve actions that place your right / left hand or elbow above your head or behind your back or head. These positions stress the tissues which are trying to heal.  STRENGTH - Scapular Depression and Adduction With good posture, sit on a firm chair. Supported your arms in front of you with pillows, arm rests or a table top. Have your elbows in line with the sides of your body. Gently draw your shoulder blades down and toward your mid-back spine. Gradually increase the tension without tensing the muscles along the top of your shoulders and the back of your neck. Hold for 3 seconds. Slowly release the tension and relax  your muscles completely before completing the next repetition. After you have practiced this exercise, remove the arm support and complete it in standing as well as sitting. Repeat 2 times. Complete this exercise 3 times per week.   STRENGTH - External Rotators Secure a rubber exercise band/tubing to a fixed object so that it is at the same height as your right / left elbow when you are standing or sitting on a firm surface. Stand or sit so that the secured exercise band/tubing is at your side that is not injured. Bend your elbow 90 degrees. Place a folded towel or small pillow under your right / left arm so that your elbow is a few inches away from your side. Keeping the tension on the exercise band/tubing, pull it away from your body, as if pivoting on your elbow. Be sure to keep your body steady so that the movement is only coming from your shoulder rotating. Hold 3 seconds. Release the tension in a controlled manner as you return to the starting position. Repeat 2 times. Complete this exercise 3 times per week.   STRENGTH - Supraspinatus Stand or sit with good posture. Grasp a 2-3 lb weight  or an exercise band/tubing so that your hand is "thumbs-up," like when you shake hands. Slowly lift your right / left hand from your thigh into the air, traveling about 30 degrees from straight out at your side. Lift your hand to shoulder height or as far as you can without increasing any shoulder pain. Initially, many people do not lift their hands above shoulder height. Avoid shrugging your right / left shoulder as your arm rises by keeping your shoulder blade tucked down and toward your mid-back spine. Hold for 3 seconds. Control the descent of your hand as you slowly return to your starting position. Repeat 2 times. Complete this exercise 3 times per week.   STRENGTH - Shoulder Extensors Secure a rubber exercise band/tubing so that it is at the height of your shoulders when you are either standing or  sitting on a firm arm-less chair. With a thumbs-up grip, grasp an end of the band/tubing in each hand. Straighten your elbows and lift your hands straight in front of you at shoulder height. Step back away from the secured end of band/tubing until it becomes tense. Squeezing your shoulder blades together, pull your hands down to the sides of your thighs. Do not allow your hands to go behind you. Hold for 3 seconds. Slowly ease the tension on the band/tubing as you reverse the directions and return to the starting position. Repeat 2 times. Complete this exercise 3 times per week.   STRENGTH - Scapular Retractors Secure a rubber exercise band/tubing so that it is at the height of your shoulders when you are either standing or sitting on a firm arm-less chair. With a palm-down grip, grasp an end of the band/tubing in each hand. Straighten your elbows and lift your hands straight in front of you at shoulder height. Step back away from the secured end of band/tubing until it becomes tense. Squeezing your shoulder blades together, draw your elbows back as you bend them. Keep your upper arm lifted away from your body throughout the exercise. Hold 3 seconds. Slowly ease the tension on the band/tubing as you reverse the directions and return to the starting position. Repeat 2 times. Complete this exercise 3 times per week.  STRENGTH - Scapular Depressors Find a sturdy chair without wheels, such as a from a dining room table. Keeping your feet on the floor, lift your bottom from the seat and lock your elbows. Keeping your elbows straight, allow gravity to pull your body weight down. Your shoulders will rise toward your ears. Raise your body against gravity by drawing your shoulder blades down your back, shortening the distance between your shoulders and ears. Although your feet should always maintain contact with the floor, your feet should progressively support less body weight as you get stronger. Hold 3  seconds. In a controlled and slow manner, lower your body weight to begin the next repetition. Repeat 2 times. Complete this exercise 3 times per week.    This information is not intended to replace advice given to you by your health care provider. Make sure you discuss any questions you have with your health care provider.   Document Released: 05/17/2005 Document Revised: 07/24/2014 Document Reviewed: 10/15/2008 Elsevier Interactive Patient Education [employer]

## 2023-03-19 ENCOUNTER — Other Ambulatory Visit: Payer: Self-pay | Admitting: Family Medicine

## 2023-04-10 ENCOUNTER — Encounter: Payer: Self-pay | Admitting: Family Medicine

## 2023-04-10 ENCOUNTER — Ambulatory Visit (INDEPENDENT_AMBULATORY_CARE_PROVIDER_SITE_OTHER): Payer: Medicare HMO | Admitting: Family Medicine

## 2023-04-10 VITALS — BP 118/80 | HR 63 | Temp 97.7°F | Ht 71.0 in | Wt 174.2 lb

## 2023-04-10 DIAGNOSIS — M7712 Lateral epicondylitis, left elbow: Secondary | ICD-10-CM

## 2023-04-10 DIAGNOSIS — F17210 Nicotine dependence, cigarettes, uncomplicated: Secondary | ICD-10-CM | POA: Diagnosis not present

## 2023-04-10 DIAGNOSIS — Z0001 Encounter for general adult medical examination with abnormal findings: Secondary | ICD-10-CM

## 2023-04-10 DIAGNOSIS — Z0000 Encounter for general adult medical examination without abnormal findings: Secondary | ICD-10-CM

## 2023-04-10 DIAGNOSIS — Z125 Encounter for screening for malignant neoplasm of prostate: Secondary | ICD-10-CM

## 2023-04-10 DIAGNOSIS — M25512 Pain in left shoulder: Secondary | ICD-10-CM

## 2023-04-10 DIAGNOSIS — Z13220 Encounter for screening for lipoid disorders: Secondary | ICD-10-CM

## 2023-04-10 DIAGNOSIS — Z23 Encounter for immunization: Secondary | ICD-10-CM

## 2023-04-10 DIAGNOSIS — G8929 Other chronic pain: Secondary | ICD-10-CM

## 2023-04-10 MED ORDER — METHYLPREDNISOLONE ACETATE 40 MG/ML IJ SUSP
40.0000 mg | Freq: Once | INTRAMUSCULAR | Status: AC
Start: 2023-04-10 — End: 2023-04-10
  Administered 2023-04-10: 40 mg via INTRA_ARTICULAR

## 2023-04-10 MED ORDER — ATORVASTATIN CALCIUM 10 MG PO TABS: 10.0000 mg | ORAL_TABLET | Freq: Every day | ORAL | 3 refills | Status: DC

## 2023-04-10 NOTE — Progress Notes (Signed)
Chief Complaint  Patient presents with   Annual Exam    Discuss aspirin is should be taking daily     Well Male Charles Grimes is here for a complete physical.   His last physical was >1 year ago.  Current diet: in general, a "healthy" diet.   Current exercise: walking Weight trend: stable Fatigue out of ordinary? No. Seat belt? Yes.   Advanced directive? Yes  Health maintenance Shingrix- No Colonoscopy- Yes Tetanus- Yes Hep C- Yes Lung cancer screening- No Pneumonia vaccine- Yes AAA screening- Yes- CTA done in 2022  Chronic L shoulder pain. Decreased ROM, pain on top of shoulder, radiating to neck region. No bruising, redness, swelling, neuro s/s's.   L elbow pain: Pain over outside of L elbow. Pain w lifting. Has not improved. Has not tried anything thus far.  No bruising, redness, or swelling.  No decreased range of motion.  Past Medical History:  Diagnosis Date   Anginal pain (HCC)    Anxiety    Arthritis    Coronary artery disease, non-occlusive 08/2013   Mild to moderate (40% OM1) single vessel CAD. Otherwise nonobstructed.   Depression    Dyslipidemia, goal LDL below 100    Essential hypertension    Family history of premature CAD    GERD (gastroesophageal reflux disease)    H/O skin disorder    Involving hands. Subsequently resolved.    Nonischemic cardiomyopathy (HCC) 05/2013   Non-ischemic: EF ~45% by Echo & Myoview --> Non-obstructive CAD   Obesity (BMI 30.0-34.9)    OSA (obstructive sleep apnea) 06/21/2017   uses CPAP   Sleep apnea      Past Surgical History:  Procedure Laterality Date   COLONOSCOPY  2015   COLONOSCOPY WITH PROPOFOL  01/02/2017   Dr.Pyrtle   LEFT HEART CATHETERIZATION WITH CORONARY ANGIOGRAM  08/26/2013   Mild to moderate disease with 40-50% stenosis and OM1. Otherwise no significant CAD --  Surgeon: Marykay Lex, MD;  Location: [facility] C[facility] LAB;  Service: Cardiovascular;;   Lower extremity arterial Dopplers  06/11/2013   No  occlusive disease   LUMBAR LAMINECTOMY/DECOMPRESSION MICRODISCECTOMY Right 10/07/2014   Procedure: LUMBAR LAMINECTOMY/DECOMPRESSION MICRODISCECTOMY 1 LEVEL;  Surgeon: Donalee Citrin, MD;  Location: [facility] NEURO ORS;  Service: Neurosurgery;  Laterality: Right;  Right L3-L4 Microdiscectomy   NM MYOVIEW LTD  06/03/2013   Low risk study with no ischemia. EF roughly 44% with no regional wall motion abnormalities noted   PFTs  06/16/2013   Normal Volumes &  Spirometry; Moderately reduced DLCO   POLYPECTOMY     SHOULDER HEMI-ARTHROPLASTY Right 05/28/2015   Procedure: RIGHT SHOULDER HEMI-ARTHROPLASTY CTA HEAD AND SUBSCAP REPAIR ;  Surgeon: Beverely Low, MD;  Location: [facility] OR;  Service: Orthopedics;  Laterality: Right;   TRANSTHORACIC ECHOCARDIOGRAM  06/11/2013   Mildly reduced EF: 45-50%. Mild anterior hypokinesis with incoordinate septal motion. No evidence of pulmonary hypertension    Medications  Current Outpatient Medications on File Prior to Visit  Medication Sig Dispense Refill   acetaminophen (TYLENOL) 500 MG tablet Take 1,000 mg by mouth every 6 (six) hours as needed for mild pain, fever or headache.     ALPRAZolam (XANAX) 0.5 MG tablet Take 0.5-1 tablets (0.25-0.5 mg total) by mouth 2 (two) times daily as needed for anxiety. 30 tablet 1   aspirin EC 81 MG tablet Take 81 mg by mouth daily.     lisinopril (ZESTRIL) 10 MG tablet Take 1 tablet (10 mg total) by mouth daily. 90  tablet 3   nitroGLYCERIN (NITROSTAT) 0.4 MG SL tablet Place 1 tablet (0.4 mg total) under the tongue every 5 (five) minutes as needed for chest pain. 30 tablet 1   sertraline (ZOLOFT) 100 MG tablet Take 1 tablet by mouth once daily 90 tablet 0   sertraline (ZOLOFT) 50 MG tablet Take 1 tablet by mouth once daily 90 tablet 0   triamcinolone cream (KENALOG) 0.1 % Apply 1 Application topically 2 (two) times daily. 30 g 0   Allergies Allergies  Allergen Reactions   Isosorbide Nitrate Other (See Comments)    CONTINUOUS HEADACHE     Oxycodone Itching    Reaction was to straight oxycodone 15 mg tablets - no reaction to percocet   Penicillins Hives and Rash    Has patient had a PCN reaction causing immediate rash, facial/tongue/throat swelling, SOB or lightheadedness with hypotension: Yes Has patient had a PCN reaction causing severe rash involving mucus membranes or skin necrosis: No Has patient had a PCN reaction that required hospitalization No Has patient had a PCN reaction occurring within the last 10 years: No If all of the above answers are "NO", then may proceed with Cephalosporin use.    Family History Family History  Problem Relation Age of Onset   Atrial fibrillation Mother        Alive at 18   COPD Mother    Hypertension Mother    Colonic polyp Mother    Hypertension Father        Alive at 46   Lung cancer Father        Chronic smoker   Factor V Leiden deficiency Father        Also Lupus anticoagulant   Heart attack Maternal Grandmother 48   Heart failure Paternal Grandmother    Diabetes Paternal Grandmother    Heart attack Brother 42       X2   Heart attack Sister 67       Deceased   Healthy Sister        x2   Healthy Son        x2   Colon cancer Neg Hx    Esophageal cancer Neg Hx    Rectal cancer Neg Hx    Stomach cancer Neg Hx     Review of Systems: Constitutional:  no fevers Eye:  no recent significant change in vision Ears:  No changes in hearing Nose/Mouth/Throat:  no complaints of nasal congestion, no sore throat Cardiovascular: no chest pain Respiratory:  No shortness of breath Gastrointestinal:  No change in bowel habits GU:  No frequency Integumentary:  no abnormal skin lesions reported Neurologic:  no headaches Endocrine:  denies unexplained weight changes  Exam BP 118/80 (BP Location: Left Arm, Patient Position: Sitting, Cuff Size: Normal)   Pulse 63   Temp 97.7 F (36.5 C) (Oral)   Ht 5\' 11"  (1.803 m)   Wt 174 lb 4 oz (79 kg)   SpO2 98%   BMI 24.30 kg/m   General:  well developed, well nourished, in no apparent distress Skin:  no significant moles, warts, or growths Head:  no masses, lesions, or tenderness Eyes:  pupils equal and round, sclera anicteric without injection Ears:  canals without lesions, TMs shiny without retraction, no obvious effusion, no erythema Nose:  nares patent, mucosa normal Throat/Pharynx:  lips and gingiva without lesion; tongue and uvula midline; non-inflamed pharynx; no exudates or postnasal drainage Lungs:  clear to auscultation, breath sounds equal bilaterally, no respiratory distress  Cardio:  regular rate and rhythm, no LE edema or bruits Rectal: Deferred GI: BS+, S, NT, ND, no masses or organomegaly Musculoskeletal:  symmetrical muscle groups noted without atrophy or deformity Neuro:  gait normal; deep tendon reflexes normal and symmetric Psych: well oriented with normal range of affect and appropriate judgment/insight.  Procedure Note; Shoulder bursa injection Verbal consent obtained. The area was palpated, an area was marked just caudal to the acromion process laterally, and cleaned with alcohol x1. A 27-gauge needle was used to enter the joint laterally with ease. 40 mg of Depomedrol with 2 mL of 1% lidocaine was injected. A bandage was placed.  The patient tolerated the procedure well. There were no complications noted.  Assessment and Plan  Well adult exam - Plan: CBC, Comprehensive metabolic panel, Lipid panel  Smokes less than 1 pack a day with greater than 20 pack year history - Plan: Ambulatory Referral Lung Cancer Screening [facility]  Screening for prostate cancer - Plan: PSA, Medicare ( [place] only)  Chronic left shoulder pain - Plan: PR ARTHROCENTESIS ASPIR&/INJ MAJOR JT/BURSA W/O [place]  Lateral epicondylitis of left elbow  Need for influenza vaccination - Plan: Flu Vaccine Trivalent High Dose (Fluad)   Well 67 y.o. male. Counseled on diet and exercise. Advanced  directive form provided today.  Flu shot today. Shingrix rec'd.  Stop Crestor 2/2 myalgias, start Lipitor 10 mg daily.  Follow-up in 6 weeks. Chronic shoulder pain: Chronic, uncontrolled.  Injections today.  Continue home exercise program.  Heat, ice, Tylenol.  Refer to Ortho if no improvement. Tennis elbow: Chronic, uncontrolled.  Forearm strap recommended in addition to stretches and exercises.  Heat, ice, Tylenol. The patient voiced understanding and agreement to the plan.  Jilda Roche [place], DO 04/10/23 1:33 PM

## 2023-04-10 NOTE — Patient Instructions (Addendum)
Give [place] 2-3 business days to get the results of your labs back.   Keep the diet clean and stay active.  Please get me a copy of your advanced directive form at your convenience.   The Shingrix vaccine (for shingles) is a 2 shot series spaced 2-6 months apart. It can make people feel low energy, achy and almost like they have the flu for 48 hours after injection. 1/5 people can have nausea and/or vomiting. Please plan accordingly when deciding on when to get this shot. Call your pharmacy to get. The second shot of the series is less severe regarding the side effects, but it still lasts 48 hours.   Ice/cold pack over area for 10-15 min twice daily.  Heat (pad or rice pillow in microwave) over affected area, 10-15 minutes twice daily.   Let us know if you need anything.  Consider a forearm strap (Band-IT) to help with your elbow. This can give your elbow a break and allow it to heal faster.  Elbow and Forearm Exercises It is normal to feel mild stretching, pulling, tightness, or discomfort as you do these exercises, but you should stop right away if you feel sudden pain or your pain gets worse.  RANGE OF MOTION EXERCISES These exercises warm up your muscles and joints and improve the movement and flexibility of your injured elbow and forearm. These exercises also help to relieve pain, numbness, and tingling. These exercises are done using the muscles in your injured elbow and forearm. Exercise A: Elbow Flexion, Active Hold your left / right arm at your side, and bend your elbow as far as you can using your left / right arm muscles. Hold this position for 30 seconds. Slowly return to the starting position. Repeat 2 times. Complete this exercise 3 times per week. Exercise B: Elbow Extension, Active Hold your left / right arm at your side, and straighten your elbow as much as you can using your left / right arm muscles. Hold this position for 30 seconds. Slowly return to the starting  position. Repeat 2 times. Complete this exercise 3 times per week. Exercise C: Forearm Rotation, Supination, Active Stand or sit with your elbows at your sides. Bend your left / right elbow to an "L" shape (90 degrees). Turn your palm upward until you feel a gentle stretch on the inside of your forearm. Hold this position for 30 seconds. Slowly release and return to the starting position. Repeat 2 times. Complete this exercise 3 times per week. Exercise D: Forearm Rotation, Pronation, Active Stand or sit with your elbows at your side. Bend your left / right elbow to an "L" shape (90 degrees). Turn your left / right palm downward until you feel a gentle stretch on the top of your forearm. Hold this position for 30 seconds. Slowly release and return to the starting position. Repeat 2 times. Complete this exercise 3 times per week. STRETCHING EXERCISES These exercises warm up your muscles and joints and improve the movement and flexibility of your injured elbow and forearm. These exercises also help to relieve pain, numbness, and tingling. These exercises are done using your healthy elbow and forearm to help stretch the muscles in your injured elbow and forearm. Exercise E: Elbow Flexion, Active-Assisted  Hold your left / right arm at your side, and bend your elbow as much as you can using your left / right arm muscles. Use your other hand to bend your left / right elbow farther. To do this, gently  push up on your forearm until you feel a gentle stretch on the back of your elbow. Hold this position for 30 seconds. Slowly return to the starting position. Repeat 2 times. Complete this exercise 3 times per week. Exercise F: Elbow Extension, Active-Assisted  Hold your left / right arm at your side, and straighten your elbow as much as you can using your left / right arm muscles. Use your other hand to straighten the left / right elbow farther. To do this, gently push down on your forearm until  you feel a gentle stretch on the inside of your elbow. Hold this position for 30 seconds. Slowly return to the starting position. Repeat 2 times. Complete this exercise 3 times per weeky. Exercise G: Forearm Rotation, Supination, Active-Assisted  Sit with your left / right elbow bent in an "L" shape (90 degrees) with your forearm resting on a table. Keeping your upper body and shoulder still, rotate your forearm so your left / right palm faces upward. Use your other hand to help rotate your forearm further until you feel a gentle to moderate stretch. Hold this position for 30 seconds. Slowly release the stretch and return to the starting position. Repeat 2 times. Complete this exercise 3 times per week. Exercise H: Forearm Rotation, Pronation, Active-Assisted  Sit with your left / right elbow bent in an "L" shape (90 degrees) with your forearm resting on a table. Keeping your upper body and shoulder still, rotate your forearm so your palm faces the tabletop. Use your other hand to help rotate your forearm further until you feel a gentle to moderate stretch. Hold this position for 30 seconds. Slowly release the stretch and return to the starting position. Repeat 2 times. Complete this exercise 3 times per week. Exercise I: Elbow Flexion, Supine, Passive Lie on your back. Extend your left / right arm up in the air, bracing it with your other hand. Let your left / right your hand slowly lower toward your shoulder, while your elbow stays pointed toward the ceiling. You should feel a gentle stretch along the back of your upper arm and elbow. If instructed by your health care provider, you may increase the intensity of your stretch by adding a small wrist weight or hand weight. Hold this position for 3 seconds. Slowly return to the starting position. Repeat 2 times. Complete this exercise 3 times per week. Exercise J: Elbow Extension, Supine, Passive  Lie on your back. Make sure that you are  in a comfortable position that lets you relax your arm muscles. Place a folded towel under your left / right upper arm so your elbow and shoulder are at the same height. Straighten your left / right arm so your elbow does not rest on the bed or towel. Let the weight of your hand stretch your elbow. Keep your arm and chest muscles relaxed. You should feel a stretch on the inside of your elbow. If told by your health care provider, you may increase the intensity of your stretch by adding a small wrist weight or hand weight. Hold this position for 30 seconds. Slowly release the stretch. Repeat 2 times. Complete this exercise 3 times per week. STRENGTHENING EXERCISES These exercises build strength and endurance in your elbow and forearm. Endurance is the ability to use your muscles for a long time, even after they get tired. Exercise K: Elbow Flexion, Isometric  Stand or sit up straight. Bend your left / right elbow in an "L" shape (90  degrees) and turn your palm up so your forearm is at the height of your waist. Place your other hand on top of your forearm. Gently push down as your left / right arm resists. Push as hard as you can with both arms without causing any pain or movement at your left / right elbow. Hold this position for 3 seconds. Slowly release the tension in both arms. Let your muscles relax completely before repeating. Repeat 2 times. Complete this exercise 3 times per week. Exercise L: Elbow Extensors, Isometric  Stand or sit up straight. Place your left / right arm so your palm faces your abdomen and it is at the height of your waist. Place your other hand on the underside of your forearm. Gently push up as your left / right arm resists. Push as hard as you can with both arms, without causing any pain or movement at your left / right elbow. Hold this position for 3 seconds. Slowly release the tension in both arms. Let your muscles relax completely before repeating. Repeat  _______2___ times. Complete this exercise 3 times per week. Exercise M: Elbow Flexion With Forearm Palm Up  Sit upright on a firm chair without armrests, or stand. Place your left / right arm at your side with your palm facing forward. Holding a 5 lbweight or gripping a rubber exercise band or tubing, bend your elbow to bring your hand toward your shoulder. Hold this position for 3 seconds. Slowly return to the starting position. Repeat 2 times. Complete this exercise 3 times per week. Exercise N: Elbow Extension  Sit on a firm chair without armrests, or stand. Keeping your upper arms at your sides, bring both hands up toward your left / right shoulder while you grip a rubber exercise band or tubing. Your left / right hand should be just below the other hand. Straighten your left / right elbow. Hold this position for 3 seconds. Control the resistance of the band or tubing as your hand returns to your side. Repeat 2 times. Complete this exercise 3 times per week. Exercise O: Forearm Rotation, Supination  Sit with your left / right forearm supported on a table. Keep your elbow at waist height. Rest your hand over the edge of the table with your palm facing down. Gently hold a lightweight hammer. Without moving your elbow, slowly rotate your forearm to turn your palm and hand upward to a "thumbs-up" position. Hold this position for 3 seconds. Slowly return to the starting position. Repeat 2 times. Complete this exercise 3 times per week. Exercise P: Forearm Rotation, Pronation  Sit with your left / right forearm supported on a table. Keep your elbow below shoulder height. Rest your hand over the edge of the table with your palm facing up. Gently hold a lightweight hammer. Without moving your elbow, slowly rotate your forearm to turn your palm and hand upward to a "thumbs-up" position. Hold this position for 3 seconds. Slowly return to the starting position. Repeat 2 times. Complete  this exercise 3 times per week.  Make sure you discuss any questions you have with your health care provider. Document Released: 05/17/2005 Document Revised: 11/11/2015 Document Reviewed: 03/28/2015 Elsevier Interactive Patient Education  [employer].

## 2023-04-11 ENCOUNTER — Other Ambulatory Visit: Payer: Self-pay | Admitting: Family Medicine

## 2023-04-11 DIAGNOSIS — R945 Abnormal results of liver function studies: Secondary | ICD-10-CM

## 2023-04-11 LAB — COMPREHENSIVE METABOLIC PANEL
ALT: 16 U/L (ref 0–53)
AST: 19 U/L (ref 0–37)
Albumin: 3.5 g/dL (ref 3.5–5.2)
Alkaline Phosphatase: 138 U/L — ABNORMAL HIGH (ref 39–117)
BUN: 18 mg/dL (ref 6–23)
CO2: 27 mEq/L (ref 19–32)
Calcium: 8.5 mg/dL (ref 8.4–10.5)
Chloride: 107 mEq/L (ref 96–112)
Creatinine, Ser: 0.97 mg/dL (ref 0.40–1.50)
GFR: 81.23 mL/min (ref >60.00)
Glucose, Bld: 69 mg/dL — ABNORMAL LOW (ref 70–99)
Potassium: 4.4 mEq/L (ref 3.5–5.1)
Sodium: 140 mEq/L (ref 135–145)
Total Bilirubin: 0.7 mg/dL (ref 0.2–1.2)
Total Protein: 6.8 g/dL (ref 6.0–8.3)

## 2023-04-11 LAB — LIPID PANEL
Cholesterol: 178 mg/dL (ref 0–200)
HDL: 42.7 mg/dL (ref >39.00)
LDL Cholesterol: 113 mg/dL — ABNORMAL HIGH (ref 0–99)
NonHDL: 135.24
Total CHOL/HDL Ratio: 4
Triglycerides: 110 mg/dL (ref 0.0–149.0)
VLDL: 22 mg/dL (ref 0.0–40.0)

## 2023-04-11 LAB — CBC
HCT: 43.9 % (ref 39.0–52.0)
Hemoglobin: 14.2 g/dL (ref 13.0–17.0)
MCHC: 32.3 g/dL (ref 30.0–36.0)
MCV: 87 fl (ref 78.0–100.0)
Platelets: 155 10*3/uL (ref 150.0–400.0)
RBC: 5.04 Mil/uL (ref 4.22–5.81)
RDW: 15.1 % (ref 11.5–15.5)
WBC: 7.7 10*3/uL (ref 4.0–10.5)

## 2023-04-11 LAB — PSA, MEDICARE: PSA: 0.74 ng/ml (ref 0.10–4.00)

## 2023-04-27 ENCOUNTER — Other Ambulatory Visit: Payer: Self-pay | Admitting: Family Medicine

## 2023-04-27 ENCOUNTER — Other Ambulatory Visit (INDEPENDENT_AMBULATORY_CARE_PROVIDER_SITE_OTHER): Payer: Medicare HMO

## 2023-04-27 DIAGNOSIS — R945 Abnormal results of liver function studies: Secondary | ICD-10-CM

## 2023-04-27 LAB — HEPATIC FUNCTION PANEL
ALT: 15 U/L (ref 0–53)
AST: 15 U/L (ref 0–37)
Albumin: 3.5 g/dL (ref 3.5–5.2)
Alkaline Phosphatase: 128 U/L — ABNORMAL HIGH (ref 39–117)
Bilirubin, Direct: 0.2 mg/dL (ref 0.0–0.3)
Total Bilirubin: 0.8 mg/dL (ref 0.2–1.2)
Total Protein: 6.4 g/dL (ref 6.0–8.3)

## 2023-04-27 LAB — GAMMA GT: GGT: 19 U/L (ref 7–51)

## 2023-05-03 LAB — ALKALINE PHOSPHATASE, ISOENZYMES
Alkaline Phosphatase: 144 [IU]/L — ABNORMAL HIGH (ref 44–121)
BONE FRACTION: 29 % (ref 12–68)
INTESTINAL FRAC.: 2 % (ref 0–18)
LIVER FRACTION: 69 % (ref 13–88)

## 2023-05-09 ENCOUNTER — Ambulatory Visit
Admission: EM | Admit: 2023-05-09 | Discharge: 2023-05-09 | Disposition: A | Discharge status: Home / Self Care | Payer: Medicare HMO

## 2023-05-09 ENCOUNTER — Other Ambulatory Visit: Payer: Self-pay

## 2023-05-09 ENCOUNTER — Encounter: Payer: Self-pay | Admitting: *Deleted

## 2023-05-09 DIAGNOSIS — S39012A Strain of muscle, fascia and tendon of lower back, initial encounter: Secondary | ICD-10-CM | POA: Diagnosis not present

## 2023-05-09 DIAGNOSIS — M545 Low back pain, unspecified: Secondary | ICD-10-CM

## 2023-05-09 MED ORDER — METHOCARBAMOL 500 MG PO TABS: 500.0000 mg | ORAL_TABLET | Freq: Two times a day (BID) | ORAL | 0 refills | Status: DC | PRN

## 2023-05-09 NOTE — ED Triage Notes (Signed)
Right side low back pain x 2 weeks. Pain worse with movement. States it is keeping him up at night. Denies urinary Sx; denies fever

## 2023-05-09 NOTE — Discharge Instructions (Signed)
I have prescribed you a muscle relaxer to take as needed.  Please be advised that it can make you drowsy so do not drive or drink alcohol with taking it.  Also recommend that you avoid use with Xanax.  Follow-up with your primary care doctor if symptoms persist or worsen.

## 2023-05-09 NOTE — ED Provider Notes (Signed)
[facility]    CSN: [id number] Arrival date & time: 05/09/23  0816      History   Chief Complaint Chief Complaint  Patient presents with   Back Pain    HPI Charles Grimes is a 67 y.o. male.   Patient presents with family member who helps provide history.  Patient reports he has been having right lower back pain that radiates into his buttocks slightly for 2 weeks.  He denies any injury to the area.  Denies history of chronic back pain.  Patient reports that he does have a history of kidney stones but this does not feel similar.  Denies hematuria, dysuria, urinary frequency, urinary or bowel continence, saddle anesthesia.  He has not taken anything for pain but has been using a heating pad.   Back Pain   Past Medical History:  Diagnosis Date   Anginal pain (HCC)    Anxiety    Arthritis    Coronary artery disease, non-occlusive 08/2013   Mild to moderate (40% OM1) single vessel CAD. Otherwise nonobstructed.   Depression    Dyslipidemia, goal LDL below 100    Essential hypertension    Family history of premature CAD    GERD (gastroesophageal reflux disease)    H/O skin disorder    Involving hands. Subsequently resolved.    Nonischemic cardiomyopathy (HCC) 05/2013   Non-ischemic: EF ~45% by Echo & Myoview --> Non-obstructive CAD   Obesity (BMI 30.0-34.9)    OSA (obstructive sleep apnea) 06/21/2017   uses CPAP   Sleep apnea     Patient Active Problem List   Diagnosis Date Noted   GAD (generalized anxiety disorder) 08/16/2021   Recurrent major depressive disorder, in full remission (HCC) 08/16/2021   Leukocytosis 03/18/2021   Hypocalcemia 03/18/2021   Pancreatitis 03/17/2021   Centrilobular emphysema (HCC) 02/08/2021   Tobacco abuse 02/25/2018   Lateral epicondylitis of left elbow 02/25/2018   OSA (obstructive sleep apnea) 06/21/2017   History of colon polyps 12/29/2016   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   HNP (herniated nucleus pulposus),  lumbar 10/07/2014   Prostate cancer screening 06/10/2014   Visit for preventive health examination 06/10/2014   Fall at home 05/16/2014   Anxiety and depression 01/11/2014   Colon cancer screening 01/11/2014   Former heavy cigarette smoker (20-39 per day) 09/18/2013   Coronary artery disease, non-occlusive 08/17/2013   Essential hypertension 07/28/2013   Dyslipidemia 07/28/2013   Family history of premature CAD    Family history of factor V Leiden mutation 06/17/2013   Obesity (BMI 30-39.9) 05/25/2013   Shortness of breath on exertion 05/23/2013   Intermittent claudication (HCC) 05/23/2013   Nonischemic cardiomyopathy - mild 05/17/2013    Past Surgical History:  Procedure Laterality Date   COLONOSCOPY  2015   COLONOSCOPY WITH PROPOFOL  01/02/2017   Dr.Pyrtle   LEFT HEART CATHETERIZATION WITH CORONARY ANGIOGRAM  08/26/2013   Mild to moderate disease with 40-50% stenosis and OM1. Otherwise no significant CAD --  Surgeon: Marykay Lex, MD;  Location: [facility] CATH LAB;  Service: Cardiovascular;;   Lower extremity arterial Dopplers  06/11/2013   No occlusive disease   LUMBAR LAMINECTOMY/DECOMPRESSION MICRODISCECTOMY Right 10/07/2014   Procedure: LUMBAR LAMINECTOMY/DECOMPRESSION MICRODISCECTOMY 1 LEVEL;  Surgeon: Donalee Citrin, MD;  Location: [facility] OR[facility];  Service: Neurosurgery;  Laterality: Right;  Right L3-L4 Microdiscectomy   NM MYOVIEW LTD  06/03/2013   Low risk study with no ischemia. EF roughly 44% with no regional wall motion abnormalities noted  PFTs  06/16/2013   Normal Volumes &  Spirometry; Moderately reduced DLCO   POLYPECTOMY     SHOULDER HEMI-ARTHROPLASTY Right 05/28/2015   Procedure: RIGHT SHOULDER HEMI-ARTHROPLASTY CTA HEAD AND SUBSCAP REPAIR ;  Surgeon: Beverely Low, MD;  Location: [facility] OR;  Service: Orthopedics;  Laterality: Right;   TRANSTHORACIC ECHOCARDIOGRAM  06/11/2013   Mildly reduced EF: 45-50%. Mild anterior hypokinesis with incoordinate septal motion. No evidence  of pulmonary hypertension       Home Medications    Prior to Admission medications   Medication Sig Start Date End Date Taking? Authorizing Provider  aspirin EC 81 MG tablet Take 81 mg by mouth daily.   Yes [provider]  lansoprazole (PREVACID) 15 MG capsule Take 15 mg by mouth daily at 12 noon.   Yes [provider]  lisinopril (ZESTRIL) 10 MG tablet Take 1 tablet (10 mg total) by mouth daily. 03/31/22  Yes Wendling, Jilda Roche, DO  methocarbamol (ROBAXIN) 500 MG tablet Take 1 tablet (500 mg total) by mouth 2 (two) times daily as needed for muscle spasms. 05/09/23  Yes Kimon Loewen, Rolly Salter E, FNP  rosuvastatin (CRESTOR) 20 MG tablet Take 20 mg by mouth daily.   Yes [provider]  sertraline (ZOLOFT) 100 MG tablet Take 1 tablet by mouth once daily 03/20/23  Yes Wendling, Jilda Roche, DO  sertraline (ZOLOFT) 50 MG tablet Take 1 tablet by mouth once daily 03/20/23  Yes Wendling, Jilda Roche, DO  acetaminophen (TYLENOL) 500 MG tablet Take 1,000 mg by mouth every 6 (six) hours as needed for mild pain, fever or headache.    [provider]  ALPRAZolam Prudy Feeler) 0.5 MG tablet Take 0.5-1 tablets (0.25-0.5 mg total) by mouth 2 (two) times daily as needed for anxiety. 07/18/22   Sharlene Dory, DO  atorvastatin (LIPITOR) 10 MG tablet Take 1 tablet (10 mg total) by mouth daily. 04/10/23   Sharlene Dory, DO  nitroGLYCERIN (NITROSTAT) 0.4 MG SL tablet Place 1 tablet (0.4 mg total) under the tongue every 5 (five) minutes as needed for chest pain. 02/08/21   Sharlene Dory, DO  triamcinolone cream (KENALOG) 0.1 % Apply 1 Application topically 2 (two) times daily. 04/05/22   Sharlene Dory, DO    Family History Family History  Problem Relation Age of Onset   Atrial fibrillation Mother        Alive at 67   COPD Mother    Hypertension Mother    Colonic polyp Mother    Hypertension Father        Alive at 25   Lung cancer Father         Chronic smoker   Factor V Leiden deficiency Father        Also Lupus anticoagulant   Heart attack Maternal Grandmother 48   Heart failure Paternal Grandmother    Diabetes Paternal Grandmother    Heart attack Brother 62       X2   Heart attack Sister 21       Deceased   Healthy Sister        x2   Healthy Son        x2   Colon cancer Neg Hx    Esophageal cancer Neg Hx    Rectal cancer Neg Hx    Stomach cancer Neg Hx     Social History Social History   Tobacco Use   Smoking status: Every Day    Current packs/day: 1.00    Average packs/day:  1 pack/day for 42.0 years (42.0 ttl pk-yrs)    Types: Cigarettes   Smokeless tobacco: Never  Vaping Use   Vaping status: Former   Substances: Flavoring  Substance Use Topics   Alcohol use: Not Currently    Comment: rarely   Drug use: No     Allergies   Isosorbide nitrate, Oxycodone, and Penicillins   Review of Systems Review of Systems Per HPI  Physical Exam Triage Vital Signs ED Triage Vitals  Encounter Vitals Group     BP 05/09/23 0942 126/80     Systolic BP Percentile --      Diastolic BP Percentile --      Pulse Rate 05/09/23 0942 (!) 58     Resp 05/09/23 0942 18     Temp 05/09/23 0942 (!) 97.5 F (36.4 C)     Temp Source 05/09/23 0942 Oral     SpO2 05/09/23 0942 98 %     Weight --      Height --      Head Circumference --      Peak Flow --      Pain Score 05/09/23 0938 7     Pain Loc --      Pain Education --      Exclude from Growth Chart --    No data found.  Updated Vital Signs BP 126/80 (BP Location: Left Arm)   Pulse (!) 58   Temp (!) 97.5 F (36.4 C) (Oral)   Resp 18   SpO2 98%   Visual Acuity Right Eye Distance:   Left Eye Distance:   Bilateral Distance:    Right Eye Near:   Left Eye Near:    Bilateral Near:     Physical Exam Constitutional:      General: He is not in acute distress.    Appearance: Normal appearance. He is not toxic-appearing or diaphoretic.  HENT:     Head:  Normocephalic and atraumatic.  Eyes:     Extraocular Movements: Extraocular movements intact.     Conjunctiva/sclera: Conjunctivae normal.  Pulmonary:     Effort: Pulmonary effort is normal.  Musculoskeletal:       Back:     Comments: Patient has tenderness to palpation to right lower lumbar region.  There is no swelling or discoloration noted.  No direct spinal tenderness, crepitus, step-off noted.  Neurological:     General: No focal deficit present.     Mental Status: He is alert and oriented to person, place, and time. Mental status is at baseline.     Deep Tendon Reflexes: Reflexes are normal and symmetric.  Psychiatric:        Mood and Affect: Mood normal.        Behavior: Behavior normal.        Thought Content: Thought content normal.        Judgment: Judgment normal.      UC Treatments / Results  Labs (all labs ordered are listed, but only abnormal results are displayed) Labs Reviewed - No data to display  EKG   Radiology No results found.  Procedures Procedures (including critical care time)  Medications Ordered in UC Medications - No data to display  Initial Impression / Assessment and Plan / UC Course  I have reviewed the triage vital signs and the nursing notes.  Pertinent labs & imaging results that were available during my care of the patient were reviewed by me and considered in my medical decision making (see chart for  details).     Physical exam is consistent with lumbar muscle strain.  Given no injury or direct bony tenderness, imaging was deferred.  Will treat with muscle relaxer.  Will avoid tizanidine and Flexeril given recently elevated liver enzymes.  Robaxin seems to be safest option for patient.  Although, discussed with patient that this can make him drowsy and do not drive or drink alcohol with taking it.  Also discussed avoiding taking in conjunction with Xanax.  Advised supportive care.  Also advised strict follow-up if any symptoms persist  or worsen.  Patient states that he is not able to take NSAIDs so will defer IM Toradol today.  Will defer steroids as well given patient has had a few steroid courses over the past few months.  Advised strict follow-up precautions.  Patient verbalized understanding and was agreeable with plan. Final Clinical Impressions(s) / UC Diagnoses   Final diagnoses:  Strain of lumbar region, initial encounter  Acute right-sided low back pain without sciatica     Discharge Instructions      I have prescribed you a muscle relaxer to take as needed.  Please be advised that it can make you drowsy so do not drive or drink alcohol with taking it.  Also recommend that you avoid use with Xanax.  Follow-up with your primary care doctor if symptoms persist or worsen.    ED Prescriptions     Medication Sig Dispense Auth. Provider   methocarbamol (ROBAXIN) 500 MG tablet Take 1 tablet (500 mg total) by mouth 2 (two) times daily as needed for muscle spasms. 10 tablet Gustavus Bryant, [place]      PDMP not reviewed this encounter.   Gustavus Bryant, [place] 05/09/23 1021

## 2023-05-16 ENCOUNTER — Observation Stay (HOSPITAL_BASED_OUTPATIENT_CLINIC_OR_DEPARTMENT_OTHER)
Admission: EM | Admit: 2023-05-16 | Discharge: 2023-05-18 | Disposition: A | Discharge status: Home / Self Care | Payer: Medicare HMO | Attending: Surgery | Admitting: Surgery

## 2023-05-16 ENCOUNTER — Emergency Department (HOSPITAL_BASED_OUTPATIENT_CLINIC_OR_DEPARTMENT_OTHER): Payer: Medicare HMO

## 2023-05-16 ENCOUNTER — Other Ambulatory Visit: Payer: Self-pay

## 2023-05-16 ENCOUNTER — Encounter (HOSPITAL_BASED_OUTPATIENT_CLINIC_OR_DEPARTMENT_OTHER): Payer: Self-pay

## 2023-05-16 DIAGNOSIS — I2510 Atherosclerotic heart disease of native coronary artery without angina pectoris: Secondary | ICD-10-CM | POA: Diagnosis not present

## 2023-05-16 DIAGNOSIS — Z1211 Encounter for screening for malignant neoplasm of colon: Secondary | ICD-10-CM | POA: Diagnosis not present

## 2023-05-16 DIAGNOSIS — D72829 Elevated white blood cell count, unspecified: Secondary | ICD-10-CM | POA: Diagnosis not present

## 2023-05-16 DIAGNOSIS — K429 Umbilical hernia without obstruction or gangrene: Secondary | ICD-10-CM | POA: Diagnosis not present

## 2023-05-16 DIAGNOSIS — R109 Unspecified abdominal pain: Secondary | ICD-10-CM

## 2023-05-16 DIAGNOSIS — M545 Low back pain, unspecified: Principal | ICD-10-CM

## 2023-05-16 DIAGNOSIS — Z79899 Other long term (current) drug therapy: Secondary | ICD-10-CM | POA: Diagnosis not present

## 2023-05-16 DIAGNOSIS — Z7982 Long term (current) use of aspirin: Secondary | ICD-10-CM | POA: Diagnosis not present

## 2023-05-16 DIAGNOSIS — Z6839 Body mass index (BMI) 39.0-39.9, adult: Secondary | ICD-10-CM | POA: Diagnosis not present

## 2023-05-16 DIAGNOSIS — E669 Obesity, unspecified: Secondary | ICD-10-CM | POA: Insufficient documentation

## 2023-05-16 DIAGNOSIS — I10 Essential (primary) hypertension: Secondary | ICD-10-CM | POA: Insufficient documentation

## 2023-05-16 DIAGNOSIS — Z9889 Other specified postprocedural states: Secondary | ICD-10-CM | POA: Insufficient documentation

## 2023-05-16 DIAGNOSIS — Z125 Encounter for screening for malignant neoplasm of prostate: Secondary | ICD-10-CM | POA: Insufficient documentation

## 2023-05-16 DIAGNOSIS — R1031 Right lower quadrant pain: Secondary | ICD-10-CM

## 2023-05-16 DIAGNOSIS — K561 Intussusception: Secondary | ICD-10-CM | POA: Diagnosis not present

## 2023-05-16 DIAGNOSIS — J439 Emphysema, unspecified: Secondary | ICD-10-CM | POA: Diagnosis not present

## 2023-05-16 DIAGNOSIS — Z853 Personal history of malignant neoplasm of breast: Secondary | ICD-10-CM | POA: Insufficient documentation

## 2023-05-16 DIAGNOSIS — F17210 Nicotine dependence, cigarettes, uncomplicated: Secondary | ICD-10-CM | POA: Insufficient documentation

## 2023-05-16 LAB — COMPREHENSIVE METABOLIC PANEL
ALT: 21 U/L (ref 0–44)
AST: 24 U/L (ref 15–41)
Albumin: 3.1 g/dL — ABNORMAL LOW (ref 3.5–5.0)
Alkaline Phosphatase: 101 U/L (ref 38–126)
Anion gap: 11 (ref 5–15)
BUN: 21 mg/dL (ref 8–23)
CO2: 22 mmol/L (ref 22–32)
Calcium: 8.2 mg/dL — ABNORMAL LOW (ref 8.9–10.3)
Chloride: 105 mmol/L (ref 98–111)
Creatinine, Ser: 1.03 mg/dL (ref 0.61–1.24)
GFR, Estimated: >60 mL/min (ref >60)
Glucose, Bld: 115 mg/dL — ABNORMAL HIGH (ref 70–99)
Potassium: 3.9 mmol/L (ref 3.5–5.1)
Sodium: 138 mmol/L (ref 135–145)
Total Bilirubin: 0.8 mg/dL (ref 0.3–1.2)
Total Protein: 6.8 g/dL (ref 6.5–8.1)

## 2023-05-16 LAB — URINALYSIS, ROUTINE W REFLEX MICROSCOPIC
Bilirubin Urine: NEGATIVE
Glucose, UA: NEGATIVE mg/dL
Hgb urine dipstick: NEGATIVE
Ketones, ur: NEGATIVE mg/dL
Leukocytes,Ua: NEGATIVE
Nitrite: NEGATIVE
Protein, ur: NEGATIVE mg/dL
Specific Gravity, Urine: >=1.03 (ref 1.005–1.030)
pH: 5.5 (ref 5.0–8.0)

## 2023-05-16 LAB — CBC
HCT: 38.7 % — ABNORMAL LOW (ref 39.0–52.0)
Hemoglobin: 12.6 g/dL — ABNORMAL LOW (ref 13.0–17.0)
MCH: 28.3 pg (ref 26.0–34.0)
MCHC: 32.6 g/dL (ref 30.0–36.0)
MCV: 87 fL (ref 80.0–100.0)
Platelets: 135 10*3/uL — ABNORMAL LOW (ref 150–400)
RBC: 4.45 MIL/uL (ref 4.22–5.81)
RDW: 15 % (ref 11.5–15.5)
WBC: 6.4 10*3/uL (ref 4.0–10.5)
nRBC: 0 % (ref 0.0–0.2)

## 2023-05-16 LAB — LIPASE, BLOOD: Lipase: 24 U/L (ref 11–51)

## 2023-05-16 MED ORDER — FENTANYL CITRATE PF 50 MCG/ML IJ SOSY
50.0000 ug | PREFILLED_SYRINGE | Freq: Once | INTRAMUSCULAR | Status: AC
  Administered 2023-05-16: 50 ug via INTRAVENOUS
  Filled 2023-05-16: qty 1

## 2023-05-16 NOTE — ED Notes (Signed)
Patient transported to CT 

## 2023-05-16 NOTE — ED Triage Notes (Signed)
Pt reports right flank pain that radiates into right side of abdomen; pain started "a couple of weeks" ago and went to Urgent Care on 10/223 and was given a muscle relaxer and told to come to ER if the pain was not getting better. Hx of kidney stones but states this pain is different. Denies dysuria/hematuria/n/v/d.

## 2023-05-16 NOTE — ED Provider Notes (Signed)
[facility] Provider Note   CSN: [id number] Arrival date & time: 05/16/23  1941     History {Add pertinent medical, surgical, social history, OB history to HPI:1} Chief Complaint  Patient presents with   Flank Pain    Charles Grimes is a 66 y.o. male.  HPI     2 weeks ago had severe lower back pain, went to [facility] Pain now traveling around to the front now Right sided back pain, lower, goes to flank and then radiates sometimes down into the RLQ, worse with movement.   Has had kidney stones before but it is different, different quality  of pain, not as severe Sharp pain No fever, nausea, vomiting, diarrhea, pain with urination nor hematuria No chest pain or dyspnea  No loss control bowel or bladder, numbness, weakness, no falls or trauma, no hx IVDU   Home Medications Prior to Admission medications   Medication Sig Start Date End Date Taking? Authorizing Provider  acetaminophen (TYLENOL) 500 MG tablet Take 1,000 mg by mouth every 6 (six) hours as needed for mild pain, fever or headache.    [provider]  ALPRAZolam Prudy Feeler) 0.5 MG tablet Take 0.5-1 tablets (0.25-0.5 mg total) by mouth 2 (two) times daily as needed for anxiety. 07/18/22   Sharlene Dory, DO  aspirin EC 81 MG tablet Take 81 mg by mouth daily.    [provider]  atorvastatin (LIPITOR) 10 MG tablet Take 1 tablet (10 mg total) by mouth daily. 04/10/23   Sharlene Dory, DO  lansoprazole (PREVACID) 15 MG capsule Take 15 mg by mouth daily at 12 noon.    [provider]  lisinopril (ZESTRIL) 10 MG tablet Take 1 tablet (10 mg total) by mouth daily. 03/31/22   Wendling, Jilda Roche, DO  methocarbamol (ROBAXIN) 500 MG tablet Take 1 tablet (500 mg total) by mouth 2 (two) times daily as needed for muscle spasms. 05/09/23   Gustavus Bryant, FNP  nitroGLYCERIN (NITROSTAT) 0.4 MG SL tablet Place 1 tablet (0.4 mg total) under the  tongue every 5 (five) minutes as needed for chest pain. 02/08/21   Sharlene Dory, DO  rosuvastatin (CRESTOR) 20 MG tablet Take 20 mg by mouth daily.    [provider]  sertraline (ZOLOFT) 100 MG tablet Take 1 tablet by mouth once daily 03/20/23   Sharlene Dory, DO  sertraline (ZOLOFT) 50 MG tablet Take 1 tablet by mouth once daily 03/20/23   Sharlene Dory, DO  triamcinolone cream (KENALOG) 0.1 % Apply 1 Application topically 2 (two) times daily. 04/05/22   Sharlene Dory, DO      Allergies    Ibuprofen, Isosorbide nitrate, Oxycodone, and Penicillins    Review of Systems   Review of Systems  Physical Exam Updated Vital Signs BP (!) 145/76   Pulse 65   Temp 98.4 F (36.9 C) (Oral)   Resp 17   Ht 5\' 11"  (1.803 m)   Wt 78.9 kg   SpO2 99%   BMI 24.27 kg/m  Physical Exam  ED Results / Procedures / Treatments   Labs (all labs ordered are listed, but only abnormal results are displayed) Labs Reviewed  COMPREHENSIVE METABOLIC PANEL - Abnormal; Notable for the following components:      Result Value   Glucose, Bld 115 (*)    Calcium 8.2 (*)    Albumin 3.1 (*)    All other components within normal limits  CBC -  Abnormal; Notable for the following components:   Hemoglobin 12.6 (*)    HCT 38.7 (*)    Platelets 135 (*)    All other components within normal limits  LIPASE, BLOOD  URINALYSIS, ROUTINE W REFLEX MICROSCOPIC    EKG None  Radiology No results found.  Procedures Procedures  {Document cardiac monitor, telemetry assessment procedure when appropriate:1}  Medications Ordered in ED Medications - No data to display  ED Course/ Medical Decision Making/ A&P   {   Click here for ABCD2, HEART and other calculatorsREFRESH Note before signing :1}                              Medical Decision Making Amount and/or Complexity of Data Reviewed Labs: ordered.   ***  {Document critical care time when appropriate:1} {Document  review of labs and clinical decision tools ie heart score, Chads2Vasc2 etc:1}  {Document your independent review of radiology images, and any outside records:1} {Document your discussion with family members, caretakers, and with consultants:1} {Document social determinants of health affecting pt's care:1} {Document your decision making why or why not admission, treatments were needed:1} Final Clinical Impression(s) / ED Diagnoses Final diagnoses:  None    Rx / DC Orders ED Discharge Orders     None

## 2023-05-17 ENCOUNTER — Encounter (HOSPITAL_COMMUNITY): Payer: Self-pay

## 2023-05-17 ENCOUNTER — Other Ambulatory Visit: Payer: Self-pay

## 2023-05-17 ENCOUNTER — Ambulatory Visit: Payer: Medicare HMO | Admitting: Audiologist

## 2023-05-17 ENCOUNTER — Inpatient Hospital Stay (HOSPITAL_COMMUNITY): Payer: Medicare HMO

## 2023-05-17 DIAGNOSIS — R109 Unspecified abdominal pain: Secondary | ICD-10-CM | POA: Diagnosis present

## 2023-05-17 DIAGNOSIS — R1084 Generalized abdominal pain: Secondary | ICD-10-CM | POA: Diagnosis not present

## 2023-05-17 DIAGNOSIS — K429 Umbilical hernia without obstruction or gangrene: Secondary | ICD-10-CM | POA: Diagnosis not present

## 2023-05-17 DIAGNOSIS — K5289 Other specified noninfective gastroenteritis and colitis: Secondary | ICD-10-CM | POA: Diagnosis not present

## 2023-05-17 DIAGNOSIS — M545 Low back pain, unspecified: Secondary | ICD-10-CM | POA: Diagnosis not present

## 2023-05-17 LAB — COMPREHENSIVE METABOLIC PANEL
ALT: 20 U/L (ref 0–44)
AST: 20 U/L (ref 15–41)
Albumin: 2.9 g/dL — ABNORMAL LOW (ref 3.5–5.0)
Alkaline Phosphatase: 98 U/L (ref 38–126)
Anion gap: 5 (ref 5–15)
BUN: 22 mg/dL (ref 8–23)
CO2: 24 mmol/L (ref 22–32)
Calcium: 7.9 mg/dL — ABNORMAL LOW (ref 8.9–10.3)
Chloride: 109 mmol/L (ref 98–111)
Creatinine, Ser: 0.88 mg/dL (ref 0.61–1.24)
GFR, Estimated: >60 mL/min (ref >60)
Glucose, Bld: 93 mg/dL (ref 70–99)
Potassium: 3.8 mmol/L (ref 3.5–5.1)
Sodium: 138 mmol/L (ref 135–145)
Total Bilirubin: 0.9 mg/dL (ref 0.3–1.2)
Total Protein: 6.1 g/dL — ABNORMAL LOW (ref 6.5–8.1)

## 2023-05-17 LAB — CBC
HCT: 36 % — ABNORMAL LOW (ref 39.0–52.0)
Hemoglobin: 11.6 g/dL — ABNORMAL LOW (ref 13.0–17.0)
MCH: 28.1 pg (ref 26.0–34.0)
MCHC: 32.2 g/dL (ref 30.0–36.0)
MCV: 87.2 fL (ref 80.0–100.0)
Platelets: 121 10*3/uL — ABNORMAL LOW (ref 150–400)
RBC: 4.13 MIL/uL — ABNORMAL LOW (ref 4.22–5.81)
RDW: 15.3 % (ref 11.5–15.5)
WBC: 5.9 10*3/uL (ref 4.0–10.5)
nRBC: 0 % (ref 0.0–0.2)

## 2023-05-17 LAB — HIV ANTIBODY (ROUTINE TESTING W REFLEX): HIV Screen 4th Generation wRfx: NONREACTIVE

## 2023-05-17 MED ORDER — ONDANSETRON 4 MG PO TBDP: 4.0000 mg | ORAL_TABLET | Freq: Four times a day (QID) | ORAL | Status: DC | PRN

## 2023-05-17 MED ORDER — PANTOPRAZOLE SODIUM 20 MG PO TBEC
20.0000 mg | DELAYED_RELEASE_TABLET | Freq: Every day | ORAL | Status: DC
  Administered 2023-05-17 – 2023-05-18 (×2): 20 mg via ORAL
  Filled 2023-05-17 (×2): qty 1

## 2023-05-17 MED ORDER — POLYETHYLENE GLYCOL 3350 17 G PO PACK: 17.0000 g | PACK | Freq: Every day | ORAL | Status: DC

## 2023-05-17 MED ORDER — ALPRAZOLAM 0.25 MG PO TABS: 0.2500 mg | ORAL_TABLET | Freq: Two times a day (BID) | ORAL | Status: DC | PRN

## 2023-05-17 MED ORDER — ACETAMINOPHEN 500 MG PO TABS: 1000.0000 mg | ORAL_TABLET | Freq: Four times a day (QID) | ORAL | Status: DC | PRN

## 2023-05-17 MED ORDER — SERTRALINE HCL 100 MG PO TABS
100.0000 mg | ORAL_TABLET | Freq: Every day | ORAL | Status: DC
  Administered 2023-05-18: 100 mg via ORAL
  Filled 2023-05-17: qty 1

## 2023-05-17 MED ORDER — IOHEXOL 9 MG/ML PO SOLN
ORAL | Status: AC
  Filled 2023-05-17: qty 1000

## 2023-05-17 MED ORDER — ENOXAPARIN SODIUM 40 MG/0.4ML IJ SOSY
40.0000 mg | PREFILLED_SYRINGE | Freq: Every day | INTRAMUSCULAR | Status: DC
  Administered 2023-05-17 – 2023-05-18 (×2): 40 mg via SUBCUTANEOUS
  Filled 2023-05-17 (×2): qty 0.4

## 2023-05-17 MED ORDER — ATORVASTATIN CALCIUM 10 MG PO TABS
10.0000 mg | ORAL_TABLET | Freq: Every day | ORAL | Status: DC
  Administered 2023-05-17 – 2023-05-18 (×2): 10 mg via ORAL
  Filled 2023-05-17 (×2): qty 1

## 2023-05-17 MED ORDER — KCL IN DEXTROSE-NACL 20-5-0.9 MEQ/L-%-% IV SOLN
INTRAVENOUS | Status: AC
Start: 2023-05-17 — End: 2023-05-18

## 2023-05-17 MED ORDER — HYDROMORPHONE HCL 1 MG/ML IJ SOLN
1.0000 mg | INTRAMUSCULAR | Status: DC | PRN
  Administered 2023-05-17 – 2023-05-18 (×7): 1 mg via INTRAVENOUS
  Filled 2023-05-17 (×7): qty 1

## 2023-05-17 MED ORDER — IOHEXOL 300 MG/ML  SOLN
100.0000 mL | Freq: Once | INTRAMUSCULAR | Status: AC | PRN
  Administered 2023-05-17: 100 mL via INTRAVENOUS

## 2023-05-17 MED ORDER — LISINOPRIL 10 MG PO TABS
10.0000 mg | ORAL_TABLET | Freq: Every day | ORAL | Status: DC
  Administered 2023-05-17 – 2023-05-18 (×2): 10 mg via ORAL
  Filled 2023-05-17 (×2): qty 1

## 2023-05-17 MED ORDER — IOHEXOL 9 MG/ML PO SOLN
500.0000 mL | ORAL | Status: AC
  Administered 2023-05-17 (×2): 500 mL via ORAL

## 2023-05-17 MED ORDER — ONDANSETRON HCL 4 MG/2ML IJ SOLN: 4.0000 mg | Freq: Four times a day (QID) | INTRAMUSCULAR | Status: DC | PRN

## 2023-05-17 MED ORDER — LIDOCAINE 5 % EX PTCH
1.0000 | MEDICATED_PATCH | CUTANEOUS | Status: DC
  Administered 2023-05-17 – 2023-05-18 (×2): 1 via TRANSDERMAL
  Filled 2023-05-17 (×2): qty 1

## 2023-05-17 MED ORDER — POLYETHYLENE GLYCOL 3350 17 G PO PACK
17.0000 g | PACK | Freq: Every day | ORAL | Status: DC
  Administered 2023-05-17 – 2023-05-18 (×2): 17 g via ORAL
  Filled 2023-05-17 (×2): qty 1

## 2023-05-17 MED ORDER — DEXTROSE-SODIUM CHLORIDE 5-0.45 % IV SOLN: INTRAVENOUS | Status: AC

## 2023-05-17 MED ORDER — METHOCARBAMOL 500 MG PO TABS
500.0000 mg | ORAL_TABLET | Freq: Three times a day (TID) | ORAL | Status: DC
  Administered 2023-05-17 – 2023-05-18 (×4): 500 mg via ORAL
  Filled 2023-05-17 (×4): qty 1

## 2023-05-17 MED ORDER — SERTRALINE HCL 50 MG PO TABS
50.0000 mg | ORAL_TABLET | Freq: Every day | ORAL | Status: DC
  Administered 2023-05-17: 50 mg via ORAL
  Filled 2023-05-17: qty 1

## 2023-05-17 NOTE — TOC Initial Note (Signed)
Transition of Care [facility]) - Initial/Assessment Note    Patient Details  Name: Charles Grimes MRN: [id number] Date of Birth: 01-07-1956  Transition of Care [facility]) CM/SW Contact:    Harriett Sine, RN Phone Number: 05/17/2023, 2:28 PM  Clinical Narrative:                 Pt from home, pt has pcp, Spoke with pt at bedside with sons Carron Curie, and brother about DME, NCM role and d/c plans. No SDOH needs at this time, TOC following  Expected Discharge Plan: Home/Self Care Barriers to Discharge: No Barriers Identified   Patient Goals and CMS Choice Patient states their goals for this hospitalization and ongoing recovery are:: none stated CMS Medicare.gov Compare Post Acute Care list provided to::  (waiting for recommendation) Choice offered to / list presented to :  (waiting for recommendation)      Expected Discharge Plan and Services In-house Referral: NA Discharge Planning Services: NA Post Acute Care Choice:  (waiting for recommendation) Living arrangements for the past 2 months: Single Family Home                 DME Arranged:  (waiting for recommendation)         HH Arranged:  (waiting for recommendation)          Prior Living Arrangements/Services Living arrangements for the past 2 months: Single Family Home Lives with:: Self, Siblings, Spouse Patient language and need for interpreter reviewed:: Yes Do you feel safe going back to the place where you live?: Yes      Need for Family Participation in Patient Care: Yes (Comment) Care giver support system in place?: Yes (comment) Current home services:  (none) Criminal Activity/Legal Involvement Pertinent to Current Situation/Hospitalization: No - Comment as needed  Activities of Daily Living   ADL Screening (condition at time of admission) Independently performs ADLs?: Yes (appropriate for developmental age) Is the patient deaf or have difficulty hearing?: Yes Does the patient have difficulty seeing, even when  wearing glasses/contacts?: No Does the patient have difficulty concentrating, remembering, or making decisions?: No  Permission Sought/Granted Permission sought to share information with : Family Supports Permission granted to share information with : Yes, Verbal Permission Granted        Permission granted to share info w Relationship: Sons Danial and Onalee Hua and brother     Emotional Assessment Appearance:: Appears stated age Attitude/Demeanor/Rapport: Engaged Affect (typically observed): Calm Orientation: : Oriented to Self, Oriented to Place, Oriented to  Time, Oriented to Situation Alcohol / Substance Use: Tobacco Use Psych Involvement: No (comment)  Admission diagnosis:  Intussusception (HCC) [K56.1] RLQ abdominal pain [R10.31] Right lumbar pain [M54.50] Right flank pain [R10.9] Abdominal pain [R10.9] Patient Active Problem List   Diagnosis Date Noted   Abdominal pain 05/17/2023   GAD (generalized anxiety disorder) 08/16/2021   Recurrent major depressive disorder, in full remission (HCC) 08/16/2021   Leukocytosis 03/18/2021   Hypocalcemia 03/18/2021   Pancreatitis 03/17/2021   Centrilobular emphysema (HCC) 02/08/2021   Tobacco abuse 02/25/2018   Lateral epicondylitis of left elbow 02/25/2018   OSA (obstructive sleep apnea) 06/21/2017   History of colon polyps 12/29/2016   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   HNP (herniated nucleus pulposus), lumbar 10/07/2014   Prostate cancer screening 06/10/2014   Visit for preventive health examination 06/10/2014   Fall at home 05/16/2014   Anxiety and depression 01/11/2014   Colon cancer screening 01/11/2014   Former heavy cigarette smoker (20-39 per day)  09/18/2013   Coronary artery disease, non-occlusive 08/17/2013   Essential hypertension 07/28/2013   Dyslipidemia 07/28/2013   Family history of premature CAD    Family history of factor V Leiden mutation 06/17/2013   Obesity (BMI 30-39.9) 05/25/2013   Shortness of breath  on exertion 05/23/2013   Intermittent claudication (HCC) 05/23/2013   Nonischemic cardiomyopathy - mild 05/17/2013   PCP:  Sharlene Dory, DO Pharmacy:   [id number] [address]), [place] - [address] [place] ([place]) [place] [address] Phone: [phone] Fax: [phone]     Social Determinants of Health (SDOH) Social History: SDOH Screenings   Food Insecurity: No Food Insecurity (05/17/2023)  Housing: Low Risk  (05/17/2023)  Transportation Needs: No Transportation Needs (05/17/2023)  Utilities: Not At Risk (05/17/2023)  Alcohol Screen: Low Risk  (09/28/2022)  Depression (PHQ2-9): Low Risk  (09/28/2022)  Recent Concern: Depression (PHQ2-9) - High Risk (07/18/2022)  Financial Resource Strain: Low Risk  (06/28/2021)  Physical Activity: Inactive (06/28/2021)  Social Connections: Moderately Isolated (06/28/2021)  Stress: No Stress Concern Present (06/28/2021)  Tobacco Use: High Risk (05/16/2023)   SDOH Interventions:     Readmission Risk Interventions     No data to display

## 2023-05-17 NOTE — Plan of Care (Signed)
  Problem: Clinical Measurements: Goal: Ability to maintain clinical measurements within normal limits will improve Outcome: Progressing   Problem: Activity: Goal: Risk for activity intolerance will decrease Outcome: Progressing   Problem: Nutrition: Goal: Adequate nutrition will be maintained Outcome: Progressing   Problem: Coping: Goal: Level of anxiety will decrease Outcome: Progressing   Problem: Safety: Goal: Ability to remain free from injury will improve Outcome: Progressing   Problem: Skin Integrity: Goal: Risk for impaired skin integrity will decrease Outcome: Progressing   Problem: Pain Management: Goal: General experience of comfort will improve Outcome: Not Progressing

## 2023-05-17 NOTE — ED Notes (Signed)
ED TO INPATIENT HANDOFF REPORT  ED Nurse Name and Phone #: Elnita Maxwell [phone]  S Name/Age/Gender Charles Grimes 67 y.o. male Room/Bed: MH08/MH08  Code Status   Code Status: Full Code  Home/SNF/Other Home Patient oriented to: self, place, time, and situation Is this baseline? Yes   Triage Complete: Triage complete  Chief Complaint Abdominal pain [R10.9]  Triage Note Pt reports right flank pain that radiates into right side of abdomen; pain started "a couple of weeks" ago and went to Urgent Care on 10/223 and was given a muscle relaxer and told to come to ER if the pain was not getting better. Hx of kidney stones but states this pain is different. Denies dysuria/hematuria/n/v/d.   Allergies Allergies  Allergen Reactions   Ibuprofen    Isosorbide Nitrate Other (See Comments)    CONTINUOUS HEADACHE    Oxycodone Itching    Reaction was to straight oxycodone 15 mg tablets - no reaction to percocet   Penicillins Hives and Rash    Has patient had a PCN reaction causing immediate rash, facial/tongue/throat swelling, SOB or lightheadedness with hypotension: Yes Has patient had a PCN reaction causing severe rash involving mucus membranes or skin necrosis: No Has patient had a PCN reaction that required hospitalization No Has patient had a PCN reaction occurring within the last 10 years: No If all of the above answers are "NO", then may proceed with Cephalosporin use.    Level of Care/Admitting Diagnosis ED Disposition     ED Disposition  Admit   Condition  --   Comment  Hospital Area: [facility] [[id number]]  Level of Care: Med-Surg [16]  May admit patient to [facility] or [facility] if equivalent level of care is available:: No  Interfacility transfer: Yes  Covid Evaluation: Asymptomatic - no recent exposure (last 10 days) testing not required  Diagnosis: Abdominal pain [454098]  Admitting Physician: CCS, MD [[id number]]  Attending Physician: CCS, MD [[id number]]   Certification:: I certify this patient will need inpatient services for at least 2 midnights  Expected Medical Readiness: 05/18/2023          B Medical/Surgery History Past Medical History:  Diagnosis Date   Anginal pain (HCC)    Anxiety    Arthritis    Coronary artery disease, non-occlusive 08/2013   Mild to moderate (40% OM1) single vessel CAD. Otherwise nonobstructed.   Depression    Dyslipidemia, goal LDL below 100    Essential hypertension    Family history of premature CAD    GERD (gastroesophageal reflux disease)    H/O skin disorder    Involving hands. Subsequently resolved.    Nonischemic cardiomyopathy (HCC) 05/2013   Non-ischemic: EF ~45% by Echo & Myoview --> Non-obstructive CAD   Obesity (BMI 30.0-34.9)    OSA (obstructive sleep apnea) 06/21/2017   uses CPAP   Sleep apnea    Past Surgical History:  Procedure Laterality Date   COLONOSCOPY  2015   COLONOSCOPY WITH PROPOFOL  01/02/2017   Dr.Pyrtle   LEFT HEART CATHETERIZATION WITH CORONARY ANGIOGRAM  08/26/2013   Mild to moderate disease with 40-50% stenosis and OM1. Otherwise no significant CAD --  Surgeon: Marykay Lex, MD;  Location: [facility] CATH LAB;  Service: Cardiovascular;;   Lower extremity arterial Dopplers  06/11/2013   No occlusive disease   LUMBAR LAMINECTOMY/DECOMPRESSION MICRODISCECTOMY Right 10/07/2014   Procedure: LUMBAR LAMINECTOMY/DECOMPRESSION MICRODISCECTOMY 1 LEVEL;  Surgeon: Donalee Citrin, MD;  Location: [facility] NEURO ORS;  Service: Neurosurgery;  Laterality:  Right;  Right L3-L4 Microdiscectomy   NM MYOVIEW LTD  06/03/2013   Low risk study with no ischemia. EF roughly 44% with no regional wall motion abnormalities noted   PFTs  06/16/2013   Normal Volumes &  Spirometry; Moderately reduced DLCO   POLYPECTOMY     SHOULDER HEMI-ARTHROPLASTY Right 05/28/2015   Procedure: RIGHT SHOULDER HEMI-ARTHROPLASTY CTA HEAD AND SUBSCAP REPAIR ;  Surgeon: Beverely Low, MD;  Location: [facility] OR;  Service: Orthopedics;   Laterality: Right;   TRANSTHORACIC ECHOCARDIOGRAM  06/11/2013   Mildly reduced EF: 45-50%. Mild anterior hypokinesis with incoordinate septal motion. No evidence of pulmonary hypertension     A IV Location/Drains/Wounds Patient Lines/Drains/Airways Status     Active Line/Drains/Airways     Name Placement date Placement time Site Days   Peripheral IV 05/16/23 20 G Left Antecubital 05/16/23  2017  Antecubital  1            Intake/Output Last 24 hours  Intake/Output Summary (Last 24 hours) at 05/17/2023 0431 Last data filed at 05/17/2023 0413 Gross per 24 hour  Intake --  Output 350 ml  Net -350 ml    Labs/Imaging Results for orders placed or performed during the hospital encounter of 05/16/23 (from the past 48 hour(s))  Lipase, blood     Status: None   Collection Time: 05/16/23  8:18 PM  Result Value Ref Range   Lipase 24 11 - 51 U/L    Comment: Performed at [facility], [address] [place], [place] [address]  Comprehensive metabolic panel     Status: Abnormal   Collection Time: 05/16/23  8:18 PM  Result Value Ref Range   Sodium 138 135 - 145 mmol/L   Potassium 3.9 3.5 - 5.1 mmol/L   Chloride 105 98 - 111 mmol/L   CO2 22 22 - 32 mmol/L   Glucose, Bld 115 (H) 70 - 99 mg/dL    Comment: Glucose reference range applies only to samples taken after fasting for at least 8 hours.   BUN 21 8 - 23 mg/dL   Creatinine, Ser 5.57 0.61 - 1.24 mg/dL   Calcium 8.2 (L) 8.9 - 10.3 mg/dL   Total Protein 6.8 6.5 - 8.1 g/dL   Albumin 3.1 (L) 3.5 - 5.0 g/dL   AST 24 15 - 41 U/L   ALT 21 0 - 44 U/L   Alkaline Phosphatase 101 38 - 126 U/L   Total Bilirubin 0.8 0.3 - 1.2 mg/dL   GFR, Estimated >32 >20 mL/min    Comment: (NOTE) Calculated using the CKD-EPI Creatinine Equation (2021)    Anion gap 11 5 - 15    Comment: Performed at [facility], [address] [place], [place] [address]  CBC     Status: Abnormal   Collection Time: 05/16/23  8:18 PM  Result  Value Ref Range   WBC 6.4 4.0 - 10.5 K/uL   RBC 4.45 4.22 - 5.81 MIL/uL   Hemoglobin 12.6 (L) 13.0 - 17.0 g/dL   HCT 06.2 (L) 37.6 - 28.3 %   MCV 87.0 80.0 - 100.0 fL   MCH 28.3 26.0 - 34.0 pg   MCHC 32.6 30.0 - 36.0 g/dL   RDW 15.1 76.1 - 60.7 %   Platelets 135 (L) 150 - 400 K/uL    Comment: SPECIMEN CHECKED FOR CLOTS REPEATED TO VERIFY    nRBC 0.0 0.0 - 0.2 %    Comment: Performed at [facility],  [address] [place], [place] [address]  Urinalysis, Routine w reflex microscopic -Urine, Clean Catch     Status: None   Collection Time: 05/16/23  8:18 PM  Result Value Ref Range   Color, Urine YELLOW YELLOW   APPearance CLEAR CLEAR   Specific Gravity, Urine >=1.030 1.005 - 1.030   pH 5.5 5.0 - 8.0   Glucose, UA NEGATIVE NEGATIVE mg/dL   Hgb urine dipstick NEGATIVE NEGATIVE   Bilirubin Urine NEGATIVE NEGATIVE   Ketones, ur NEGATIVE NEGATIVE mg/dL   Protein, ur NEGATIVE NEGATIVE mg/dL   Nitrite NEGATIVE NEGATIVE   Leukocytes,Ua NEGATIVE NEGATIVE    Comment: Microscopic not done on urines with negative protein, blood, leukocytes, nitrite, or glucose < 500 mg/dL. Performed at [facility], [address] [place], [place] [address]    CT Renal Larina Bras Study  Result Date: 05/16/2023 CLINICAL DATA:  Abdominal and flank pain EXAM: CT ABDOMEN AND PELVIS WITHOUT CONTRAST TECHNIQUE: Multidetector CT imaging of the abdomen and pelvis was performed following the standard protocol without IV contrast. RADIATION DOSE REDUCTION: This exam was performed according to the departmental dose-optimization program which includes automated exposure control, adjustment of the mA and/or kV according to patient size and/or use of iterative reconstruction technique. COMPARISON:  CT angiogram chest abdomen and pelvis 03/17/2021 FINDINGS: Lower chest: There are ground-glass opacities in the dependent portions of the bilateral lower lobes. Mild emphysema again seen. Hepatobiliary: Calcified  granulomas are present. The liver is otherwise within normal limits. Gallbladder and bile ducts are within normal limits. Pancreas: Unremarkable. No pancreatic ductal dilatation or surrounding inflammatory changes. Spleen: Normal in size without focal abnormality. Adrenals/Urinary Tract: Adrenal glands are unremarkable. Kidneys are normal, without renal calculi, focal lesion, or hydronephrosis. Bladder is unremarkable. Stomach/Bowel: Stomach is within normal limits. Appendix appears normal. No evidence of bowel wall thickening, distention, or inflammatory changes. There is short segment intussusception within small bowel in the left abdomen image 43 series 301. Vascular/Lymphatic: Aortic atherosclerosis. No enlarged abdominal or pelvic lymph nodes. Reproductive: Prostate is unremarkable. Other: There is trace free fluid in the pelvis. There is a small fat containing umbilical hernia. There is no free air. Musculoskeletal: Chronic compression deformity of L4 is unchanged. IMPRESSION: 1. Short segment intussusception within small bowel in the left abdomen. No evidence for obstruction. 2. Trace free fluid in the pelvis. 3. Ground-glass opacities in the dependent portions of the bilateral lower lobes worrisome for infection or aspiration. Aortic Atherosclerosis (ICD10-I70.0) and Emphysema (ICD10-J43.9). Electronically Signed   By: Darliss Cheney M.D.   On: 05/16/2023 23:26    Pending Labs Unresulted Labs (From admission, onward)     Start     Ordered   05/17/23 0500  Comprehensive metabolic panel  Tomorrow morning,   R        05/17/23 0040   05/17/23 0500  CBC  Tomorrow morning,   R        05/17/23 0040   05/17/23 0500  HIV Antibody (routine testing w rflx)  (HIV Antibody (Routine testing w reflex) panel)  Once,   URGENT        05/17/23 0101            Vitals/Pain Today's Vitals   05/17/23 0002 05/17/23 0223 05/17/23 0405 05/17/23 0405  BP: 136/71  (!) 144/85   Pulse: 64  60   Resp: 15  16    Temp: 98.6 F (37 C)  (!) 97.5 F (36.4 C)   TempSrc: Oral  Oral   SpO2: 97%  98%   Weight:      Height:      PainSc:  Asleep  7     Isolation Precautions No active isolations  Medications Medications  enoxaparin (LOVENOX) injection 40 mg (has no administration in time range)  dextrose 5 % and 0.9 % NaCl with KCl 20 mEq/L infusion (0 mLs Intravenous Hold 05/17/23 0153)  HYDROmorphone (DILAUDID) injection 1 mg (1 mg Intravenous Given 05/17/23 0409)  ondansetron (ZOFRAN-ODT) disintegrating tablet 4 mg (has no administration in time range)    Or  ondansetron (ZOFRAN) injection 4 mg (has no administration in time range)  dextrose 5 % and 0.45 % NaCl infusion ( Intravenous New Bag/Given 05/17/23 0028)  fentaNYL (SUBLIMAZE) injection 50 mcg (50 mcg Intravenous Given 05/16/23 2232)    Mobility walks     R Recommendations: See Admitting Provider Note  Report given to: Shalini, RN  Additional Notes: A&O x4, GCS 15, pt is ambulatory w/o assistance, uses urinal. RLQ pain with tenderness upon palpitation. Hx of kidney stone, CT showed segment intussusception. 1 mg Dilaudid given 0404. VSS, afebrile, wife at bedside, both very pleasant.

## 2023-05-17 NOTE — Progress Notes (Signed)
Admission notes.  Around 0530, new pt came from [facility]. Pt is A&O x 4. VSS, on room air. C/o right side abdominal pain 5/10 and radiating to back- per emar- pt received prn IV dilaudid at 0409. Will give meds as directed.   2 RN skin check done with Leticia Clas, RN.  Educated pt on falls and safety precautions, plan of care, call bell- pt verbalizes understanding.   Call bell in reach. Will continue to monitor.

## 2023-05-17 NOTE — Progress Notes (Signed)
Patient ID: Charles Grimes, male   DOB: [birth year], 67 y.o.   MRN: [id number]   The patient currently reports minimal abdominal pain and his abdominal exam is fairly benign.  The CT scan showed no evidence of intussusception.  There were some intermittent mild thickened loops of jejunum which was nonspecific.  There was also constipation but the rest of the CT scan was unremarkable  I discussed the findings with the patient and his son.  His symptoms still could be suspicious for biliary colic so I will get an ultrasound of his abdomen to look for gallstones and evaluate the gallbladder more completely in the morning.  I will also allow him to have a soft diet tonight and we will place him on MiraLAX.

## 2023-05-17 NOTE — ED Notes (Signed)
[facility] has arrived to transport pt 

## 2023-05-17 NOTE — ED Notes (Signed)
[facility] called for transport No Current ETA... Ed Nurse will call floor for report Called @ 04:29am

## 2023-05-17 NOTE — H&P (Signed)
Admission Note  Charles Grimes [birth year]  [id number].    Requesting MD: Dr. Dalene Seltzer Chief Complaint/Reason for Consult: intussusception  HPI:  67 y.o. male with medical history significant for CAD, depression, HTN, GERD, OSA, non ischemic cardiomyopathy, lumbar laminectomy, who presented to [facility] ED 10/30 with right sided abdominal and back pain. He had low back pain 2 weeks ago and was started on muscle relaxant without improvement. Pain has changed and now traveling to front right abdomen and right flank. Worse with movement. He has had kidney stones in the past but states this pain is different. Last stone episode was 8 months ago and he did not seek medical care at that time. He feels like he has been passing more flatus over the last month but otherwise no GI complaints - no n/v and has been having normal bowel movements and po intake.  He has had colonoscopy in the past by Dr. Rhea Belton - he can not remember date of last colonoscopy but it was after 2015. Report from 2015 c scope available with numerous polyps removed. Path from 2018 c scope available with tubular adenoma from transverse and hyperplastic polyp from sigmoid. He does not think there were any abnormal findings. He denies personal and family history of GI cancers. He follows regularly with PCP. He dines respiratory complaints  His 2 sons are at bedside  Substance use: everyday cigarette smoker Allergies: oxycodone - itching Blood thinners: none Past Surgeries: no prior abdominal surgeries   ROS: ROS reviewed and negative  Family History  Problem Relation Age of Onset   Atrial fibrillation Mother        Alive at 49   COPD Mother    Hypertension Mother    Colonic polyp Mother    Hypertension Father        Alive at 26   Lung cancer Father        Chronic smoker   Factor V Leiden deficiency Father        Also Lupus anticoagulant   Heart attack Maternal Grandmother 48   Heart failure Paternal  Grandmother    Diabetes Paternal Grandmother    Heart attack Brother 57       X2   Heart attack Sister 9       Deceased   Healthy Sister        x2   Healthy Son        x2   Colon cancer Neg Hx    Esophageal cancer Neg Hx    Rectal cancer Neg Hx    Stomach cancer Neg Hx     Past Medical History:  Diagnosis Date   Anginal pain (HCC)    Anxiety    Arthritis    Coronary artery disease, non-occlusive 08/2013   Mild to moderate (40% OM1) single vessel CAD. Otherwise nonobstructed.   Depression    Dyslipidemia, goal LDL below 100    Essential hypertension    Family history of premature CAD    GERD (gastroesophageal reflux disease)    H/O skin disorder    Involving hands. Subsequently resolved.    Nonischemic cardiomyopathy (HCC) 05/2013   Non-ischemic: EF ~45% by Echo & Myoview --> Non-obstructive CAD   Obesity (BMI 30.0-34.9)    OSA (obstructive sleep apnea) 06/21/2017   uses CPAP   Sleep apnea     Past Surgical History:  Procedure Laterality Date   COLONOSCOPY  2015   COLONOSCOPY WITH PROPOFOL  01/02/2017  Dr.Pyrtle   LEFT HEART CATHETERIZATION WITH CORONARY ANGIOGRAM  08/26/2013   Mild to moderate disease with 40-50% stenosis and OM1. Otherwise no significant CAD --  Surgeon: Marykay Lex, MD;  Location: [facility] CATH LAB;  Service: Cardiovascular;;   Lower extremity arterial Dopplers  06/11/2013   No occlusive disease   LUMBAR LAMINECTOMY/DECOMPRESSION MICRODISCECTOMY Right 10/07/2014   Procedure: LUMBAR LAMINECTOMY/DECOMPRESSION MICRODISCECTOMY 1 LEVEL;  Surgeon: Donalee Citrin, MD;  Location: [facility] NEURO ORS;  Service: Neurosurgery;  Laterality: Right;  Right L3-L4 Microdiscectomy   NM MYOVIEW LTD  06/03/2013   Low risk study with no ischemia. EF roughly 44% with no regional wall motion abnormalities noted   PFTs  06/16/2013   Normal Volumes &  Spirometry; Moderately reduced DLCO   POLYPECTOMY     SHOULDER HEMI-ARTHROPLASTY Right 05/28/2015   Procedure: RIGHT SHOULDER  HEMI-ARTHROPLASTY CTA HEAD AND SUBSCAP REPAIR ;  Surgeon: Beverely Low, MD;  Location: [facility] OR;  Service: Orthopedics;  Laterality: Right;   TRANSTHORACIC ECHOCARDIOGRAM  06/11/2013   Mildly reduced EF: 45-50%. Mild anterior hypokinesis with incoordinate septal motion. No evidence of pulmonary hypertension    Social History:  reports that he has been smoking cigarettes. He has a 42 pack-year smoking history. He has never used smokeless tobacco. He reports that he does not currently use alcohol. He reports that he does not use drugs.  Allergies:  Allergies  Allergen Reactions   Ibuprofen    Isosorbide Nitrate Other (See Comments)    CONTINUOUS HEADACHE    Oxycodone Itching    Reaction was to straight oxycodone 15 mg tablets - no reaction to percocet   Penicillins Hives and Rash    Has patient had a PCN reaction causing immediate rash, facial/tongue/throat swelling, SOB or lightheadedness with hypotension: Yes Has patient had a PCN reaction causing severe rash involving mucus membranes or skin necrosis: No Has patient had a PCN reaction that required hospitalization No Has patient had a PCN reaction occurring within the last 10 years: No If all of the above answers are "NO", then may proceed with Cephalosporin use.    Medications Prior to Admission  Medication Sig Dispense Refill   acetaminophen (TYLENOL) 500 MG tablet Take 1,000 mg by mouth every 6 (six) hours as needed for mild pain, fever or headache.     ALPRAZolam (XANAX) 0.5 MG tablet Take 0.5-1 tablets (0.25-0.5 mg total) by mouth 2 (two) times daily as needed for anxiety. 30 tablet 1   aspirin EC 81 MG tablet Take 81 mg by mouth daily.     atorvastatin (LIPITOR) 10 MG tablet Take 1 tablet (10 mg total) by mouth daily. 90 tablet 3   lansoprazole (PREVACID) 15 MG capsule Take 15 mg by mouth daily at 12 noon.     lisinopril (ZESTRIL) 10 MG tablet Take 1 tablet (10 mg total) by mouth daily. 90 tablet 3   methocarbamol (ROBAXIN) 500 MG  tablet Take 1 tablet (500 mg total) by mouth 2 (two) times daily as needed for muscle spasms. 10 tablet 0   nitroGLYCERIN (NITROSTAT) 0.4 MG SL tablet Place 1 tablet (0.4 mg total) under the tongue every 5 (five) minutes as needed for chest pain. 30 tablet 1   rosuvastatin (CRESTOR) 20 MG tablet Take 20 mg by mouth daily.     sertraline (ZOLOFT) 100 MG tablet Take 1 tablet by mouth once daily 90 tablet 0   sertraline (ZOLOFT) 50 MG tablet Take 1 tablet by mouth once daily 90 tablet 0  triamcinolone cream (KENALOG) 0.1 % Apply 1 Application topically 2 (two) times daily. 30 g 0    Blood pressure 130/80, pulse (!) 54, temperature 97.6 F (36.4 C), resp. rate 18, height 5\' 11"  (1.803 m), weight 78.9 kg, SpO2 97%. Physical Exam: General: pleasant, WD, male who is laying in bed in NAD HEENT: head is normocephalic, atraumatic.  Sclera are noninjected.  Pupils equal and round. EOMs intact.  Ears and nose without any masses or lesions.  Mouth is pink and moist Heart: regular rate Lungs: Respiratory effort nonlabored Abd: soft, ND. Mild TTP RLQ without rebound or guarding. Small reducible UH MSK: all 4 extremities are symmetrical with no cyanosis, clubbing, or edema. Left flank and CVA TTP Skin: warm and dry with no masses, lesions, or rashes Neuro: Cranial nerves 2-12 grossly intact, sensation is normal throughout Psych: A&Ox3 with an appropriate affect.    Results for orders placed or performed during the hospital encounter of 05/16/23 (from the past 48 hour(s))  Lipase, blood     Status: None   Collection Time: 05/16/23  8:18 PM  Result Value Ref Range   Lipase 24 11 - 51 U/L    Comment: Performed at [facility], [address] [place], [place] [address]  Comprehensive metabolic panel     Status: Abnormal   Collection Time: 05/16/23  8:18 PM  Result Value Ref Range   Sodium 138 135 - 145 mmol/L   Potassium 3.9 3.5 - 5.1 mmol/L   Chloride 105 98 - 111 mmol/L   CO2 22 22 - 32  mmol/L   Glucose, Bld 115 (H) 70 - 99 mg/dL    Comment: Glucose reference range applies only to samples taken after fasting for at least 8 hours.   BUN 21 8 - 23 mg/dL   Creatinine, Ser 6.96 0.61 - 1.24 mg/dL   Calcium 8.2 (L) 8.9 - 10.3 mg/dL   Total Protein 6.8 6.5 - 8.1 g/dL   Albumin 3.1 (L) 3.5 - 5.0 g/dL   AST 24 15 - 41 U/L   ALT 21 0 - 44 U/L   Alkaline Phosphatase 101 38 - 126 U/L   Total Bilirubin 0.8 0.3 - 1.2 mg/dL   GFR, Estimated >29 >52 mL/min    Comment: (NOTE) Calculated using the CKD-EPI Creatinine Equation (2021)    Anion gap 11 5 - 15    Comment: Performed at [facility], [address] [place], [place] [address]  CBC     Status: Abnormal   Collection Time: 05/16/23  8:18 PM  Result Value Ref Range   WBC 6.4 4.0 - 10.5 K/uL   RBC 4.45 4.22 - 5.81 MIL/uL   Hemoglobin 12.6 (L) 13.0 - 17.0 g/dL   HCT 44.0 (L) 10.2 - 72.5 %   MCV 87.0 80.0 - 100.0 fL   MCH 28.3 26.0 - 34.0 pg   MCHC 32.6 30.0 - 36.0 g/dL   RDW 36.6 44.0 - 34.7 %   Platelets 135 (L) 150 - 400 K/uL    Comment: SPECIMEN CHECKED FOR CLOTS REPEATED TO VERIFY    nRBC 0.0 0.0 - 0.2 %    Comment: Performed at [facility], [address] [place], [place] [address]  Urinalysis, Routine w reflex microscopic -Urine, Clean Catch     Status: None   Collection Time: 05/16/23  8:18 PM  Result Value Ref Range   Color, Urine YELLOW YELLOW   APPearance CLEAR CLEAR  Specific Gravity, Urine >=1.030 1.005 - 1.030   pH 5.5 5.0 - 8.0   Glucose, UA NEGATIVE NEGATIVE mg/dL   Hgb urine dipstick NEGATIVE NEGATIVE   Bilirubin Urine NEGATIVE NEGATIVE   Ketones, ur NEGATIVE NEGATIVE mg/dL   Protein, ur NEGATIVE NEGATIVE mg/dL   Nitrite NEGATIVE NEGATIVE   Leukocytes,Ua NEGATIVE NEGATIVE    Comment: Microscopic not done on urines with negative protein, blood, leukocytes, nitrite, or glucose < 500 mg/dL. Performed at [facility], [address] [place], [place] [address]    Comprehensive metabolic panel     Status: Abnormal   Collection Time: 05/17/23  4:23 AM  Result Value Ref Range   Sodium 138 135 - 145 mmol/L   Potassium 3.8 3.5 - 5.1 mmol/L   Chloride 109 98 - 111 mmol/L   CO2 24 22 - 32 mmol/L   Glucose, Bld 93 70 - 99 mg/dL    Comment: Glucose reference range applies only to samples taken after fasting for at least 8 hours.   BUN 22 8 - 23 mg/dL   Creatinine, Ser 6.01 0.61 - 1.24 mg/dL   Calcium 7.9 (L) 8.9 - 10.3 mg/dL   Total Protein 6.1 (L) 6.5 - 8.1 g/dL   Albumin 2.9 (L) 3.5 - 5.0 g/dL   AST 20 15 - 41 U/L   ALT 20 0 - 44 U/L   Alkaline Phosphatase 98 38 - 126 U/L   Total Bilirubin 0.9 0.3 - 1.2 mg/dL   GFR, Estimated >09 >32 mL/min    Comment: (NOTE) Calculated using the CKD-EPI Creatinine Equation (2021)    Anion gap 5 5 - 15    Comment: Performed at [facility], [address] [place], [place] [address]  CBC     Status: Abnormal   Collection Time: 05/17/23  4:23 AM  Result Value Ref Range   WBC 5.9 4.0 - 10.5 K/uL   RBC 4.13 (L) 4.22 - 5.81 MIL/uL   Hemoglobin 11.6 (L) 13.0 - 17.0 g/dL   HCT 22.0 (L) 25.4 - 27.0 %   MCV 87.2 80.0 - 100.0 fL   MCH 28.1 26.0 - 34.0 pg   MCHC 32.2 30.0 - 36.0 g/dL   RDW 62.3 76.2 - 83.1 %   Platelets 121 (L) 150 - 400 K/uL    Comment: REPEATED TO VERIFY   nRBC 0.0 0.0 - 0.2 %    Comment: Performed at [facility], [address] [place], [place] [address]   CT Renal Stone Study  Result Date: 05/16/2023 CLINICAL DATA:  Abdominal and flank pain EXAM: CT ABDOMEN AND PELVIS WITHOUT CONTRAST TECHNIQUE: Multidetector CT imaging of the abdomen and pelvis was performed following the standard protocol without IV contrast. RADIATION DOSE REDUCTION: This exam was performed according to the departmental dose-optimization program which includes automated exposure control, adjustment of the mA and/or kV according to patient size and/or use of iterative reconstruction technique.  COMPARISON:  CT angiogram chest abdomen and pelvis 03/17/2021 FINDINGS: Lower chest: There are ground-glass opacities in the dependent portions of the bilateral lower lobes. Mild emphysema again seen. Hepatobiliary: Calcified granulomas are present. The liver is otherwise within normal limits. Gallbladder and bile ducts are within normal limits. Pancreas: Unremarkable. No pancreatic ductal dilatation or surrounding inflammatory changes. Spleen: Normal in size without focal abnormality. Adrenals/Urinary Tract: Adrenal glands are unremarkable. Kidneys are normal, without renal calculi, focal lesion, or hydronephrosis. Bladder is unremarkable. Stomach/Bowel: Stomach is within normal limits.  Appendix appears normal. No evidence of bowel wall thickening, distention, or inflammatory changes. There is short segment intussusception within small bowel in the left abdomen image 43 series 301. Vascular/Lymphatic: Aortic atherosclerosis. No enlarged abdominal or pelvic lymph nodes. Reproductive: Prostate is unremarkable. Other: There is trace free fluid in the pelvis. There is a small fat containing umbilical hernia. There is no free air. Musculoskeletal: Chronic compression deformity of L4 is unchanged. IMPRESSION: 1. Short segment intussusception within small bowel in the left abdomen. No evidence for obstruction. 2. Trace free fluid in the pelvis. 3. Ground-glass opacities in the dependent portions of the bilateral lower lobes worrisome for infection or aspiration. Aortic Atherosclerosis (ICD10-I70.0) and Emphysema (ICD10-J43.9). Electronically Signed   By: Darliss Cheney M.D.   On: 05/16/2023 23:26      Assessment/Plan Possible intussusception   Patient seen and examined and relevant labs and imaging reviewed with CT scan showing Short segment intussusception within small bowel in the left abdomen. No evidence for obstruction. VSS and abdominal exam reassuring. No signs/symptoms of obstruction. Will get CT IV/PO to  evaluate this further as it could be incidental. He does have CVA and RLQ TTP with neg CT stone and UA. If symptoms do not improve and negative repeat CT would recommend  ongoing follow up with PCP  Incidental finding of ground glass opacities in lower lungs on CT . His WBC is normal and he denies respiratory complaints  FEN: npo for CT ID: none indicated  VTE: lovenox  I reviewed ED provider notes, last 24 h vitals and pain scores, last 48 h intake and output, last 24 h labs and trends, and last 24 h imaging results.   Eric Form, [facility] Surgery 05/17/2023, 8:14 AM Please see Amion for pager number during day hours 7:00am-4:30pm

## 2023-05-18 ENCOUNTER — Inpatient Hospital Stay (HOSPITAL_COMMUNITY): Payer: Medicare HMO

## 2023-05-18 DIAGNOSIS — M545 Low back pain, unspecified: Secondary | ICD-10-CM | POA: Diagnosis not present

## 2023-05-18 DIAGNOSIS — R1011 Right upper quadrant pain: Secondary | ICD-10-CM | POA: Diagnosis not present

## 2023-05-18 DIAGNOSIS — K838 Other specified diseases of biliary tract: Secondary | ICD-10-CM | POA: Diagnosis not present

## 2023-05-18 DIAGNOSIS — K5289 Other specified noninfective gastroenteritis and colitis: Secondary | ICD-10-CM | POA: Diagnosis not present

## 2023-05-18 DIAGNOSIS — R1084 Generalized abdominal pain: Secondary | ICD-10-CM | POA: Diagnosis not present

## 2023-05-18 DIAGNOSIS — K760 Fatty (change of) liver, not elsewhere classified: Secondary | ICD-10-CM | POA: Diagnosis not present

## 2023-05-18 MED ORDER — SORBITOL 70 % SOLN
400.0000 mL | TOPICAL_OIL | Freq: Once | ORAL | Status: AC
  Administered 2023-05-18: 400 mL via RECTAL
  Filled 2023-05-18: qty 120

## 2023-05-18 MED ORDER — OXYCODONE-ACETAMINOPHEN 5-325 MG PO TABS: 1.0000 | ORAL_TABLET | Freq: Four times a day (QID) | ORAL | 0 refills | Status: DC | PRN

## 2023-05-18 MED ORDER — POLYETHYLENE GLYCOL 3350 17 G PO PACK: 17.0000 g | PACK | Freq: Every day | ORAL | Status: DC | PRN

## 2023-05-18 MED ORDER — ACETAMINOPHEN 500 MG PO TABS: 1000.0000 mg | ORAL_TABLET | Freq: Four times a day (QID) | ORAL | Status: DC | PRN

## 2023-05-18 MED ORDER — LIDOCAINE 5 % EX PTCH: 1.0000 | MEDICATED_PATCH | CUTANEOUS | 0 refills | Status: DC

## 2023-05-18 NOTE — Plan of Care (Signed)

## 2023-05-18 NOTE — Care Management CC44 (Signed)
Condition Code 44 Documentation Completed  Patient Details  Name: NAKOA GANUS MRN: [id number] Date of Birth: [birth year]   Condition Code 44 given:  Yes Patient signature on Condition Code 44 notice:  Yes Documentation of 2 MD's agreement:  Yes Code 44 added to claim:  Yes    Valentina Shaggy Masato Pettie, LCSW 05/18/2023, 10:02 AM

## 2023-05-18 NOTE — Care Management Obs Status (Signed)
MEDICARE OBSERVATION STATUS NOTIFICATION   Patient Details  Name: Charles Grimes MRN: [id number] Date of Birth: 26-Feb-1956   Medicare Observation Status Notification Given:  Yes    Larrie Kass, LCSW 05/18/2023, 10:02 AM

## 2023-05-18 NOTE — Discharge Summary (Signed)
Patient ID: Charles Grimes [id number] 26-Dec-1955 67 y.o.  Admit date: 05/16/2023 Discharge date: 05/18/2023  Admitting Diagnosis: Abdominal pain, intussusception  Discharge Diagnosis Patient Active Problem List   Diagnosis Date Noted   Abdominal pain 05/17/2023   GAD (generalized anxiety disorder) 08/16/2021   Recurrent major depressive disorder, in full remission (HCC) 08/16/2021   Leukocytosis 03/18/2021   Hypocalcemia 03/18/2021   Pancreatitis 03/17/2021   Centrilobular emphysema (HCC) 02/08/2021   Tobacco abuse 02/25/2018   Lateral epicondylitis of left elbow 02/25/2018   OSA (obstructive sleep apnea) 06/21/2017   History of colon polyps 12/29/2016   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   HNP (herniated nucleus pulposus), lumbar 10/07/2014   Prostate cancer screening 06/10/2014   Visit for preventive health examination 06/10/2014   Fall at home 05/16/2014   Anxiety and depression 01/11/2014   Colon cancer screening 01/11/2014   Former heavy cigarette smoker (20-39 per day) 09/18/2013   Coronary artery disease, non-occlusive 08/17/2013   Essential hypertension 07/28/2013   Dyslipidemia 07/28/2013   Family history of premature CAD    Family history of factor V Leiden mutation 06/17/2013   Obesity (BMI 30-39.9) 05/25/2013   Shortness of breath on exertion 05/23/2013   Intermittent claudication (HCC) 05/23/2013   Nonischemic cardiomyopathy - mild 05/17/2013  enteritis  Consultants none  Reason for Admission: 67 y.o. male with medical history significant for CAD, depression, HTN, GERD, OSA, non ischemic cardiomyopathy, lumbar laminectomy, who presented to [facility] ED 10/30 with right sided abdominal and back pain. He had low back pain 2 weeks ago and was started on muscle relaxant without improvement. Pain has changed and now traveling to front right abdomen and right flank. Worse with movement. He has had kidney stones in the past but states this pain is  different. Last stone episode was 8 months ago and he did not seek medical care at that time. He feels like he has been passing more flatus over the last month but otherwise no GI complaints - no n/v and has been having normal bowel movements and po intake.   He has had colonoscopy in the past by Dr. Rhea Belton - he can not remember date of last colonoscopy but it was after 2015. Report from 2015 c scope available with numerous polyps removed. Path from 2018 c scope available with tubular adenoma from transverse and hyperplastic polyp from sigmoid. He does not think there were any abnormal findings. He denies personal and family history of GI cancers. He follows regularly with PCP. He dines respiratory complaints   His 2 sons are at bedside  Procedures none  Hospital Course:  The patient was admitted and underwent a new CT scan that revealed no further intussusception, but some thickening in the small bowel consistent with enteritis.  He did undergo an abdominal US to make sure his gallbladder was not the etiology.  This revealed sludge, but no stones or evidence of cholecystitis.  His diet was advanced as tolerated.  He as otherwise stable for DC home.  Physical Exam: Gen: NAD Heart: regular Lungs: CTAB Abd: soft, minimally tender, +BS, ND  Allergies as of 05/18/2023       Reactions   Ibuprofen    Isosorbide Nitrate Other (See Comments)   CONTINUOUS HEADACHE    Oxycodone Itching   Reaction was to straight oxycodone 15 mg tablets - no reaction to percocet   Penicillins Hives, Rash   Has patient had a PCN reaction causing immediate rash, facial/tongue/throat swelling, SOB  or lightheadedness with hypotension: Yes Has patient had a PCN reaction causing severe rash involving mucus membranes or skin necrosis: No Has patient had a PCN reaction that required hospitalization No Has patient had a PCN reaction occurring within the last 10 years: No If all of the above answers are "NO", then may  proceed with Cephalosporin use.        Medication List     TAKE these medications    acetaminophen 500 MG tablet Commonly known as: TYLENOL Take 2 tablets (1,000 mg total) by mouth every 6 (six) hours as needed for mild pain (pain score 1-3).   ALPRAZolam 0.5 MG tablet Commonly known as: Xanax Take 0.5-1 tablets (0.25-0.5 mg total) by mouth 2 (two) times daily as needed for anxiety.   aspirin EC 81 MG tablet Take 81 mg by mouth daily.   atorvastatin 10 MG tablet Commonly known as: LIPITOR Take 1 tablet (10 mg total) by mouth daily.   lansoprazole 15 MG capsule Commonly known as: PREVACID Take 15 mg by mouth daily at 12 noon.   lisinopril 10 MG tablet Commonly known as: ZESTRIL Take 1 tablet (10 mg total) by mouth daily.   methocarbamol 500 MG tablet Commonly known as: ROBAXIN Take 1 tablet (500 mg total) by mouth 2 (two) times daily as needed for muscle spasms.   naproxen sodium 220 MG tablet Commonly known as: ALEVE Take 220 mg by mouth 2 (two) times daily as needed (pain).   nitroGLYCERIN 0.4 MG SL tablet Commonly known as: NITROSTAT Place 1 tablet (0.4 mg total) under the tongue every 5 (five) minutes as needed for chest pain.   polyethylene glycol 17 g packet Commonly known as: MIRALAX / GLYCOLAX Take 17 g by mouth daily as needed.   sertraline 50 MG tablet Commonly known as: ZOLOFT Take 1 tablet by mouth once daily What changed: when to take this   sertraline 100 MG tablet Commonly known as: ZOLOFT Take 1 tablet by mouth once daily What changed: Another medication with the same name was changed. Make sure you understand how and when to take each.   triamcinolone cream 0.1 % Commonly known as: KENALOG Apply 1 Application topically 2 (two) times daily. What changed:  when to take this reasons to take this          Follow-up Information     Sharlene Dory, DO Follow up.   Specialty: Family Medicine Why: As needed Contact  information: [address] [place] [address] [phone]                 Signed: Barnetta Chapel, [facility] Surgery 05/18/2023, 9:00 AM Please see Amion for pager number during day hours 7:00am-4:30pm, 7-11:30am on Weekends

## 2023-05-21 ENCOUNTER — Telehealth: Payer: Self-pay

## 2023-05-21 NOTE — Transitions of Care (Post Inpatient/ED Visit) (Signed)
   05/21/2023  Name: Charles Grimes MRN: [id number] DOB: [birth year]  Today's TOC FU Call Status: Today's TOC FU Call Status:: Unsuccessful Call (1st Attempt) Unsuccessful Call (1st Attempt) Date: 05/21/23  Attempted to reach the patient regarding the most recent Inpatient/ED visit.  Follow Up Plan: Additional outreach attempts will be made to reach the patient to complete the Transitions of Care (Post Inpatient/ED visit) call.   Signature Karena Addison, LPN [facility] Nurse Health Advisor Direct Dial [phone]

## 2023-05-24 ENCOUNTER — Other Ambulatory Visit: Payer: Self-pay | Admitting: Family Medicine

## 2023-05-24 NOTE — Transitions of Care (Post Inpatient/ED Visit) (Signed)
   05/24/2023  Name: Charles Grimes MRN: [id number] DOB: 05-03-1956  Today's TOC FU Call Status: Today's TOC FU Call Status:: Unsuccessful Call (2nd Attempt) Unsuccessful Call (1st Attempt) Date: 05/21/23 Unsuccessful Call (2nd Attempt) Date: 05/24/23  Attempted to reach the patient regarding the most recent Inpatient/ED visit.  Follow Up Plan: Additional outreach attempts will be made to reach the patient to complete the Transitions of Care (Post Inpatient/ED visit) call.   Signature Karena Addison, LPN [facility] Nurse Health Advisor Direct Dial [phone]

## 2023-05-25 ENCOUNTER — Encounter: Payer: Self-pay | Admitting: Family Medicine

## 2023-05-25 ENCOUNTER — Ambulatory Visit (INDEPENDENT_AMBULATORY_CARE_PROVIDER_SITE_OTHER): Payer: Medicare HMO | Admitting: Family Medicine

## 2023-05-25 VITALS — BP 128/74 | HR 66 | Temp 98.0°F | Resp 16 | Ht 71.0 in | Wt 177.4 lb

## 2023-05-25 DIAGNOSIS — M545 Low back pain, unspecified: Secondary | ICD-10-CM | POA: Diagnosis not present

## 2023-05-25 LAB — COMPREHENSIVE METABOLIC PANEL
ALT: 15 U/L (ref 0–53)
AST: 18 U/L (ref 0–37)
Albumin: 3.7 g/dL (ref 3.5–5.2)
Alkaline Phosphatase: 117 U/L (ref 39–117)
BUN: 23 mg/dL (ref 6–23)
CO2: 28 meq/L (ref 19–32)
Calcium: 8.8 mg/dL (ref 8.4–10.5)
Chloride: 106 meq/L (ref 96–112)
Creatinine, Ser: 1.1 mg/dL (ref 0.40–1.50)
GFR: 69.79 mL/min (ref >60.00)
Glucose, Bld: 73 mg/dL (ref 70–99)
Potassium: 4.6 meq/L (ref 3.5–5.1)
Sodium: 140 meq/L (ref 135–145)
Total Bilirubin: 0.6 mg/dL (ref 0.2–1.2)
Total Protein: 7 g/dL (ref 6.0–8.3)

## 2023-05-25 LAB — CBC
HCT: 42 % (ref 39.0–52.0)
Hemoglobin: 13.8 g/dL (ref 13.0–17.0)
MCHC: 32.9 g/dL (ref 30.0–36.0)
MCV: 87.8 fL (ref 78.0–100.0)
Platelets: 152 10*3/uL (ref 150.0–400.0)
RBC: 4.78 Mil/uL (ref 4.22–5.81)
RDW: 15.7 % — ABNORMAL HIGH (ref 11.5–15.5)
WBC: 6.7 10*3/uL (ref 4.0–10.5)

## 2023-05-25 MED ORDER — TIZANIDINE HCL 4 MG PO TABS: 4.0000 mg | ORAL_TABLET | Freq: Four times a day (QID) | ORAL | 0 refills | Status: DC | PRN

## 2023-05-25 NOTE — Patient Instructions (Addendum)
Ice/cold pack over area for 10-15 min twice daily.  Heat (pad or rice pillow in microwave) over affected area, 10-15 minutes twice daily.   OK to take Tylenol 1000 mg (2 extra strength tabs) or 975 mg (3 regular strength tabs) every 6 hours as needed.  Take Flexeril (cyclobenzaprine) 1-2 hours before planned bedtime. If it makes you drowsy, do not take during the day. You can try half a tab the following night.  Try 2 tablespoons of milk of mag in 4 oz of warm prune juice. Do that and wait a couple hours. If no improvement, try a Dulcolax suppository and then let me know if we are still having issues.   Let us know if you need anything.  EXERCISES  RANGE OF MOTION (ROM) AND STRETCHING EXERCISES - Low Back Pain Most people with lower back pain will find that their symptoms get worse with excessive bending forward (flexion) or arching at the lower back (extension). The exercises that will help resolve your symptoms will focus on the opposite motion.  If you have pain, numbness or tingling which travels down into your buttocks, leg or foot, the goal of the therapy is for these symptoms to move closer to your back and eventually resolve. Sometimes, these leg symptoms will get better, but your lower back pain may worsen. This is often an indication of progress in your rehabilitation. Be very alert to any changes in your symptoms and the activities in which you participated in the 24 hours prior to the change. Sharing this information with your caregiver will allow him or her to most efficiently treat your condition. These exercises may help you when beginning to rehabilitate your injury. Your symptoms may resolve with or without further involvement from your physician, physical therapist or athletic trainer. While completing these exercises, remember:  Restoring tissue flexibility helps normal motion to return to the joints. This allows healthier, less painful movement and activity. An effective stretch  should be held for at least 30 seconds. A stretch should never be painful. You should only feel a gentle lengthening or release in the stretched tissue. FLEXION RANGE OF MOTION AND STRETCHING EXERCISES:  STRETCH - Flexion, Single Knee to Chest  Lie on a firm bed or floor with both legs extended in front of you. Keeping one leg in contact with the floor, bring your opposite knee to your chest. Hold your leg in place by either grabbing behind your thigh or at your knee. Pull until you feel a gentle stretch in your low back. Hold 30 seconds. Slowly release your grasp and repeat the exercise with the opposite side. Repeat 2 times. Complete this exercise 3 times per week.   STRETCH - Flexion, Double Knee to Chest Lie on a firm bed or floor with both legs extended in front of you. Keeping one leg in contact with the floor, bring your opposite knee to your chest. Tense your stomach muscles to support your back and then lift your other knee to your chest. Hold your legs in place by either grabbing behind your thighs or at your knees. Pull both knees toward your chest until you feel a gentle stretch in your low back. Hold 30 seconds. Tense your stomach muscles and slowly return one leg at a time to the floor. Repeat 2 times. Complete this exercise 3 times per week.   STRETCH - Low Trunk Rotation Lie on a firm bed or floor. Keeping your legs in front of you, bend your knees so  they are both pointed toward the ceiling and your feet are flat on the floor. Extend your arms out to the side. This will stabilize your upper body by keeping your shoulders in contact with the floor. Gently and slowly drop both knees together to one side until you feel a gentle stretch in your low back. Hold for 30 seconds. Tense your stomach muscles to support your lower back as you bring your knees back to the starting position. Repeat the exercise to the other side. Repeat 2 times. Complete this exercise at least 3 times per  week.   EXTENSION RANGE OF MOTION AND FLEXIBILITY EXERCISES:  STRETCH - Extension, Prone on Elbows  Lie on your stomach on the floor, a bed will be too soft. Place your palms about shoulder width apart and at the height of your head. Place your elbows under your shoulders. If this is too painful, stack pillows under your chest. Allow your body to relax so that your hips drop lower and make contact more completely with the floor. Hold this position for 30 seconds. Slowly return to lying flat on the floor. Repeat 2 times. Complete this exercise 3 times per week.   RANGE OF MOTION - Extension, Prone Press Ups Lie on your stomach on the floor, a bed will be too soft. Place your palms about shoulder width apart and at the height of your head. Keeping your back as relaxed as possible, slowly straighten your elbows while keeping your hips on the floor. You may adjust the placement of your hands to maximize your comfort. As you gain motion, your hands will come more underneath your shoulders. Hold this position 30 seconds. Slowly return to lying flat on the floor. Repeat 2 times. Complete this exercise 3 times per week.   RANGE OF MOTION- Quadruped, Neutral Spine  Assume a hands and knees position on a firm surface. Keep your hands under your shoulders and your knees under your hips. You may place padding under your knees for comfort. Drop your head and point your tailbone toward the ground below you. This will round out your lower back like an angry cat. Hold this position for 30 seconds. Slowly lift your head and release your tail bone so that your back sags into a large arch, like an old horse. Hold this position for 30 seconds. Repeat this until you feel limber in your low back. Now, find your "sweet spot." This will be the most comfortable position somewhere between the two previous positions. This is your neutral spine. Once you have found this position, tense your stomach muscles to support  your low back. Hold this position for 30 seconds. Repeat 2 times. Complete this exercise 3 times per week.   STRENGTHENING EXERCISES - Low Back Sprain These exercises may help you when beginning to rehabilitate your injury. These exercises should be done near your "sweet spot." This is the neutral, low-back arch, somewhere between fully rounded and fully arched, that is your least painful position. When performed in this safe range of motion, these exercises can be used for people who have either a flexion or extension based injury. These exercises may resolve your symptoms with or without further involvement from your physician, physical therapist or athletic trainer. While completing these exercises, remember:  Muscles can gain both the endurance and the strength needed for everyday activities through controlled exercises. Complete these exercises as instructed by your physician, physical therapist or athletic trainer. Increase the resistance and repetitions only as guided.  You may experience muscle soreness or fatigue, but the pain or discomfort you are trying to eliminate should never worsen during these exercises. If this pain does worsen, stop and make certain you are following the directions exactly. If the pain is still present after adjustments, discontinue the exercise until you can discuss the trouble with your caregiver.  STRENGTHENING - Deep Abdominals, Pelvic Tilt  Lie on a firm bed or floor. Keeping your legs in front of you, bend your knees so they are both pointed toward the ceiling and your feet are flat on the floor. Tense your lower abdominal muscles to press your low back into the floor. This motion will rotate your pelvis so that your tail bone is scooping upwards rather than pointing at your feet or into the floor. With a gentle tension and even breathing, hold this position for 3 seconds. Repeat 2 times. Complete this exercise 3 times per week.   STRENGTHENING - Abdominals,  Crunches  Lie on a firm bed or floor. Keeping your legs in front of you, bend your knees so they are both pointed toward the ceiling and your feet are flat on the floor. Cross your arms over your chest. Slightly tip your chin down without bending your neck. Tense your abdominals and slowly lift your trunk high enough to just clear your shoulder blades. Lifting higher can put excessive stress on the lower back and does not further strengthen your abdominal muscles. Control your return to the starting position. Repeat 2 times. Complete this exercise 3 times per week.   STRENGTHENING - Quadruped, Opposite UE/LE Lift  Assume a hands and knees position on a firm surface. Keep your hands under your shoulders and your knees under your hips. You may place padding under your knees for comfort. Find your neutral spine and gently tense your abdominal muscles so that you can maintain this position. Your shoulders and hips should form a rectangle that is parallel with the floor and is not twisted. Keeping your trunk steady, lift your right hand no higher than your shoulder and then your left leg no higher than your hip. Make sure you are not holding your breath. Hold this position for 30 seconds. Continuing to keep your abdominal muscles tense and your back steady, slowly return to your starting position. Repeat with the opposite arm and leg. Repeat 2 times. Complete this exercise 3 times per week.   STRENGTHENING - Abdominals and Quadriceps, Straight Leg Raise  Lie on a firm bed or floor with both legs extended in front of you. Keeping one leg in contact with the floor, bend the other knee so that your foot can rest flat on the floor. Find your neutral spine, and tense your abdominal muscles to maintain your spinal position throughout the exercise. Slowly lift your straight leg off the floor about 6 inches for a count of 3, making sure to not hold your breath. Still keeping your neutral spine, slowly lower  your leg all the way to the floor. Repeat this exercise with each leg 2 times. Complete this exercise 3 times per week.  POSTURE AND BODY MECHANICS CONSIDERATIONS - Low Back Sprain Keeping correct posture when sitting, standing or completing your activities will reduce the stress put on different body tissues, allowing injured tissues a chance to heal and limiting painful experiences. The following are general guidelines for improved posture.  While reading these guidelines, remember: The exercises prescribed by your provider will help you have the flexibility and strength to  maintain correct postures. The correct posture provides the best environment for your joints to work. All of your joints have less wear and tear when properly supported by a spine with good posture. This means you will experience a healthier, less painful body. Correct posture must be practiced with all of your activities, especially prolonged sitting and standing. Correct posture is as important when doing repetitive low-stress activities (typing) as it is when doing a single heavy-load activity (lifting).  RESTING POSITIONS Consider which positions are most painful for you when choosing a resting position. If you have pain with flexion-based activities (sitting, bending, stooping, squatting), choose a position that allows you to rest in a less flexed posture. You would want to avoid curling into a fetal position on your side. If your pain worsens with extension-based activities (prolonged standing, working overhead), avoid resting in an extended position such as sleeping on your stomach. Most people will find more comfort when they rest with their spine in a more neutral position, neither too rounded nor too arched. Lying on a non-sagging bed on your side with a pillow between your knees, or on your back with a pillow under your knees will often provide some relief. Keep in mind, being in any one position for a prolonged period of  time, no matter how correct your posture, can still lead to stiffness.  PROPER SITTING POSTURE In order to minimize stress and discomfort on your spine, you must sit with correct posture. Sitting with good posture should be effortless for a healthy body. Returning to good posture is a gradual process. Many people can work toward this most comfortably by using various supports until they have the flexibility and strength to maintain this posture on their own. When sitting with proper posture, your ears will fall over your shoulders and your shoulders will fall over your hips. You should use the back of the chair to support your upper back. Your lower back will be in a neutral position, just slightly arched. You may place a small pillow or folded towel at the base of your lower back for  support.  When working at a desk, create an environment that supports good, upright posture. Without extra support, muscles tire, which leads to excessive strain on joints and other tissues. Keep these recommendations in mind:  CHAIR: A chair should be able to slide under your desk when your back makes contact with the back of the chair. This allows you to work closely. The chair's height should allow your eyes to be level with the upper part of your monitor and your hands to be slightly lower than your elbows.  BODY POSITION Your feet should make contact with the floor. If this is not possible, use a foot rest. Keep your ears over your shoulders. This will reduce stress on your neck and low back.  INCORRECT SITTING POSTURES  If you are feeling tired and unable to assume a healthy sitting posture, do not slouch or slump. This puts excessive strain on your back tissues, causing more damage and pain. Healthier options include: Using more support, like a lumbar pillow. Switching tasks to something that requires you to be upright or walking. Talking a brief walk. Lying down to rest in a neutral-spine  position.  PROLONGED STANDING WHILE SLIGHTLY LEANING FORWARD  When completing a task that requires you to lean forward while standing in one place for a long time, place either foot up on a stationary 2-4 inch high object to help maintain  the best posture. When both feet are on the ground, the lower back tends to lose its slight inward curve. If this curve flattens (or becomes too large), then the back and your other joints will experience too much stress, tire more quickly, and can cause pain.  CORRECT STANDING POSTURES Proper standing posture should be assumed with all daily activities, even if they only take a few moments, like when brushing your teeth. As in sitting, your ears should fall over your shoulders and your shoulders should fall over your hips. You should keep a slight tension in your abdominal muscles to brace your spine. Your tailbone should point down to the ground, not behind your body, resulting in an over-extended swayback posture.   INCORRECT STANDING POSTURES  Common incorrect standing postures include a forward head, locked knees and/or an excessive swayback. WALKING Walk with an upright posture. Your ears, shoulders and hips should all line-up.  PROLONGED ACTIVITY IN A FLEXED POSITION When completing a task that requires you to bend forward at your waist or lean over a low surface, try to find a way to stabilize 3 out of 4 of your limbs. You can place a hand or elbow on your thigh or rest a knee on the surface you are reaching across. This will provide you more stability, so that your muscles do not tire as quickly. By keeping your knees relaxed, or slightly bent, you will also reduce stress across your lower back. CORRECT LIFTING TECHNIQUES  DO : Assume a wide stance. This will provide you more stability and the opportunity to get as close as possible to the object which you are lifting. Tense your abdominals to brace your spine. Bend at the knees and hips. Keeping your  back locked in a neutral-spine position, lift using your leg muscles. Lift with your legs, keeping your back straight. Test the weight of unknown objects before attempting to lift them. Try to keep your elbows locked down at your sides in order get the best strength from your shoulders when carrying an object.   Always ask for help when lifting heavy or awkward objects. INCORRECT LIFTING TECHNIQUES DO NOT:  Lock your knees when lifting, even if it is a small object. Bend and twist. Pivot at your feet or move your feet when needing to change directions. Assume that you can safely pick up even a paperclip without proper posture.

## 2023-05-25 NOTE — Progress Notes (Signed)
Chief Complaint  Patient presents with   Hospitalization Follow-up    Hospital Follow up    Subjective: Patient is a 67 y.o. male here for hosp f/u.  Admitted on 05/16/23, dc'd on 05/18/23. Had R back pain and RLQ abd pain. Initially thought to be an intussusception. Resolved by next day, did not have surgery. Hurts when he moves. Has not had good bowel movement yet which is significantly a moderate amount of stool was noted on CT imaging.  He denies any injury or change in activity.  There is no bruising, redness, or swelling.  Past Medical History:  Diagnosis Date   Anginal pain (HCC)    Anxiety    Arthritis    Coronary artery disease, non-occlusive 08/2013   Mild to moderate (40% OM1) single vessel CAD. Otherwise nonobstructed.   Depression    Dyslipidemia, goal LDL below 100    Essential hypertension    Family history of premature CAD    GERD (gastroesophageal reflux disease)    H/O skin disorder    Involving hands. Subsequently resolved.    Nonischemic cardiomyopathy (HCC) 05/2013   Non-ischemic: EF ~45% by Echo & Myoview --> Non-obstructive CAD   Obesity (BMI 30.0-34.9)    OSA (obstructive sleep apnea) 06/21/2017   uses CPAP   Sleep apnea     Objective: BP 128/74 (BP Location: Left Arm, Patient Position: Sitting, Cuff Size: Normal)   Pulse 66   Temp 98 F (36.7 C) (Oral)   Resp 16   Ht 5\' 11"  (1.803 m)   Wt 177 lb 6.4 oz (80.5 kg)   SpO2 100%   BMI 24.74 kg/m  General: Awake, appears stated age Heart: RRR, no LE edema Lungs: CTAB, no rales, wheezes or rhonchi. No accessory muscle use Abdomen: Bowel sounds present, soft, nontender, nondistended. MSK: TTP over the lateral right oblique and erector spinae muscle group.  No midline tenderness.  Poor hamstring range of motion bilaterally. Neuro: DTRs equal and symmetric throughout, no clonus, no cerebellar signs, 5/5 strength throughout the lower extremities.  Gait is normal. Psych: Age appropriate judgment and  insight, normal affect and mood  Assessment and Plan: Acute right-sided low back pain without sciatica - Plan: tiZANidine (ZANAFLEX) 4 MG tablet  Hypocalcemia - Plan: CBC, Comprehensive metabolic panel  Stretches and exercises, heat, ice, Tylenol, Zanaflex as needed.  Warnings about drowsiness verbalized and written down.  If no improvement in the next several weeks, will refer to physical therapy. Follow-up on inpatient labs. The patient voiced understanding and agreement to the plan.  I spent 32 minutes with the patient discussing the above plans in addition to reviewing his chart/recent hospitalization on the same day of visit.  Jilda Roche [place], DO 05/25/23  2:08 PM

## 2023-05-29 NOTE — Transitions of Care (Post Inpatient/ED Visit) (Signed)
   05/29/2023  Name: Charles Grimes MRN: [id number] DOB: [birth year]  Today's TOC FU Call Status: Today's TOC FU Call Status:: Unsuccessful Call (3rd Attempt) Unsuccessful Call (1st Attempt) Date: 05/21/23 Unsuccessful Call (2nd Attempt) Date: 05/24/23 Unsuccessful Call (3rd Attempt) Date: 05/29/23  Attempted to reach the patient regarding the most recent Inpatient/ED visit.  Follow Up Plan: No further outreach attempts will be made at this time. We have been unable to contact the patient.  Signature Karena Addison, LPN [facility] Nurse Health Advisor Direct Dial [phone]

## 2023-05-30 DIAGNOSIS — H903 Sensorineural hearing loss, bilateral: Secondary | ICD-10-CM | POA: Diagnosis not present

## 2023-06-04 ENCOUNTER — Ambulatory Visit (INDEPENDENT_AMBULATORY_CARE_PROVIDER_SITE_OTHER): Payer: Medicare HMO | Admitting: Family Medicine

## 2023-06-04 ENCOUNTER — Encounter: Payer: Self-pay | Admitting: Family Medicine

## 2023-06-04 VITALS — BP 126/72 | HR 74 | Temp 98.0°F | Resp 16 | Ht 71.0 in | Wt 182.2 lb

## 2023-06-04 DIAGNOSIS — M25531 Pain in right wrist: Secondary | ICD-10-CM

## 2023-06-04 DIAGNOSIS — H6120 Impacted cerumen, unspecified ear: Secondary | ICD-10-CM

## 2023-06-04 DIAGNOSIS — K219 Gastro-esophageal reflux disease without esophagitis: Secondary | ICD-10-CM | POA: Insufficient documentation

## 2023-06-04 MED ORDER — COLCHICINE 0.6 MG PO TABS: ORAL_TABLET | ORAL | 1 refills | Status: DC

## 2023-06-04 MED ORDER — OMEPRAZOLE 20 MG PO CPDR: 20.0000 mg | DELAYED_RELEASE_CAPSULE | Freq: Every day | ORAL | 3 refills | Status: DC

## 2023-06-04 NOTE — Patient Instructions (Addendum)
Give [place] 2-3 business days to get the results of your labs back.   Ice/cold pack over area for 10-15 min twice daily.  OK to take Tylenol 1000 mg (2 extra strength tabs) or 975 mg (3 regular strength tabs) every 6 hours as needed.  OK to use Debrox (peroxide) in the ear to loosen up wax. Also recommend using a bulb syringe (for removing boogers from baby's noses) to flush through warm water and vinegar (3-4:1 ratio). An alternative, though more expensive, is an elephant ear washer wax removal kit. Do not use Q-tips as this can impact wax further.  Foods that may reduce pain: 1) Ginger 2) Blueberries 3) Salmon 4) Pumpkin seeds 5) Dark chocolate 6) Turmeric 7) Tart cherries 8) Virgin olive oil 9) Chili peppers 10) Mint 11) Krill oil  Let us know if you need anything.

## 2023-06-04 NOTE — Progress Notes (Signed)
Chief Complaint  Patient presents with   Ear Cleaning    Discuss Ear Cleaning    Subjective: Patient is a 67 y.o. male here for an ear cleaning.  Patient is getting fitted for hearing aids and was told that he needs all wax removed from his ears.  Hearing is largely unchanged and he notices no pain or discharge from his ear.  He does not use Q-tips.  Patient has a history of reflux.  He takes omeprazole 20 mg daily over-the-counter which does work well.  He is requesting a refill via prescription.  The patient has a 2-year history of intermittent joint pain.  He used to be 1 every 8 red meat, he would have redness, swelling, and pain of his elbows, wrists, and fingers.  Sometimes it affects his toes.  Now it seems any sort of meat he eats will cause this.  No obvious trauma.  Ibuprofen does help.  Past Medical History:  Diagnosis Date   Anginal pain (HCC)    Anxiety    Arthritis    Coronary artery disease, non-occlusive 08/2013   Mild to moderate (40% OM1) single vessel CAD. Otherwise nonobstructed.   Depression    Dyslipidemia, goal LDL below 100    Essential hypertension    Family history of premature CAD    GERD (gastroesophageal reflux disease)    H/O skin disorder    Involving hands. Subsequently resolved.    Nonischemic cardiomyopathy (HCC) 05/2013   Non-ischemic: EF ~45% by Echo & Myoview --> Non-obstructive CAD   Obesity (BMI 30.0-34.9)    OSA (obstructive sleep apnea) 06/21/2017   uses CPAP   Sleep apnea     Objective: BP 126/72 (BP Location: Left Arm, Patient Position: Sitting, Cuff Size: Normal)   Pulse 74   Temp 98 F (36.7 C) (Oral)   Resp 16   Ht 5\' 11"  (1.803 m)   Wt 182 lb 3.2 oz (82.6 kg)   SpO2 95%   BMI 25.41 kg/m  General: Awake, appears stated age Ears: Left canal is 90% patent, 10% obstructed with cerumen.  TM unremarkable.  Right canal is under percent patent, TM negative. Lungs: No accessory muscle use MSK: Edema over the lateral epicondyle  with TTP.  Slight warmth.  Over the right dorsum of the wrist medially, there is also warmth, edema, and TTP. 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  Check uric acid and alpha gal panel.  Asymmetric nature makes autoimmune issues less likely.  Will also trial colchicine for flares.  Ice, Tylenol otherwise. Omeprazole 20 mg daily sent in. Irrigation today.  Home care instructions verbalized and written down. The patient voiced understanding and agreement to the plan.  Jilda Roche [place], DO 06/04/23  3:26 PM

## 2023-06-05 LAB — URIC ACID: Uric Acid, Serum: 5.2 mg/dL (ref 4.0–7.8)

## 2023-06-07 LAB — INTERPRETATION:

## 2023-06-07 LAB — ALPHA-GAL PANEL
Allergen, Mutton, f88: 0.13 kU/L — ABNORMAL HIGH
Allergen, Pork, f26: 0.1 kU/L — ABNORMAL HIGH
Beef: 0.33 kU/L — ABNORMAL HIGH
GALACTOSE-ALPHA-1,3-GALACTOSE IGE*: 1.29 kU/L — ABNORMAL HIGH (ref <0.10)

## 2023-06-08 ENCOUNTER — Other Ambulatory Visit: Payer: Self-pay | Admitting: Family Medicine

## 2023-06-08 DIAGNOSIS — Z91018 Allergy to other foods: Secondary | ICD-10-CM

## 2023-06-27 ENCOUNTER — Other Ambulatory Visit: Payer: Self-pay | Admitting: Family Medicine

## 2023-06-27 DIAGNOSIS — H903 Sensorineural hearing loss, bilateral: Secondary | ICD-10-CM | POA: Diagnosis not present

## 2023-07-06 ENCOUNTER — Encounter (HOSPITAL_BASED_OUTPATIENT_CLINIC_OR_DEPARTMENT_OTHER): Payer: Self-pay | Admitting: Emergency Medicine

## 2023-07-06 ENCOUNTER — Emergency Department (HOSPITAL_BASED_OUTPATIENT_CLINIC_OR_DEPARTMENT_OTHER)
Admission: EM | Admit: 2023-07-06 | Discharge: 2023-07-07 | Disposition: A | Discharge status: Home / Self Care | Payer: Medicare HMO | Attending: Emergency Medicine | Admitting: Emergency Medicine

## 2023-07-06 ENCOUNTER — Emergency Department (HOSPITAL_BASED_OUTPATIENT_CLINIC_OR_DEPARTMENT_OTHER): Payer: Medicare HMO

## 2023-07-06 DIAGNOSIS — I10 Essential (primary) hypertension: Secondary | ICD-10-CM | POA: Insufficient documentation

## 2023-07-06 DIAGNOSIS — M7989 Other specified soft tissue disorders: Secondary | ICD-10-CM | POA: Diagnosis not present

## 2023-07-06 DIAGNOSIS — Z79899 Other long term (current) drug therapy: Secondary | ICD-10-CM | POA: Diagnosis not present

## 2023-07-06 DIAGNOSIS — Z7982 Long term (current) use of aspirin: Secondary | ICD-10-CM | POA: Diagnosis not present

## 2023-07-06 DIAGNOSIS — M79671 Pain in right foot: Secondary | ICD-10-CM | POA: Diagnosis not present

## 2023-07-06 DIAGNOSIS — M79672 Pain in left foot: Secondary | ICD-10-CM | POA: Diagnosis not present

## 2023-07-06 DIAGNOSIS — I2510 Atherosclerotic heart disease of native coronary artery without angina pectoris: Secondary | ICD-10-CM | POA: Diagnosis not present

## 2023-07-06 DIAGNOSIS — R600 Localized edema: Secondary | ICD-10-CM | POA: Insufficient documentation

## 2023-07-06 DIAGNOSIS — R0602 Shortness of breath: Secondary | ICD-10-CM | POA: Diagnosis not present

## 2023-07-06 LAB — CBC WITH DIFFERENTIAL/PLATELET
Abs Immature Granulocytes: 0.03 10*3/uL (ref 0.00–0.07)
Basophils Absolute: 0 10*3/uL (ref 0.0–0.1)
Basophils Relative: 0 %
Eosinophils Absolute: 0.1 10*3/uL (ref 0.0–0.5)
Eosinophils Relative: 1 %
HCT: 37.8 % — ABNORMAL LOW (ref 39.0–52.0)
Hemoglobin: 12.3 g/dL — ABNORMAL LOW (ref 13.0–17.0)
Immature Granulocytes: 0 %
Lymphocytes Relative: 19 %
Lymphs Abs: 2 10*3/uL (ref 0.7–4.0)
MCH: 28.1 pg (ref 26.0–34.0)
MCHC: 32.5 g/dL (ref 30.0–36.0)
MCV: 86.3 fL (ref 80.0–100.0)
Monocytes Absolute: 0.6 10*3/uL (ref 0.1–1.0)
Monocytes Relative: 6 %
Neutro Abs: 7.4 10*3/uL (ref 1.7–7.7)
Neutrophils Relative %: 74 %
Platelets: 158 10*3/uL (ref 150–400)
RBC: 4.38 MIL/uL (ref 4.22–5.81)
RDW: 14.8 % (ref 11.5–15.5)
WBC: 10.1 10*3/uL (ref 4.0–10.5)
nRBC: 0 % (ref 0.0–0.2)

## 2023-07-06 MED ORDER — HYDROCODONE-ACETAMINOPHEN 5-325 MG PO TABS
1.0000 | ORAL_TABLET | Freq: Once | ORAL | Status: AC
  Administered 2023-07-07: 1 via ORAL
  Filled 2023-07-06: qty 1

## 2023-07-06 NOTE — ED Provider Notes (Signed)
[facility] Provider Note   CSN: [id number] Arrival date & time: 07/06/23  2311     History {Add pertinent medical, surgical, social history, OB history to HPI:1} Chief Complaint  Patient presents with   Leg Swelling    Charles Grimes is a 67 y.o. male.  67 year old male with medical history significant for CAD, depression, HTN, GERD, OSA, non ischemic cardiomyopathy, lumbar laminectomy presents with a 3-day history of bilateral lower extremity swelling and pain.  Also had some swelling to his right arm which is since improved.  Feels both legs are swollen up to his knees bilaterally but left hurts more than right.  No fall or injury.  Denies shortness of breath.  Denies cough or fever.  Denies chest pain.  Denies any history of CHF, DVT or PE.  No fever.  States he sleeps in a reclined position normally and is able to sleep flat at baseline.  Denies history of CHF and does not take any diuretics at home.  Last EF in 2014 was 45 to 50% with diastolic dysfunction.  Does not feel short of breath at rest.  The history is provided by the patient and a relative.       Home Medications Prior to Admission medications   Medication Sig Start Date End Date Taking? Authorizing Provider  ALPRAZolam Prudy Feeler) 0.5 MG tablet Take 0.5-1 tablets (0.25-0.5 mg total) by mouth 2 (two) times daily as needed for anxiety. 07/18/22   Sharlene Dory, DO  aspirin EC 81 MG tablet Take 81 mg by mouth daily.    [provider]  atorvastatin (LIPITOR) 10 MG tablet Take 1 tablet (10 mg total) by mouth daily. 04/10/23   Sharlene Dory, DO  colchicine 0.6 MG tablet Take 2 tabs at first sign of a flare and repeat 1 tab 1 hr later. Take 1 tab twice daily until flare resolves. 06/04/23   Sharlene Dory, DO  lisinopril (ZESTRIL) 10 MG tablet Take 1 tablet by mouth once daily 05/25/23   Wendling, Jilda Roche, DO  nitroGLYCERIN (NITROSTAT) 0.4  MG SL tablet Place 1 tablet (0.4 mg total) under the tongue every 5 (five) minutes as needed for chest pain. 02/08/21   Sharlene Dory, DO  omeprazole (PRILOSEC) 20 MG capsule Take 1 capsule (20 mg total) by mouth daily. 06/04/23   Sharlene Dory, DO  sertraline (ZOLOFT) 100 MG tablet Take 1 tablet by mouth once daily 06/27/23   Sharlene Dory, DO  sertraline (ZOLOFT) 50 MG tablet Take 1 tablet by mouth once daily 06/27/23   Carmelia Roller, Jilda Roche, DO  tiZANidine (ZANAFLEX) 4 MG tablet Take 1 tablet (4 mg total) by mouth every 6 (six) hours as needed for muscle spasms. 05/25/23   Sharlene Dory, DO  triamcinolone cream (KENALOG) 0.1 % Apply 1 Application topically 2 (two) times daily. Patient taking differently: Apply 1 Application topically 2 (two) times daily as needed (itch). 04/05/22   Sharlene Dory, DO      Allergies    Ibuprofen, Isosorbide nitrate, Oxycodone, and Penicillins    Review of Systems   Review of Systems  Constitutional:  Negative for activity change, appetite change and fever.  HENT:  Negative for congestion and rhinorrhea.   Respiratory:  Positive for shortness of breath. Negative for cough and chest tightness.   Cardiovascular:  Positive for leg swelling.  Gastrointestinal:  Negative for abdominal pain, nausea and vomiting.  Genitourinary:  Negative for dysuria and hematuria.  Skin:  Negative for rash.  Neurological:  Negative for dizziness, weakness and headaches.   all other systems are negative except as noted in the HPI and PMH.    Physical Exam Updated Vital Signs BP 112/65 (BP Location: Right Arm)   Pulse 80   Temp 98.4 F (36.9 C) (Oral)   Resp 16   Ht 5\' 11"  (1.803 m)   Wt 85.3 kg   SpO2 95%   BMI 26.22 kg/m  Physical Exam Vitals and nursing note reviewed.  Constitutional:      General: He is not in acute distress.    Appearance: He is well-developed.  HENT:     Head: Normocephalic and atraumatic.      Mouth/Throat:     Pharynx: No oropharyngeal exudate.  Eyes:     Conjunctiva/sclera: Conjunctivae normal.     Pupils: Pupils are equal, round, and reactive to light.  Neck:     Comments: No meningismus. Cardiovascular:     Rate and Rhythm: Normal rate and regular rhythm.     Heart sounds: Normal heart sounds. No murmur heard. Pulmonary:     Effort: Pulmonary effort is normal. No respiratory distress.     Breath sounds: Normal breath sounds.  Abdominal:     Palpations: Abdomen is soft.     Tenderness: There is no abdominal tenderness. There is no guarding or rebound.  Musculoskeletal:        General: Swelling present. No tenderness. Normal range of motion.     Cervical back: Normal range of motion and neck supple.     Comments: +2 pitting edema to knees bilaterally.  Intact DP and PT pulse.  No calf tenderness or asymmetry.  Skin:    General: Skin is warm.  Neurological:     Mental Status: He is alert and oriented to person, place, and time.     Cranial Nerves: No cranial nerve deficit.     Motor: No abnormal muscle tone.     Coordination: Coordination normal.     Comments:  5/5 strength throughout. CN 2-12 intact.Equal grip strength.   Psychiatric:        Behavior: Behavior normal.     ED Results / Procedures / Treatments   Labs (all labs ordered are listed, but only abnormal results are displayed) Labs Reviewed  CBC WITH DIFFERENTIAL/PLATELET  COMPREHENSIVE METABOLIC PANEL  BRAIN NATRIURETIC PEPTIDE  TROPONIN I (HIGH SENSITIVITY)    EKG None  Radiology No results found.  Procedures Procedures  {Document cardiac monitor, telemetry assessment procedure when appropriate:1}  Medications Ordered in ED Medications  HYDROcodone-acetaminophen (NORCO/VICODIN) 5-325 MG per tablet 1 tablet (has no administration in time range)    ED Course/ Medical Decision Making/ A&P   {   Click here for ABCD2, HEART and other calculatorsREFRESH Note before signing :1}                               Medical Decision Making Amount and/or Complexity of Data Reviewed Independent Historian: caregiver Labs: ordered. Decision-making details documented in ED Course. Radiology: ordered and independent interpretation performed. Decision-making details documented in ED Course. ECG/medicine tests: ordered and independent interpretation performed. Decision-making details documented in ED Course.  Risk Prescription drug management.   3 days of bilateral leg pain and leg swelling.  Denies shortness of breath.  No increased work of breathing or hypoxia.  Clear lungs.  Check distal pulses.  No history of CHF.  Last echocardiogram 10 years ago showed mildly reduced ejection fraction of 45%.  {Document critical care time when appropriate:1} {Document review of labs and clinical decision tools ie heart score, Chads2Vasc2 etc:1}  {Document your independent review of radiology images, and any outside records:1} {Document your discussion with family members, caretakers, and with consultants:1} {Document social determinants of health affecting pt's care:1} {Document your decision making why or why not admission, treatments were needed:1} Final Clinical Impression(s) / ED Diagnoses Final diagnoses:  None    Rx / DC Orders ED Discharge Orders     None

## 2023-07-06 NOTE — ED Triage Notes (Signed)
BLLE and BLUE swelling for a few days that has progressively worsened. Denies shob. Hx of gout, but reports took colchicine this morning with no relief.

## 2023-07-07 ENCOUNTER — Other Ambulatory Visit (HOSPITAL_BASED_OUTPATIENT_CLINIC_OR_DEPARTMENT_OTHER): Payer: Medicare HMO

## 2023-07-07 ENCOUNTER — Emergency Department (HOSPITAL_BASED_OUTPATIENT_CLINIC_OR_DEPARTMENT_OTHER)
Admission: RE | Admit: 2023-07-07 | Discharge: 2023-07-07 | Disposition: A | Discharge status: Home / Self Care | Payer: Medicare HMO | Source: Ambulatory Visit | Attending: Emergency Medicine | Admitting: Emergency Medicine

## 2023-07-07 DIAGNOSIS — R0602 Shortness of breath: Secondary | ICD-10-CM | POA: Diagnosis not present

## 2023-07-07 DIAGNOSIS — M7989 Other specified soft tissue disorders: Secondary | ICD-10-CM | POA: Diagnosis not present

## 2023-07-07 DIAGNOSIS — R600 Localized edema: Secondary | ICD-10-CM | POA: Diagnosis not present

## 2023-07-07 DIAGNOSIS — M79671 Pain in right foot: Secondary | ICD-10-CM | POA: Diagnosis not present

## 2023-07-07 DIAGNOSIS — M79672 Pain in left foot: Secondary | ICD-10-CM | POA: Diagnosis not present

## 2023-07-07 LAB — COMPREHENSIVE METABOLIC PANEL
ALT: 19 U/L (ref 0–44)
AST: 24 U/L (ref 15–41)
Albumin: 3.1 g/dL — ABNORMAL LOW (ref 3.5–5.0)
Alkaline Phosphatase: 115 U/L (ref 38–126)
Anion gap: 8 (ref 5–15)
BUN: 16 mg/dL (ref 8–23)
CO2: 20 mmol/L — ABNORMAL LOW (ref 22–32)
Calcium: 8.2 mg/dL — ABNORMAL LOW (ref 8.9–10.3)
Chloride: 110 mmol/L (ref 98–111)
Creatinine, Ser: 0.88 mg/dL (ref 0.61–1.24)
GFR, Estimated: >60 mL/min (ref >60)
Glucose, Bld: 93 mg/dL (ref 70–99)
Potassium: 3.5 mmol/L (ref 3.5–5.1)
Sodium: 138 mmol/L (ref 135–145)
Total Bilirubin: 0.6 mg/dL (ref <1.2)
Total Protein: 6.9 g/dL (ref 6.5–8.1)

## 2023-07-07 LAB — TROPONIN I (HIGH SENSITIVITY)
Troponin I (High Sensitivity): 8 ng/L (ref <18)
Troponin I (High Sensitivity): 8 ng/L (ref <18)

## 2023-07-07 LAB — BRAIN NATRIURETIC PEPTIDE: B Natriuretic Peptide: 214.8 pg/mL — ABNORMAL HIGH (ref 0.0–100.0)

## 2023-07-07 LAB — LIPASE, BLOOD: Lipase: 27 U/L (ref 11–51)

## 2023-07-07 LAB — GROUP A STREP BY PCR: Group A Strep by PCR: NOT DETECTED

## 2023-07-07 MED ORDER — FUROSEMIDE 20 MG PO TABS: 20.0000 mg | ORAL_TABLET | Freq: Every day | ORAL | 0 refills | Status: DC

## 2023-07-07 MED ORDER — FUROSEMIDE 10 MG/ML IJ SOLN
40.0000 mg | Freq: Once | INTRAMUSCULAR | Status: AC
  Administered 2023-07-07: 40 mg via INTRAVENOUS
  Filled 2023-07-07: qty 4

## 2023-07-07 NOTE — ED Notes (Signed)
Pt ambulated around dept. SATS stayed between 92 - 98% no distress noted

## 2023-07-07 NOTE — Discharge Instructions (Addendum)
Take the Lasix as prescribed. Keep legs elevated. You may also purchase compression stockings from the pharmacy (after the ultrasound makes sure there are no blood clots).  Follow-up with your doctor next week for a recheck of your labs and an echocardiogram to assess the heart pumping function.  Return tomorrow for ultrasounds of your legs to rule out blood clots.  Return to the ED with difficulty breathing, chest pain, any other concerns.

## 2023-07-09 ENCOUNTER — Other Ambulatory Visit: Payer: Self-pay | Admitting: Family Medicine

## 2023-07-09 DIAGNOSIS — Z634 Disappearance and death of family member: Secondary | ICD-10-CM

## 2023-07-10 ENCOUNTER — Ambulatory Visit (INDEPENDENT_AMBULATORY_CARE_PROVIDER_SITE_OTHER): Payer: Medicare HMO | Admitting: Family Medicine

## 2023-07-10 ENCOUNTER — Encounter: Payer: Self-pay | Admitting: Family Medicine

## 2023-07-10 VITALS — BP 128/76 | HR 54 | Temp 98.0°F | Resp 16 | Ht 71.0 in | Wt 183.4 lb

## 2023-07-10 DIAGNOSIS — M25512 Pain in left shoulder: Secondary | ICD-10-CM

## 2023-07-10 DIAGNOSIS — J810 Acute pulmonary edema: Secondary | ICD-10-CM

## 2023-07-10 DIAGNOSIS — G8929 Other chronic pain: Secondary | ICD-10-CM | POA: Insufficient documentation

## 2023-07-10 DIAGNOSIS — M25511 Pain in right shoulder: Secondary | ICD-10-CM | POA: Diagnosis not present

## 2023-07-10 DIAGNOSIS — R0601 Orthopnea: Secondary | ICD-10-CM

## 2023-07-10 LAB — BASIC METABOLIC PANEL
BUN: 22 mg/dL (ref 6–23)
CO2: 28 meq/L (ref 19–32)
Calcium: 8.7 mg/dL (ref 8.4–10.5)
Chloride: 104 meq/L (ref 96–112)
Creatinine, Ser: 0.83 mg/dL (ref 0.40–1.50)
GFR: 90.91 mL/min (ref >60.00)
Glucose, Bld: 94 mg/dL (ref 70–99)
Potassium: 3.9 meq/L (ref 3.5–5.1)
Sodium: 139 meq/L (ref 135–145)

## 2023-07-10 MED ORDER — POTASSIUM CHLORIDE ER 10 MEQ PO TBCR: 10.0000 meq | EXTENDED_RELEASE_TABLET | Freq: Every day | ORAL | 0 refills | Status: DC

## 2023-07-10 MED ORDER — FUROSEMIDE 20 MG PO TABS: 20.0000 mg | ORAL_TABLET | Freq: Every day | ORAL | 0 refills | Status: DC

## 2023-07-10 MED ORDER — TIZANIDINE HCL 4 MG PO TABS: 4.0000 mg | ORAL_TABLET | Freq: Every evening | ORAL | 2 refills | Status: DC | PRN

## 2023-07-10 NOTE — Patient Instructions (Addendum)
Give us 2-3 business days to get the results of your labs back.   Keep the diet clean and stay active.  Aim to do some physical exertion for 150 minutes per week. This is typically divided into 5 days per week, 30 minutes per day. The activity should be enough to get your heart rate up. Anything is better than nothing if you have time constraints.  For the swelling in your lower extremities, be sure to elevate your legs when able, mind the salt intake, stay physically active and consider wearing compression stockings.  Let us know if you need anything. 

## 2023-07-10 NOTE — Progress Notes (Signed)
Chief Complaint  Patient presents with   Hospitalization Follow-up    Hospitalization follow up    Subjective: Patient is a 67 y.o. male here for ED f/u.  Went to ED on 12/20 for swelling. Pulm edema noted on CXR. Started on Lasix 20 mg/d. Reports he urinates 15 min after taking med. Swelling has since improved.  He is breathing better as well. He does not take any supplemental potassium.  Diet is improving.  He tries to stay active.  Last echo was over 10 years ago.  It showed mild diastolic dysfunction and slightly reduced ejection fraction of 45 to 50%.  Patient has a history of chronic bilateral shoulder pain.  He took some leftover Zanaflex 4 mg at night which seemed to help in both sleep and experience less pain.  He is requesting a refill.  He has no adverse effects with this medication.  He does not take it during the day.  Past Medical History:  Diagnosis Date   Anginal pain (HCC)    Anxiety    Arthritis    Coronary artery disease, non-occlusive 08/2013   Mild to moderate (40% OM1) single vessel CAD. Otherwise nonobstructed.   Depression    Dyslipidemia, goal LDL below 100    Essential hypertension    Family history of premature CAD    GERD (gastroesophageal reflux disease)    H/O skin disorder    Involving hands. Subsequently resolved.    Nonischemic cardiomyopathy (HCC) 05/2013   Non-ischemic: EF ~45% by Echo & Myoview --> Non-obstructive CAD   Obesity (BMI 30.0-34.9)    OSA (obstructive sleep apnea) 06/21/2017   uses CPAP   Sleep apnea     Objective: BP 128/76   Pulse (!) 54   Temp 98 F (36.7 C) (Oral)   Resp 16   Ht 5\' 11"  (1.803 m)   Wt 183 lb 6.4 oz (83.2 kg)   SpO2 95%   BMI 25.58 kg/m  General: Awake, appears stated age Heart: Reg rhythm, bradycardic, 2+ pitting bilateral LE edema tapering at the distal third of the tibia MSK: No TTP over the calves bilaterally Lungs:  bilateral crackles at the bases, no wheezes or rhonchi. No accessory muscle  use Psych: Age appropriate judgment and insight, normal affect and mood  Assessment and Plan: Orthopnea - Plan: Basic metabolic panel, ECHOCARDIOGRAM COMPLETE, furosemide (LASIX) 20 MG tablet, potassium chloride (KLOR-CON 10) 10 MEQ tablet  Acute pulmonary edema (HCC) - Plan: Basic metabolic panel, furosemide (LASIX) 20 MG tablet  Chronic pain of both shoulders - Plan: tiZANidine (ZANAFLEX) 4 MG tablet, potassium chloride (KLOR-CON 10) 10 MEQ tablet  1/2. Echo. Ck labs. Refill Lasix 20 mg/d. Add K+ with it. Cards referral pending results.  Follow-up in 1 month to recheck. 3. Does well with Zanaflex 4 mg nightly prn.  The patient voiced understanding and agreement to the plan.  Jilda Roche Iago, DO 07/10/23  12:57 PM

## 2023-07-19 ENCOUNTER — Other Ambulatory Visit: Payer: Self-pay | Admitting: Family Medicine

## 2023-07-19 DIAGNOSIS — R0609 Other forms of dyspnea: Secondary | ICD-10-CM

## 2023-07-19 DIAGNOSIS — R0601 Orthopnea: Secondary | ICD-10-CM

## 2023-07-19 DIAGNOSIS — G8929 Other chronic pain: Secondary | ICD-10-CM

## 2023-07-19 DIAGNOSIS — J810 Acute pulmonary edema: Secondary | ICD-10-CM

## 2023-07-27 ENCOUNTER — Other Ambulatory Visit: Payer: Self-pay

## 2023-07-27 MED ORDER — LISINOPRIL 10 MG PO TABS: 10.0000 mg | ORAL_TABLET | Freq: Every day | ORAL | 0 refills | Status: DC

## 2023-08-13 ENCOUNTER — Ambulatory Visit (HOSPITAL_BASED_OUTPATIENT_CLINIC_OR_DEPARTMENT_OTHER)
Admission: RE | Admit: 2023-08-13 | Discharge: 2023-08-13 | Disposition: A | Discharge status: Home / Self Care | Payer: Medicare HMO | Source: Ambulatory Visit | Attending: Family Medicine | Admitting: Family Medicine

## 2023-08-13 DIAGNOSIS — R0609 Other forms of dyspnea: Secondary | ICD-10-CM

## 2023-08-13 DIAGNOSIS — R0601 Orthopnea: Secondary | ICD-10-CM | POA: Diagnosis not present

## 2023-08-14 ENCOUNTER — Encounter: Payer: Self-pay | Admitting: Family Medicine

## 2023-08-14 LAB — ECHOCARDIOGRAM COMPLETE
AR max vel: 4.03 cm2
AV Area VTI: 3.87 cm2
AV Area mean vel: 3.79 cm2
AV Mean grad: 2 mm[Hg]
AV Peak grad: 4.4 mm[Hg]
AV Vena cont: 0.3 cm
Ao pk vel: 1.05 m/s
Area-P 1/2: 2.93 cm2
Calc EF: 56.4 %
MV M vel: 4.03 m/s
MV Peak grad: 64.8 mm[Hg]
P 1/2 time: 1392 ms
S' Lateral: 3.8 cm
Single Plane A2C EF: 55 %
Single Plane A4C EF: 57.6 %

## 2023-10-02 ENCOUNTER — Ambulatory Visit (INDEPENDENT_AMBULATORY_CARE_PROVIDER_SITE_OTHER): Payer: Medicare HMO

## 2023-10-02 VITALS — Ht 71.0 in | Wt 183.0 lb

## 2023-10-02 DIAGNOSIS — Z0000 Encounter for general adult medical examination without abnormal findings: Secondary | ICD-10-CM

## 2023-10-02 NOTE — Progress Notes (Signed)
 Subjective:   Charles Grimes is a 68 y.o. who presents for a Medicare Wellness preventive visit.  Visit Complete: Virtual I connected with  Charles Grimes on 10/02/23 by a audio enabled telemedicine application and verified that I am speaking with the correct person using two identifiers.  Patient Location: Home  Provider Location: Home Office  I discussed the limitations of evaluation and management by telemedicine. The patient expressed understanding and agreed to proceed.  Vital Signs: Because this visit was a virtual/telehealth visit, some criteria may be missing or patient reported. Any vitals not documented were not able to be obtained and vitals that have been documented are patient reported.  VideoDeclined- This patient declined Librarian, academic. Therefore the visit was completed with audio only.  Persons Participating in Visit: Patient.  AWV Questionnaire: No: Patient Medicare AWV questionnaire was not completed prior to this visit.  Cardiac Risk Factors include: advanced age (>88men, >13 women);male gender;dyslipidemia;hypertension     Objective:    Today's Vitals   10/02/23 0852  Weight: 183 lb (83 kg)  Height: 5\' 11"  (1.803 m)   Body mass index is 25.52 kg/m.     10/02/2023    8:59 AM 07/06/2023   11:20 PM 05/16/2023    8:12 PM 09/28/2022    9:41 AM 06/28/2021    3:45 PM 03/17/2021    4:58 PM 01/22/2020   11:42 AM  Advanced Directives  Does Patient Have a Medical Advance Directive? Yes No No Yes No No No  Type of Estate agent of [place];Living will   Healthcare Power of [place];Living will     Does patient want to make changes to medical advance directive?    No - Patient declined     Copy of Healthcare Power of Attorney in Chart? No - copy requested   No - copy requested     Would patient like information on creating a medical advance directive?   No - Patient declined  Yes (MAU/Ambulatory/Procedural  Areas - Information given) No - Patient declined     Current Medications (verified) Outpatient Encounter Medications as of 10/02/2023  Medication Sig   ALPRAZolam (XANAX) 0.5 MG tablet TAKE 1/2 TO 1 (ONE-HALF TO ONE) TABLET BY MOUTH TWICE DAILY AS NEEDED FOR ANXIETY   aspirin EC 81 MG tablet Take 81 mg by mouth daily.   atorvastatin (LIPITOR) 10 MG tablet Take 1 tablet (10 mg total) by mouth daily.   colchicine 0.6 MG tablet Take 2 tabs at first sign of a flare and repeat 1 tab 1 hr later. Take 1 tab twice daily until flare resolves.   furosemide (LASIX) 20 MG tablet Take 1 tablet (20 mg total) by mouth daily.   lisinopril (ZESTRIL) 10 MG tablet Take 1 tablet (10 mg total) by mouth daily.   nitroGLYCERIN (NITROSTAT) 0.4 MG SL tablet Place 1 tablet (0.4 mg total) under the tongue every 5 (five) minutes as needed for chest pain.   omeprazole (PRILOSEC) 20 MG capsule Take 1 capsule (20 mg total) by mouth daily.   potassium chloride (KLOR-CON 10) 10 MEQ tablet Take 1 tablet (10 mEq total) by mouth daily.   sertraline (ZOLOFT) 50 MG tablet Take 1 tablet by mouth once daily   tiZANidine (ZANAFLEX) 4 MG tablet Take 1 tablet (4 mg total) by mouth at bedtime as needed for muscle spasms.   triamcinolone cream (KENALOG) 0.1 % Apply 1 Application topically 2 (two) times daily. (Patient taking differently: Apply 1  Application topically 2 (two) times daily as needed (itch).)   No facility-administered encounter medications on file as of 10/02/2023.    Allergies (verified) Ibuprofen, Isosorbide nitrate, Oxycodone, and Penicillins   History: Past Medical History:  Diagnosis Date   Anginal pain (HCC)    Anxiety    Arthritis    Coronary artery disease, non-occlusive 08/2013   Mild to moderate (40% OM1) single vessel CAD. Otherwise nonobstructed.   Depression    Dyslipidemia, goal LDL below 100    Essential hypertension    Family history of premature CAD    GERD (gastroesophageal reflux disease)     H/O skin disorder    Involving hands. Subsequently resolved.    Nonischemic cardiomyopathy (HCC) 05/2013   Non-ischemic: EF ~45% by Echo & Myoview --> Non-obstructive CAD   Obesity (BMI 30.0-34.9)    OSA (obstructive sleep apnea) 06/21/2017   uses CPAP   Sleep apnea    Past Surgical History:  Procedure Laterality Date   COLONOSCOPY  2015   COLONOSCOPY WITH PROPOFOL  01/02/2017   Dr.Pyrtle   LEFT HEART CATHETERIZATION WITH CORONARY ANGIOGRAM  08/26/2013   Mild to moderate disease with 40-50% stenosis and OM1. Otherwise no significant CAD --  Surgeon: Marykay Lex, MD;  Location: [facility] CATH LAB;  Service: Cardiovascular;;   Lower extremity arterial Dopplers  06/11/2013   No occlusive disease   LUMBAR LAMINECTOMY/DECOMPRESSION MICRODISCECTOMY Right 10/07/2014   Procedure: LUMBAR LAMINECTOMY/DECOMPRESSION MICRODISCECTOMY 1 LEVEL;  Surgeon: Donalee Citrin, MD;  Location: [facility] NEURO ORS;  Service: Neurosurgery;  Laterality: Right;  Right L3-L4 Microdiscectomy   NM MYOVIEW LTD  06/03/2013   Low risk study with no ischemia. EF roughly 44% with no regional wall motion abnormalities noted   PFTs  06/16/2013   Normal Volumes &  Spirometry; Moderately reduced DLCO   POLYPECTOMY     SHOULDER HEMI-ARTHROPLASTY Right 05/28/2015   Procedure: RIGHT SHOULDER HEMI-ARTHROPLASTY CTA HEAD AND SUBSCAP REPAIR ;  Surgeon: Beverely Low, MD;  Location: [facility] OR;  Service: Orthopedics;  Laterality: Right;   TRANSTHORACIC ECHOCARDIOGRAM  06/11/2013   Mildly reduced EF: 45-50%. Mild anterior hypokinesis with incoordinate septal motion. No evidence of pulmonary hypertension   Family History  Problem Relation Age of Onset   Atrial fibrillation Mother        Alive at 41   COPD Mother    Hypertension Mother    Colonic polyp Mother    Hypertension Father        Alive at 69   Lung cancer Father        Chronic smoker   Factor V Leiden deficiency Father        Also Lupus anticoagulant   Heart attack Maternal Grandmother  48   Heart failure Paternal Grandmother    Diabetes Paternal Grandmother    Heart attack Brother 60       X2   Heart attack Sister 36       Deceased   Healthy Sister        x2   Healthy Son        x2   Colon cancer Neg Hx    Esophageal cancer Neg Hx    Rectal cancer Neg Hx    Stomach cancer Neg Hx    Social History   Socioeconomic History   Marital status: Married    Spouse name: Not on file   Number of children: Not on file   Years of education: Not on file   Highest education level:  Not on file  Occupational History   Not on file  Tobacco Use   Smoking status: Every Day    Current packs/day: 1.00    Average packs/day: 1 pack/day for 42.0 years (42.0 ttl pk-yrs)    Types: Cigarettes   Smokeless tobacco: Never  Vaping Use   Vaping status: Former   Substances: Flavoring  Substance and Sexual Activity   Alcohol use: Not Currently    Comment: rarely   Drug use: No   Sexual activity: Yes    Partners: Female  Other Topics Concern   Not on file  Social History Narrative   Married father of 2 boys. Lives with wife, Albin Felling, and one son.   He works as a Systems developer for [employer]   He currently smokes one pack a day, cut down from 2 packs per day.   Only occasional beer and mixed drinks.   Does not exercise because he is too tired.   Social Drivers of Corporate investment banker Strain: Low Risk  (10/02/2023)   Overall Financial Resource Strain (CARDIA)    Difficulty of Paying Living Expenses: Not hard at all  Food Insecurity: No Food Insecurity (10/02/2023)   Hunger Vital Sign    Worried About Running Out of Food in the Last Year: Never true    Ran Out of Food in the Last Year: Never true  Transportation Needs: No Transportation Needs (10/02/2023)   PRAPARE - Administrator, Civil Service (Medical): No    Lack of Transportation (Non-Medical): No  Physical Activity: Inactive (10/02/2023)   Exercise Vital  Sign    Days of Exercise per Week: 0 days    Minutes of Exercise per Session: 0 min  Stress: No Stress Concern Present (10/02/2023)   [employer] of Occupational Health - Occupational Stress Questionnaire    Feeling of Stress : Not at all  Social Connections: Socially Isolated (10/02/2023)   Social Connection and Isolation Panel [NHANES]    Frequency of Communication with Friends and Family: More than three times a week    Frequency of Social Gatherings with Friends and Family: More than three times a week    Attends Religious Services: Never    Database administrator or Organizations: No    Attends Banker Meetings: Never    Marital Status: Widowed    Tobacco Counseling Ready to quit: Yes Counseling given: Yes    Clinical Intake:  Pre-visit preparation completed: Yes  Pain : No/denies pain     BMI - recorded: 25.52 Nutritional Status: BMI 25 -29 Overweight Nutritional Risks: None Diabetes: No  How often do you need to have someone help you when you read instructions, pamphlets, or other written materials from your doctor or pharmacy?: 1 - Never  Interpreter Needed?: No  Information entered by :: Theresa Mulligan LPN   Activities of Daily Living     10/02/2023    8:58 AM 05/17/2023    5:39 AM  In your present state of health, do you have any difficulty performing the following activities:  Hearing? 0 1  Vision? 0 0  Difficulty concentrating or making decisions? 0 0  Walking or climbing stairs? 0   Dressing or bathing? 0   Doing errands, shopping? 0 0  Preparing Food and eating ? N   Using the Toilet? N   In the past six months, have you accidently leaked urine? N   Do you have  problems with loss of bowel control? N   Managing your Medications? N   Managing your Finances? N   Housekeeping or managing your Housekeeping? N     Patient Care Team: Sharlene Dory, DO as PCP - General (Family Medicine)  Indicate any recent Medical  Services you may have received from other than [facility] providers in the past year (date may be approximate).     Assessment:   This is a routine wellness examination for Charles Grimes.  Hearing/Vision screen Hearing Screening - Comments:: Wears Hearing Aids Vision Screening - Comments:: Wears rx glasses - up to date with routine eye exams with  [facility]   Goals Addressed               This Visit's Progress     Quit smoking / using tobacco (pt-stated)        My continued goal is to quit smoking.       Depression Screen      10/02/2023    8:56 AM 06/04/2023    3:05 PM 09/28/2022    9:43 AM 07/18/2022    2:58 PM 06/28/2021    3:49 PM 02/08/2021    1:24 PM 05/02/2016    4:19 PM  PHQ 2/9 Scores  PHQ - 2 Score 0 0 1 6 0 0 0  PHQ- 9 Score    12       Fall Risk     10/02/2023    8:58 AM 06/04/2023    3:05 PM 09/28/2022    9:40 AM 09/26/2022    3:42 PM 06/28/2021    3:47 PM  Fall Risk   Falls in the past year? 0 0 0 0 1  Number falls in past yr: 0 0 0 0 1  Injury with Fall? 0 0 0 0 0  Risk for fall due to : No Fall Risks  No Fall Risks  History of fall(s)  Follow up Falls prevention discussed;Falls evaluation completed Falls evaluation completed Falls evaluation completed  Falls prevention discussed    MEDICARE RISK AT HOME:  Medicare Risk at Home Any stairs in or around the home?: No If so, are there any without handrails?: No Home free of loose throw rugs in walkways, pet beds, electrical cords, etc?: Yes Adequate lighting in your home to reduce risk of falls?: Yes Life alert?: No Use of a cane, walker or w/c?: No Grab bars in the bathroom?: Yes Shower chair or bench in shower?: No Elevated toilet seat or a handicapped toilet?: No  TIMED UP AND GO:  Was the test performed?  No  Cognitive Function: 6CIT completed        10/02/2023    9:00 AM 09/28/2022    9:47 AM  6CIT Screen  What Year? 0 points 0 points  What month? 0 points 0 points  What time? 0 points  0 points  Count back from 20 0 points 4 points  Months in reverse 4 points 4 points  Repeat phrase 0 points 0 points  Total Score 4 points 8 points    Immunizations Immunization History  Administered Date(s) Administered   Fluad Quad(high Dose 65+) 08/16/2021   Fluad Trivalent(High Dose 65+) 04/10/2023   Influenza,inj,Quad PF,6+ Mos 06/10/2014, 06/20/2017, 04/15/2018, 04/06/2020   PFIZER(Purple Top)SARS-COV-2 Vaccination 04/06/2020, 04/27/2020   PNEUMOCOCCAL CONJUGATE-20 08/16/2021   Tdap 01/01/2014    Screening Tests Health Maintenance  Topic Date Due   Lung Cancer Screening  03/17/2022   COVID-19 Vaccine (3 -  2024-25 season) 10/08/2023 (Originally 03/18/2023)   Zoster Vaccines- Shingrix (1 of 2) 10/08/2023 (Originally 07/22/2006)   DTaP/Tdap/Td (2 - Td or Tdap) 01/02/2024   Colonoscopy  03/06/2024   Medicare Annual Wellness (AWV)  10/01/2024   Pneumonia Vaccine 42+ Years old  Completed   INFLUENZA VACCINE  Completed   Hepatitis C Screening  Completed   HPV VACCINES  Aged Out    Health Maintenance  Health Maintenance Due  Topic Date Due   Lung Cancer Screening  03/17/2022   Health Maintenance Items Addressed:   Additional Screening:  Vision Screening: Recommended annual ophthalmology exams for early detection of glaucoma and other disorders of the eye.  Dental Screening: Recommended annual dental exams for proper oral hygiene  Community Resource Referral / Chronic Care Management: CRR required this visit?  No   CCM required this visit?  No     Plan:     I have personally reviewed and noted the following in the patient's chart:   Medical and social history Use of alcohol, tobacco or illicit drugs  Current medications and supplements including opioid prescriptions. Patient is not currently taking opioid prescriptions. Functional ability and status Nutritional status Physical activity Advanced directives List of other physicians Hospitalizations,  surgeries, and ER visits in previous 12 months Vitals Screenings to include cognitive, depression, and falls Referrals and appointments  In addition, I have reviewed and discussed with patient certain preventive protocols, quality metrics, and best practice recommendations. A written personalized care plan for preventive services as well as general preventive health recommendations were provided to patient.     Tillie Rung, LPN   03/30/7828   After Visit Summary: (MyChart) Due to this being a telephonic visit, the after visit summary with patients personalized plan was offered to patient via MyChart   Notes: Nothing significant to report at this time.

## 2023-10-02 NOTE — Patient Instructions (Addendum)
 [name] , Thank you for taking time to come for your Medicare Wellness Visit. I appreciate your ongoing commitment to your health goals. Please review the following plan we discussed and let me know if I can assist you in the future.   Referrals/Orders/Follow-Ups/Clinician Recommendations:   This is a list of the screening recommended for you and due dates:  Health Maintenance  Topic Date Due   Screening for Lung Cancer  03/17/2022   COVID-19 Vaccine (3 - 2024-25 season) 10/08/2023*   Zoster (Shingles) Vaccine (1 of 2) 10/08/2023*   DTaP/Tdap/Td vaccine (2 - Td or Tdap) 01/02/2024   Colon Cancer Screening  03/06/2024   Medicare Annual Wellness Visit  10/01/2024   Pneumonia Vaccine  Completed   Flu Shot  Completed   Hepatitis C Screening  Completed   HPV Vaccine  Aged Out  *Topic was postponed. The date shown is not the original due date.    Advanced directives: (Copy Requested) Please bring a copy of your health care power of attorney and living will to the office to be added to your chart at your convenience. You can mail to [facility] [address] [place], [place] [address] or email to [email or web address]  Next Medicare Annual Wellness Visit scheduled for next year: Yes

## 2023-10-05 ENCOUNTER — Other Ambulatory Visit: Payer: Self-pay | Admitting: Family Medicine

## 2023-10-18 ENCOUNTER — Other Ambulatory Visit: Payer: Self-pay | Admitting: Family Medicine

## 2023-10-23 ENCOUNTER — Ambulatory Visit: Payer: Self-pay

## 2023-10-23 NOTE — Telephone Encounter (Signed)
  Chief Complaint: joint swelling Symptoms: hand and feet hurt on: [] ED /[] Urgent Care (no appt availability in office) / [x] Appointment(In office/virtual)/ []  [facility]/ [] Home Care/ [] Refused Recommended Disposition /[] [facility]/ []  Follow-up with PCP Additional Notes: Pt son Charles Grimes called with concerns of joint  pain/swelling in hands and feet.  Charles Grimes said this has been going on for nearly 6 months. These symptoms come and go. Pt has been taking OTC pain medication but gets no relief. RN gave care advice for Charles Grimes to relay to pt. Charles Grimes verbalized understanding. Pt has appt with PCP tomorrow at 1400.           Copied from CRM [id number]. Topic: Clinical - Red Word Triage >> Oct 23, 2023  4:26 PM Antwanette L wrote: Red Word that prompted transfer to Nurse Triage: Patient will have swellen in hands and feet. Reason for Disposition  MODERATE hand swelling (e.g., visible swelling of hand and fingers; pitting edema)  Answer Assessment - Initial Assessment Questions 1. ONSET: "When did the swelling start?" (e.g., minutes, hours, days)     6 months  2. LOCATION: "What part of the hand is swollen?"  "Are both hands swollen or just one hand?"     Both hands  3. SEVERITY: "How bad is the swelling?" (e.g., localized; mild, moderate, severe)   - BALL OR LUMP: small ball or lump   - LOCALIZED: puffy or swollen area or patch of skin   - JOINT SWELLING: swelling of a joint   - MILD: puffiness or mild swelling of fingers or hand   - MODERATE: fingers and hand are swollen   - SEVERE: swelling of entire hand and up into forearm     Joint-thumb  4. REDNESS: "Does the swelling look red or infected?"     Denies  5. PAIN: "Is the swelling painful to touch?" If Yes, ask: "How painful is it?"   (Scale 1-10; mild, moderate or severe)     Unsure  6. FEVER: "Do you have a fever?" If Yes, ask: "What is it, how was it measured, and when did it start?"      No  7. CAUSE: "What do you  think is causing the hand swelling?" (e.g., heat, insect bite, pregnancy, recent injury)     Not sure  8. MEDICAL HISTORY: "Do you have a history of heart failure, kidney disease, liver failure, or cancer?"     Low functioning heart  9. RECURRENT SYMPTOM: "Have you had hand swelling before?" If Yes, ask: "When was the last time?" "What happened that time?"     Na  10. OTHER SYMPTOMS: "Do you have any other symptoms?" (e.g., blurred vision, difficulty breathing, headache)       Joint pain all over body  Protocols used: Hand Swelling-A-AH

## 2023-10-24 ENCOUNTER — Ambulatory Visit (INDEPENDENT_AMBULATORY_CARE_PROVIDER_SITE_OTHER)
Admission: RE | Admit: 2023-10-24 | Discharge: 2023-10-24 | Disposition: A | Discharge status: Home / Self Care | Source: Ambulatory Visit | Attending: Family Medicine | Admitting: Family Medicine

## 2023-10-24 ENCOUNTER — Ambulatory Visit (INDEPENDENT_AMBULATORY_CARE_PROVIDER_SITE_OTHER): Admitting: Family Medicine

## 2023-10-24 ENCOUNTER — Encounter: Payer: Self-pay | Admitting: Family Medicine

## 2023-10-24 VITALS — BP 132/84 | HR 60 | Temp 98.4°F | Ht 71.0 in | Wt 191.0 lb

## 2023-10-24 DIAGNOSIS — G8929 Other chronic pain: Secondary | ICD-10-CM | POA: Diagnosis not present

## 2023-10-24 DIAGNOSIS — M25562 Pain in left knee: Secondary | ICD-10-CM | POA: Diagnosis not present

## 2023-10-24 DIAGNOSIS — M25561 Pain in right knee: Secondary | ICD-10-CM | POA: Diagnosis not present

## 2023-10-24 DIAGNOSIS — Z634 Disappearance and death of family member: Secondary | ICD-10-CM | POA: Diagnosis not present

## 2023-10-24 DIAGNOSIS — M654 Radial styloid tenosynovitis [de Quervain]: Secondary | ICD-10-CM

## 2023-10-24 DIAGNOSIS — M1711 Unilateral primary osteoarthritis, right knee: Secondary | ICD-10-CM | POA: Diagnosis not present

## 2023-10-24 MED ORDER — SERTRALINE HCL 50 MG PO TABS: 50.0000 mg | ORAL_TABLET | Freq: Every day | ORAL | 3 refills | Status: DC

## 2023-10-24 MED ORDER — ATORVASTATIN CALCIUM 10 MG PO TABS: 10.0000 mg | ORAL_TABLET | Freq: Every day | ORAL | 3 refills | Status: AC

## 2023-10-24 MED ORDER — PREDNISONE 20 MG PO TABS: 40.0000 mg | ORAL_TABLET | Freq: Every day | ORAL | 0 refills | Status: AC

## 2023-10-24 MED ORDER — OMEPRAZOLE 20 MG PO CPDR: 20.0000 mg | DELAYED_RELEASE_CAPSULE | Freq: Every day | ORAL | 0 refills | Status: DC

## 2023-10-24 MED ORDER — ALPRAZOLAM 0.5 MG PO TABS: 0.5000 mg | ORAL_TABLET | Freq: Every evening | ORAL | 0 refills | Status: DC | PRN

## 2023-10-24 NOTE — Patient Instructions (Addendum)
 Wear the braces at night and during aggravating activities.   OK to take Tylenol 1000 mg (2 extra strength tabs) or 975 mg (3 regular strength tabs) every 6 hours as needed.  Ice/cold pack over area for 10-15 min twice daily.  Heat (pad or rice pillow in microwave) over affected area, 10-15 minutes twice daily.   Please get your X-ray done in the basement of our [place] office located on: [address] [place], [place] [address]  You do not need an appointment for that location.   Let us know if you need anything.  Wrist and Forearm Exercises Do exercises exactly as told by your health care provider and adjust them as directed. It is normal to feel mild stretching, pulling, tightness, or discomfort as you do these exercises, but you should stop right away if you feel sudden pain or your pain gets worse.   RANGE OF MOTION EXERCISES These exercises warm up your muscles and joints and improve the movement and flexibility of your injured wrist and forearm. These exercises also help to relieve pain, numbness, and tingling. These exercises are done using the muscles in your injured wrist and forearm. Exercise A: Wrist Flexion, Active With your fingers relaxed, bend your wrist forward as far as you can. Hold this position for 30 seconds. Repeat 2 times. Complete this exercise 3 times per week. Exercise B: Wrist Extension, Active With your fingers relaxed, bend your wrist backward as far as you can. Hold this position for 30 seconds. Repeat 2 times. Complete this exercise 3 times per week. Exercise C: Supination, Active  Stand or sit with your arms at your sides. Bend your left / right elbow to an "L" shape (90 degrees). Turn your palm upward until you feel a gentle stretch on the inside of your forearm. Hold this position for 30 seconds. Slowly return your palm to the starting position. Repeat 2 times. Complete this exercise 3 times per week. Exercise D: Pronation, Active  Stand or sit with  your arms at your sides. Bend your left / right elbow to an "L" shape (90 degrees). Turn your palm downward until you feel a gentle stretch on the top of your forearm. Hold this position for 30 seconds. Slowly return your palm to the starting position. Repeat 2 times. Complete this exercise once a day.  STRETCHING EXERCISES These exercises warm up your muscles and joints and improve the movement and flexibility of your injured wrist and forearm. These exercises also help to relieve pain, numbness, and tingling. These exercises are done using your healthy wrist and forearm to help stretch the muscles in your injured wrist and forearm. Exercise E: Wrist Flexion, Passive  Extend your left / right arm in front of you, relax your wrist, and point your fingers downward. Gently push on the back of your hand. Stop when you feel a gentle stretch on the top of your forearm. Hold this position for 30 seconds. Repeat 2 times. Complete this exercise 3 times per week. Exercise F: Wrist Extension, Passive  Extend your left / right arm in front of you and turn your palm upward. Gently pull your palm and fingertips back so your fingers point downward. You should feel a gentle stretch on the palm-side of your forearm. Hold this position for 30 seconds. Repeat 2 times. Complete this exercise 3 times per week. Exercise G: Forearm Rotation, Supination, Passive Sit with your left / right elbow bent to an "L" shape (90 degrees) with your forearm  resting on a table. Keeping your upper body and shoulder still, use your other hand to rotate your forearm palm-up until you feel a gentle to moderate stretch. Hold this position for 30 seconds. Slowly release the stretch and return to the starting position. Repeat 2 times. Complete this exercise 3 times per week. Exercise H: Forearm Rotation, Pronation, Passive Sit with your left / right elbow bent to an "L" shape (90 degrees) with your forearm resting on a  table. Keeping your upper body and shoulder still, use your other hand to rotate your forearm palm-down until you feel a gentle to moderate stretch. Hold this position for 30 seconds. Slowly release the stretch and return to the starting position. Repeat 2 times. Complete this exercise 3 times per week.  STRENGTHENING EXERCISES These exercises build strength and endurance in your wrist and forearm. Endurance is the ability to use your muscles for a long time, even after they get tired. Exercise I: Wrist Flexors  Sit with your left / right forearm supported on a table and your hand resting palm-up over the edge of the table. Your elbow should be bent to an "L" shape (about 90 degrees) and be below the level of your shoulder. Hold a 3-5 lb weight in your left / right hand. Or, hold a rubber exercise band or tube in both hands, keeping your hands at the same level and hip distance apart. There should be a slight tension in the exercise band or tube. Slowly curl your hand up toward your forearm. Hold this position for 3 seconds. Slowly lower your hand back to the starting position. Repeat 2 times. Complete this exercise 3 times per week. Exercise J: Wrist Extensors  Sit with your left / right forearm supported on a table and your hand resting palm-down over the edge of the table. Your elbow should be bent to an "L" shape (about 90 degrees) and be below the level of your shoulder. Hold a 3-5 lb weight in your left / right hand. Or, hold a rubber exercise band or tube in both hands, keeping your hands at the same level and hip distance apart. There should be a slight tension in the exercise band or tube. Slowly curl your hand up toward your forearm. Hold this position for 3 seconds. Slowly lower your hand back to the starting position. Repeat 2 times. Complete this exercise 3 times per week. Exercise K: Forearm Rotation, Supination  Sit with your left / right forearm supported on a table and your  hand resting palm-down. Your elbow should be at your side, bent to an "L" shape (about 90 degrees), and below the level of your shoulder. Keep your wrist stable and in a neutral position throughout the exercise. Gently hold a lightweight hammer with your left / right hand. Without moving your elbow or wrist, slowly rotate your palm upward to a thumbs-up position. Hold this position for 3 seconds. Slowly return your forearm to the starting position. Repeat 2 times. Complete this exercise 3 times per week. Exercise L: Forearm Rotation, Pronation  Sit with your left / right forearm supported on a table and your hand resting palm-up. Your elbow should be at your side, bent to an "L" shape (about 90 degrees), and below the level of your shoulder. Keep your wrist stable. Do not allow it to move backward or forward during the exercise. Gently hold a lightweight hammer with your left / right hand. Without moving your elbow or wrist, slowly rotate your  palm and hand upward to a thumbs-up position. Hold this position for 3 seconds. Slowly return your forearm to the starting position. Repeat 2 times. Complete this exercise 3 times per week. Exercise M: Grip Strengthening  Hold one of these items in your left / right hand: play dough, therapy putty, a dense sponge, a stress ball, or a large, rolled sock. Squeeze as hard as you can without increasing pain. Hold this position for 5 seconds. Slowly release your grip. Repeat 2 times. Complete this exercise 3 times per week.  This information is not intended to replace advice given to you by your health care provider. Make sure you discuss any questions you have with your health care provider. Document Released: 05/17/2005 Document Revised: 03/27/2016 Document Reviewed: 03/28/2015 Elsevier Interactive Patient Education  2018 [employer].  Knee Exercises It is normal to feel mild stretching, pulling, tightness, or discomfort as you do these exercises, but  you should stop right away if you feel sudden pain or your pain gets worse.  STRETCHING AND RANGE OF MOTION EXERCISES  These exercises warm up your muscles and joints and improve the movement and flexibility of your knee. These exercises also help to relieve pain, numbness, and tingling. Exercise A: Knee Extension, Prone  Lie on your abdomen on a bed. Place your left / right knee just beyond the edge of the surface so your knee is not on the bed. You can put a towel under your left / right thigh just above your knee for comfort. Relax your leg muscles and allow gravity to straighten your knee. You should feel a stretch behind your left / right knee. Hold this position for 30 seconds. Scoot up so your knee is supported between repetitions. Repeat 2 times. Complete this stretch 3 times per week. Exercise B: Knee Flexion, Active     Lie on your back with both knees straight. If this causes back discomfort, bend your left / right knee so your foot is flat on the floor. Slowly slide your left / right heel back toward your buttocks until you feel a gentle stretch in the front of your knee or thigh. Hold this position for 30 seconds. Slowly slide your left / right heel back to the starting position. Repeat 2 times. Complete this exercise 3 times per week. Exercise C: Quadriceps, Prone     Lie on your abdomen on a firm surface, such as a bed or padded floor. Bend your left / right knee and hold your ankle. If you cannot reach your ankle or pant leg, loop a belt around your foot and grab the belt instead. Gently pull your heel toward your buttocks. Your knee should not slide out to the side. You should feel a stretch in the front of your thigh and knee. Hold this position for 30 seconds. Repeat 2 times. Complete this stretch 3 times per week. Exercise D: Hamstring, Supine  Lie on your back. Loop a belt or towel over the ball of your left / right foot. The ball of your foot is on the walking  surface, right under your toes. Straighten your left / right knee and slowly pull on the belt to raise your leg until you feel a gentle stretch behind your knee. Do not let your left / right knee bend while you do this. Keep your other leg flat on the floor. Hold this position for 30 seconds. Repeat 2 times. Complete this stretch 3 times per week. STRENGTHENING EXERCISES  These exercises  build strength and endurance in your knee. Endurance is the ability to use your muscles for a long time, even after they get tired. Exercise E: Quadriceps, Isometric     Lie on your back with your left / right leg extended and your other knee bent. Put a rolled towel or small pillow under your knee if told by your health care provider. Slowly tense the muscles in the front of your left / right thigh. You should see your kneecap slide up toward your hip or see increased dimpling just above the knee. This motion will push the back of the knee toward the floor. For 3 seconds, keep the muscle as tight as you can without increasing your pain. Relax the muscles slowly and completely. Repeat for 10 total reps Repeat 2 ti mes. Complete this exercise 3 times per week. Exercise F: Straight Leg Raises - Quadriceps  Lie on your back with your left / right leg extended and your other knee bent. Tense the muscles in the front of your left / right thigh. You should see your kneecap slide up or see increased dimpling just above the knee. Your thigh may even shake a bit. Keep these muscles tight as you raise your leg 4-6 inches (10-15 cm) off the floor. Do not let your knee bend. Hold this position for 3 seconds. Keep these muscles tense as you lower your leg. Relax your muscles slowly and completely after each repetition. 10 total reps. Repeat 2 times. Complete this exercise 3 times per week.  Exercise G: Hamstring Curls     If told by your health care provider, do this exercise while wearing ankle weights. Begin with 5  lb weights (optional). Then increase the weight by 1 lb (0.5 kg) increments. Do not wear ankle weights that are more than 20 lbs to start with. Lie on your abdomen with your legs straight. Bend your left / right knee as far as you can without feeling pain. Keep your hips flat against the floor. Hold this position for 3 seconds. Slowly lower your leg to the starting position. Repeat for 10 reps.  Repeat 2 times. Complete this exercise 3 times per week. Exercise H: Squats (Quadriceps)  Stand in front of a table, with your feet and knees pointing straight ahead. You may rest your hands on the table for balance but not for support. Slowly bend your knees and lower your hips like you are going to sit in a chair. Keep your weight over your heels, not over your toes. Keep your lower legs upright so they are parallel with the table legs. Do not let your hips go lower than your knees. Do not bend lower than told by your health care provider. If your knee pain increases, do not bend as low. Hold the squat position for 1 second. Slowly push with your legs to return to standing. Do not use your hands to pull yourself to standing. Repeat 2 times. Complete this exercise 3 times per week. Exercise I: Wall Slides (Quadriceps)     Lean your back against a smooth wall or door while you walk your feet out 18-24 inches (46-61 cm) from it. Place your feet hip-width apart. Slowly slide down the wall or door until your knees Repeat 2 times. Complete this exercise every other day. Exercise K: Straight Leg Raises - Hip Abductors  Lie on your side with your left / right leg in the top position. Lie so your head, shoulder, knee, and hip line  up. You may bend your bottom knee to help you keep your balance. Roll your hips slightly forward so your hips are stacked directly over each other and your left / right knee is facing forward. Leading with your heel, lift your top leg 4-6 inches (10-15 cm). You should feel the  muscles in your outer hip lifting. Do not let your foot drift forward. Do not let your knee roll toward the ceiling. Hold this position for 3 seconds. Slowly return your leg to the starting position. Let your muscles relax completely after each repetition. 10 total reps. Repeat 2 times. Complete this exercise 3 times per week. Exercise J: Straight Leg Raises - Hip Extensors  Lie on your abdomen on a firm surface. You can put a pillow under your hips if that is more comfortable. Tense the muscles in your buttocks and lift your left / right leg about 4-6 inches (10-15 cm). Keep your knee straight as you lift your leg. Hold this position for 3 seconds. Slowly lower your leg to the starting position. Let your leg relax completely after each repetition. Repeat 2 times. Complete this exercise 3 times per week. Document Released: 05/17/2005 Document Revised: 03/27/2016 Document Reviewed: 05/09/2015 Elsevier Interactive Patient Education  2017 [employer].

## 2023-10-24 NOTE — Progress Notes (Signed)
 Musculoskeletal Exam  Patient: Charles Grimes DOB: 08-Jun-1956  DOS: 10/24/2023  SUBJECTIVE:  Chief Complaint:   Chief Complaint  Patient presents with   Acute Visit    Patient presents today for bilateral knee pain.    Charles Grimes is a 68 y.o.  male for evaluation and treatment of knee pain.   Onset:  3 months ago. No inj or change in activity.  Location: b/l knees in front Character:  aching  Progression of issue:  is unchanged Associated symptoms: worse at night No bruising, redness, swelling Treatment: to date has been OTC NSAIDS and acetaminophen.   Neurovascular symptoms: no  He also has b/l wrist pain at the base of the thumbs.  Sometimes it is red and swollen.  No bruising.  No injury or change in activity.  Past Medical History:  Diagnosis Date   Anginal pain (HCC)    Anxiety    Arthritis    Coronary artery disease, non-occlusive 08/2013   Mild to moderate (40% OM1) single vessel CAD. Otherwise nonobstructed.   Depression    Dyslipidemia, goal LDL below 100    Essential hypertension    Family history of premature CAD    GERD (gastroesophageal reflux disease)    H/O skin disorder    Involving hands. Subsequently resolved.    Nonischemic cardiomyopathy (HCC) 05/2013   Non-ischemic: EF ~45% by Echo & Myoview --> Non-obstructive CAD   Obesity (BMI 30.0-34.9)    OSA (obstructive sleep apnea) 06/21/2017   uses CPAP   Sleep apnea     Objective: VITAL SIGNS: BP 132/84   Pulse 60   Temp 98.4 F (36.9 C)   Ht 5\' 11"  (1.803 m)   Wt 191 lb (86.6 kg)   SpO2 97%   BMI 26.64 kg/m  Constitutional: Well formed, well developed. No acute distress. Thorax & Lungs: No accessory muscle use Musculoskeletal: knees.   Normal active range of motion: yes.   Normal passive range of motion: yes Tenderness to palpation: yes over b/l pat tendon (worse on R) Deformity: no Ecchymosis: no Tests positive: none Tests negative: Lachman's, Stine's, varus/valgus stress,  patellar app/grind TTP over the extensor tendons of the thumbs bilaterally, positive Finkelstein's, decreased range of motion of both arms.  No edema, ecchymosis, erythema. Neurologic: Normal sensory function.  Gait is slow and cautious. Psychiatric: Normal mood. Age appropriate judgment and insight. Alert & oriented x 3.    Assessment:  De Quervain's tenosynovitis, bilateral - Plan: predniSONE (DELTASONE) 20 MG tablet  Chronic pain of both knees - Plan: DG Knee Complete 4 Views Right, DG Knee Complete 4 Views Left, predniSONE (DELTASONE) 20 MG tablet  Bereavement - Plan: ALPRAZolam (XANAX) 0.5 MG tablet  Plan: Wrist splint given today.  He does have a thumb spica splint at home.  Wear this at night and during aggravating activities.  Prednisone burst today 40 mg daily.  Stretches/exercises, heat, ice, Tylenol.  Chronic, not controlled.  Check x-rays.  Prednisone burst 40 mg daily for 5 days. Consider PT vs injections pending results.  Stretches and exercises provided. F/u in 1 month. The patient voiced understanding and agreement to the plan.   Charles Roche [place], DO 10/24/23  3:55 PM

## 2023-10-31 ENCOUNTER — Other Ambulatory Visit: Payer: Self-pay | Admitting: Family Medicine

## 2023-10-31 MED ORDER — LISINOPRIL 10 MG PO TABS: 10.0000 mg | ORAL_TABLET | Freq: Every day | ORAL | 0 refills | Status: DC

## 2023-11-05 ENCOUNTER — Encounter: Payer: Self-pay | Admitting: Family Medicine

## 2023-11-12 ENCOUNTER — Encounter: Payer: Self-pay | Admitting: Family Medicine

## 2023-11-12 ENCOUNTER — Ambulatory Visit (INDEPENDENT_AMBULATORY_CARE_PROVIDER_SITE_OTHER): Admitting: Family Medicine

## 2023-11-12 VITALS — BP 128/80 | HR 68 | Temp 98.0°F | Resp 16 | Ht 71.0 in | Wt 192.4 lb

## 2023-11-12 DIAGNOSIS — M1711 Unilateral primary osteoarthritis, right knee: Secondary | ICD-10-CM | POA: Insufficient documentation

## 2023-11-12 DIAGNOSIS — L309 Dermatitis, unspecified: Secondary | ICD-10-CM | POA: Diagnosis not present

## 2023-11-12 DIAGNOSIS — K1370 Unspecified lesions of oral mucosa: Secondary | ICD-10-CM

## 2023-11-12 MED ORDER — METHYLPREDNISOLONE ACETATE 40 MG/ML IJ SUSP
40.0000 mg | Freq: Once | INTRAMUSCULAR | Status: AC
  Administered 2023-11-12: 40 mg via INTRAMUSCULAR

## 2023-11-12 MED ORDER — TRIAMCINOLONE ACETONIDE 0.1 % EX CREA: 1.0000 | TOPICAL_CREAM | Freq: Two times a day (BID) | CUTANEOUS | 1 refills | Status: AC | PRN

## 2023-11-12 NOTE — Addendum Note (Signed)
 Addended by: Charne Mcbrien M on: 11/12/2023 09:30 AM   Modules accepted: Orders

## 2023-11-12 NOTE — Patient Instructions (Signed)
 Ice/cold pack over area for 10-15 min twice daily.  Heat (pad or rice pillow in microwave) over affected area, 10-15 minutes twice daily.   OK to take Tylenol  1000 mg (2 extra strength tabs) or 975 mg (3 regular strength tabs) every 6 hours as needed.  Let me know if you'd like to see physical therapy.   Let us  know if you need anything.

## 2023-11-12 NOTE — Progress Notes (Addendum)
 Chief Complaint  Patient presents with   xray    Discuss Xray    Subjective: Patient is a 68 y.o. male here for f/u. Here w mom of his son's fiancee.   Patient was seen a few weeks ago for bilateral knee pain, worse on the right.  X-ray showed arthritic changes in the right side.  Stretches and exercises were not particular helpful.  He does take Tylenol  which is not helpful, ibuprofen is helpful but is still not been continuous.  No neurologic signs or symptoms.  Over the past year, the patient has had a lesion in the inside of his left cheek.  Unsure if it is changing but he notices it on a daily basis.  No pain, drainage, bleeding.  No inciting event.  Past Medical History:  Diagnosis Date   Anginal pain (HCC)    Anxiety    Arthritis    Coronary artery disease, non-occlusive 08/2013   Mild to moderate (40% OM1) single vessel CAD. Otherwise nonobstructed.   Depression    Dyslipidemia, goal LDL below 100    Essential hypertension    Family history of premature CAD    GERD (gastroesophageal reflux disease)    H/O skin disorder    Involving hands. Subsequently resolved.    Nonischemic cardiomyopathy (HCC) 05/2013   Non-ischemic: EF ~45% by Echo & Myoview  --> Non-obstructive CAD   Obesity (BMI 30.0-34.9)    OSA (obstructive sleep apnea) 06/21/2017   uses CPAP   Sleep apnea     Objective: BP 128/80 (BP Location: Left Arm, Patient Position: Sitting)   Pulse 68   Temp 98 F (36.7 C) (Oral)   Resp 16   Ht 5\' 11"  (1.803 m)   Wt 192 lb 6.4 oz (87.3 kg)   SpO2 98%   BMI 26.83 kg/m  General: Awake, appears stated age Mouth: MMM, see below. Lungs: No accessory muscle use Psych: Age appropriate judgment and insight, normal affect and mood    Procedure Note; Knee injection Verbal consent obtained. The area of the antero-lateral joint line was palpated and cleaned with alcohol x1. A 27-gauge needle was used to enter the joint space anterolaterally with ease. 40 mg of  Depomedrol with 2 mL of 1% lidocaine  was injected. The patient tolerated the procedure well. There were no complications noted.  Assessment and Plan: Primary osteoarthritis of right knee - Plan: PR ARTHROCENTESIS ASPIR&/INJ MAJOR JT/BURSA W/O US , methylPREDNISolone  acetate (DEPO-MEDROL ) injection 40 mg  Dermatitis - Plan: triamcinolone  cream (KENALOG ) 0.1 %  Lesion of mouth - Plan: Ambulatory referral to ENT  Tylenol , stretches/exercises, heat, steroid injection today. If no improvement, would consider PT vs referral to ortho.  Refill. Refer to ENT for their opinion. The patient voiced understanding and agreement to the plan.  Shellie Dials [place], DO 11/12/23  12:01 PM

## 2023-11-12 NOTE — Addendum Note (Signed)
 Addended by: Creed Dodrill on: 11/12/2023 12:02 PM   Modules accepted: Orders

## 2024-01-01 ENCOUNTER — Telehealth: Payer: Self-pay

## 2024-01-01 MED ORDER — SERTRALINE HCL 50 MG PO TABS: 50.0000 mg | ORAL_TABLET | Freq: Every day | ORAL | 3 refills | Status: DC

## 2024-01-01 NOTE — Telephone Encounter (Signed)
 Copied from CRM [id number]. Topic: Clinical - Medication Refill >> Jan 01, 2024  8:03 AM Dorisann Garre T wrote: Medication:  sertraline    100 mg   Has the patient contacted their pharmacy? Yes (Agent: If no, request that the patient contact the pharmacy for the refill. If patient does not wish to contact the pharmacy document the reason why and proceed with request.) (Agent: If yes, when and what did the pharmacy advise?)  This is the patient's preferred pharmacy:  [facility] [address] ([address]), [place] - [address] [place]) [place] [address] Phone: [phone] Fax: [phone]  Is this the correct pharmacy for this prescription? Yes If no, delete pharmacy and type the correct one.  PATIENT IS NEEDING ANOTHER PRESCRIPTION  FOR SERTRALINE  HE WAS GIVEN 50MG  SO HE HAD TO DOUBLE UP ON THE MEDICATION THE LAST TIME IT WAS REFILLED HE STATED THAT HE WENT TO REFILL HIS MEDICATION AND WAS TOLD HE HAD TO PAY FOR THE MEDICATION THIS TIME SINCE HE WAS REFILLING IT TO [facility] BUT THE PATIENT HAD TO DOUBLE UP ON THE MEDICATION BECAUSE HE WAS GIVEN 50 MG INSTEAD OF 100 MG TABS   Has the prescription been filled recently? Yes  Is the patient out of the medication? Yes  Has the patient been seen for an appointment in the last year OR does the patient have an upcoming appointment? Yes  Can we respond through MyChart? No  Agent: Please be advised that Rx refills may take up to 3 business days. We ask that you follow-up with your pharmacy.

## 2024-01-14 ENCOUNTER — Ambulatory Visit: Payer: Self-pay | Admitting: Family Medicine

## 2024-01-14 ENCOUNTER — Ambulatory Visit (INDEPENDENT_AMBULATORY_CARE_PROVIDER_SITE_OTHER): Admitting: Family Medicine

## 2024-01-14 ENCOUNTER — Encounter: Payer: Self-pay | Admitting: Family Medicine

## 2024-01-14 VITALS — BP 122/68 | HR 85 | Temp 98.0°F | Resp 16 | Ht 71.0 in | Wt 186.4 lb

## 2024-01-14 DIAGNOSIS — E785 Hyperlipidemia, unspecified: Secondary | ICD-10-CM

## 2024-01-14 DIAGNOSIS — M25512 Pain in left shoulder: Secondary | ICD-10-CM | POA: Diagnosis not present

## 2024-01-14 DIAGNOSIS — F411 Generalized anxiety disorder: Secondary | ICD-10-CM | POA: Diagnosis not present

## 2024-01-14 DIAGNOSIS — M25511 Pain in right shoulder: Secondary | ICD-10-CM

## 2024-01-14 DIAGNOSIS — F17210 Nicotine dependence, cigarettes, uncomplicated: Secondary | ICD-10-CM | POA: Diagnosis not present

## 2024-01-14 DIAGNOSIS — G8929 Other chronic pain: Secondary | ICD-10-CM

## 2024-01-14 DIAGNOSIS — R918 Other nonspecific abnormal finding of lung field: Secondary | ICD-10-CM

## 2024-01-14 DIAGNOSIS — I10 Essential (primary) hypertension: Secondary | ICD-10-CM | POA: Diagnosis not present

## 2024-01-14 DIAGNOSIS — K219 Gastro-esophageal reflux disease without esophagitis: Secondary | ICD-10-CM | POA: Diagnosis not present

## 2024-01-14 LAB — COMPREHENSIVE METABOLIC PANEL WITH GFR
ALT: 11 U/L (ref 0–53)
AST: 16 U/L (ref 0–37)
Albumin: 3.6 g/dL (ref 3.5–5.2)
Alkaline Phosphatase: 159 U/L — ABNORMAL HIGH (ref 39–117)
BUN: 18 mg/dL (ref 6–23)
CO2: 25 meq/L (ref 19–32)
Calcium: 8.5 mg/dL (ref 8.4–10.5)
Chloride: 106 meq/L (ref 96–112)
Creatinine, Ser: 1.03 mg/dL (ref 0.40–1.50)
GFR: 75.18 mL/min (ref >60.00)
Glucose, Bld: 98 mg/dL (ref 70–99)
Potassium: 4.3 meq/L (ref 3.5–5.1)
Sodium: 139 meq/L (ref 135–145)
Total Bilirubin: 0.6 mg/dL (ref 0.2–1.2)
Total Protein: 7.2 g/dL (ref 6.0–8.3)

## 2024-01-14 LAB — LIPID PANEL
Cholesterol: 166 mg/dL (ref 0–200)
HDL: 33.5 mg/dL — ABNORMAL LOW (ref >39.00)
LDL Cholesterol: 107 mg/dL — ABNORMAL HIGH (ref 0–99)
NonHDL: 132.04
Total CHOL/HDL Ratio: 5
Triglycerides: 126 mg/dL (ref 0.0–149.0)
VLDL: 25.2 mg/dL (ref 0.0–40.0)

## 2024-01-14 MED ORDER — LISINOPRIL 10 MG PO TABS: 10.0000 mg | ORAL_TABLET | Freq: Every day | ORAL | 3 refills | Status: AC

## 2024-01-14 MED ORDER — OMEPRAZOLE 20 MG PO CPDR: 20.0000 mg | DELAYED_RELEASE_CAPSULE | Freq: Every day | ORAL | 3 refills | Status: AC

## 2024-01-14 MED ORDER — TIZANIDINE HCL 4 MG PO TABS: 4.0000 mg | ORAL_TABLET | Freq: Every evening | ORAL | 2 refills | Status: AC | PRN

## 2024-01-14 MED ORDER — SERTRALINE HCL 50 MG PO TABS: ORAL_TABLET | ORAL | 3 refills | Status: AC

## 2024-01-14 NOTE — Patient Instructions (Addendum)
 If you do not hear anything about your scan in the next 1-2 weeks, call our office and ask for an update.  If you do not hear anything about your referral in the next 1-2 weeks, call our office and ask for an update.  Keep the diet clean and stay active.  Try to drink 55-60 oz of water daily outside of exercise.  Please consider getting your tetanus booster at the pharmacy.   Stretch your sides 1-2 times daily for 20 seconds per side and repeat for a total of 3 sets. Send me a message in 3-4 weeks if not improving.   Let us  know if you need anything.  EXERCISES  RANGE OF MOTION (ROM) AND STRETCHING EXERCISES These exercises may help you when beginning to rehabilitate your injury. While completing these exercises, remember:  Restoring tissue flexibility helps normal motion to return to the joints. This allows healthier, less painful movement and activity. An effective stretch should be held for at least 30 seconds. A stretch should never be painful. You should only feel a gentle lengthening or release in the stretched tissue.  ROM - Pendulum Bend at the waist so that your right / left arm falls away from your body. Support yourself with your opposite hand on a solid surface, such as a table or a countertop. Your right / left arm should be perpendicular to the ground. If it is not perpendicular, you need to lean over farther. Relax the muscles in your right / left arm and shoulder as much as possible. Gently sway your hips and trunk so they move your right / left arm without any use of your right / left shoulder muscles. Progress your movements so that your right / left arm moves side to side, then forward and backward, and finally, both clockwise and counterclockwise. Complete 10-15 repetitions in each direction. Many people use this exercise to relieve discomfort in their shoulder as well as to gain range of motion. Repeat 2 times. Complete this exercise 3 times per week.  STRETCH -  Flexion, Standing Stand with good posture. With an underhand grip on your right / left hand and an overhand grip on the opposite hand, grasp a broomstick or cane so that your hands are a little more than shoulder-width apart. Keeping your right / left elbow straight and shoulder muscles relaxed, push the stick with your opposite hand to raise your right / left arm in front of your body and then overhead. Raise your arm until you feel a stretch in your right / left shoulder, but before you have increased shoulder pain. Try to avoid shrugging your right / left shoulder as your arm rises by keeping your shoulder blade tucked down and toward your mid-back spine. Hold 30 seconds. Slowly return to the starting position. Repeat 2 times. Complete this exercise 3 times per week.  STRETCH - Internal Rotation Place your right / left hand behind your back, palm-up. Throw a towel or belt over your opposite shoulder. Grasp the towel/belt with your right / left hand. While keeping an upright posture, gently pull up on the towel/belt until you feel a stretch in the front of your right / left shoulder. Avoid shrugging your right / left shoulder as your arm rises by keeping your shoulder blade tucked down and toward your mid-back spine. Hold 30. Release the stretch by lowering your opposite hand. Repeat 2 times. Complete this exercise 3 times per week.  STRETCH - External Rotation and Abduction Stagger your  stance through a doorframe. It does not matter which foot is forward. As instructed by your physician, physical therapist or athletic trainer, place your hands: And forearms above your head and on the door frame. And forearms at head-height and on the door frame. At elbow-height and on the door frame. Keeping your head and chest upright and your stomach muscles tight to prevent over-extending your low-back, slowly shift your weight onto your front foot until you feel a stretch across your chest and/or in the  front of your shoulders. Hold 30 seconds. Shift your weight to your back foot to release the stretch. Repeat 2 times. Complete this stretch 3 times per week.   STRENGTHENING EXERCISES  These exercises may help you when beginning to rehabilitate your injury. They may resolve your symptoms with or without further involvement from your physician, physical therapist or athletic trainer. While completing these exercises, remember:  Muscles can gain both the endurance and the strength needed for everyday activities through controlled exercises. Complete these exercises as instructed by your physician, physical therapist or athletic trainer. Progress the resistance and repetitions only as guided. You may experience muscle soreness or fatigue, but the pain or discomfort you are trying to eliminate should never worsen during these exercises. If this pain does worsen, stop and make certain you are following the directions exactly. If the pain is still present after adjustments, discontinue the exercise until you can discuss the trouble with your clinician. If advised by your physician, during your recovery, avoid activity or exercises which involve actions that place your right / left hand or elbow above your head or behind your back or head. These positions stress the tissues which are trying to heal.  STRENGTH - Scapular Depression and Adduction With good posture, sit on a firm chair. Supported your arms in front of you with pillows, arm rests or a table top. Have your elbows in line with the sides of your body. Gently draw your shoulder blades down and toward your mid-back spine. Gradually increase the tension without tensing the muscles along the top of your shoulders and the back of your neck. Hold for 3 seconds. Slowly release the tension and relax your muscles completely before completing the next repetition. After you have practiced this exercise, remove the arm support and complete it in standing as  well as sitting. Repeat 2 times. Complete this exercise 3 times per week.   STRENGTH - External Rotators Secure a rubber exercise band/tubing to a fixed object so that it is at the same height as your right / left elbow when you are standing or sitting on a firm surface. Stand or sit so that the secured exercise band/tubing is at your side that is not injured. Bend your elbow 90 degrees. Place a folded towel or small pillow under your right / left arm so that your elbow is a few inches away from your side. Keeping the tension on the exercise band/tubing, pull it away from your body, as if pivoting on your elbow. Be sure to keep your body steady so that the movement is only coming from your shoulder rotating. Hold 3 seconds. Release the tension in a controlled manner as you return to the starting position. Repeat 2 times. Complete this exercise 3 times per week.   STRENGTH - Supraspinatus Stand or sit with good posture. Grasp a 2-3 lb weight or an exercise band/tubing so that your hand is thumbs-up, like when you shake hands. Slowly lift your right / left  hand from your thigh into the air, traveling about 30 degrees from straight out at your side. Lift your hand to shoulder height or as far as you can without increasing any shoulder pain. Initially, many people do not lift their hands above shoulder height. Avoid shrugging your right / left shoulder as your arm rises by keeping your shoulder blade tucked down and toward your mid-back spine. Hold for 3 seconds. Control the descent of your hand as you slowly return to your starting position. Repeat 2 times. Complete this exercise 3 times per week.   STRENGTH - Shoulder Extensors Secure a rubber exercise band/tubing so that it is at the height of your shoulders when you are either standing or sitting on a firm arm-less chair. With a thumbs-up grip, grasp an end of the band/tubing in each hand. Straighten your elbows and lift your hands straight in  front of you at shoulder height. Step back away from the secured end of band/tubing until it becomes tense. Squeezing your shoulder blades together, pull your hands down to the sides of your thighs. Do not allow your hands to go behind you. Hold for 3 seconds. Slowly ease the tension on the band/tubing as you reverse the directions and return to the starting position. Repeat 2 times. Complete this exercise 3 times per week.   STRENGTH - Scapular Retractors Secure a rubber exercise band/tubing so that it is at the height of your shoulders when you are either standing or sitting on a firm arm-less chair. With a palm-down grip, grasp an end of the band/tubing in each hand. Straighten your elbows and lift your hands straight in front of you at shoulder height. Step back away from the secured end of band/tubing until it becomes tense. Squeezing your shoulder blades together, draw your elbows back as you bend them. Keep your upper arm lifted away from your body throughout the exercise. Hold 3 seconds. Slowly ease the tension on the band/tubing as you reverse the directions and return to the starting position. Repeat 2 times. Complete this exercise 3 times per week.  STRENGTH - Scapular Depressors Find a sturdy chair without wheels, such as a from a dining room table. Keeping your feet on the floor, lift your bottom from the seat and lock your elbows. Keeping your elbows straight, allow gravity to pull your body weight down. Your shoulders will rise toward your ears. Raise your body against gravity by drawing your shoulder blades down your back, shortening the distance between your shoulders and ears. Although your feet should always maintain contact with the floor, your feet should progressively support less body weight as you get stronger. Hold 3 seconds. In a controlled and slow manner, lower your body weight to begin the next repetition. Repeat 2 times. Complete this exercise 3 times per week.     This information is not intended to replace advice given to you by your health care provider. Make sure you discuss any questions you have with your health care provider.   Document Released: 05/17/2005 Document Revised: 07/24/2014 Document Reviewed: 10/15/2008 Elsevier Interactive Patient Education [employer]

## 2024-01-14 NOTE — Progress Notes (Signed)
 Chief Complaint  Patient presents with   Medication Refill    Medication Check    Subjective Charles Grimes is a 68 y.o. male who presents for hypertension follow up. He does not monitor home blood pressures. He is compliant with medications. Patient has these side effects of medication: none He is adhering to a healthy diet overall. Current exercise: active in yard No CP or SOB.   GAD Patient has a history of anxiety worsened after his wife passed away.  He currently takes Zoloft  100 mg in the morning, 50 mg in the evening.  He reports compliance and no adverse effects.  It works well for him.  No homicidal or suicidal ideation.  He is not following with a therapist.  Hyperlipidemia Patient presents for dyslipidemia follow up. Currently being treated with Lipitor 10 mg daily. He confirms myalgias.  These resolved when he stopped his Lipitor. Diet/exercise as above. He continues to smoke around 1 pack/day. The patient is known to have coexisting coronary artery disease.  GERD The patient has a history of reflux.  He takes omeprazole  20 mg daily.  He reports compliance and no adverse effects.  Refill symptoms are controlled.  No difficulty swallowing, unintentional weight loss, abdominal pain, or bleeding.  Patient also brought right shoulder pain.  He had a replacement done around 10 years ago with Dr. Mariea of [facility].  No recent injury or change in activity.  He has pain over the top and front of the shoulder.  No bruising, redness, swelling.  He does have associated decreased range of motion.   Past Medical History:  Diagnosis Date   Anginal pain (HCC)    Anxiety    Arthritis    Coronary artery disease, non-occlusive 08/2013   Mild to moderate (40% OM1) single vessel CAD. Otherwise nonobstructed.   Depression    Dyslipidemia, goal LDL below 100    Essential hypertension    Family history of premature CAD    GERD (gastroesophageal reflux disease)    H/O skin disorder     Involving hands. Subsequently resolved.    Nonischemic cardiomyopathy (HCC) 05/2013   Non-ischemic: EF ~45% by Echo & Myoview  --> Non-obstructive CAD   Obesity (BMI 30.0-34.9)    OSA (obstructive sleep apnea) 06/21/2017   uses CPAP   Sleep apnea     Exam BP 122/68 (BP Location: Left Arm, Patient Position: Sitting)   Pulse 85   Temp 98 F (36.7 C) (Oral)   Resp 16   Ht 5' 11 (1.803 m)   Wt 186 lb 6.4 oz (84.6 kg)   SpO2 96%   BMI 26.00 kg/m  General:  well developed, well nourished, in no apparent distress Heart: RRR, no bruits, no LE edema Lungs: clear to auscultation, no accessory muscle use MSK: Decreased active and passive range of motion of the right shoulder.  Positive Neer's, empty can, speeds.  Negative crossover, liftoff. Abdomen: Bowel sounds present, soft, nontender, nondistended Psych: well oriented with normal range of affect and appropriate judgment/insight  Essential hypertension - Plan: lisinopril  (ZESTRIL ) 10 MG tablet  GAD (generalized anxiety disorder) - Plan: sertraline  (ZOLOFT ) 50 MG tablet  Chronic right shoulder pain  Dyslipidemia - Plan: Lipid panel, Comprehensive metabolic panel with GFR  Gastroesophageal reflux disease, unspecified whether esophagitis present - Plan: omeprazole  (PRILOSEC ) 20 MG capsule  Chronic pain of both shoulders - Plan: tiZANidine  (ZANAFLEX ) 4 MG tablet, Ambulatory referral to Orthopedic Surgery  Smoking greater than 20 pack years - Plan: CT CHEST  LUNG CANCER SCREENING LOW DOSE WO CONTRAST  Chronic, stable.  Continue lisinopril  10 mg daily.  Counseled on diet and exercise. Chronic, stable.  Continue Zoloft  100 mg in the morning and 50 mg in the evening. Stretches and exercises provided.  Refer back to Ortho. Chronic, adverse effect of medication.  He is currently not taking Lipitor 10 mg daily.  Check labs today.  Needs to stay hydrated. Chronic, stable.  Continue omeprazole  20 mg daily. As above. CT ordered  above. F/u in 6 months. The patient voiced understanding and agreement to the plan.  Mabel [place], DO 01/14/24  8:59 AM

## 2024-01-21 ENCOUNTER — Ambulatory Visit (HOSPITAL_BASED_OUTPATIENT_CLINIC_OR_DEPARTMENT_OTHER)
Admission: RE | Admit: 2024-01-21 | Discharge: 2024-01-21 | Disposition: A | Discharge status: Home / Self Care | Source: Ambulatory Visit | Attending: Family Medicine | Admitting: Family Medicine

## 2024-01-21 DIAGNOSIS — F17210 Nicotine dependence, cigarettes, uncomplicated: Secondary | ICD-10-CM | POA: Insufficient documentation

## 2024-01-31 ENCOUNTER — Encounter (INDEPENDENT_AMBULATORY_CARE_PROVIDER_SITE_OTHER): Payer: Self-pay | Admitting: Otolaryngology

## 2024-01-31 ENCOUNTER — Ambulatory Visit (INDEPENDENT_AMBULATORY_CARE_PROVIDER_SITE_OTHER): Admitting: Otolaryngology

## 2024-01-31 VITALS — BP 126/78 | HR 53

## 2024-01-31 DIAGNOSIS — F17210 Nicotine dependence, cigarettes, uncomplicated: Secondary | ICD-10-CM

## 2024-01-31 DIAGNOSIS — Z720 Tobacco use: Secondary | ICD-10-CM

## 2024-01-31 DIAGNOSIS — D490 Neoplasm of unspecified behavior of digestive system: Secondary | ICD-10-CM

## 2024-01-31 DIAGNOSIS — K1370 Unspecified lesions of oral mucosa: Secondary | ICD-10-CM | POA: Diagnosis not present

## 2024-01-31 NOTE — Progress Notes (Signed)
 ENT CONSULT:  Reason for Consult: left buccal mucosa lesion/mass   HPI: Discussed the use of AI scribe software for clinical note transcription with the patient, who gave verbal consent to proceed.  History of Present Illness Charles Grimes is a 68 year old male who presents with a bothersome oral lesion along left buccal mucosa.  He has had an oral lesion for approximately one year, which has been worsening over time. The lesion is located in his mouth, and he frequently finds himself unable to keep his tongue off of it. Occasionally, he bites the lesion while eating, which causes discomfort. The lesion does not typically cause pain, but it can be bothersome at times.  He denies any history of taking blood thinners, although he does take a daily 81 mg baby aspirin . Hx of smoking.     Records Reviewed:  PCP office visit note 11/12/23 Over the past year, the patient has had a lesion in the inside of his left cheek. Unsure if it is changing but he notices it on a daily basis. No pain, drainage, bleeding. No inciting event.     Past Medical History:  Diagnosis Date   Anginal pain (HCC)    Anxiety    Arthritis    Coronary artery disease, non-occlusive 08/2013   Mild to moderate (40% OM1) single vessel CAD. Otherwise nonobstructed.   Depression    Dyslipidemia, goal LDL below 100    Essential hypertension    Family history of premature CAD    GERD (gastroesophageal reflux disease)    H/O skin disorder    Involving hands. Subsequently resolved.    Nonischemic cardiomyopathy (HCC) 05/2013   Non-ischemic: EF ~45% by Echo & Myoview  --> Non-obstructive CAD   Obesity (BMI 30.0-34.9)    OSA (obstructive sleep apnea) 06/21/2017   uses CPAP   Sleep apnea     Past Surgical History:  Procedure Laterality Date   COLONOSCOPY  2015   COLONOSCOPY WITH PROPOFOL   01/02/2017   Dr.Pyrtle   LEFT HEART CATHETERIZATION WITH CORONARY ANGIOGRAM  08/26/2013   Mild to moderate disease with  40-50% stenosis and OM1. Otherwise no significant CAD --  Surgeon: Alm LELON Clay, MD;  Location: [facility] CATH LAB;  Service: Cardiovascular;;   Lower extremity arterial Dopplers  06/11/2013   No occlusive disease   LUMBAR LAMINECTOMY/DECOMPRESSION MICRODISCECTOMY Right 10/07/2014   Procedure: LUMBAR LAMINECTOMY/DECOMPRESSION MICRODISCECTOMY 1 LEVEL;  Surgeon: Arley Helling, MD;  Location: [facility] NEURO ORS;  Service: Neurosurgery;  Laterality: Right;  Right L3-L4 Microdiscectomy   NM MYOVIEW  LTD  06/03/2013   Low risk study with no ischemia. EF roughly 44% with no regional wall motion abnormalities noted   PFTs  06/16/2013   Normal Volumes &  Spirometry; Moderately reduced DLCO   POLYPECTOMY     SHOULDER HEMI-ARTHROPLASTY Right 05/28/2015   Procedure: RIGHT SHOULDER HEMI-ARTHROPLASTY CTA HEAD AND SUBSCAP REPAIR ;  Surgeon: Marcey Her, MD;  Location: [facility] OR;  Service: Orthopedics;  Laterality: Right;   TRANSTHORACIC ECHOCARDIOGRAM  06/11/2013   Mildly reduced EF: 45-50%. Mild anterior hypokinesis with incoordinate septal motion. No evidence of pulmonary hypertension    Family History  Problem Relation Age of Onset   Atrial fibrillation Mother        Alive at 34   COPD Mother    Hypertension Mother    Colonic polyp Mother    Hypertension Father        Alive at 54   Lung cancer Father  Chronic smoker   Factor V Leiden deficiency Father        Also Lupus anticoagulant   Heart attack Maternal Grandmother 48   Heart failure Paternal Grandmother    Diabetes Paternal Grandmother    Heart attack Brother 63       X2   Heart attack Sister 70       Deceased   Healthy Sister        x2   Healthy Son        x2   Colon cancer Neg Hx    Esophageal cancer Neg Hx    Rectal cancer Neg Hx    Stomach cancer Neg Hx     Social History:  reports that he has been smoking cigarettes. He has a 42 pack-year smoking history. He has never used smokeless tobacco. He reports that he does not currently use  alcohol. He reports that he does not use drugs.  Allergies:  Allergies  Allergen Reactions   Ibuprofen    Isosorbide  Nitrate Other (See Comments)    CONTINUOUS HEADACHE    Oxycodone  Itching    Reaction was to straight oxycodone  15 mg tablets - no reaction to percocet   Penicillins Hives and Rash    Has patient had a PCN reaction causing immediate rash, facial/tongue/throat swelling, SOB or lightheadedness with hypotension: Yes Has patient had a PCN reaction causing severe rash involving mucus membranes or skin necrosis: No Has patient had a PCN reaction that required hospitalization No Has patient had a PCN reaction occurring within the last 10 years: No If all of the above answers are NO, then may proceed with Cephalosporin use.    Medications: I have reviewed the patient's current medications.  The PMH, PSH, Medications, Allergies, and SH were reviewed and updated.  ROS: Constitutional: Negative for fever, weight loss and weight gain. Cardiovascular: Negative for chest pain and dyspnea on exertion. Respiratory: Is not experiencing shortness of breath at rest. Gastrointestinal: Negative for nausea and vomiting. Neurological: Negative for headaches. Psychiatric: The patient is not nervous/anxious  Blood pressure 126/78, pulse (!) 53, SpO2 96%.  PHYSICAL EXAM:  Exam: General: Well-developed, well-nourished Respiratory Respiratory effort: Equal inspiration and expiration without stridor Cardiovascular Peripheral Vascular: Warm extremities with equal color/perfusion Eyes: No nystagmus with equal extraocular motion bilaterally Neuro/Psych/Balance: Patient oriented to person, place, and time; Appropriate mood and affect; Gait is intact with no imbalance; Cranial nerves I-XII are intact Head and Face Inspection: Normocephalic and atraumatic without mass or lesion Palpation: Facial skeleton intact without bony stepoffs Salivary Glands: No mass or tenderness Facial Strength:  Facial motility symmetric and full bilaterally ENT Pinna: External ear intact and fully developed External canal: Canal is patent with intact skin Tympanic Membrane: Clear and mobile External Nose: No scar or anatomic deformity Internal Nose: Septum intact and midline. No edema, polyp, or rhinorrhea Lips, Teeth, and gums: Mucosa and teeth intact and viable TMJ: No pain to palpation with full mobility Oral cavity/oropharynx: No erythema or exudate, no lesions present Round 4 mm mass at the left buccal mucosa, minimal overlying leukoplakia  Neck Neck and Trachea: Midline trachea without mass or lesion Thyroid: No mass or nodularity Lymphatics: No lymphadenopathy   Studies Reviewed: CT chest 01/21/24 Lung-RADS 4X, highly suspicious. Additional imaging evaluation with dedicated contrast enhanced chest CT or PET CT examination and consultation with Pulmonology or Thoracic Surgery recommended. These results will be called to the ordering clinician or representative by the Radiologist Assistant, and communication documented in the PACS  or [employer].   Moderate coronary artery calcification. Morphologic changes in keeping with pulmonary arterial hypertension. Severe emphysema   Assessment/Plan: Encounter Diagnoses  Name Primary?   Oral lesion Yes   Neoplasm of oral mucosa    Tobacco abuse     Assessment and Plan Assessment & Plan Left buccal mucosal mass/lesion  Neoplasm of uncertain behavior or oral mucosa, in a current smoker 50 pack-year hx - Schedule excision for tissue diagnosis in office under local anesthesia.  Tobacco use disorder.  We had an extensive discussion about detrimental effects of smoking on overall health. I provided resources available at [facility] to assist with smoking cessation. I spent 4 min on counseling  - Advised smoking cessation    Thank you for allowing me to participate in the care of this patient. Please do not hesitate to contact me  with any questions or concerns.   Elena Larry, MD Otolaryngology [facility] Phone: [phone] Fax: [phone]    01/31/2024, 9:15 AM

## 2024-01-31 NOTE — Patient Instructions (Signed)
 Dear Charles Grimes,   Congratulations for your interest in quitting smoking!  Find a program that suits you best: when you want to quit, how you need support, where you live, and how you like to learn.    If you're ready to get started TODAY, consider scheduling a visit through [email or web address].  Appointments are available from 8am to 8pm, Monday to Friday.   Most health insurance plans will cover some level of tobacco cessation visits and medications.    Additional Resources: [employer] are also available to help you quit & provide the support you'll need. Many programs are available in both [place] and Spanish and have a long history of successfully helping people get off and stay off tobacco.    Quit Smoking Apps:  quitSTART at [employer].de QuitGuide?at [employer].dk Online education and resources: Smokefree  at [employer].gov Free Telephone Coaching: QuitNow,  Call 1-800-QUIT-NOW (848-781-6527) or Text- Ready to 5146523889 *Quitline [place] has teamed up with Medicaid to offer a free 14 week program    Vaping- Want to Quit? Free 24/7 support. Call [facility]  [place], [place], [place], [place], [place]  [facility]

## 2024-02-12 ENCOUNTER — Encounter: Payer: Self-pay | Admitting: Acute Care

## 2024-02-12 ENCOUNTER — Telehealth: Payer: Self-pay | Admitting: Acute Care

## 2024-02-12 ENCOUNTER — Ambulatory Visit: Admitting: Emergency Medicine

## 2024-02-12 ENCOUNTER — Ambulatory Visit: Admitting: Acute Care

## 2024-02-12 VITALS — BP 135/80 | HR 54 | Temp 97.9°F | Ht 71.0 in | Wt 188.2 lb

## 2024-02-12 DIAGNOSIS — R918 Other nonspecific abnormal finding of lung field: Secondary | ICD-10-CM

## 2024-02-12 DIAGNOSIS — J449 Chronic obstructive pulmonary disease, unspecified: Secondary | ICD-10-CM | POA: Diagnosis not present

## 2024-02-12 DIAGNOSIS — R911 Solitary pulmonary nodule: Secondary | ICD-10-CM | POA: Diagnosis not present

## 2024-02-12 DIAGNOSIS — R9389 Abnormal findings on diagnostic imaging of other specified body structures: Secondary | ICD-10-CM | POA: Diagnosis not present

## 2024-02-12 DIAGNOSIS — F17210 Nicotine dependence, cigarettes, uncomplicated: Secondary | ICD-10-CM | POA: Diagnosis not present

## 2024-02-12 LAB — PULMONARY FUNCTION TEST
DL/VA % pred: 44 %
DL/VA: 1.8 ml/min/mmHg/L
DLCO cor % pred: 44 %
DLCO cor: 12.13 ml/min/mmHg
DLCO unc % pred: 44 %
DLCO unc: 12.13 ml/min/mmHg
FEF 25-75 Post: 2.86 L/s
FEF 25-75 Pre: 1.8 L/s
FEF2575-%Change-Post: 59 %
FEF2575-%Pred-Post: 105 %
FEF2575-%Pred-Pre: 66 %
FEV1-%Change-Post: 9 %
FEV1-%Pred-Post: 103 %
FEV1-%Pred-Pre: 94 %
FEV1-Post: 3.6 L
FEV1-Pre: 3.29 L
FEV1FVC-%Change-Post: 1 %
FEV1FVC-%Pred-Pre: 89 %
FEV6-%Change-Post: 6 %
FEV6-%Pred-Post: 117 %
FEV6-%Pred-Pre: 110 %
FEV6-Post: 5.25 L
FEV6-Pre: 4.92 L
FEV6FVC-%Change-Post: 0 %
FEV6FVC-%Pred-Post: 104 %
FEV6FVC-%Pred-Pre: 104 %
FVC-%Change-Post: 7 %
FVC-%Pred-Post: 113 %
FVC-%Pred-Pre: 105 %
FVC-Post: 5.32 L
FVC-Pre: 4.97 L
Post FEV1/FVC ratio: 68 %
Post FEV6/FVC ratio: 99 %
Pre FEV1/FVC ratio: 66 %
Pre FEV6/FVC Ratio: 99 %
RV % pred: 95 %
RV: 2.33 L
TLC % pred: 105 %
TLC: 7.65 L

## 2024-02-12 MED ORDER — BREZTRI AEROSPHERE 160-9-4.8 MCG/ACT IN AERO: 2.0000 | INHALATION_SPRAY | Freq: Two times a day (BID) | RESPIRATORY_TRACT | Status: DC

## 2024-02-12 NOTE — Progress Notes (Signed)
 Full PFT performed today.

## 2024-02-12 NOTE — Patient Instructions (Addendum)
 It is good to see you today We have reviewed your CT Chest. Next best step is for biopsy of the left hilar mass. I have placed an order for a bronchoscopy with biopsies.  We have discussed the procedure in detail.  We have reviewed the risks and benefits of the procedure. These include bleeding, infection, puncture of the lung, and adverse reaction to anesthesia. You have agreed to proceed with biopsy to evaluate the area of concern Your procedure will be done by Dr. Lamar Chris. You will receive a letter today with date time and information pertaining to the procedure. You will need someone to drive you to the procedure, stay with you during the procedure, and stay with you after the procedure. You will also need someone to stay with you for 24 hours after anesthesia to ensure you have cleared and are doing well. You will follow-up with me 1 week after the procedure to review the results and to ensure you are doing well. Call if you need us  prior to the procedure or if you have any questions at all. Please contact office for sooner follow up if symptoms do not improve or worsen or seek emergency care     I have ordered a PET scan to better evaluate the nodule.You will get a call to get this scheduled. I have ordered PFT's to better evaluate your breathing. You will get a call to get this scheduled. We will do a therapeutic trial with Breztri  for you shortness of breath. Take 2 puffs in the morning and 2 puffs in the evening. Rinse mouth after use. We will evaluate if they help your breathing at follow up. Please work on quitting smoking completely. You can receive free nicotine  replacement therapy (patches, gum, or mints) by calling 1-800-QUIT NOW. Please call so we can get you on the path to becoming a non-smoker. I know it is hard, but you can do this!  Hypnosis for smoking cessation  [employer] [phone]  Acupuncture for smoking cessation  [employer] [phone]

## 2024-02-12 NOTE — Progress Notes (Signed)
 History of Present Illness Charles Grimes is a 68 y.o. male current every day smoker ( 42 pack year history) referred for an abnormal LDCT ( 4 X). He will be followed by Dr. Shelah.  Synopsis Charles Grimes is a 68 y.o. male with history of tobacco use who was found to have a left hilar mass on lung cancer screening CT chest.     02/12/2024 Pt. Presents for lung nodule consult.  Patient was undergoing lung cancer screening through his primary physician Dr. [name].  Patient is a current someday smoker.  Low-dose CT done 01/21/2024 resulted as a lung RADS 4X, highly suspicious.  There had been an interval development of a 5.5 x 3.8 cm left sup hilar mass.  This mass obliterates the apicoposterior segmental bronchus of the left upper lobe. Dr. Pry referred the patient to [facility] pulmonary for consideration of robotic assisted navigational bronchoscopies with biopsies for definitive tissue diagnosis. Patient is a former smoker with a 42-pack-year smoking history.  He quit smoking 01/04/2024.  We discussed the next best step moving forward was for robotic assisted navigational bronchoscopy with biopsies for tissue diagnosis.  We discussed the risk to include bleeding, infection, worsening dyspnea, pneumothorax, and adverse reaction to anesthesia.  Patient was in agreement with moving forward.  He has been scheduled for the procedure with Dr. [name] on 02/19/2024.  In the interim we will order pet imaging, and pulmonary function testing.  This will help to pinpoint the best location for biopsy.  Pulmonary function testing will help us  to determine why you have shortness of breath.  Patient does endorse shortness of breath.  Currently he is not using a maintenance inhaler.  Will do a therapeutic trial with Breztri  2 puffs in the morning and 2 puffs in the evening rinsing mouth after use to see if patient has significant benefit of treatment.  Patient has been counseled additionally to  work hard to maintain smoking abstinence.  Resources to support smoking cessation were specified on his after visit summary.  Patient had no further questions at completion of the office visit.  He understands he will follow-up with me 1 to 2 weeks after the procedure to review the results and determine next best steps in his plan of care.    Test Results: Low-dose CT 02/03/2024 Lung RADS 4X, highly suspicious Interval development of a roughly 5.5 x 3.8 cm left suprahilar mass.  This lobulated mass obliterates the apicoposterior segmental bronchus of the left upper lobe.  Radiology recommendation was for consultation with pulmonary or thoracic surgery.     Latest Ref Rng & Units 07/06/2023   11:38 PM 05/25/2023   11:39 AM 05/17/2023    4:23 AM  CBC  WBC 4.0 - 10.5 K/uL 10.1  6.7  5.9   Hemoglobin 13.0 - 17.0 g/dL 87.6  86.1  88.3   Hematocrit 39.0 - 52.0 % 37.8  42.0  36.0   Platelets 150 - 400 K/uL 158  152.0  121        Latest Ref Rng & Units 01/14/2024    8:38 AM 07/10/2023   12:33 PM 07/06/2023   11:38 PM  BMP  Glucose 70 - 99 mg/dL 98  94  93   BUN 6 - 23 mg/dL 18  22  16    Creatinine 0.40 - 1.50 mg/dL 8.96  9.16  9.11   Sodium 135 - 145 mEq/L 139  139  138   Potassium 3.5 - 5.1 mEq/L  4.3  3.9  3.5   Chloride 96 - 112 mEq/L 106  104  110   CO2 19 - 32 mEq/L 25  28  20    Calcium  8.4 - 10.5 mg/dL 8.5  8.7  8.2     BNP    Component Value Date/Time   BNP 214.8 (H) 07/06/2023 2338    ProBNP No results found for: PROBNP  PFT No results found for: FEV1PRE, FEV1POST, FVCPRE, FVCPOST, TLC, DLCOUNC, PREFEV1FVCRT, PSTFEV1FVCRT  CT CHEST LUNG CANCER SCREENING LOW DOSE WO CONTRAST Result Date: 01/26/2024 CLINICAL DATA:  Current smoker, 50 pack-year smoking history, asymptomatic EXAM: CT CHEST WITHOUT CONTRAST LOW-DOSE FOR LUNG CANCER SCREENING TECHNIQUE: Multidetector CT imaging of the chest was performed following the standard protocol without IV contrast.  RADIATION DOSE REDUCTION: This exam was performed according to the departmental dose-optimization program which includes automated exposure control, adjustment of the mA and/or kV according to patient size and/or use of iterative reconstruction technique. COMPARISON:  03/17/2021, 02/16/2020 FINDINGS: Cardiovascular: Moderate multi-vessel coronary artery calcification. Global cardiac size within normal limits. No pericardial effusion. Central pulmonary arteries are enlarged in keeping with changes of pulmonary arterial hypertension. Mild atherosclerotic calcification within the thoracic aorta. No aortic aneurysm. Mediastinum/Nodes: Left hilar mass obscures the nodal station. No additional pathologic thoracic adenopathy identified. Visualized thyroid is unremarkable. Esophagus is unremarkable. Lungs/Pleura: Pre-existing pulmonary nodules are stable. See radiologist derived volumetric analysis. Severe emphysema. Interval development of a roughly 5.5 x 3.8 cm left suprahilar mass, not definitively characterized on this noncontrast examination. This lobulated mass obliterates the apicoposterior segmental bronchus of the left upper lobe. No pneumothorax or pleural effusion. Upper Abdomen: No acute abnormality. Musculoskeletal: No acute bone abnormality. No lytic or blastic bone lesion. Osseous structures are age appropriate. IMPRESSION: Lung-RADS 4X, highly suspicious. Additional imaging evaluation with dedicated contrast enhanced chest CT or PET CT examination and consultation with Pulmonology or Thoracic Surgery recommended. These results will be called to the ordering clinician or representative by the Radiologist Assistant, and communication documented in the PACS or [employer]. Moderate coronary artery calcification. Morphologic changes in keeping with pulmonary arterial hypertension. Severe emphysema Aortic Atherosclerosis (ICD10-I70.0) and Emphysema (ICD10-J43.9). Electronically Signed   By: Dorethia Molt  M.D.   On: 01/26/2024 21:40     Past medical hx Past Medical History:  Diagnosis Date   Anginal pain (HCC)    Anxiety    Arthritis    Coronary artery disease, non-occlusive 08/2013   Mild to moderate (40% OM1) single vessel CAD. Otherwise nonobstructed.   Depression    Dyslipidemia, goal LDL below 100    Essential hypertension    Family history of premature CAD    GERD (gastroesophageal reflux disease)    H/O skin disorder    Involving hands. Subsequently resolved.    Nonischemic cardiomyopathy (HCC) 05/2013   Non-ischemic: EF ~45% by Echo & Myoview  --> Non-obstructive CAD   Obesity (BMI 30.0-34.9)    OSA (obstructive sleep apnea) 06/21/2017   uses CPAP   Sleep apnea      Social History   Tobacco Use   Smoking status: Every Day    Current packs/day: 1.00    Average packs/day: 1 pack/day for 42.0 years (42.0 ttl pk-yrs)    Types: Cigarettes   Smokeless tobacco: Never   Tobacco comments:    3 cigarettes a day 02/12/2024 KRD  Vaping Use   Vaping status: Former   Substances: Flavoring  Substance Use Topics   Alcohol use: Not Currently  Comment: rarely   Drug use: No    Mr.Gahan reports that he has been smoking cigarettes. He has a 42 pack-year smoking history. He has never used smokeless tobacco. He reports that he does not currently use alcohol. He reports that he does not use drugs.  Tobacco Cessation: Ready to quit: Not Answered Counseling given: Not Answered Tobacco comments: 3 cigarettes a day 02/12/2024 KRD Currently smoking 3 cigarettes a day Patient has a 42-pack-year smoking history  Past surgical hx, Family hx, Social hx all reviewed.  Current Outpatient Medications on File Prior to Visit  Medication Sig   ALPRAZolam  (XANAX ) 0.5 MG tablet Take 1 tablet (0.5 mg total) by mouth at bedtime as needed for anxiety.   aspirin  EC 81 MG tablet Take 81 mg by mouth daily.   atorvastatin  (LIPITOR) 10 MG tablet Take 1 tablet (10 mg total) by mouth daily.    lisinopril  (ZESTRIL ) 10 MG tablet Take 1 tablet (10 mg total) by mouth daily.   nitroGLYCERIN  (NITROSTAT ) 0.4 MG SL tablet Place 1 tablet (0.4 mg total) under the tongue every 5 (five) minutes as needed for chest pain.   omeprazole  (PRILOSEC ) 20 MG capsule Take 1 capsule (20 mg total) by mouth daily.   sertraline  (ZOLOFT ) 50 MG tablet Take 2 tabs in the morning and 1 tab in the evening.   tiZANidine  (ZANAFLEX ) 4 MG tablet Take 1 tablet (4 mg total) by mouth at bedtime as needed for muscle spasms.   triamcinolone  cream (KENALOG ) 0.1 % Apply 1 Application topically 2 (two) times daily as needed (itch).   colchicine  0.6 MG tablet Take 2 tabs at first sign of a flare and repeat 1 tab 1 hr later. Take 1 tab twice daily until flare resolves. (Patient not taking: Reported on 02/12/2024)   No current facility-administered medications on file prior to visit.     Allergies  Allergen Reactions   Ibuprofen    Isosorbide  Nitrate Other (See Comments)    CONTINUOUS HEADACHE    Oxycodone  Itching    Reaction was to straight oxycodone  15 mg tablets - no reaction to percocet   Penicillins Hives and Rash    Has patient had a PCN reaction causing immediate rash, facial/tongue/throat swelling, SOB or lightheadedness with hypotension: Yes Has patient had a PCN reaction causing severe rash involving mucus membranes or skin necrosis: No Has patient had a PCN reaction that required hospitalization No Has patient had a PCN reaction occurring within the last 10 years: No If all of the above answers are NO, then may proceed with Cephalosporin use.    Review Of Systems:  Constitutional:   No  weight loss, night sweats,  Fevers, chills, fatigue, or  lassitude.  HEENT:   No headaches,  Difficulty swallowing,  Tooth/dental problems, or  Sore throat,                No sneezing, itching, ear ache, nasal congestion, post nasal drip,   CV:  No chest pain,  Orthopnea, PND, swelling in lower extremities, anasarca,  dizziness, palpitations, syncope.   GI  No heartburn, indigestion, abdominal pain, nausea, vomiting, diarrhea, change in bowel habits, loss of appetite, bloody stools.   Resp: + shortness of breath with exertion less at rest.  No excess mucus, + baseline  productive cough,  No non-productive cough,  No coughing up of blood.  No change in color of mucus.  + occasional  wheezing.  No chest wall deformity  Skin: no rash or lesions.  GU: no dysuria, change in color of urine, no urgency or frequency.  No flank pain, no hematuria   MS:  No joint pain or swelling.  No decreased range of motion.  No back pain.  Psych:  No change in mood or affect. No depression or anxiety.  No memory loss.   Vital Signs BP 135/80 (BP Location: Left Arm, Patient Position: Sitting, Cuff Size: Normal)   Pulse (!) 54   Temp 97.9 F (36.6 C) (Oral)   Ht 5' 11 (1.803 m)   Wt 188 lb 3.2 oz (85.4 kg)   SpO2 98%   BMI 26.25 kg/m    Physical Exam:  General- No distress,  A&Ox3, pleasant and appropriate ENT: No sinus tenderness, TM clear, pale nasal mucosa, no oral exudate,no post nasal drip, no LAN Cardiac: S1, S2, regular rate and rhythm, no murmur Chest: + faint  wheeze/ No rales/ dullness; no accessory muscle use, no nasal flaring, no sternal retractions Abd.: Soft Non-tender, nondistended, bowel sounds positive,Body mass index is 26.25 kg/m.  Ext: No clubbing cyanosis, edema Neuro: Mild physical deconditioning, moving all extremities x 4, alert and oriented x 3, appropriate Skin: No rashes, warm and dry, no obvious skin lesions Psych: normal mood and behavior   Assessment/Plan Lung RADS 4X low-dose screening CT New 5.5 x 3.8 cm left suprahilar mass Very concerning for bronchogenic carcinoma. Current someday smoker Dyspnea on exertion Plan We have reviewed your CT Chest. Next best step is for biopsy of the left hilar mass. I have placed an order for a bronchoscopy with biopsies.  We have  discussed the procedure in detail.  We have reviewed the risks and benefits of the procedure. These include bleeding, infection, puncture of the lung, and adverse reaction to anesthesia. You have agreed to proceed with biopsy to evaluate the area of concern Your procedure will be done by Dr. Lamar Chris. You will receive a letter today with date time and information pertaining to the procedure. You will need someone to drive you to the procedure, stay with you during the procedure, and stay with you after the procedure. You will also need someone to stay with you for 24 hours after anesthesia to ensure you have cleared and are doing well. You will follow-up with me 1 week after the procedure to review the results and to ensure you are doing well. Call if you need us  prior to the procedure or if you have any questions at all. Please contact office for sooner follow up if symptoms do not improve or worsen or seek emergency care     I have ordered a PET scan to better evaluate the nodule.You will get a call to get this scheduled. I have ordered PFT's to better evaluate your breathing. You will get a call to get this scheduled. We will do a therapeutic trial with Breztri  for you shortness of breath. Take 2 puffs in the morning and 2 puffs in the evening. Rinse mouth after use. We will evaluate if they help your breathing at follow up. Please work on quitting smoking completely. You can receive free nicotine  replacement therapy (patches, gum, or mints) by calling 1-800-QUIT NOW. Please call so we can get you on the path to becoming a non-smoker. I know it is hard, but you can do this!  Hypnosis for smoking cessation  [employer] [phone]  Acupuncture for smoking cessation  [employer] [phone]    I spent 25 minutes dedicated to the care  of this patient on the date of this encounter to include pre-visit review of records, face-to-face time with the patient  discussing conditions above, post visit ordering of testing, clinical documentation with the electronic health record, making appropriate referrals as documented, and communicating necessary information to the patient's healthcare team.   Lauraine JULIANNA Lites, NP 02/12/2024  8:33 AM

## 2024-02-12 NOTE — Telephone Encounter (Signed)
 Letter given to [facility], scheduled 02/19/24 at [facility]. Routing to [employer] to [employer].

## 2024-02-12 NOTE — Telephone Encounter (Signed)
 Please schedule the following:  Provider performing procedure: [place]rum Diagnosis:  Lung mass Which side if for nodule / mass?  Left Procedure:  Robotic assisted navigational bronchoscopy with biopsies  Has patient been spoken to by Provider and given informed consent? Yes, Lauraine Lites NP Anesthesia:  General anesthesia Do you need Fluro?  Yes Duration of procedure:  1.5  Date:  02/19/2024 Alternate Date: 02/25/2024  Time: Last slot that is available Location:  [facility] Does patient have OSA?  Yes DM? No Or Latex allergy?  No Medication Restriction/ Anticoagulate/Antiplatelet:  Baby ASA, hold x 2 days before procedure.  Pre-op Labs Ordered:determined by Anesthesia Imaging request: LDCT was done 01/26/2024  (If, SuperDimension CT Chest, please have STAT courier sent to ENDO)

## 2024-02-12 NOTE — Patient Instructions (Signed)
 Full PFT performed today.

## 2024-02-14 DIAGNOSIS — Z96611 Presence of right artificial shoulder joint: Secondary | ICD-10-CM | POA: Diagnosis not present

## 2024-02-14 DIAGNOSIS — M7542 Impingement syndrome of left shoulder: Secondary | ICD-10-CM | POA: Diagnosis not present

## 2024-02-18 ENCOUNTER — Other Ambulatory Visit: Payer: Self-pay

## 2024-02-18 ENCOUNTER — Ambulatory Visit (HOSPITAL_COMMUNITY)
Admission: RE | Admit: 2024-02-18 | Discharge: 2024-02-18 | Disposition: A | Discharge status: Home / Self Care | Source: Ambulatory Visit | Attending: Acute Care | Admitting: Acute Care

## 2024-02-18 ENCOUNTER — Encounter (HOSPITAL_COMMUNITY): Payer: Self-pay | Admitting: Emergency Medicine

## 2024-02-18 DIAGNOSIS — R918 Other nonspecific abnormal finding of lung field: Secondary | ICD-10-CM | POA: Insufficient documentation

## 2024-02-18 DIAGNOSIS — R911 Solitary pulmonary nodule: Secondary | ICD-10-CM | POA: Diagnosis not present

## 2024-02-18 LAB — GLUCOSE, CAPILLARY: Glucose-Capillary: 84 mg/dL (ref 70–99)

## 2024-02-18 MED ORDER — FLUDEOXYGLUCOSE F - 18 (FDG) INJECTION
9.5370 | Freq: Once | INTRAVENOUS | Status: AC
  Administered 2024-02-18: 9.537 via INTRAVENOUS

## 2024-02-18 NOTE — Anesthesia Preprocedure Evaluation (Signed)
 Anesthesia Evaluation  Patient identified by MRN, date of birth, ID band Patient awake    Reviewed: Allergy & Precautions, NPO status , Patient's Chart, lab work & pertinent test results  Airway Mallampati: I  TM Distance: >3 FB Neck ROM: Full    Dental  (+) Edentulous Upper, Dental Advisory Given   Pulmonary sleep apnea and Continuous Positive Airway Pressure Ventilation , COPD,  COPD inhaler, Current Smoker and Patient abstained from smoking.   Pulmonary exam normal breath sounds clear to auscultation       Cardiovascular hypertension, Pt. on medications + angina  + CAD and +CHF  Normal cardiovascular exam Rhythm:Regular Rate:Normal  Echo 08/13/23: IMPRESSIONS  1. Left ventricular ejection fraction, by estimation, is 55 to 60%. The  left ventricle has normal function. The left ventricle has no regional  wall motion abnormalities. Left ventricular diastolic parameters were  normal.  2. Right ventricular systolic function is normal. The right ventricular  size is normal.  3. The mitral valve is normal in structure. Trivial mitral valve  regurgitation. No evidence of mitral stenosis.  4. The aortic valve is normal in structure. Aortic valve regurgitation is  trivial. No aortic stenosis is present.  5. The inferior vena cava is normal in size with greater than 50%  respiratory variability, suggesting right atrial pressure of 3 mmHg.  - Comparison(s): Echocardiogram done 06/11/13 showed an EF of 45-50%.      Neuro/Psych negative neurological ROS  negative psych ROS   GI/Hepatic Neg liver ROS,GERD  ,,  Endo/Other  negative endocrine ROS    Renal/GU negative Renal ROS  negative genitourinary   Musculoskeletal  (+) Arthritis ,    Abdominal   Peds  Hematology negative hematology ROS (+)   Anesthesia Other Findings   Reproductive/Obstetrics                              Anesthesia  Physical Anesthesia Plan  ASA: 3  Anesthesia Plan: General   Post-op Pain Management: Minimal or no pain anticipated   Induction: Intravenous  PONV Risk Score and Plan: 1 and Midazolam , Dexamethasone , Ondansetron  and Treatment may vary due to age or medical condition  Airway Management Planned: Oral ETT  Additional Equipment:   Intra-op Plan:   Post-operative Plan: Extubation in OR  Informed Consent: I have reviewed the patients History and Physical, chart, labs and discussed the procedure including the risks, benefits and alternatives for the proposed anesthesia with the patient or authorized representative who has indicated his/her understanding and acceptance.     Dental advisory given  Plan Discussed with: CRNA  Anesthesia Plan Comments: (PAT note written 02/18/2024 by Nalanie Winiecki, PA-C.  )         Anesthesia Quick Evaluation

## 2024-02-18 NOTE — Progress Notes (Signed)
 Anesthesia Chart Review: Charles Grimes  Case: [id number] Date/Time: 02/19/24 1445   Procedure: VIDEO BRONCHOSCOPY WITH ENDOBRONCHIAL NAVIGATION (Left)   Anesthesia type: General   Diagnosis: Lung mass [R91.8]   Pre-op diagnosis: lung mass   [facility] / [facility]   Surgeons: Shelah Lamar RAMAN, MD       DISCUSSION: Patient is a 68 year old male scheduled for the above procedure.  History includes smoking, HTN, dyslipidemia, family history of premature CAD, CAD (chest pain 04/2013->40% OM1 2015), non-ischemic cardiomyopathy (EF ), GERD, anxiety, depression, OSA (mostly compliant with CPAP), spinal surgery (right L3-4 discectomy 10/07/14), right rotator cuff insufficiency (s/p right shoulder hemi-arthroplasty 05/28/15), intussusception (overnight admission 05/17/23), pancreatitis (03/2021, possible medication induced).  He was followed by cardiologist Dr. Alm Clay from ~ 05/2013 - 07/2014 for exertional dyspnea and chest pain with family history of premature CAD. He had a non-ischemic stress test in 05/2013, but his EF was mildly depressed at 44%. (45-50% with mild anterior hypokinesis by TTE). He went on to have a LHC on 08/26/13 that did 40-50% OM1, luminal irregularities in LAD/DIAG, upper normal LVEDP. No real HF symptoms at that time, and exertional dyspnea attributed more to his smoking and pulmonary issues. He was not on b-blocker due to fatigue, depression, and concern for exacerbating his pulmonary symptoms. HTN controlled on ARB. Other than low HDL, dyslipidemia was at goal on statin therapy. One year follow-up had been planned.  Current meds include aspirin  81 mg, Lipitor 10 mg daily, lisinopril  10 mg daily.  He had been followed by PCP [name], DO since 12/18/16. He ordered an echo in 06/2023 following ED visit for BLE edema requiring diuretic therapy.  08/13/23 TTE showed LVEF 55-60%, no RWMA, normal systolic parameters, normal RV systolic function,  normal RV size, trivial MR, trivial AR. Per 01/14/24 viist, he is compliant with medications, active in yard, and denied chest pain and SOB. A chest CT LCS was ordered and showed severe emphysema, moderate coronary calcifications, morphologic changes in keeping with pulmonary artery hypertension, highly suspicious 5.5 x 3.8 cm let suprahilar mass. He was referred to pulmonology.  Of note, he was seen by ENT Dr. Elena Larry on 01/31/24 for a left buccal mucosal lesion with pending biopsy.   02/18/24 PET scan report is in process.  He has a same-day workup. Overall mild CAD in 2014. He is not currently followed by cardiology. He did have echo in January 2025 per PCP with recent follow-up as outlined. Now with a highly suspicious lung amass.  Anesthesia team to evaluate on the day of surgery.  He reported last aspirin  dose 02/16/2024.   VS:  Wt Readings from Last 3 Encounters:  02/12/24 85.4 kg  01/14/24 84.6 kg  11/12/23 87.3 kg   BP Readings from Last 3 Encounters:  02/12/24 135/80  01/31/24 126/78  01/14/24 122/68   Pulse Readings from Last 3 Encounters:  02/12/24 (!) 54  01/31/24 (!) 53  01/14/24 85    PROVIDERS: [name], DO is PCP   LABS: For surgery as indicated.  Last results in [facility] include: Lab Results  Component Value Date   WBC 10.1 07/06/2023   HGB 12.3 (L) 07/06/2023   HCT 37.8 (L) 07/06/2023   PLT 158 07/06/2023   GLUCOSE 98 01/14/2024   CHOL 166 01/14/2024   TRIG 126.0 01/14/2024   HDL 33.50 (L) 01/14/2024   LDLDIRECT 104 (H) 06/10/2014   LDLCALC 107 (H) 01/14/2024   ALT  11 01/14/2024   AST 16 01/14/2024   NA 139 01/14/2024   K 4.3 01/14/2024   CL 106 01/14/2024   CREATININE 1.03 01/14/2024   BUN 18 01/14/2024   CO2 25 01/14/2024   TSH 1.76 06/10/2014   PSA 0.74 04/10/2023   INR 0.97 04/30/2015   HGBA1C 5.9 10/25/2015    PFTs 02/12/24: FVC 4.97 (105%), post 5.32 (113%). FEV1 3.29 (94%), post 3.60 (103%). DLCO unc/cor 12.13  (44%).   IMAGES: PET Scan 02/18/24: In process.   CT Chest LCS 01/21/24: IMPRESSION: - Lung-RADS 4X, highly suspicious. Additional imaging evaluation with dedicated contrast enhanced chest CT or PET CT examination and consultation with Pulmonology or Thoracic Surgery recommended. These results will be called to the ordering clinician or representative by the Radiologist Assistant, and communication documented in the PACS or [employer]. - Moderate coronary artery calcification. Morphologic changes in keeping with pulmonary arterial hypertension. Severe emphysema - Aortic Atherosclerosis (ICD10-I70.0) and Emphysema (ICD10-J43.9).      EKG: EKG 07/06/23: Sinus rhythm Borderline right axis deviation Borderline prolonged QT interval No significant change was found Confirmed by Carita Senior [phone]) on 07/06/2023 11:55:25 PM   CV: Echo 08/13/23: IMPRESSIONS   1. Left ventricular ejection fraction, by estimation, is 55 to 60%. The  left ventricle has normal function. The left ventricle has no regional  wall motion abnormalities. Left ventricular diastolic parameters were  normal.   2. Right ventricular systolic function is normal. The right ventricular  size is normal.   3. The mitral valve is normal in structure. Trivial mitral valve  regurgitation. No evidence of mitral stenosis.   4. The aortic valve is normal in structure. Aortic valve regurgitation is  trivial. No aortic stenosis is present.   5. The inferior vena cava is normal in size with greater than 50%  respiratory variability, suggesting right atrial pressure of 3 mmHg.  - Comparison(s): Echocardiogram done 06/11/13 showed an EF of 45-50%.    BLE Venous US  07/07/23: IMPRESSION: No evidence of deep venous thrombosis in either lower extremity.    Cardiac cath 08/26/13:  Left Ventriculography:  EF: ~ 45% Wall Motion: global hypokinesis  Coronary Anatomy: Left Main: Large caliber vessel, trifurcates into  LAD, Ramus Intermedius & Circumflex LAD: large caliber vessel that wraps the apex; minimal luminal irregularities. D1: small caliber vessel, minimal luminal irregularities   D2: Moderate caliber vessel, still proximal; minimal luminal irregularities Left Circumflex: Moderate to large caliber vessel that essentially becomes a single bifurcating Lateral OM.  Mid vessel diffuse 40-50% luminal irregularities; not significant. Ramus intermedius: Large caliber vessel (at least as large as LAD) that reaches the apex.  Angiographically normal. RCA: Large caliber, dominant vessel with 2 major RV marginal branches.  It bifurcates distally into the Right Posterior Descending Artery * the  Right Posterior AV Groove Branch (RPAV); angiographically normal. RPDA: Moderate caliber vessel; angiographically normal. RPL Sysytem:The RPAV begins as a moderate caliber vessel that bifurcates into 2 small to moderate caliber Right Posterolateral Branches; angiographically normal. IMPRESSION:  Angiographically Mild-Moderate single vessel CAD with ~40-60% mid OM1 disease.  Mildly reduced LVEF as previously noted with global hypokinesis.  Upper normal LVEDP. Continue current medication regimen.     Nuclear stress test 06/03/13:  Overall Impression:  Low risk stress nuclear study without evidence for ischemia. LV Wall Motion:  Mild global LV dysfunction, EF 44% without discreet wall motion abnormalities.   Past Medical History:  Diagnosis Date   Anginal pain (HCC)    Anxiety  Arthritis    Coronary artery disease, non-occlusive 08/2013   Mild to moderate (40% OM1) single vessel CAD. Otherwise nonobstructed.   Depression    Dyslipidemia, goal LDL below 100    Essential hypertension    Family history of premature CAD    GERD (gastroesophageal reflux disease)    H/O skin disorder    Involving hands. Subsequently resolved.    Nonischemic cardiomyopathy (HCC) 05/2013   Non-ischemic: EF ~45% by Echo & Myoview  -->  Non-obstructive CAD   Obesity (BMI 30.0-34.9)    OSA (obstructive sleep apnea) 06/21/2017   uses CPAP   Sleep apnea     Past Surgical History:  Procedure Laterality Date   COLONOSCOPY  2015   COLONOSCOPY WITH PROPOFOL   01/02/2017   Dr.Pyrtle   LEFT HEART CATHETERIZATION WITH CORONARY ANGIOGRAM  08/26/2013   Mild to moderate disease with 40-50% stenosis and OM1. Otherwise no significant CAD --  Surgeon: Alm LELON Clay, MD;  Location: [facility] CATH LAB;  Service: Cardiovascular;;   Lower extremity arterial Dopplers  06/11/2013   No occlusive disease   LUMBAR LAMINECTOMY/DECOMPRESSION MICRODISCECTOMY Right 10/07/2014   Procedure: LUMBAR LAMINECTOMY/DECOMPRESSION MICRODISCECTOMY 1 LEVEL;  Surgeon: Arley Helling, MD;  [facility];  Service: Neurosurgery;  Laterality: Right;  Right L3-L4 Microdiscectomy   NM MYOVIEW  LTD  06/03/2013   Low risk study with no ischemia. EF roughly 44% with no regional wall motion abnormalities noted   PFTs  06/16/2013   Normal Volumes &  Spirometry; Moderately reduced DLCO   POLYPECTOMY     SHOULDER HEMI-ARTHROPLASTY Right 05/28/2015   Procedure: RIGHT SHOULDER HEMI-ARTHROPLASTY CTA HEAD AND SUBSCAP REPAIR ;  Surgeon: Marcey Her, MD;  Location[facility] OR;  Service: Orthopedics;  Laterality: Right;   TRANSTHORACIC ECHOCARDIOGRAM  06/11/2013   Mildly reduced EF: 45-50%. Mild anterior hypokinesis with incoordinate septal motion. No evidence of pulmonary hypertension    MEDICATIONS: No current facility-administered medications for this encounter.    ALPRAZolam  (XANAX ) 0.5 MG tablet   aspirin  EC 81 MG tablet   atorvastatin  (LIPITOR) 10 MG tablet   budesonide-glycopyrrolate -formoterol (BREZTRI  AEROSPHERE) 160-9-4.8 MCG/ACT AERO inhaler   colchicine  0.6 MG tablet   lisinopril  (ZESTRIL ) 10 MG tablet   nitroGLYCERIN  (NITROSTAT ) 0.4 MG SL tablet   NON FORMULARY   omeprazole  (PRILOSEC ) 20 MG capsule   sertraline  (ZOLOFT ) 50 MG tablet   tiZANidine  (ZANAFLEX ) 4 MG  tablet   Multiple Vitamins-Minerals (CENTRUM SILVER 50+MEN) TABS   triamcinolone  cream (KENALOG ) 0.1 %    Charles Ruder, PA-C Surgical Short Stay/Anesthesiology [facility] Phone [phone] [facility] Phone [phone] 02/18/2024 2:26 PM

## 2024-02-18 NOTE — Progress Notes (Signed)
 SDW CALL  Patient was given pre-op instructions over the phone. Patient verbalized understanding of instructions provided.   PCP - Dr. Frann Cardiologist - Has not needed to see one in years   Chest x-ray -  EKG - 07/06/23 Stress Test - 06/04/23 ECHO - 08/13/23 Cardiac Cath - 08/26/13  Sleep Study - 2018 CPAP - Wears nightly except last month as had trouble wearing, states needs it re-adjusted   Blood Thinner Instructions: Aspirin  Instructions: reports told to Hold 2 days, LD Saturday 8/2  ERAS Protcol - Clears until 11:45am PRE-SURGERY Ensure or G2-   Number to Pharmacy Call Center provided  Anesthesia review: sent for review, previous heart hx  Patient denies shortness of breath, fever, cough and chest pain over the phone call

## 2024-02-19 ENCOUNTER — Ambulatory Visit (HOSPITAL_COMMUNITY): Payer: Self-pay | Admitting: Vascular Surgery

## 2024-02-19 ENCOUNTER — Encounter (HOSPITAL_COMMUNITY)
Admission: RE | Disposition: A | Discharge status: Home / Self Care | Payer: Self-pay | Source: Home / Self Care | Attending: Emergency Medicine

## 2024-02-19 ENCOUNTER — Ambulatory Visit (HOSPITAL_COMMUNITY)

## 2024-02-19 ENCOUNTER — Encounter (HOSPITAL_COMMUNITY): Payer: Self-pay | Admitting: Emergency Medicine

## 2024-02-19 ENCOUNTER — Ambulatory Visit (HOSPITAL_COMMUNITY)
Admission: RE | Admit: 2024-02-19 | Discharge: 2024-02-19 | Disposition: A | Discharge status: Home / Self Care | Attending: Emergency Medicine | Admitting: Emergency Medicine

## 2024-02-19 DIAGNOSIS — Z801 Family history of malignant neoplasm of trachea, bronchus and lung: Secondary | ICD-10-CM | POA: Insufficient documentation

## 2024-02-19 DIAGNOSIS — E669 Obesity, unspecified: Secondary | ICD-10-CM | POA: Insufficient documentation

## 2024-02-19 DIAGNOSIS — R590 Localized enlarged lymph nodes: Secondary | ICD-10-CM | POA: Diagnosis not present

## 2024-02-19 DIAGNOSIS — E785 Hyperlipidemia, unspecified: Secondary | ICD-10-CM | POA: Insufficient documentation

## 2024-02-19 DIAGNOSIS — G4733 Obstructive sleep apnea (adult) (pediatric): Secondary | ICD-10-CM | POA: Insufficient documentation

## 2024-02-19 DIAGNOSIS — C3412 Malignant neoplasm of upper lobe, left bronchus or lung: Secondary | ICD-10-CM | POA: Insufficient documentation

## 2024-02-19 DIAGNOSIS — Z8249 Family history of ischemic heart disease and other diseases of the circulatory system: Secondary | ICD-10-CM | POA: Diagnosis not present

## 2024-02-19 DIAGNOSIS — I10 Essential (primary) hypertension: Secondary | ICD-10-CM | POA: Diagnosis not present

## 2024-02-19 DIAGNOSIS — Z6825 Body mass index (BMI) 25.0-25.9, adult: Secondary | ICD-10-CM | POA: Insufficient documentation

## 2024-02-19 DIAGNOSIS — I428 Other cardiomyopathies: Secondary | ICD-10-CM | POA: Insufficient documentation

## 2024-02-19 DIAGNOSIS — I2510 Atherosclerotic heart disease of native coronary artery without angina pectoris: Secondary | ICD-10-CM

## 2024-02-19 DIAGNOSIS — Z419 Encounter for procedure for purposes other than remedying health state, unspecified: Secondary | ICD-10-CM

## 2024-02-19 DIAGNOSIS — K219 Gastro-esophageal reflux disease without esophagitis: Secondary | ICD-10-CM | POA: Diagnosis not present

## 2024-02-19 DIAGNOSIS — F17210 Nicotine dependence, cigarettes, uncomplicated: Secondary | ICD-10-CM | POA: Diagnosis not present

## 2024-02-19 DIAGNOSIS — J449 Chronic obstructive pulmonary disease, unspecified: Secondary | ICD-10-CM

## 2024-02-19 DIAGNOSIS — R918 Other nonspecific abnormal finding of lung field: Secondary | ICD-10-CM

## 2024-02-19 HISTORY — PX: VIDEO BRONCHOSCOPY WITH ENDOBRONCHIAL ULTRASOUND: SHX6177

## 2024-02-19 LAB — CBC
HCT: 45.6 % (ref 39.0–52.0)
Hemoglobin: 14.1 g/dL (ref 13.0–17.0)
MCH: 28.4 pg (ref 26.0–34.0)
MCHC: 30.9 g/dL (ref 30.0–36.0)
MCV: 91.9 fL (ref 80.0–100.0)
Platelets: 157 K/uL (ref 150–400)
RBC: 4.96 MIL/uL (ref 4.22–5.81)
RDW: 15.5 % (ref 11.5–15.5)
WBC: 9.9 K/uL (ref 4.0–10.5)
nRBC: 0 % (ref 0.0–0.2)

## 2024-02-19 LAB — BASIC METABOLIC PANEL WITH GFR
Anion gap: 11 (ref 5–15)
BUN: 19 mg/dL (ref 8–23)
CO2: 21 mmol/L — ABNORMAL LOW (ref 22–32)
Calcium: 8.4 mg/dL — ABNORMAL LOW (ref 8.9–10.3)
Chloride: 107 mmol/L (ref 98–111)
Creatinine, Ser: 0.99 mg/dL (ref 0.61–1.24)
GFR, Estimated: >60 mL/min (ref >60)
Glucose, Bld: 81 mg/dL (ref 70–99)
Potassium: 4.5 mmol/L (ref 3.5–5.1)
Sodium: 139 mmol/L (ref 135–145)

## 2024-02-19 SURGERY — BRONCHOSCOPY, WITH EBUS
Anesthesia: General | Laterality: Left

## 2024-02-19 SURGERY — VIDEO BRONCHOSCOPY WITH ENDOBRONCHIAL NAVIGATION
Anesthesia: General | Laterality: Left

## 2024-02-19 MED ORDER — LIDOCAINE 2% (20 MG/ML) 5 ML SYRINGE
INTRAMUSCULAR | Status: DC | PRN
  Administered 2024-02-19: 60 mg via INTRAVENOUS

## 2024-02-19 MED ORDER — OXYCODONE HCL 5 MG/5ML PO SOLN: 5.0000 mg | Freq: Once | ORAL | Status: DC | PRN

## 2024-02-19 MED ORDER — CHLORHEXIDINE GLUCONATE 0.12 % MT SOLN
OROMUCOSAL | Status: AC
Start: 2024-02-19 — End: 2024-02-19
  Administered 2024-02-19: 15 mL
  Filled 2024-02-19: qty 15

## 2024-02-19 MED ORDER — OXYCODONE HCL 5 MG PO TABS: 5.0000 mg | ORAL_TABLET | Freq: Once | ORAL | Status: DC | PRN

## 2024-02-19 MED ORDER — EPINEPHRINE PF 1 MG/ML IJ SOLN
INTRAMUSCULAR | Status: DC | PRN
  Administered 2024-02-19: 4 mL via ENDOTRACHEOPULMONARY
  Administered 2024-02-19: 2 mL via ENDOTRACHEOPULMONARY

## 2024-02-19 MED ORDER — DEXAMETHASONE SODIUM PHOSPHATE 10 MG/ML IJ SOLN
INTRAMUSCULAR | Status: DC | PRN
  Administered 2024-02-19: 10 mg via INTRAVENOUS

## 2024-02-19 MED ORDER — COLCHICINE 0.6 MG PO TABS: 0.6000 mg | ORAL_TABLET | Freq: Every day | ORAL | Status: AC | PRN

## 2024-02-19 MED ORDER — EPINEPHRINE 1 MG/10ML IJ SOSY
PREFILLED_SYRINGE | INTRAMUSCULAR | Status: AC
  Filled 2024-02-19: qty 10

## 2024-02-19 MED ORDER — SUGAMMADEX SODIUM 200 MG/2ML IV SOLN
INTRAVENOUS | Status: DC | PRN
  Administered 2024-02-19: 300 mg via INTRAVENOUS

## 2024-02-19 MED ORDER — FENTANYL CITRATE (PF) 100 MCG/2ML IJ SOLN: 25.0000 ug | INTRAMUSCULAR | Status: DC | PRN

## 2024-02-19 MED ORDER — AMISULPRIDE (ANTIEMETIC) 5 MG/2ML IV SOLN: 10.0000 mg | Freq: Once | INTRAVENOUS | Status: DC | PRN

## 2024-02-19 MED ORDER — ONDANSETRON HCL 4 MG/2ML IJ SOLN
INTRAMUSCULAR | Status: DC | PRN
  Administered 2024-02-19: 4 mg via INTRAVENOUS

## 2024-02-19 MED ORDER — LACTATED RINGERS IV SOLN: INTRAVENOUS | Status: DC

## 2024-02-19 MED ORDER — PROPOFOL 10 MG/ML IV BOLUS
INTRAVENOUS | Status: DC | PRN
  Administered 2024-02-19: 50 mg via INTRAVENOUS
  Administered 2024-02-19: 150 mg via INTRAVENOUS

## 2024-02-19 MED ORDER — ROCURONIUM BROMIDE 10 MG/ML (PF) SYRINGE
PREFILLED_SYRINGE | INTRAVENOUS | Status: DC | PRN
  Administered 2024-02-19: 70 mg via INTRAVENOUS

## 2024-02-19 MED ORDER — PROPOFOL 500 MG/50ML IV EMUL
INTRAVENOUS | Status: DC | PRN
  Administered 2024-02-19: 125 ug/kg/min via INTRAVENOUS

## 2024-02-19 SURGICAL SUPPLY — 36 items
ADAPTER BRONCHOSCOPE OLYMPUS (ADAPTER) ×1 IMPLANT
ADAPTER VALVE BIOPSY EBUS (MISCELLANEOUS) IMPLANT
BAG COUNTER SPONGE SURGICOUNT (BAG) ×1 IMPLANT
BRUSH CYTOL CELLEBRITY 1.5X140 (MISCELLANEOUS) ×1 IMPLANT
BRUSH SUPERTRAX BIOPSY (INSTRUMENTS) IMPLANT
BRUSH SUPERTRAX NDL-TIP CYTO (INSTRUMENTS) ×1 IMPLANT
CANISTER SUCTION 3000ML PPV (SUCTIONS) ×1 IMPLANT
CNTNR URN SCR LID CUP LEK RST (MISCELLANEOUS) ×1 IMPLANT
COVER BACK TABLE 60X90IN (DRAPES) ×1 IMPLANT
FILTER STRAW FLUID ASPIR (MISCELLANEOUS) IMPLANT
FORCEPS BIOP 1.5 SINGLE USE (MISCELLANEOUS) ×1 IMPLANT
FORCEPS BIOP SUPERTRX PREMAR (INSTRUMENTS) ×1 IMPLANT
GAUZE SPONGE 4X4 12PLY STRL (GAUZE/BANDAGES/DRESSINGS) ×1 IMPLANT
GLOVE BIO SURGEON STRL SZ7.5 (GLOVE) ×2 IMPLANT
GOWN STRL REUS W/ TWL LRG LVL3 (GOWN DISPOSABLE) ×2 IMPLANT
KIT CLEAN ENDO COMPLIANCE (KITS) ×1 IMPLANT
KIT LOCATABLE GUIDE (CANNULA) IMPLANT
KIT MARKER FIDUCIAL DELIVERY (KITS) IMPLANT
KIT TURNOVER KIT B (KITS) ×1 IMPLANT
MARKER SKIN DUAL TIP RULER LAB (MISCELLANEOUS) ×1 IMPLANT
NDL SUPERTRX PREMARK BIOPSY (NEEDLE) ×1 IMPLANT
NEEDLE SUPERTRX PREMARK BIOPSY (NEEDLE) ×1 IMPLANT
NS IRRIG 1000ML POUR BTL (IV SOLUTION) ×1 IMPLANT
OIL SILICONE PENTAX (PARTS (SERVICE/REPAIRS)) ×1 IMPLANT
PAD ARMBOARD POSITIONER FOAM (MISCELLANEOUS) ×2 IMPLANT
PATCHES PATIENT (LABEL) ×3 IMPLANT
SYR 20ML ECCENTRIC (SYRINGE) ×1 IMPLANT
SYR 20ML LL LF (SYRINGE) ×1 IMPLANT
SYR 50ML SLIP (SYRINGE) ×1 IMPLANT
TOWEL GREEN STERILE FF (TOWEL DISPOSABLE) ×1 IMPLANT
TRAP SPECIMEN MUCUS 40CC (MISCELLANEOUS) IMPLANT
TUBE CONNECTING 20X1/4 (TUBING) ×1 IMPLANT
UNDERPAD 30X36 HEAVY ABSORB (UNDERPADS AND DIAPERS) ×1 IMPLANT
VALVE BIOPSY SINGLE USE (MISCELLANEOUS) ×1 IMPLANT
VALVE SUCTION BRONCHIO DISP (MISCELLANEOUS) ×1 IMPLANT
WATER STERILE IRR 1000ML POUR (IV SOLUTION) ×1 IMPLANT

## 2024-02-19 NOTE — Op Note (Signed)
 Video Bronchoscopy with Endobronchial Ultrasound Procedure Note  Date of Operation: 02/19/2024  Pre-op Diagnosis: Left hilar mass, mediastinal adenopathy  Post-op Diagnosis: Same  Surgeon: LAMAR CHRIS  Assistants: None  Anesthesia: General endotracheal anesthesia  Operation: Flexible video fiberoptic bronchoscopy with endobronchial ultrasound and biopsies.  Estimated Blood Loss: Minimal  Complications: None apparent  Indications and History: Charles Grimes is a 68 y.o. male with history of tobacco use who was found to have a left hilar mass on lung cancer screening CT chest.  Recommendation made to achieve a tissue diagnosis via endobronchial ultrasound with biopsies.  The risks, benefits, complications, treatment options and expected outcomes were discussed with the patient.  The possibilities of pneumothorax, pneumonia, reaction to medication, pulmonary aspiration, perforation of a viscus, bleeding, failure to diagnose a condition and creating a complication requiring transfusion or operation were discussed with the patient who freely signed the consent.    Description of Procedure: The patient was examined in the preoperative area and history and data from the preprocedure consultation were reviewed. It was deemed appropriate to proceed.  The patient was taken to [facility] room 3, identified as Charles Grimes and the procedure verified as Flexible Video Fiberoptic Bronchoscopy.  A Time Out was held and the above information confirmed. After being taken to the operating room general anesthesia was initiated and the patient  was orally intubated. The video fiberoptic bronchoscope was introduced via the endotracheal tube and a general inspection was performed which showed normal airways on the right.  The left lower lobe airways were normal as well.  There was a vascular, white raised somewhat friable endobronchial lesion completely occluding single left upper lobe segmental  bronchus.  Endobronchial brushings and endobronchial forceps biopsies were obtained from the left upper lobe mass lesion.  Cryoprobe biopsies were attempted, but the angle made successful tissue sampling difficult so this was abandoned. The standard scope was then withdrawn and the endobronchial ultrasound was used to identify and characterize the peritracheal, hilar and bronchial lymph nodes. Inspection showed a normal size node at station 4L that was too small to sample.  There were 2 enlarged nodes at station 4R, approximately 1.7 and 2.0 cm. Using real-time ultrasound guidance Wang needle biopsies were take from Station 4R nodes and were sent for cytology. The patient tolerated the procedure well without apparent complications. There was no significant blood loss. The bronchoscope was withdrawn. Anesthesia was reversed and the patient was taken to the PACU for recovery.   Samples: 1. Wang needle biopsies from 4R nodes 2.  Endobronchial biopsies from left upper lobe mass 3.  Endobronchial brushings from left upper lobe mass   Plans:  The patient will be discharged from the PACU to home when recovered from anesthesia. We will review the cytology, pathology and microbiology results with the patient when they become available. Outpatient followup will be with Charles Lites, NP.    CHRIS LAMAR S. 02/19/2024

## 2024-02-19 NOTE — Discharge Instructions (Addendum)

## 2024-02-19 NOTE — Anesthesia Procedure Notes (Signed)
 Procedure Name: Intubation Date/Time: 02/19/2024 2:14 PM  Performed by: Elby Raelene SAUNDERS, CRNAPre-anesthesia Checklist: Patient identified, Emergency Drugs available, Suction available and Patient being monitored Patient Re-evaluated:Patient Re-evaluated prior to induction Oxygen Delivery Method: Circle System Utilized Preoxygenation: Pre-oxygenation with 100% oxygen Induction Type: IV induction Ventilation: Mask ventilation without difficulty Tube type: Oral Tube size: 8.5 mm Number of attempts: 1 Airway Equipment and Method: Stylet and Oral airway Placement Confirmation: ETT inserted through vocal cords under direct vision, positive ETCO2 and breath sounds checked- equal and bilateral Secured at: 23 cm Tube secured with: Tape Dental Injury: Teeth and Oropharynx as per pre-operative assessment

## 2024-02-19 NOTE — H&P (Signed)
 Charles Grimes is an 68 y.o. male.   Chief Complaint: Left hilar mass HPI: 67 year old male with history of tobacco use, OSA on CPAP, CAD, nonischemic cardiomyopathy, COPD.  He was found to have a left upper lobe abnormality on lung cancer screening CT scan of the chest that was done on 01/21/2024.  This prompted PET scan that showed associated hypermetabolism without any hypermetabolic mediastinal adenopathy.  There was a small 4L node.  He has not been having a chest discomfort, dyspnea.  Presents now for bronchoscopy for biopsies.  Past Medical History:  Diagnosis Date   Anginal pain (HCC)    Anxiety    Arthritis    Coronary artery disease, non-occlusive 08/2013   Mild to moderate (40% OM1) single vessel CAD. Otherwise nonobstructed.   Depression    Dyslipidemia, goal LDL below 100    Essential hypertension    Family history of premature CAD    GERD (gastroesophageal reflux disease)    H/O skin disorder    Involving hands. Subsequently resolved.    Nonischemic cardiomyopathy (HCC) 05/2013   Non-ischemic: EF ~45% by Echo & Myoview  --> Non-obstructive CAD   Obesity (BMI 30.0-34.9)    OSA (obstructive sleep apnea) 06/21/2017   uses CPAP   Sleep apnea     Past Surgical History:  Procedure Laterality Date   COLONOSCOPY  2015   COLONOSCOPY WITH PROPOFOL   01/02/2017   Dr.Pyrtle   LEFT HEART CATHETERIZATION WITH CORONARY ANGIOGRAM  08/26/2013   Mild to moderate disease with 40-50% stenosis and OM1. Otherwise no significant CAD --  Surgeon: Alm LELON Clay, MD;  Location: [facility] CATH LAB;  Service: Cardiovascular;;   Lower extremity arterial Dopplers  06/11/2013   No occlusive disease   LUMBAR LAMINECTOMY/DECOMPRESSION MICRODISCECTOMY Right 10/07/2014   Procedure: LUMBAR LAMINECTOMY/DECOMPRESSION MICRODISCECTOMY 1 LEVEL;  Surgeon: Arley Helling, MD;  [facility];  Service: Neurosurgery;  Laterality: Right;  Right L3-L4 Microdiscectomy   NM MYOVIEW  LTD  06/03/2013   Low risk study  with no ischemia. EF roughly 44% with no regional wall motion abnormalities noted   PFTs  06/16/2013   Normal Volumes &  Spirometry; Moderately reduced DLCO   POLYPECTOMY     SHOULDER HEMI-ARTHROPLASTY Right 05/28/2015   Procedure: RIGHT SHOULDER HEMI-ARTHROPLASTY CTA HEAD AND SUBSCAP REPAIR ;  Surgeon: Marcey Her, MD;  [facility] OR;  Service: Orthopedics;  Laterality: Right;   TRANSTHORACIC ECHOCARDIOGRAM  06/11/2013   Mildly reduced EF: 45-50%. Mild anterior hypokinesis with incoordinate septal motion. No evidence of pulmonary hypertension    Family History  Problem Relation Age of Onset   Atrial fibrillation Mother        Alive at 42   COPD Mother    Hypertension Mother    Colonic polyp Mother    Hypertension Father        Alive at 64   Lung cancer Father        Chronic smoker   Factor V Leiden deficiency Father        Also Lupus anticoagulant   Heart attack Maternal Grandmother 48   Heart failure Paternal Grandmother    Diabetes Paternal Grandmother    Heart attack Brother 24       X2   Heart attack Sister 53       Deceased   Healthy Sister        x2   Healthy Son        x2   Colon cancer Neg Hx  Esophageal cancer Neg Hx    Rectal cancer Neg Hx    Stomach cancer Neg Hx    Social History:  reports that he has been smoking cigarettes. He has a 42 pack-year smoking history. He has never used smokeless tobacco. He reports that he does not currently use alcohol. He reports that he does not use drugs.  Allergies:  Allergies  Allergen Reactions   Ibuprofen     GI Upset   Isosorbide  Nitrate Other (See Comments)    CONTINUOUS HEADACHE    Oxycodone  Itching    Reaction was to straight oxycodone  15 mg tablets - no reaction to percocet   Penicillins Hives and Rash    Has patient had a PCN reaction causing immediate rash, facial/tongue/throat swelling, SOB or lightheadedness with hypotension: Yes Has patient had a PCN reaction causing severe rash involving mucus  membranes or skin necrosis: No Has patient had a PCN reaction that required hospitalization No Has patient had a PCN reaction occurring within the last 10 years: No If all of the above answers are NO, then may proceed with Cephalosporin use.    Medications Prior to Admission  Medication Sig Dispense Refill   ALPRAZolam  (XANAX ) 0.5 MG tablet Take 1 tablet (0.5 mg total) by mouth at bedtime as needed for anxiety. 30 tablet 0   aspirin  EC 81 MG tablet Take 81 mg by mouth daily.     atorvastatin  (LIPITOR) 10 MG tablet Take 1 tablet (10 mg total) by mouth daily. 90 tablet 3   budesonide-glycopyrrolate -formoterol (BREZTRI  AEROSPHERE) 160-9-4.8 MCG/ACT AERO inhaler Inhale 2 puffs into the lungs in the morning and at bedtime.     colchicine  0.6 MG tablet Take 2 tabs at first sign of a flare and repeat 1 tab 1 hr later. Take 1 tab twice daily until flare resolves. (Patient taking differently: Take 0.6 mg by mouth daily as needed (gout flares).) 30 tablet 1   lisinopril  (ZESTRIL ) 10 MG tablet Take 1 tablet (10 mg total) by mouth daily. 90 tablet 3   nitroGLYCERIN  (NITROSTAT ) 0.4 MG SL tablet Place 1 tablet (0.4 mg total) under the tongue every 5 (five) minutes as needed for chest pain. 30 tablet 1   NON FORMULARY Pt uses a c-pap nightly     omeprazole  (PRILOSEC ) 20 MG capsule Take 1 capsule (20 mg total) by mouth daily. 90 capsule 3   sertraline  (ZOLOFT ) 50 MG tablet Take 2 tabs in the morning and 1 tab in the evening. 270 tablet 3   tiZANidine  (ZANAFLEX ) 4 MG tablet Take 1 tablet (4 mg total) by mouth at bedtime as needed for muscle spasms. 30 tablet 2   Multiple Vitamins-Minerals (CENTRUM SILVER 50+MEN) TABS Take 1 tablet by mouth daily.     triamcinolone  cream (KENALOG ) 0.1 % Apply 1 Application topically 2 (two) times daily as needed (itch). (Patient not taking: Reported on 02/18/2024) 30 g 1    Results for orders placed or performed during the hospital encounter of 02/18/24 (from the past 48 hours)   Glucose, capillary     Status: None   Collection Time: 02/18/24  1:27 PM  Result Value Ref Range   Glucose-Capillary 84 70 - 99 mg/dL    Comment: Glucose reference range applies only to samples taken after fasting for at least 8 hours.   No results found.  Review of Systems As per HPI Blood pressure (!) 165/89, pulse (!) 55, temperature 97.8 F (36.6 C), temperature source Oral, resp. rate 18, height 5' 11 (  1.803 m), weight 85.3 kg, SpO2 97%. Physical Exam  Gen: Pleasant, well-nourished, in no distress,  normal affect  ENT: No lesions, upper dentures, mouth clear,  oropharynx clear, no postnasal drip  Neck: No JVD, no stridor  Lungs: No use of accessory muscles, no crackles or wheezing on normal respiration, no wheeze on forced expiration  Cardiovascular: RRR, heart sounds normal, no murmur or gallops, no peripheral edema  Musculoskeletal: No deformities, no cyanosis or clubbing  Neuro: alert, awake, non focal  Skin: Warm, no lesions or rash    Assessment/Plan Left hilar mass and small 4L lymph node suspicious for malignancy and patient with a history of tobacco use.  Plan for robotic assisted navigational bronchoscopy, possible endobronchial ultrasound to obtain a tissue diagnosis.  Patient understands the risk, benefits, rationale of the procedure and agrees to proceed.  No barriers identified.  Lamar GORMAN Chris, MD 02/19/2024, 12:27 PM

## 2024-02-19 NOTE — Transfer of Care (Signed)
 Immediate Anesthesia Transfer of Care Note  Patient: Charles Grimes  Procedure(s) Performed: BRONCHOSCOPY, WITH EBUS (Left)  Patient Location: Endoscopy Unit  Anesthesia Type:General  Level of Consciousness: awake, alert , and oriented  Airway & Oxygen Therapy: Patient Spontanous Breathing and Patient connected to nasal cannula oxygen  Post-op Assessment: Report given to RN and Post -op Vital signs reviewed and stable  Post vital signs: Reviewed and stable  Last Vitals:  Vitals Value Taken Time  BP 117/74 02/19/24 15:20  Temp    Pulse 68 02/19/24 15:22  Resp 17 02/19/24 15:22  SpO2 97 % 02/19/24 15:22  Vitals shown include unfiled device data.  Last Pain:  Vitals:   02/19/24 1516  TempSrc:   PainSc: 0-No pain         Complications: No notable events documented.

## 2024-02-19 NOTE — Anesthesia Postprocedure Evaluation (Signed)
 Anesthesia Post Note  Patient: Charles Grimes  Procedure(s) Performed: BRONCHOSCOPY, WITH EBUS (Left)     Patient location during evaluation: PACU Anesthesia Type: General Level of consciousness: awake and alert, oriented and patient cooperative Pain management: pain level controlled Vital Signs Assessment: post-procedure vital signs reviewed and stable Respiratory status: spontaneous breathing, nonlabored ventilation and respiratory function stable Cardiovascular status: blood pressure returned to baseline and stable Postop Assessment: no apparent nausea or vomiting Anesthetic complications: no   No notable events documented.  Last Vitals:  Vitals:   02/19/24 1530 02/19/24 1540  BP: 125/80 139/83  Pulse: 65 60  Resp: 12 15  Temp:    SpO2: 98% 93%    Last Pain:  Vitals:   02/19/24 1530  TempSrc:   PainSc: 0-No pain                 [name]

## 2024-02-20 LAB — CYTOLOGY - NON PAP

## 2024-02-21 ENCOUNTER — Encounter (HOSPITAL_COMMUNITY): Payer: Self-pay | Admitting: Emergency Medicine

## 2024-02-21 LAB — CYTOLOGY - NON PAP

## 2024-02-22 ENCOUNTER — Other Ambulatory Visit (HOSPITAL_COMMUNITY)
Admission: RE | Admit: 2024-02-22 | Discharge: 2024-02-22 | Disposition: A | Discharge status: Home / Self Care | Source: Ambulatory Visit | Attending: Otolaryngology | Admitting: Otolaryngology

## 2024-02-22 ENCOUNTER — Ambulatory Visit (INDEPENDENT_AMBULATORY_CARE_PROVIDER_SITE_OTHER): Admitting: Otolaryngology

## 2024-02-22 ENCOUNTER — Encounter (INDEPENDENT_AMBULATORY_CARE_PROVIDER_SITE_OTHER): Payer: Self-pay | Admitting: Otolaryngology

## 2024-02-22 VITALS — BP 149/78 | HR 56

## 2024-02-22 DIAGNOSIS — Z720 Tobacco use: Secondary | ICD-10-CM

## 2024-02-22 DIAGNOSIS — K1370 Unspecified lesions of oral mucosa: Secondary | ICD-10-CM

## 2024-02-22 DIAGNOSIS — K1379 Other lesions of oral mucosa: Secondary | ICD-10-CM | POA: Diagnosis not present

## 2024-02-22 DIAGNOSIS — D490 Neoplasm of unspecified behavior of digestive system: Secondary | ICD-10-CM | POA: Insufficient documentation

## 2024-02-22 MED ORDER — CHLORHEXIDINE GLUCONATE 0.12 % MT SOLN: 15.0000 mL | Freq: Two times a day (BID) | OROMUCOSAL | 0 refills | Status: AC

## 2024-02-22 NOTE — Progress Notes (Signed)
 Procedure: Excision of buccal mucosal mass CPT 40810  ATTENDING: Elena Larry, M.D.   PREOPERATIVE DIAGNOSIS(ES):  mass along left buccal mucosa  POSTOPERATIVE DIAGNOSIS(ES): Same  PROCEDURE PERFORMED: excision of the  left buccal mucosal mass  INDICATIONS FOR PROCEDURE: Patient with hx of smoking and left buccal mucosal mass.  The risks and benefits of the surgical procedure have been explained in detail to the patient and they have elected to proceed.  CONSENT:  Informed consent was obtained prior to the procedure after discussion of risks, benefits, and alternatives and expected outcomes were discussed with the patient; consent placed in chart. The possibilities of reaction to medication, bleeding, infection, inadequate biopsy, the need for additional procedures, failure to diagnose a condition, and creating a complication requiring transfusion or operation were discussed with the patient. The patient concurred with the proposed plan, giving informed consent.    UNIVERSAL PROTOCOL/ TIMEOUT: Preprocedure verification is complete- patient verified and consents confirmed.  H&P REVIEW: The patient's history and physical were reviewed today prior to procedure. All medications were reviewed and updated as well.  ANESTHESIA: local anesthesia with 1% lidocaine , 1:100,000 epinephrine   PROCEDURE DETAILS: The patient was brought to the clinic and placed in a seated position.  The mucosa over the lesion was anesthetized.  A combination of #15 blade and fine scissors were used to remove the mass. The incision was made at the borders of the lesion.  It was sent for permanent pathology. Hemostasis was obtained using Afrin soaked pledgets and Silver Nitrate. There were no complications and the patient tolerated the procedure well.  ESTIMATED BLOOD LOSS: Minimal  SPECIMEN(S) REMOVED: left buccal mucosal mass  DISPOSITION OF SPECIMEN(S): Permanent pathology  FINDINGS:  No evidence of significant  bleeding or other complication  CONDITION: Stable  COMPLICATIONS:The patient tolerated the procedure well without apparent complications and was ambulatory.  NOTES: given rx for Peridex  mouthwash

## 2024-02-25 ENCOUNTER — Encounter: Payer: Self-pay | Admitting: Acute Care

## 2024-02-25 ENCOUNTER — Ambulatory Visit (INDEPENDENT_AMBULATORY_CARE_PROVIDER_SITE_OTHER): Admitting: Acute Care

## 2024-02-25 VITALS — BP 133/83 | HR 64 | Temp 97.7°F | Ht 72.0 in | Wt 188.2 lb

## 2024-02-25 DIAGNOSIS — B370 Candidal stomatitis: Secondary | ICD-10-CM

## 2024-02-25 DIAGNOSIS — R942 Abnormal results of pulmonary function studies: Secondary | ICD-10-CM

## 2024-02-25 DIAGNOSIS — F17210 Nicotine dependence, cigarettes, uncomplicated: Secondary | ICD-10-CM | POA: Diagnosis not present

## 2024-02-25 DIAGNOSIS — Z9889 Other specified postprocedural states: Secondary | ICD-10-CM

## 2024-02-25 DIAGNOSIS — C3490 Malignant neoplasm of unspecified part of unspecified bronchus or lung: Secondary | ICD-10-CM | POA: Diagnosis not present

## 2024-02-25 DIAGNOSIS — J42 Unspecified chronic bronchitis: Secondary | ICD-10-CM | POA: Diagnosis not present

## 2024-02-25 LAB — SURGICAL PATHOLOGY

## 2024-02-25 MED ORDER — CLOTRIMAZOLE 10 MG MT TROC: 10.0000 mg | Freq: Three times a day (TID) | OROMUCOSAL | 0 refills | Status: AC

## 2024-02-25 MED ORDER — ALBUTEROL SULFATE HFA 108 (90 BASE) MCG/ACT IN AERS: 2.0000 | INHALATION_SPRAY | Freq: Four times a day (QID) | RESPIRATORY_TRACT | 2 refills | Status: AC | PRN

## 2024-02-25 MED ORDER — BREZTRI AEROSPHERE 160-9-4.8 MCG/ACT IN AERO: 2.0000 | INHALATION_SPRAY | Freq: Two times a day (BID) | RESPIRATORY_TRACT | Status: AC

## 2024-02-25 MED ORDER — BREZTRI AEROSPHERE 160-9-4.8 MCG/ACT IN AERO: 2.0000 | INHALATION_SPRAY | Freq: Two times a day (BID) | RESPIRATORY_TRACT | 6 refills | Status: AC

## 2024-02-25 NOTE — Progress Notes (Signed)
 History of Present Illness Charles Grimes is a 68 y.o. male current every day smoker ( 42 pack year history) referred for an abnormal LDCT ( 4 X). He will be followed by Dr. Shelah.  Synopsis Pt with a 42 pack year smoking history referred for consult after an abnormal lung cancer screening scan, read as a 4 X for a 5.5 x 3.8 cm left suprahilar mass. PET scan was done as follow up that showed a Hypermetabolic LEFT suprahilar mass consistent with primary bronchogenic carcinoma,and Mildly enlarged subcarinal lymph node with mild metabolic activity which  is indeterminate for metastatic adenopathy. There was no evidence of distant metastatic disease. Pt. Underwent navigational bronchoscopy with biopsies on 02/19/2024. He is here today to ensure he has done well post procedure and also to discuss biopsy results a treatment options.       02/25/2024 Discussed the use of AI scribe software for clinical note transcription with the patient, who gave verbal consent to proceed.  History of Present Illness Charles Grimes is a 68 year old male with COPD who presents with a new diagnosis of non-small cell lung cancer per recent navigational bronchoscopy with biopsies. .  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 A recent biopsy confirmed the presence of malignant cells. A PET scan revealed mildly enlarged lymph nodes, but a biopsy of the 4R lymph node was negative for malignancy.  Pt. States he has done well after the procedure. No fever, discolored secretions, or worsening shortness of breath .He experienced a small amount of hemoptysis, which resolved after a day. There were no issues with anesthesia during the procedure.  He has a history of COPD with moderately reduced diffusion capacity. He uses a Breztri  inhaler, two puffs in the morning and evening, and reports some white mucus in the throat, likely  due to not rinsing after use. He also uses albuterol  as a rescue inhaler.  He does have oral thrush. I will send in Mycelex  Troches and encourage patient to brush his teeth and rinse his mouth with mouthwash after each dose.  He has a history of smoking but has recently quit and is using a nicotine  patch.He has not smoked x 6 weeks.  I will also order MR brain to complete staging.  Family are asking about the placement of valves for his COPD. I explained that we need to treat his cancer first, and then we can discuss other options for his COPD.     Test Results: Cytology 02/19/2024 FINAL MICROSCOPIC DIAGNOSIS:  A. LUNG, LUL MASS, BRUSHING:  - Malignant cells present.   B. LUNG, LUL MASS, FINE NEEDLE ASPIRATION  BIOPSY:  - Malignant cells present, favor poorly differentiated adenocarcinoma.   COMMENT:   Block B1 was interrogated with immunohistochemical (IHC) stains (TTF-1,  p40, CK5/6, chromogranin, synaptophysin, CD56).  The tumor cells have  weak to moderate TTF-1 reactivity and are negative for p40.  A few  positive CK5/6 cells are noted.  Neuroendocrine markers chromogranin and  synaptophysin are negative.  CD56 has partial positivity (nonspecific  marker in the absence of chromogranin and synaptophysin reactivity).  The controls are satisfactory.   Dr. Arminda has reviewed the slides and concurs.    FINAL MICROSCOPIC DIAGNOSIS:  C. LYMPH NODE, 4R, FINE NEEDLE ASPIRATION:   -No malignant cells identified  -Lymphoid cells present.   PET scan 02/18/2024 IMPRESSION: 1. Hypermetabolic LEFT suprahilar mass consistent with  primary bronchogenic carcinoma. 2. Mildly enlarged subcarinal lymph node with mild metabolic activity is indeterminate for metastatic adenopathy. 3. No evidence of distant metastatic disease. 4. Mild metabolic activity throughout the esophagus is favored inflammatory. 5.  Emphysema (ICD10-J43.9).  LDCT 01/21/2024 Lungs/Pleura: Pre-existing pulmonary nodules  are stable. See radiologist derived volumetric analysis. Severe emphysema. Interval development of a roughly 5.5 x 3.8 cm left suprahilar mass, not definitively characterized on this noncontrast examination. This lobulated mass obliterates the apicoposterior segmental bronchus of the left upper lobe. No pneumothorax or pleural effusion.  Lung-RADS 4X, highly suspicious. Additional imaging evaluation with dedicated contrast enhanced chest CT or PET CT examination and consultation with Pulmonology or Thoracic Surgery recommended. These results will be called to the ordering clinician or representative by the Radiologist Assistant, and communication documented in the PACS or [employer].  PFT 02/12/2024                    Latest Ref Rng & Units 02/19/2024   12:18 PM 07/06/2023   11:38 PM 05/25/2023   11:39 AM  CBC  WBC 4.0 - 10.5 K/uL 9.9  10.1  6.7   Hemoglobin 13.0 - 17.0 g/dL 85.8  87.6  86.1   Hematocrit 39.0 - 52.0 % 45.6  37.8  42.0   Platelets 150 - 400 K/uL 157  158  152.0        Latest Ref Rng & Units 02/19/2024   12:18 PM 01/14/2024    8:38 AM 07/10/2023   12:33 PM  BMP  Glucose 70 - 99 mg/dL 81  98  94   BUN 8 - 23 mg/dL 19  18  22    Creatinine 0.61 - 1.24 mg/dL 9.00  8.96  9.16   Sodium 135 - 145 mmol/L 139  139  139   Potassium 3.5 - 5.1 mmol/L 4.5  4.3  3.9   Chloride 98 - 111 mmol/L 107  106  104   CO2 22 - 32 mmol/L 21  25  28    Calcium  8.9 - 10.3 mg/dL 8.4  8.5  8.7     BNP    Component Value Date/Time   BNP 214.8 (H) 07/06/2023 2338    ProBNP No results found for: PROBNP  PFT    Component Value Date/Time   FEV1PRE 3.29 02/12/2024 1008   FEV1POST 3.60 02/12/2024 1008   FVCPRE 4.97 02/12/2024 1008   FVCPOST 5.32 02/12/2024 1008   TLC 7.65 02/12/2024 1008   DLCOUNC 12.13 02/12/2024 1008   PREFEV1FVCRT 66 02/12/2024 1008   PSTFEV1FVCRT 68 02/12/2024 1008    NM PET Image Initial (PI) Skull Base To Thigh Result Date:  02/25/2024 CLINICAL DATA:  Initial treatment strategy for pulmonary nodule. EXAM: NUCLEAR MEDICINE PET SKULL BASE TO THIGH TECHNIQUE: 9.5 mCi F-18 FDG was injected intravenously. Full-ring PET imaging was performed from the skull base to thigh after the radiotracer. CT data was obtained and used for attenuation correction and anatomic localization. Fasting blood glucose: 84 mg/dl COMPARISON:  None Available. FINDINGS: NECK: No hypermetabolic lymph nodes in the neck. Incidental CT findings: None. CHEST: LEFT suprahilar mass measuring 4.2 x 4.2 cm has intense metabolic activity with SUV max equal 18.9. Mild activity throughout the esophagus is favored inflammatory. Subcarinal lymph node is upper limits of normal in size measuring 10 mm short axis and with mild metabolic activity SUV max equal 3.9 (image 79). Incidental CT findings: Upper lobe centrilobular emphysema. Interstitial thickening and cystic change at the lung bases. ABDOMEN/PELVIS:  No abnormal hypermetabolic activity within the liver, pancreas, adrenal glands, or spleen. No hypermetabolic lymph nodes in the abdomen or pelvis. Incidental CT findings: None. SKELETON: No focal hypermetabolic activity to suggest skeletal metastasis. Incidental CT findings: None. IMPRESSION: 1. Hypermetabolic LEFT suprahilar mass consistent with primary bronchogenic carcinoma. 2. Mildly enlarged subcarinal lymph node with mild metabolic activity is indeterminate for metastatic adenopathy. 3. No evidence of distant metastatic disease. 4. Mild metabolic activity throughout the esophagus is favored inflammatory. 5.  Emphysema (ICD10-J43.9). Electronically Signed   By: Jackquline Boxer M.D.   On: 02/25/2024 10:54   DG C-Arm 1-60 Min-No Report Result Date: 02/19/2024 Fluoroscopy was utilized by the requesting physician.  No radiographic interpretation.     Past medical hx Past Medical History:  Diagnosis Date   Anginal pain (HCC)    Anxiety    Arthritis    Coronary artery  disease, non-occlusive 08/2013   Mild to moderate (40% OM1) single vessel CAD. Otherwise nonobstructed.   Depression    Dyslipidemia, goal LDL below 100    Essential hypertension    Family history of premature CAD    GERD (gastroesophageal reflux disease)    H/O skin disorder    Involving hands. Subsequently resolved.    Nonischemic cardiomyopathy (HCC) 05/2013   Non-ischemic: EF ~45% by Echo & Myoview  --> Non-obstructive CAD   Obesity (BMI 30.0-34.9)    OSA (obstructive sleep apnea) 06/21/2017   uses CPAP   Sleep apnea      Social History   Tobacco Use   Smoking status: Every Day    Current packs/day: 1.00    Average packs/day: 1 pack/day for 42.0 years (42.0 ttl pk-yrs)    Types: Cigarettes   Smokeless tobacco: Never   Tobacco comments:    3 cigarettes a day 02/12/2024 KRD  Vaping Use   Vaping status: Former   Substances: Flavoring  Substance Use Topics   Alcohol use: Not Currently    Comment: rarely   Drug use: No    Mr.Leavens reports that he has been smoking cigarettes. He has a 42 pack-year smoking history. He has never used smokeless tobacco. He reports that he does not currently use alcohol. He reports that he does not use drugs.  Tobacco Cessation: Ready to quit: Not Answered Counseling given: Not Answered Tobacco comments: 3 cigarettes a day 02/12/2024 KRD Using nicotine  patches.  Current every day smoker , I spent 3-4 minutes counseling patient on  steps to stop use of tobacco products. I have provided patient with information on receiving free nicotine  replacement therapy, and contact numbers for hypnosis for smoking cessation as well as acupuncture for smoking cessation.  Past surgical hx, Family hx, Social hx all reviewed.  Current Outpatient Medications on File Prior to Visit  Medication Sig   ALPRAZolam  (XANAX ) 0.5 MG tablet Take 1 tablet (0.5 mg total) by mouth at bedtime as needed for anxiety.   aspirin  EC 81 MG tablet Take 81 mg by mouth daily.    atorvastatin  (LIPITOR) 10 MG tablet Take 1 tablet (10 mg total) by mouth daily.   budesonide-glycopyrrolate -formoterol (BREZTRI  AEROSPHERE) 160-9-4.8 MCG/ACT AERO inhaler Inhale 2 puffs into the lungs in the morning and at bedtime.   chlorhexidine  (PERIDEX ) 0.12 % solution Use as directed 15 mLs in the mouth or throat 2 (two) times daily for 7 days.   lisinopril  (ZESTRIL ) 10 MG tablet Take 1 tablet (10 mg total) by mouth daily.   Multiple Vitamins-Minerals (CENTRUM SILVER 50+MEN) TABS Take 1 tablet by  mouth daily.   nitroGLYCERIN  (NITROSTAT ) 0.4 MG SL tablet Place 1 tablet (0.4 mg total) under the tongue every 5 (five) minutes as needed for chest pain.   NON FORMULARY Pt uses a c-pap nightly   omeprazole  (PRILOSEC ) 20 MG capsule Take 1 capsule (20 mg total) by mouth daily.   sertraline  (ZOLOFT ) 50 MG tablet Take 2 tabs in the morning and 1 tab in the evening.   tiZANidine  (ZANAFLEX ) 4 MG tablet Take 1 tablet (4 mg total) by mouth at bedtime as needed for muscle spasms.   triamcinolone  cream (KENALOG ) 0.1 % Apply 1 Application topically 2 (two) times daily as needed (itch).   colchicine  0.6 MG tablet Take 1 tablet (0.6 mg total) by mouth daily as needed (gout flares). (Patient not taking: Reported on 02/25/2024)   No current facility-administered medications on file prior to visit.     Allergies  Allergen Reactions   Ibuprofen     GI Upset   Isosorbide  Nitrate Other (See Comments)    CONTINUOUS HEADACHE    Oxycodone  Itching    Reaction was to straight oxycodone  15 mg tablets - no reaction to percocet   Penicillins Hives and Rash    Has patient had a PCN reaction causing immediate rash, facial/tongue/throat swelling, SOB or lightheadedness with hypotension: Yes Has patient had a PCN reaction causing severe rash involving mucus membranes or skin necrosis: No Has patient had a PCN reaction that required hospitalization No Has patient had a PCN reaction occurring within the last 10 years:  No If all of the above answers are NO, then may proceed with Cephalosporin use.    Review Of Systems:  Constitutional:   No  weight loss, night sweats,  Fevers, chills, fatigue, or  lassitude.  HEENT:   No headaches,  Difficulty swallowing,  Tooth/dental problems, or  Sore throat,                No sneezing, itching, ear ache, nasal congestion, post nasal drip,   CV:  No chest pain,  Orthopnea, PND, swelling in lower extremities, anasarca, dizziness, palpitations, syncope.   GI  No heartburn, indigestion, abdominal pain, nausea, vomiting, diarrhea, change in bowel habits, loss of appetite, bloody stools.   Resp: + shortness of breath with exertion or at rest.  + baseline  excess mucus, + baseline  productive cough,  No non-productive cough,  No coughing up of blood.  No change in color of mucus.  No wheezing.  No chest wall deformity  Skin: no rash or lesions.  GU: no dysuria, change in color of urine, no urgency or frequency.  No flank pain, no hematuria   MS:  No joint pain or swelling.  No decreased range of motion.  No back pain.  Psych:  No change in mood or affect. No depression or anxiety.  No memory loss.   Vital Signs BP 133/83   Pulse 64   Temp 97.7 F (36.5 C) (Oral)   Ht 6' (1.829 m)   Wt 188 lb 3.2 oz (85.4 kg)   SpO2 100%   BMI 25.52 kg/m    Physical Exam:  General- No distress,  A&Ox3, pleasant ENT: No sinus tenderness, TM clear, pale nasal mucosa, no oral exudate,no post nasal drip, no LAN Cardiac: S1, S2, regular rate and rhythm, no murmur Chest: No wheeze/ rales/ dullness; no accessory muscle use, no nasal flaring, no sternal retractions Abd.: Soft Non-tender, ND, BS +, Body mass index is 25.52 kg/m.  Ext: No  clubbing cyanosis, edema, no obvious deformities Neuro:  normal strength, MAE x 4, A&O x 3, appropriate Skin: No rashes, warm and dry, no obvious deformities Psych: normal mood and behavior   Assessment & Plan Left upper lobe non-small  cell lung cancer (adenocarcinoma) Malignant cells favoring poorly differentiated adenocarcinoma in the left upper lobe. PET scan shows mildly enlarged lymph nodes, but 4R lymph node biopsy was negative for malignancy.  Pulmonary function tests show mild obstruction and low diffusion capacity, possibly affecting surgical candidacy.  Discussed treatment options: surgery and SBRT. MRI of the brain is needed for complete staging .  Plan - Order MRI of the brain to complete staging. - Refer to thoracic surgery for evaluation of surgical candidacy. - Refer to radiation oncology for consideration of SBRT. - Consider referral to medical oncology for oversight and potential immune therapy if markers are present.  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  Breztri  inhaler is helping with Dyspnea Plan Discussed potential Zephyr valve for COPD management post-cancer treatment. - Continue Breztri  inhaler, two puffs in the morning and evening. - Rinse mouth after use, brush teeth and use mouthwash  - Prescribed albuterol  inhaler as a rescue medication. - Use 1-2 puffs as needed for shortness of breath or wheezing - Encourage smoking cessation and use of nicotine  patches. - Discuss potential Zephyr valve for COPD management post-cancer treatment.  Oral candidiasis (thrush) Oral thrush likely secondary to inhaler use without proper oral hygiene.  Emphasized importance of rinsing mouth and brushing teeth after using inhalers to prevent recurrence Plan. - Prescribe Mycelex  troches, three times a day for seven days. - Instruct to rinse mouth and brush teeth after using Breztri  inhaler.  Nicotine  dependence, current Current nicotine  dependence.  Smoking cessation is crucial for improving lung function and candidacy for cancer treatment.  Plan - Continue  using nicotine   patches. - Encourage continued use of nicotine  patches for smoking cessation. - Reinforce importance of quitting smoking for overall health and treatment efficacy.    I spent 40 minutes dedicated to the care of this patient on the date of this encounter to include pre-visit review of records, face-to-face time with the patient discussing conditions above, post visit ordering of testing, clinical documentation with the electronic health record, making appropriate referrals as documented, and communicating necessary information to the patient's healthcare team.    Lauraine JULIANNA Lites, NP 02/25/2024  11:29 AM

## 2024-02-25 NOTE — Patient Instructions (Addendum)
 It is good to see you today.  I am glad you have done well after the procedure. We reviewed your biopsy results. These show a non-small cell lung cancer in the left upper lobe. We have also reviewed the results of your PET scan, which shows the left suprahilar mass and also some enlarged lymph nodes. The lymph node biopsy that Dr. Shelah did was negative for malignant cells. I have referred you to thoracic surgery although we discussed that you are borderline as a surgical candidate. I have referred you to radiation oncology You should be getting phone calls to get both of these consults scheduled. I have placed an order for an MRI of the brain to complete staging. I will call MRI and check with them regarding the partial shoulder replacement. If we need to change this to a CT of the brain I will call you and let you know. I have sent in prescription for Breztri  2 puffs in the morning and 2 puffs in the evening, rinse mouth and use mouthwash after use I have also sent in albuterol  which is a rescue inhaler for breakthrough shortness of breath Use 1 to 2 breaths as needed for shortness of breath or wheezing. Remember albuterol  is your rescue to be used as needed and Breztri  is your maintenance to be used every day without fail. I have sent in a prescription for Mycelex  troches as you have what looks to be a thrush to your tongue and mouth. Use 1 three times a day for 7 days. Good luck with your treatment. You will get great care .  Call us  if you need us .  Please contact office for sooner follow up if symptoms do not improve or worsen or seek emergency care

## 2024-02-26 ENCOUNTER — Ambulatory Visit (INDEPENDENT_AMBULATORY_CARE_PROVIDER_SITE_OTHER): Payer: Self-pay

## 2024-02-26 ENCOUNTER — Encounter: Payer: Self-pay | Admitting: Acute Care

## 2024-02-26 NOTE — Telephone Encounter (Signed)
 LVM for patient to give [place] a call back.

## 2024-02-28 ENCOUNTER — Ambulatory Visit
Admission: RE | Admit: 2024-02-28 | Discharge: 2024-02-28 | Disposition: A | Discharge status: Home / Self Care | Source: Ambulatory Visit | Attending: Acute Care | Admitting: Acute Care

## 2024-02-28 ENCOUNTER — Other Ambulatory Visit: Payer: Self-pay

## 2024-02-28 DIAGNOSIS — C3490 Malignant neoplasm of unspecified part of unspecified bronchus or lung: Secondary | ICD-10-CM

## 2024-02-28 MED ORDER — GADOPICLENOL 0.5 MMOL/ML IV SOLN
9.0000 mL | Freq: Once | INTRAVENOUS | Status: AC | PRN
  Administered 2024-02-28: 9 mL via INTRAVENOUS

## 2024-02-28 NOTE — Progress Notes (Signed)
 The proposed treatment discussed in conference is for discussion purpose only and is not a binding recommendation.  The patients have not been physically examined, or presented with their treatment options.  Therefore, final treatment plans cannot be decided.

## 2024-03-04 ENCOUNTER — Ambulatory Visit
Admission: RE | Admit: 2024-03-04 | Discharge: 2024-03-04 | Disposition: A | Discharge status: Home / Self Care | Source: Ambulatory Visit | Attending: Radiation Oncology | Admitting: Radiation Oncology

## 2024-03-04 ENCOUNTER — Encounter: Payer: Self-pay | Admitting: Radiation Oncology

## 2024-03-04 VITALS — BP 135/82 | HR 54 | Temp 97.3°F | Resp 18 | Ht 72.0 in | Wt 187.0 lb

## 2024-03-04 DIAGNOSIS — E785 Hyperlipidemia, unspecified: Secondary | ICD-10-CM | POA: Diagnosis not present

## 2024-03-04 DIAGNOSIS — Z801 Family history of malignant neoplasm of trachea, bronchus and lung: Secondary | ICD-10-CM | POA: Insufficient documentation

## 2024-03-04 DIAGNOSIS — G4730 Sleep apnea, unspecified: Secondary | ICD-10-CM | POA: Diagnosis not present

## 2024-03-04 DIAGNOSIS — I10 Essential (primary) hypertension: Secondary | ICD-10-CM | POA: Diagnosis not present

## 2024-03-04 DIAGNOSIS — I2510 Atherosclerotic heart disease of native coronary artery without angina pectoris: Secondary | ICD-10-CM | POA: Insufficient documentation

## 2024-03-04 DIAGNOSIS — Z79899 Other long term (current) drug therapy: Secondary | ICD-10-CM | POA: Insufficient documentation

## 2024-03-04 DIAGNOSIS — R590 Localized enlarged lymph nodes: Secondary | ICD-10-CM | POA: Insufficient documentation

## 2024-03-04 DIAGNOSIS — F17210 Nicotine dependence, cigarettes, uncomplicated: Secondary | ICD-10-CM | POA: Diagnosis not present

## 2024-03-04 DIAGNOSIS — C3412 Malignant neoplasm of upper lobe, left bronchus or lung: Secondary | ICD-10-CM

## 2024-03-04 DIAGNOSIS — J432 Centrilobular emphysema: Secondary | ICD-10-CM | POA: Insufficient documentation

## 2024-03-04 DIAGNOSIS — Z7982 Long term (current) use of aspirin: Secondary | ICD-10-CM | POA: Insufficient documentation

## 2024-03-04 DIAGNOSIS — K219 Gastro-esophageal reflux disease without esophagitis: Secondary | ICD-10-CM | POA: Diagnosis not present

## 2024-03-04 NOTE — Progress Notes (Signed)
 Thoracic Location of Tumor / Histology: Left Upper Lobe  Patient presented for lung screening scan in July 2025 which showed interval development of a roughly 5.5 x 3.8 cm left suprahilar mass.   MRI Brain 02/28/2024:  Bronchoscopy 02/19/2024  PET 02/18/2024: Left suprahilar mass measuring 4.2 x 4.2 cm has intense metabolic activity.   Biopsies of LUL Lung/Lymph Node 02/19/2024     Past/Anticipated interventions by pulmonary, if any: Lauraine Lites NP 02/25/2024 - Refer to thoracic surgery for evaluation of surgical candidacy. - Refer to radiation oncology for consideration of SBRT   Past/Anticipated interventions by cardiothoracic surgery, if any:  Dr. Shyrl 03/14/2024   Past/Anticipated interventions by medical oncology, if any:    Tobacco/Marijuana/Snuff/ETOH use: Former smoker, quit recently.  Signs/Symptoms Weight changes, if any: None Respiratory complaints, if any: None  Hemoptysis, if any: He reports productive cough with clear phlegm.  Denies hemoptysis. Pain issues, if any: Denies chest pain, pressure, or tightness.    SAFETY ISSUES: Prior radiation? No Pacemaker/ICD? No  Possible current pregnancy? N/a Is the patient on methotrexate? No  Current Complaints / other details:

## 2024-03-04 NOTE — Progress Notes (Signed)
 Radiation Oncology         [phone]) [phone] ________________________________  Name: Charles Grimes        MRN: [id number]  Date of Service: 03/04/2024 DOB: [birth year]  RR:Tzwiopwh, Charles Mt, DO  Byrum, Charles RAMAN, MD     REFERRING PHYSICIAN: Shelah Charles RAMAN, MD   DIAGNOSIS: The encounter diagnosis was Malignant neoplasm of upper lobe of left lung (HCC).   HISTORY OF PRESENT ILLNESS: Charles Grimes is a 68 y.o. male seen at the request of Dr. Shelah for a new diagnosis of non-small cell lung cancer. He was evaluated in the lung cancer screening clinic and underwent a CT scan on 01/21/2024 which showed a 5.5 cm left suprahilar mass obliterating the apical posterior segmental bronchus of the left upper lobe, this obscured evaluation of the nodal station, and he underwent bronchoscopy on 02/19/2024.  Brushings and fine-needle aspirate biopsy of the left upper lobe mass were consistent with malignant cells, favoring poorly differentiated adenocarcinoma, and fine-needle aspirate of the 4R lymph node is negative for malignancy.  A PET scan on 02/18/2024 was read on 02/25/2024 and confirmed a 4.2 cm left upper lobe suprahilar mass with an SUV of 18.9, mild activity throughout the esophagus which favored to be inflammatory, and a subcarinal lymph nodes measuring 10 mm with mild SUV activity of 3.9 with mediastinal blood pool SUV not noted.  No evidence of metastatic disease was appreciated.  An MRI of the brain on 02/28/2024 was also performed but has not been read.  This case was discussed in multidisciplinary thoracic oncology conference, discussion was to consider further testing of the subcarinal lymph node station and he is scheduled to see Dr. Shyrl next Friday.  He has undergone PFTs and is seen to consider possible radiotherapy also as part of his treatment plan.      PREVIOUS RADIATION THERAPY: No   PAST MEDICAL HISTORY:  Past Medical History:  Diagnosis Date   Anginal pain (HCC)    Anxiety     Arthritis    Coronary artery disease, non-occlusive 08/2013   Mild to moderate (40% OM1) single vessel CAD. Otherwise nonobstructed.   Depression    Dyslipidemia, goal LDL below 100    Essential hypertension    Family history of premature CAD    GERD (gastroesophageal reflux disease)    H/O skin disorder    Involving hands. Subsequently resolved.    Nonischemic cardiomyopathy (HCC) 05/2013   Non-ischemic: EF ~45% by Echo & Myoview  --> Non-obstructive CAD   Obesity (BMI 30.0-34.9)    OSA (obstructive sleep apnea) 06/21/2017   uses CPAP   Sleep apnea        PAST SURGICAL HISTORY: Past Surgical History:  Procedure Laterality Date   COLONOSCOPY  2015   COLONOSCOPY WITH PROPOFOL   01/02/2017   Dr.Pyrtle   LEFT HEART CATHETERIZATION WITH CORONARY ANGIOGRAM  08/26/2013   Mild to moderate disease with 40-50% stenosis and OM1. Otherwise no significant CAD --  Surgeon: Alm LELON Clay, MD;  Location: [facility] CATH LAB;  Service: Cardiovascular;;   Lower extremity arterial Dopplers  06/11/2013   No occlusive disease   LUMBAR LAMINECTOMY/DECOMPRESSION MICRODISCECTOMY Right 10/07/2014   Procedure: LUMBAR LAMINECTOMY/DECOMPRESSION MICRODISCECTOMY 1 LEVEL;  Surgeon: Arley Helling, MD;  Location: [facility] NEURO ORS;  Service: Neurosurgery;  Laterality: Right;  Right L3-L4 Microdiscectomy   NM MYOVIEW  LTD  06/03/2013   Low risk study with no ischemia. EF roughly 44% with no regional wall motion abnormalities noted   PFTs  06/16/2013   Normal Volumes &  Spirometry; Moderately reduced DLCO   POLYPECTOMY     SHOULDER HEMI-ARTHROPLASTY Right 05/28/2015   Procedure: RIGHT SHOULDER HEMI-ARTHROPLASTY CTA HEAD AND SUBSCAP REPAIR ;  Surgeon: Marcey Her, MD;  Location: [facility] OR;  Service: Orthopedics;  Laterality: Right;   TRANSTHORACIC ECHOCARDIOGRAM  06/11/2013   Mildly reduced EF: 45-50%. Mild anterior hypokinesis with incoordinate septal motion. No evidence of pulmonary hypertension   VIDEO BRONCHOSCOPY WITH  ENDOBRONCHIAL ULTRASOUND Left 02/19/2024   Procedure: BRONCHOSCOPY, WITH EBUS;  Surgeon: Shelah Charles RAMAN, MD;  Location: [facility] ENDOSCOPY;  Service: Pulmonary;  Laterality: Left;     FAMILY HISTORY:  Family History  Problem Relation Age of Onset   Atrial fibrillation Mother        Alive at 57   COPD Mother    Hypertension Mother    Colonic polyp Mother    Hypertension Father        Alive at 51   Lung cancer Father        Chronic smoker   Factor V Leiden deficiency Father        Also Lupus anticoagulant   Heart attack Maternal Grandmother 48   Heart failure Paternal Grandmother    Diabetes Paternal Grandmother    Heart attack Brother 59       X2   Heart attack Sister 13       Deceased   Healthy Sister        x2   Healthy Son        x2   Colon cancer Neg Hx    Esophageal cancer Neg Hx    Rectal cancer Neg Hx    Stomach cancer Neg Hx      SOCIAL HISTORY:  reports that he has been smoking cigarettes. He has a 42 pack-year smoking history. He has never used smokeless tobacco. He reports that he does not currently use alcohol. He reports that he does not use drugs. The patient is married and accompanied by his wife and adult sons.  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   ALLERGIES: Ibuprofen, Isosorbide  nitrate, Oxycodone , and Penicillins   MEDICATIONS:  Current Outpatient Medications  Medication Sig Dispense Refill   albuterol  (VENTOLIN  HFA) 108 (90 Base) MCG/ACT inhaler Inhale 2 puffs into the lungs every 6 (six) hours as needed for wheezing or shortness of breath. 8 g 2   ALPRAZolam  (XANAX ) 0.5 MG tablet Take 1 tablet (0.5 mg total) by mouth at bedtime as needed for anxiety. 30 tablet 0   aspirin  EC 81 MG tablet Take 81 mg by mouth daily.     atorvastatin  (LIPITOR) 10 MG tablet Take 1 tablet (10 mg total) by mouth daily. 90 tablet 3   budesonide-glycopyrrolate -formoterol (BREZTRI  AEROSPHERE) 160-9-4.8 MCG/ACT AERO inhaler Inhale 2  puffs into the lungs in the morning and at bedtime. 10.7 each 6   budesonide-glycopyrrolate -formoterol (BREZTRI  AEROSPHERE) 160-9-4.8 MCG/ACT AERO inhaler Inhale 2 puffs into the lungs in the morning and at bedtime.     clotrimazole  (MYCELEX ) 10 MG troche Take 1 tablet (10 mg total) by mouth 3 (three) times daily. 21 Troche 0   lisinopril  (ZESTRIL ) 10 MG tablet Take 1 tablet (10 mg total) by mouth daily. 90 tablet 3   Multiple Vitamins-Minerals (CENTRUM SILVER 50+MEN) TABS Take 1 tablet by mouth daily.     nitroGLYCERIN  (NITROSTAT ) 0.4 MG SL tablet Place 1 tablet (0.4 mg total) under the tongue every 5 (five)  minutes as needed for chest pain. 30 tablet 1   NON FORMULARY Pt uses a c-pap nightly     omeprazole  (PRILOSEC ) 20 MG capsule Take 1 capsule (20 mg total) by mouth daily. 90 capsule 3   sertraline  (ZOLOFT ) 50 MG tablet Take 2 tabs in the morning and 1 tab in the evening. 270 tablet 3   tiZANidine  (ZANAFLEX ) 4 MG tablet Take 1 tablet (4 mg total) by mouth at bedtime as needed for muscle spasms. 30 tablet 2   triamcinolone  cream (KENALOG ) 0.1 % Apply 1 Application topically 2 (two) times daily as needed (itch). 30 g 1   colchicine  0.6 MG tablet Take 1 tablet (0.6 mg total) by mouth daily as needed (gout flares). (Patient not taking: Reported on 03/04/2024)     No current facility-administered medications for this encounter.     REVIEW OF SYSTEMS: On review of systems, the patient reports that he is active and enjoys working in his workshop at home being active in his yard.  He states he was not doing routine exercise, but with some exertion such as push mowing his yard he can become short winded but recovers and is able to complete the task.  He denies shortness of breath at rest or chest pain.  No unintended weight changes are reported.  He notes a intermittent productive cough with clear phlegm.  No other complaints are verbalized       PHYSICAL EXAM:  Wt Readings from Last 3 Encounters:   03/04/24 187 lb (84.8 kg)  02/25/24 188 lb 3.2 oz (85.4 kg)  02/19/24 188 lb (85.3 kg)   Temp Readings from Last 3 Encounters:  03/04/24 (!) 97.3 F (36.3 C) (Temporal)  02/25/24 97.7 F (36.5 C) (Oral)  02/19/24 97.6 F (36.4 C) (Temporal)   BP Readings from Last 3 Encounters:  03/04/24 135/82  02/25/24 133/83  02/22/24 (!) 149/78   Pulse Readings from Last 3 Encounters:  03/04/24 (!) 54  02/25/24 64  02/22/24 (!) 56   Pain Assessment Pain Score: 0-No pain/10  In general this is a well appearing caucasian male in no acute distress. He's alert and oriented x4 and appropriate throughout the examination. Cardiopulmonary assessment is negative for acute distress and he exhibits normal effort.     ECOG = 1  0 - Asymptomatic (Fully active, able to carry on all predisease activities without restriction)  1 - Symptomatic but completely ambulatory (Restricted in physically strenuous activity but ambulatory and able to carry out work of a light or sedentary nature. For example, light housework, office work)  2 - Symptomatic, <50% in bed during the day (Ambulatory and capable of all self care but unable to carry out any work activities. Up and about more than 50% of waking hours)  3 - Symptomatic, >50% in bed, but not bedbound (Capable of only limited self-care, confined to bed or chair 50% or more of waking hours)  4 - Bedbound (Completely disabled. Cannot carry on any self-care. Totally confined to bed or chair)  5 - Death   Raylene MM, Creech RH, Tormey DC, et al. [phone]). Toxicity and response criteria of the [facility]. Am. DOROTHA Bridges. Oncol. 5 (6): 649-55    LABORATORY DATA:  Lab Results  Component Value Date   WBC 9.9 02/19/2024   HGB 14.1 02/19/2024   HCT 45.6 02/19/2024   MCV 91.9 02/19/2024   PLT 157 02/19/2024   Lab Results  Component Value Date   NA 139  02/19/2024   K 4.5 02/19/2024   CL 107 02/19/2024   CO2 21 (L) 02/19/2024   Lab  Results  Component Value Date   ALT 11 01/14/2024   AST 16 01/14/2024   ALKPHOS 159 (H) 01/14/2024   BILITOT 0.6 01/14/2024      RADIOGRAPHY: NM PET Image Initial (PI) Skull Base To Thigh Result Date: 02/25/2024 CLINICAL DATA:  Initial treatment strategy for pulmonary nodule. EXAM: NUCLEAR MEDICINE PET SKULL BASE TO THIGH TECHNIQUE: 9.5 mCi F-18 FDG was injected intravenously. Full-ring PET imaging was performed from the skull base to thigh after the radiotracer. CT data was obtained and used for attenuation correction and anatomic localization. Fasting blood glucose: 84 mg/dl COMPARISON:  None Available. FINDINGS: NECK: No hypermetabolic lymph nodes in the neck. Incidental CT findings: None. CHEST: LEFT suprahilar mass measuring 4.2 x 4.2 cm has intense metabolic activity with SUV max equal 18.9. Mild activity throughout the esophagus is favored inflammatory. Subcarinal lymph node is upper limits of normal in size measuring 10 mm short axis and with mild metabolic activity SUV max equal 3.9 (image 79). Incidental CT findings: Upper lobe centrilobular emphysema. Interstitial thickening and cystic change at the lung bases. ABDOMEN/PELVIS: No abnormal hypermetabolic activity within the liver, pancreas, adrenal glands, or spleen. No hypermetabolic lymph nodes in the abdomen or pelvis. Incidental CT findings: None. SKELETON: No focal hypermetabolic activity to suggest skeletal metastasis. Incidental CT findings: None. IMPRESSION: 1. Hypermetabolic LEFT suprahilar mass consistent with primary bronchogenic carcinoma. 2. Mildly enlarged subcarinal lymph node with mild metabolic activity is indeterminate for metastatic adenopathy. 3. No evidence of distant metastatic disease. 4. Mild metabolic activity throughout the esophagus is favored inflammatory. 5.  Emphysema (ICD10-J43.9). Electronically Signed   By: Jackquline Boxer M.D.   On: 02/25/2024 10:54   DG C-Arm 1-60 Min-No Report Result Date:  02/19/2024 Fluoroscopy was utilized by the requesting physician.  No radiographic interpretation.       IMPRESSION/PLAN: 1. At least Stage IIA-IIIA, cT2bN0-2,Mx, NSCLC, favor adenocarcinoma of the LUL. Dr. Dewey discusses the pathology findings and reviews the nature of localized versus locally advanced versus metastatic disease. The staging of his cancer is not finalized based on the question of his lymph node status at station 7 in the subcarinal region. His case was discussed in [facility] conference and he will meet Dr. Shyrl next week.  We discussed the possibility of additional lymph node sampling versus if Dr. Shyrl thinks he is a surgical candidate for lobectomy.  We discussed the possibilities of each scenario and also the potential outcomes of chemoradiation if his lymph node is positive versus ultra hypofractionated radiation as an alternative to lobectomy if his node was negative.  We discussed the risks, benefits, short, and long term effects of these different modalities radiotherapy, as well as the goal for curative intent, provide if his MRI of the brain is negative.   Dr. Dewey discusses the delivery and logistics of each form of radiation, and if ultra hypofractionated treatment was being given just to the lung, that could be a course of 10-15 fractions versus if the node was positive a course of 6-1/2 weeks with chemoradiation.  The patient is also aware of the role of simulation if radiation was offered and, and I will contact him after his visit with Dr. Shyrl to be revisit what our role for treatment of this patient may or may not be.  He is also muscular flexible treatment.   In a visit lasting 60 minutes, greater  than 50% of the time was spent face to face discussing the patient's condition, in preparation for the discussion, and coordinating the patient's care.   The above documentation reflects my direct findings during this shared patient visit. Please see the separate note  by Dr. Dewey on this date for the remainder of the patient's plan of care.    Shivan KYM Husband, [facility]   **Disclaimer: This note was dictated with voice recognition software. Similar sounding words can inadvertently be transcribed and this note may contain transcription errors which may not have been corrected upon publication of note.**

## 2024-03-06 ENCOUNTER — Ambulatory Visit: Payer: Self-pay

## 2024-03-06 NOTE — Telephone Encounter (Signed)
 Called and spoke with patient son dell,verbalized understanding.NFN

## 2024-03-06 NOTE — Telephone Encounter (Signed)
-----   Message from Lauraine JULIANNA Lites sent at 03/06/2024 11:26 AM EDT ----- Please call patient and let him know his MRI brain was negative for metastatic disease. This is great news. Dr. Shyrl will review with him when he sees him 03/14/2024. Thanks so much ----- Message ----- From: Interface, Rad Results In Sent: 03/06/2024   8:57 AM EDT To: Lauraine JULIANNA Lites, NP

## 2024-03-12 NOTE — Progress Notes (Unsigned)
 [address]       [place] [address]             [phone]                    SANFORD LINDBLAD [facility] Medical Record #[id number] Date of Birth: September 06, 1955  Referring: Ruthell Lauraine FALCON, NP Primary Care: [name], DO Primary Cardiologist: None  Chief Complaint:   No chief complaint on file.   History of Present Illness:    Charles Grimes is a 68 y.o. male who presents for surgical evaluation of a left hilar NSCLC.  He is a current smoker.      Smoking Hx: Current smoker  Zubrod Score: At the time of surgery this patient's most appropriate activity status/level should be described as: [x]     0    Normal activity, no symptoms []     1    Restricted in physical strenuous activity but ambulatory, able to do out light work []     2    Ambulatory and capable of self care, unable to do work activities, up and about               >50 % of waking hours                              []     3    Only limited self care, in bed greater than 50% of waking hours []     4    Completely disabled, no self care, confined to bed or chair []     5    Moribund     Past Medical History:  Diagnosis Date   Anginal pain (HCC)    Anxiety    Arthritis    Coronary artery disease, non-occlusive 08/2013   Mild to moderate (40% OM1) single vessel CAD. Otherwise nonobstructed.   Depression    Dyslipidemia, goal LDL below 100    Essential hypertension    Family history of premature CAD    GERD (gastroesophageal reflux disease)    H/O skin disorder    Involving hands. Subsequently resolved.    Nonischemic cardiomyopathy (HCC) 05/2013   Non-ischemic: EF ~45% by Echo & Myoview  --> Non-obstructive CAD   Obesity (BMI 30.0-34.9)    OSA (obstructive sleep apnea) 06/21/2017   uses CPAP   Sleep apnea     Past Surgical History:  Procedure Laterality Date   COLONOSCOPY  2015   COLONOSCOPY WITH PROPOFOL   01/02/2017   Dr.Pyrtle   LEFT HEART CATHETERIZATION WITH  CORONARY ANGIOGRAM  08/26/2013   Mild to moderate disease with 40-50% stenosis and OM1. Otherwise no significant CAD --  Surgeon: Alm LELON Clay, MD;  Location: [facility] CATH LAB;  Service: Cardiovascular;;   Lower extremity arterial Dopplers  06/11/2013   No occlusive disease   LUMBAR LAMINECTOMY/DECOMPRESSION MICRODISCECTOMY Right 10/07/2014   Procedure: LUMBAR LAMINECTOMY/DECOMPRESSION MICRODISCECTOMY 1 LEVEL;  Surgeon: Arley Helling, MD;  Location: [facility] NEURO ORS;  Service: Neurosurgery;  Laterality: Right;  Right L3-L4 Microdiscectomy   NM MYOVIEW  LTD  06/03/2013   Low risk study with no ischemia. EF roughly 44% with no regional wall motion abnormalities noted   PFTs  06/16/2013   Normal Volumes &  Spirometry; Moderately reduced DLCO   POLYPECTOMY     SHOULDER HEMI-ARTHROPLASTY Right 05/28/2015   Procedure: RIGHT SHOULDER HEMI-ARTHROPLASTY CTA HEAD AND SUBSCAP REPAIR ;  Surgeon: Marcey Her, MD;  Location: [facility] OR;  Service: Orthopedics;  Laterality: Right;   TRANSTHORACIC ECHOCARDIOGRAM  06/11/2013   Mildly reduced EF: 45-50%. Mild anterior hypokinesis with incoordinate septal motion. No evidence of pulmonary hypertension   VIDEO BRONCHOSCOPY WITH ENDOBRONCHIAL ULTRASOUND Left 02/19/2024   Procedure: BRONCHOSCOPY, WITH EBUS;  Surgeon: Shelah Lamar RAMAN, MD;  Location[facility] ENDOSCOPY;  Service: Pulmonary;  Laterality: Left;    Family History  Problem Relation Age of Onset   Atrial fibrillation Mother        Alive at 10   COPD Mother    Hypertension Mother    Colonic polyp Mother    Hypertension Father        Alive at 38   Lung cancer Father        Chronic smoker   Factor V Leiden deficiency Father        Also Lupus anticoagulant   Heart attack Maternal Grandmother 48   Heart failure Paternal Grandmother    Diabetes Paternal Grandmother    Heart attack Brother 12       X2   Heart attack Sister 77       Deceased   Healthy Sister        x2   Healthy Son        x2   Colon cancer Neg Hx     Esophageal cancer Neg Hx    Rectal cancer Neg Hx    Stomach cancer Neg Hx      Social History   Tobacco Use  Smoking Status Every Day   Current packs/day: 1.00   Average packs/day: 1 pack/day for 42.0 years (42.0 ttl pk-yrs)   Types: Cigarettes  Smokeless Tobacco Never  Tobacco Comments   3 cigarettes a day 02/12/2024 KRD    Social History   Substance and Sexual Activity  Alcohol Use Not Currently   Comment: rarely     Allergies  Allergen Reactions   Ibuprofen     GI Upset   Isosorbide  Nitrate Other (See Comments)    CONTINUOUS HEADACHE    Oxycodone  Itching    Reaction was to straight oxycodone  15 mg tablets - no reaction to percocet   Penicillins Hives and Rash    Has patient had a PCN reaction causing immediate rash, facial/tongue/throat swelling, SOB or lightheadedness with hypotension: Yes Has patient had a PCN reaction causing severe rash involving mucus membranes or skin necrosis: No Has patient had a PCN reaction that required hospitalization No Has patient had a PCN reaction occurring within the last 10 years: No If all of the above answers are NO, then may proceed with Cephalosporin use.    Current Outpatient Medications  Medication Sig Dispense Refill   albuterol  (VENTOLIN  HFA) 108 (90 Base) MCG/ACT inhaler Inhale 2 puffs into the lungs every 6 (six) hours as needed for wheezing or shortness of breath. 8 g 2   ALPRAZolam  (XANAX ) 0.5 MG tablet Take 1 tablet (0.5 mg total) by mouth at bedtime as needed for anxiety. 30 tablet 0   aspirin  EC 81 MG tablet Take 81 mg by mouth daily.     atorvastatin  (LIPITOR) 10 MG tablet Take 1 tablet (10 mg total) by mouth daily. 90 tablet 3   budesonide-glycopyrrolate -formoterol (BREZTRI  AEROSPHERE) 160-9-4.8 MCG/ACT AERO inhaler Inhale 2 puffs into the lungs in the morning and at bedtime. 10.7 each 6   budesonide-glycopyrrolate -formoterol (BREZTRI  AEROSPHERE) 160-9-4.8 MCG/ACT AERO inhaler Inhale 2 puffs into the lungs in the  morning and at  bedtime.     clotrimazole  (MYCELEX ) 10 MG troche Take 1 tablet (10 mg total) by mouth 3 (three) times daily. 21 Troche 0   colchicine  0.6 MG tablet Take 1 tablet (0.6 mg total) by mouth daily as needed (gout flares). (Patient not taking: Reported on 03/04/2024)     lisinopril  (ZESTRIL ) 10 MG tablet Take 1 tablet (10 mg total) by mouth daily. 90 tablet 3   Multiple Vitamins-Minerals (CENTRUM SILVER 50+MEN) TABS Take 1 tablet by mouth daily.     nitroGLYCERIN  (NITROSTAT ) 0.4 MG SL tablet Place 1 tablet (0.4 mg total) under the tongue every 5 (five) minutes as needed for chest pain. 30 tablet 1   NON FORMULARY Pt uses a c-pap nightly     omeprazole  (PRILOSEC ) 20 MG capsule Take 1 capsule (20 mg total) by mouth daily. 90 capsule 3   sertraline  (ZOLOFT ) 50 MG tablet Take 2 tabs in the morning and 1 tab in the evening. 270 tablet 3   tiZANidine  (ZANAFLEX ) 4 MG tablet Take 1 tablet (4 mg total) by mouth at bedtime as needed for muscle spasms. 30 tablet 2   triamcinolone  cream (KENALOG ) 0.1 % Apply 1 Application topically 2 (two) times daily as needed (itch). 30 g 1   No current facility-administered medications for this visit.    Review of Systems  Constitutional:  Negative for malaise/fatigue and weight loss.  Respiratory:  Negative for cough and shortness of breath.   Cardiovascular:  Negative for chest pain.  Neurological: Negative.      PHYSICAL EXAMINATION: There were no vitals taken for this visit. Physical Exam Constitutional:      General: He is not in acute distress.    Appearance: He is not ill-appearing.  HENT:     Head: Normocephalic.  Eyes:     Extraocular Movements: Extraocular movements intact.  Cardiovascular:     Rate and Rhythm: Normal rate.  Pulmonary:     Effort: Pulmonary effort is normal. No respiratory distress.  Abdominal:     General: Abdomen is flat. There is no distension.  Musculoskeletal:        General: Normal range of motion.     Cervical  back: Normal range of motion.  Skin:    General: Skin is warm and dry.  Neurological:     General: No focal deficit present.     Mental Status: He is alert and oriented to person, place, and time.          I have independently reviewed the above radiology studies  and reviewed the findings with the patient.   Recent Lab Findings: Lab Results  Component Value Date   WBC 9.9 02/19/2024   HGB 14.1 02/19/2024   HCT 45.6 02/19/2024   PLT 157 02/19/2024   GLUCOSE 81 02/19/2024   CHOL 166 01/14/2024   TRIG 126.0 01/14/2024   HDL 33.50 (L) 01/14/2024   LDLDIRECT 104 (H) 06/10/2014   LDLCALC 107 (H) 01/14/2024   ALT 11 01/14/2024   AST 16 01/14/2024   NA 139 02/19/2024   K 4.5 02/19/2024   CL 107 02/19/2024   CREATININE 0.99 02/19/2024   BUN 19 02/19/2024   CO2 21 (L) 02/19/2024   TSH 1.76 06/10/2014   INR 0.97 04/30/2015   HGBA1C 5.9 10/25/2015    Diagnostic Studies & Laboratory data:     Recent Radiology Findings:   MR BRAIN W WO CONTRAST Result Date: 03/06/2024 CLINICAL DATA:  Provided history: Malignant neoplasm of unspecified part of unspecified  bronchus or lung. Non-small cell lung cancer, staging. EXAM: MRI HEAD WITHOUT AND WITH CONTRAST TECHNIQUE: Multiplanar, multiecho pulse sequences of the brain and surrounding structures were obtained without and with intravenous contrast. CONTRAST:  9 mL Vueway  intravenous contrast. COMPARISON:  None. FINDINGS: Brain: No age-advanced or lobar predominant cerebral atrophy. Multifocal T2 FLAIR hyperintense signal abnormality within the cerebral white matter, nonspecific but compatible with mild chronic small vessel ischemic disease. 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 There is no acute infarct. No evidence of an intracranial mass. No chronic intracranial blood products. No extra-axial fluid collection. No midline shift. No pathologic intracranial enhancement identified. Vascular: Maintained  flow voids within the proximal large arterial vessels. Skull and upper cervical spine: No focal worrisome marrow lesion. Sinuses/Orbits: No mass or acute finding within the imaged orbits. Trace mucosal thickening within the bilateral ethmoid and left maxillary sinuses. IMPRESSION: 1. No evidence of intracranial metastatic disease. 2. Mild chronic small vessel ischemic changes within the cerebral white matter. 3. 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 Electronically Signed   By: Rockey Childs D.O.   On: 03/06/2024 08:55   NM PET Image Initial (PI) Skull Base To Thigh Result Date: 02/25/2024 CLINICAL DATA:  Initial treatment strategy for pulmonary nodule. EXAM: NUCLEAR MEDICINE PET SKULL BASE TO THIGH TECHNIQUE: 9.5 mCi F-18 FDG was injected intravenously. Full-ring PET imaging was performed from the skull base to thigh after the radiotracer. CT data was obtained and used for attenuation correction and anatomic localization. Fasting blood glucose: 84 mg/dl COMPARISON:  None Available. FINDINGS: NECK: No hypermetabolic lymph nodes in the neck. Incidental CT findings: None. CHEST: LEFT suprahilar mass measuring 4.2 x 4.2 cm has intense metabolic activity with SUV max equal 18.9. Mild activity throughout the esophagus is favored inflammatory. Subcarinal lymph node is upper limits of normal in size measuring 10 mm short axis and with mild metabolic activity SUV max equal 3.9 (image 79). Incidental CT findings: Upper lobe centrilobular emphysema. Interstitial thickening and cystic change at the lung bases. ABDOMEN/PELVIS: No abnormal hypermetabolic activity within the liver, pancreas, adrenal glands, or spleen. No hypermetabolic lymph nodes in the abdomen or pelvis. Incidental CT findings: None. SKELETON: No focal hypermetabolic activity to suggest skeletal metastasis. Incidental CT findings: None. IMPRESSION: 1. Hypermetabolic LEFT suprahilar mass consistent with primary bronchogenic carcinoma. 2.  Mildly enlarged subcarinal lymph node with mild metabolic activity is indeterminate for metastatic adenopathy. 3. No evidence of distant metastatic disease. 4. Mild metabolic activity throughout the esophagus is favored inflammatory. 5.  Emphysema (ICD10-J43.9). Electronically Signed   By: Jackquline Boxer M.D.   On: 02/25/2024 10:54   DG C-Arm 1-60 Min-No Report Result Date: 02/19/2024 Fluoroscopy was utilized by the requesting physician.  No radiographic interpretation.     PFTs:  - FVC: 105% - FEV1: 94% -DLCO: 44%   FINAL MICROSCOPIC DIAGNOSIS:  A. LUNG, LUL MASS, BRUSHING:  - Malignant cells present.   B. LUNG, LUL MASS, FINE NEEDLE ASPIRATION  BIOPSY:  - Malignant cells present, favor poorly differentiated adenocarcinoma.        Assessment / Plan:   68 y.o. male with left hilar NSCLC that abuts the main PA.  This would require a pneumonectomy.  Additonally, he has marginal lung function with a DLCO of 44%.  I do not recommend surgery.      I  spent 40 minutes with  the patient face to face in counseling and coordination of care.    Linnie  O Merit Gadsby 03/12/2024 4:20 PM

## 2024-03-14 ENCOUNTER — Encounter: Payer: Self-pay | Admitting: Thoracic Surgery (Cardiothoracic Vascular Surgery)

## 2024-03-14 ENCOUNTER — Ambulatory Visit
Attending: Thoracic Surgery (Cardiothoracic Vascular Surgery) | Admitting: Thoracic Surgery (Cardiothoracic Vascular Surgery)

## 2024-03-14 VITALS — BP 128/78 | HR 68 | Resp 20 | Ht 72.0 in | Wt 189.9 lb

## 2024-03-14 DIAGNOSIS — R918 Other nonspecific abnormal finding of lung field: Secondary | ICD-10-CM

## 2024-03-25 ENCOUNTER — Other Ambulatory Visit: Payer: Self-pay

## 2024-03-25 DIAGNOSIS — R918 Other nonspecific abnormal finding of lung field: Secondary | ICD-10-CM

## 2024-03-26 ENCOUNTER — Inpatient Hospital Stay

## 2024-03-26 ENCOUNTER — Inpatient Hospital Stay: Attending: Internal Medicine | Admitting: Internal Medicine

## 2024-03-26 VITALS — BP 144/86 | HR 60 | Temp 97.3°F | Resp 17 | Wt 191.5 lb

## 2024-03-26 DIAGNOSIS — Z5111 Encounter for antineoplastic chemotherapy: Secondary | ICD-10-CM | POA: Insufficient documentation

## 2024-03-26 DIAGNOSIS — Z7982 Long term (current) use of aspirin: Secondary | ICD-10-CM | POA: Diagnosis not present

## 2024-03-26 DIAGNOSIS — I10 Essential (primary) hypertension: Secondary | ICD-10-CM | POA: Insufficient documentation

## 2024-03-26 DIAGNOSIS — I2510 Atherosclerotic heart disease of native coronary artery without angina pectoris: Secondary | ICD-10-CM | POA: Diagnosis not present

## 2024-03-26 DIAGNOSIS — Z801 Family history of malignant neoplasm of trachea, bronchus and lung: Secondary | ICD-10-CM | POA: Insufficient documentation

## 2024-03-26 DIAGNOSIS — F418 Other specified anxiety disorders: Secondary | ICD-10-CM | POA: Insufficient documentation

## 2024-03-26 DIAGNOSIS — G4733 Obstructive sleep apnea (adult) (pediatric): Secondary | ICD-10-CM | POA: Insufficient documentation

## 2024-03-26 DIAGNOSIS — Z510 Encounter for antineoplastic radiation therapy: Secondary | ICD-10-CM | POA: Insufficient documentation

## 2024-03-26 DIAGNOSIS — C3412 Malignant neoplasm of upper lobe, left bronchus or lung: Secondary | ICD-10-CM | POA: Diagnosis not present

## 2024-03-26 DIAGNOSIS — K5900 Constipation, unspecified: Secondary | ICD-10-CM | POA: Insufficient documentation

## 2024-03-26 DIAGNOSIS — Z79899 Other long term (current) drug therapy: Secondary | ICD-10-CM | POA: Diagnosis not present

## 2024-03-26 DIAGNOSIS — Z87891 Personal history of nicotine dependence: Secondary | ICD-10-CM | POA: Insufficient documentation

## 2024-03-26 DIAGNOSIS — R918 Other nonspecific abnormal finding of lung field: Secondary | ICD-10-CM

## 2024-03-26 LAB — CBC WITH DIFFERENTIAL (CANCER CENTER ONLY)
Abs Immature Granulocytes: 0.03 K/uL (ref 0.00–0.07)
Basophils Absolute: 0.1 K/uL (ref 0.0–0.1)
Basophils Relative: 1 %
Eosinophils Absolute: 0.1 K/uL (ref 0.0–0.5)
Eosinophils Relative: 1 %
HCT: 42.2 % (ref 39.0–52.0)
Hemoglobin: 13.6 g/dL (ref 13.0–17.0)
Immature Granulocytes: 0 %
Lymphocytes Relative: 23 %
Lymphs Abs: 2.1 K/uL (ref 0.7–4.0)
MCH: 28.7 pg (ref 26.0–34.0)
MCHC: 32.2 g/dL (ref 30.0–36.0)
MCV: 89 fL (ref 80.0–100.0)
Monocytes Absolute: 0.6 K/uL (ref 0.1–1.0)
Monocytes Relative: 6 %
Neutro Abs: 6.4 K/uL (ref 1.7–7.7)
Neutrophils Relative %: 69 %
Platelet Count: 166 K/uL (ref 150–400)
RBC: 4.74 MIL/uL (ref 4.22–5.81)
RDW: 14.2 % (ref 11.5–15.5)
WBC Count: 9.2 K/uL (ref 4.0–10.5)
nRBC: 0 % (ref 0.0–0.2)

## 2024-03-26 LAB — CMP (CANCER CENTER ONLY)
ALT: 14 U/L (ref 0–44)
AST: 18 U/L (ref 15–41)
Albumin: 3.7 g/dL (ref 3.5–5.0)
Alkaline Phosphatase: 115 U/L (ref 38–126)
Anion gap: 4 — ABNORMAL LOW (ref 5–15)
BUN: 25 mg/dL — ABNORMAL HIGH (ref 8–23)
CO2: 27 mmol/L (ref 22–32)
Calcium: 8.7 mg/dL — ABNORMAL LOW (ref 8.9–10.3)
Chloride: 111 mmol/L (ref 98–111)
Creatinine: 1.06 mg/dL (ref 0.61–1.24)
GFR, Estimated: >60 mL/min (ref >60)
Glucose, Bld: 80 mg/dL (ref 70–99)
Potassium: 4.7 mmol/L (ref 3.5–5.1)
Sodium: 142 mmol/L (ref 135–145)
Total Bilirubin: 0.4 mg/dL (ref 0.0–1.2)
Total Protein: 7.3 g/dL (ref 6.5–8.1)

## 2024-03-26 MED ORDER — LIDOCAINE-PRILOCAINE 2.5-2.5 % EX CREA: TOPICAL_CREAM | CUTANEOUS | 3 refills | Status: DC

## 2024-03-26 MED ORDER — PROCHLORPERAZINE MALEATE 10 MG PO TABS: 10.0000 mg | ORAL_TABLET | Freq: Four times a day (QID) | ORAL | 1 refills | Status: DC | PRN

## 2024-03-26 MED ORDER — ONDANSETRON HCL 8 MG PO TABS: 8.0000 mg | ORAL_TABLET | Freq: Three times a day (TID) | ORAL | 1 refills | Status: DC | PRN

## 2024-03-26 NOTE — Progress Notes (Signed)
  [facility] [phone]   [phone]  CONSULT NOTE  REFERRING PHYSICIAN: Dr. Lamar Chris  REASON FOR CONSULTATION:  68 years old white male recently diagnosed with lung cancer  HPI Charles Grimes is a 68 y.o. male.    HPI  Discussed the use of AI scribe software for clinical note transcription with the patient, who gave verbal consent to proceed.  History of Present Illness Charles Grimes is a 68 year old male with recently diagnosed lung cancer who presents for evaluation. He is accompanied by his girlfriend, Rock, and his son, Cheryl. He was referred by his primary doctor after reporting chest pain and breathing difficulties.  He initially presented with chest pain and breathing difficulties, prompting a chest X-ray and subsequent CT scan on January 21, 2024, which revealed a 5.5 by 3.8 cm mass in the left suprahilar area. A PET scan on February 18, 2024, was performed after the CT scan and showed a mass in the left suprahilar area and a mildly enlarged lymph node in the subcarinal area. A bronchoscopy on February 19, 2024, confirmed adenocarcinoma in the left upper lobe mass, though the lymph node biopsy was negative for cancer. An MRI of the brain conducted almost a month ago was negative.  He feels 'pretty good' today with no current chest pain or breathing issues, although he experienced hemoptysis this morning. No recent weight loss, nausea, vomiting, headaches, or changes in vision.  His past medical history includes high blood pressure, high cholesterol, coronary heart disease, depression and anxiety, arthritis, cardiomyopathy, sleep apnea, and acid reflux. He has no history of diabetes or stroke.  His father had lung cancer and Factor V Leiden, while his mother had AFib, COPD, and high blood pressure. He has no siblings with cancer.  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  He has a history of smoking for 52 years, having quit  approximately a month ago, and does not consume alcohol or use drugs. He is allergic to penicillin, oxycodone , isosorbide , and ibuprofen.     Past Medical History:  Diagnosis Date   Anginal pain (HCC)    Anxiety    Arthritis    Coronary artery disease, non-occlusive 08/2013   Mild to moderate (40% OM1) single vessel CAD. Otherwise nonobstructed.   Depression    Dyslipidemia, goal LDL below 100    Essential hypertension    Family history of premature CAD    GERD (gastroesophageal reflux disease)    H/O skin disorder    Involving hands. Subsequently resolved.    Nonischemic cardiomyopathy (HCC) 05/2013   Non-ischemic: EF ~45% by Echo & Myoview  --> Non-obstructive CAD   Obesity (BMI 30.0-34.9)    OSA (obstructive sleep apnea) 06/21/2017   uses CPAP   Sleep apnea       Past Surgical History:  Procedure Laterality Date   COLONOSCOPY  2015   COLONOSCOPY WITH PROPOFOL   01/02/2017   Dr.Pyrtle   LEFT HEART CATHETERIZATION WITH CORONARY ANGIOGRAM  08/26/2013   Mild to moderate disease with 40-50% stenosis and OM1. Otherwise no significant CAD --  Surgeon: Alm LELON Clay, MD;  Location: [facility] CATH LAB;  Service: Cardiovascular;;   Lower extremity arterial Dopplers  06/11/2013   No occlusive disease   LUMBAR LAMINECTOMY/DECOMPRESSION MICRODISCECTOMY Right 10/07/2014   Procedure: LUMBAR LAMINECTOMY/DECOMPRESSION MICRODISCECTOMY 1 LEVEL;  Surgeon: Arley Helling, MD;  Location: [facility] NEURO ORS;  Service: Neurosurgery;  Laterality: Right;  Right L3-L4 Microdiscectomy  NM MYOVIEW  LTD  06/03/2013   Low risk study with no ischemia. EF roughly 44% with no regional wall motion abnormalities noted   PFTs  06/16/2013   Normal Volumes &  Spirometry; Moderately reduced DLCO   POLYPECTOMY     SHOULDER HEMI-ARTHROPLASTY Right 05/28/2015   Procedure: RIGHT SHOULDER HEMI-ARTHROPLASTY CTA HEAD AND SUBSCAP REPAIR ;  Surgeon: Marcey Her, MD;  Location: [facility] OR;  Service: Orthopedics;  Laterality: Right;    TRANSTHORACIC ECHOCARDIOGRAM  06/11/2013   Mildly reduced EF: 45-50%. Mild anterior hypokinesis with incoordinate septal motion. No evidence of pulmonary hypertension   VIDEO BRONCHOSCOPY WITH ENDOBRONCHIAL ULTRASOUND Left 02/19/2024   Procedure: BRONCHOSCOPY, WITH EBUS;  Surgeon: Shelah Lamar RAMAN, MD;  Location: [facility] ENDOSCOPY;  Service: Pulmonary;  Laterality: Left;    Family History  Problem Relation Age of Onset   Atrial fibrillation Mother        Alive at 68   COPD Mother    Hypertension Mother    Colonic polyp Mother    Hypertension Father        Alive at 31   Lung cancer Father        Chronic smoker   Factor V Leiden deficiency Father        Also Lupus anticoagulant   Heart attack Maternal Grandmother 48   Heart failure Paternal Grandmother    Diabetes Paternal Grandmother    Heart attack Brother 6       X2   Heart attack Sister 21       Deceased   Healthy Sister        x2   Healthy Son        x2   Colon cancer Neg Hx    Esophageal cancer Neg Hx    Rectal cancer Neg Hx    Stomach cancer Neg Hx     Social History Social History   Tobacco Use   Smoking status: Former    Current packs/day: 1.00    Average packs/day: 1 pack/day for 42.0 years (42.0 ttl pk-yrs)    Types: Cigarettes   Smokeless tobacco: Never   Tobacco comments:    3 cigarettes a day 02/12/2024 KRD    Quit x1 month ago 02/12/2024  Vaping Use   Vaping status: Former   Substances: Flavoring  Substance Use Topics   Alcohol use: Not Currently    Comment: rarely   Drug use: No    Allergies  Allergen Reactions   Ibuprofen     GI Upset   Isosorbide  Nitrate Other (See Comments)    CONTINUOUS HEADACHE    Oxycodone  Itching    Reaction was to straight oxycodone  15 mg tablets - no reaction to percocet   Penicillins Hives and Rash    Has patient had a PCN reaction causing immediate rash, facial/tongue/throat swelling, SOB or lightheadedness with hypotension: Yes Has patient had a PCN reaction causing  severe rash involving mucus membranes or skin necrosis: No Has patient had a PCN reaction that required hospitalization No Has patient had a PCN reaction occurring within the last 10 years: No If all of the above answers are NO, then may proceed with Cephalosporin use.    Current Outpatient Medications  Medication Sig Dispense Refill   albuterol  (VENTOLIN  HFA) 108 (90 Base) MCG/ACT inhaler Inhale 2 puffs into the lungs every 6 (six) hours as needed for wheezing or shortness of breath. 8 g 2   ALPRAZolam  (XANAX ) 0.5 MG tablet Take 1 tablet (0.5 mg total) by  mouth at bedtime as needed for anxiety. 30 tablet 0   aspirin  EC 81 MG tablet Take 81 mg by mouth daily.     atorvastatin  (LIPITOR) 10 MG tablet Take 1 tablet (10 mg total) by mouth daily. 90 tablet 3   budesonide-glycopyrrolate -formoterol (BREZTRI  AEROSPHERE) 160-9-4.8 MCG/ACT AERO inhaler Inhale 2 puffs into the lungs in the morning and at bedtime. 10.7 each 6   budesonide-glycopyrrolate -formoterol (BREZTRI  AEROSPHERE) 160-9-4.8 MCG/ACT AERO inhaler Inhale 2 puffs into the lungs in the morning and at bedtime.     clotrimazole  (MYCELEX ) 10 MG troche Take 1 tablet (10 mg total) by mouth 3 (three) times daily. 21 Troche 0   colchicine  0.6 MG tablet Take 1 tablet (0.6 mg total) by mouth daily as needed (gout flares).     lisinopril  (ZESTRIL ) 10 MG tablet Take 1 tablet (10 mg total) by mouth daily. 90 tablet 3   Multiple Vitamins-Minerals (CENTRUM SILVER 50+MEN) TABS Take 1 tablet by mouth daily.     nitroGLYCERIN  (NITROSTAT ) 0.4 MG SL tablet Place 1 tablet (0.4 mg total) under the tongue every 5 (five) minutes as needed for chest pain. 30 tablet 1   NON FORMULARY Pt uses a c-pap nightly     omeprazole  (PRILOSEC ) 20 MG capsule Take 1 capsule (20 mg total) by mouth daily. 90 capsule 3   sertraline  (ZOLOFT ) 50 MG tablet Take 2 tabs in the morning and 1 tab in the evening. 270 tablet 3   tiZANidine  (ZANAFLEX ) 4 MG tablet Take 1 tablet (4 mg  total) by mouth at bedtime as needed for muscle spasms. 30 tablet 2   triamcinolone  cream (KENALOG ) 0.1 % Apply 1 Application topically 2 (two) times daily as needed (itch). 30 g 1   No current facility-administered medications for this visit.    Review of Systems  Constitutional: positive for fatigue Eyes: negative Ears, nose, mouth, throat, and face: negative Respiratory: positive for cough and dyspnea on exertion Cardiovascular: negative Gastrointestinal: negative Genitourinary:negative Integument/breast: negative Hematologic/lymphatic: negative Musculoskeletal:negative Neurological: negative Behavioral/Psych: negative Endocrine: negative Allergic/Immunologic: negative  Physical Exam  Charles Grimes, healthy, no distress, well nourished, and well developed SKIN: skin color, texture, turgor are normal, no rashes or significant lesions HEAD: Normocephalic, No masses, lesions, tenderness or abnormalities EYES: normal, PERRLA, Conjunctiva are pink and non-injected EARS: External ears normal, Canals clear OROPHARYNX:no exudate, no erythema, and lips, buccal mucosa, and tongue normal  NECK: supple, no adenopathy, no JVD LYMPH:  no palpable lymphadenopathy, no hepatosplenomegaly LUNGS: clear to auscultation , and palpation HEART: regular rate & rhythm, no murmurs, and no gallops ABDOMEN:abdomen soft, non-tender, normal bowel sounds, and no masses or organomegaly BACK: Back symmetric, no curvature., No CVA tenderness EXTREMITIES:no joint deformities, effusion, or inflammation, no edema  NEURO: alert & oriented x 3 with fluent speech, no focal motor/sensory deficits  PERFORMANCE STATUS: ECOG 1  LABORATORY DATA: Lab Results  Component Value Date   WBC 9.2 03/26/2024   HGB 13.6 03/26/2024   HCT 42.2 03/26/2024   MCV 89.0 03/26/2024   PLT 166 03/26/2024      Chemistry      Component Value Date/Time   NA 142 03/26/2024 1114   K 4.7 03/26/2024 1114   CL 111 03/26/2024 1114    CO2 27 03/26/2024 1114   BUN 25 (H) 03/26/2024 1114   CREATININE 1.06 03/26/2024 1114   CREATININE 0.84 04/30/2015 1738      Component Value Date/Time   CALCIUM  8.7 (L) 03/26/2024 1114   ALKPHOS 115 03/26/2024 1114  AST 18 03/26/2024 1114   ALT 14 03/26/2024 1114   BILITOT 0.4 03/26/2024 1114       RADIOGRAPHIC STUDIES: MR BRAIN W WO CONTRAST Result Date: 03/06/2024 CLINICAL DATA:  Provided history: Malignant neoplasm of unspecified part of unspecified bronchus or lung. Non-small cell lung cancer, staging. EXAM: MRI HEAD WITHOUT AND WITH CONTRAST TECHNIQUE: Multiplanar, multiecho pulse sequences of the brain and surrounding structures were obtained without and with intravenous contrast. CONTRAST:  9 mL Vueway  intravenous contrast. COMPARISON:  None. FINDINGS: Brain: No age-advanced or lobar predominant cerebral atrophy. Multifocal T2 FLAIR hyperintense signal abnormality within the cerebral white matter, nonspecific but compatible with mild chronic small vessel ischemic disease. 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 There is no acute infarct. No evidence of an intracranial mass. No chronic intracranial blood products. No extra-axial fluid collection. No midline shift. No pathologic intracranial enhancement identified. Vascular: Maintained flow voids within the proximal large arterial vessels. Skull and upper cervical spine: No focal worrisome marrow lesion. Sinuses/Orbits: No mass or acute finding within the imaged orbits. Trace mucosal thickening within the bilateral ethmoid and left maxillary sinuses. IMPRESSION: 1. No evidence of intracranial metastatic disease. 2. Mild chronic small vessel ischemic changes within the cerebral white matter. 3. 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 Electronically Signed   By: Rockey Childs D.O.   On: 03/06/2024 08:55    ASSESSMENT: This is a very pleasant 68 years old white male diagnosed in  August 2025 with stage IIIa (T3, N2, M0) non-small cell lung cancer, adenocarcinoma presented with left suprahilar mass in addition to suspicious subcarinal lymphadenopathy.  Molecular studies are still pending.   PLAN: I had a lengthy discussion with the patient and his family today about his current condition and treatment options.  I personally and independently reviewed the imaging studies and pathology report and discussed with the patient and his family. Assessment and Plan Assessment & Plan Stage IIIA non-small cell lung cancer, left upper lobe, adenocarcinoma type Diagnosed with stage IIIA non-small cell lung cancer, adenocarcinoma type, located in the left upper lobe. The mass measures 5.5 by 3.8 cm in the left suprahilar area. PET scan showed the mass is hypermetabolic with a suspicious lymph node in the subcarinal area. Surgery is not an option due to the central location of the tumor, which would require removal of the entire lung. - Order genetic marker testing on the tumor - Administer concurrent chemotherapy and radiation therapy starting April 07, 2024 - Schedule port placement for chemotherapy administration - Prescribe nausea medication and lidocaine  cream for port site - Monitor blood counts, kidney, and liver function during treatment - Provide chemotherapy education class - Potential for subsequent immunotherapy based on response  Hemoptysis Reported coughing up a small amount of blood this morning, indicating possible tumor-related bleeding. - Evaluate during follow-up visits  Tobacco use Significant history of tobacco use, having smoked for 52 years before quitting approximately one month ago. Smoking cessation is crucial in the management of lung cancer and overall health. - Support with nicotine  patch as needed He was advised to call immediately if he has any other concerning symptoms in the interval.   The patient voices understanding of current disease status  and treatment options and is in agreement with the current care plan.  All questions were answered. The patient knows to call the clinic with any problems, questions or concerns. We can certainly see the patient much sooner if necessary.  Thank  you so much for allowing me to participate in the care of Charles Grimes. I will continue to follow up the patient with you and assist in his care.  The total time spent in the appointment was 90 minutes including review of chart and various tests results, discussions about plan of care and coordination of care plan .   Disclaimer: This note was dictated with voice recognition software. Similar sounding words can inadvertently be transcribed and may not be corrected upon review.   Sherrod POUR Gemini Bunte March 26, 2024, 12:00 PM

## 2024-03-26 NOTE — Progress Notes (Signed)
 START ON PATHWAY REGIMEN - Non-Small Cell Lung     A cycle is every 7 days, concurrent with RT:     Paclitaxel      Carboplatin   **Always confirm dose/schedule in your pharmacy ordering system**  Patient Characteristics: Preoperative or Nonsurgical Candidate (Clinical Staging), Stage IIB (N2a only) or Stage III - Nonsurgical Candidate, PS = 0,1 Therapeutic Status: Preoperative or Nonsurgical Candidate (Clinical Staging) AJCC T Category: cT3 AJCC N Category: cN2a AJCC M Category: cM0 AJCC 9 Stage Grouping: IIIA Check here if patient was staged using an edition other than AJCC Staging 9th Edition: false ECOG Performance Status: 1 Intent of Therapy: Curative Intent, Discussed with Patient

## 2024-03-27 ENCOUNTER — Other Ambulatory Visit: Payer: Self-pay

## 2024-03-31 ENCOUNTER — Encounter: Payer: Self-pay | Admitting: Internal Medicine

## 2024-03-31 ENCOUNTER — Telehealth: Payer: Self-pay | Admitting: Internal Medicine

## 2024-04-01 ENCOUNTER — Other Ambulatory Visit: Payer: Self-pay

## 2024-04-01 DIAGNOSIS — I878 Other specified disorders of veins: Secondary | ICD-10-CM

## 2024-04-02 ENCOUNTER — Ambulatory Visit
Admission: RE | Admit: 2024-04-02 | Discharge: 2024-04-02 | Disposition: A | Discharge status: Home / Self Care | Source: Ambulatory Visit | Attending: Internal Medicine | Admitting: Internal Medicine

## 2024-04-02 DIAGNOSIS — C3490 Malignant neoplasm of unspecified part of unspecified bronchus or lung: Secondary | ICD-10-CM | POA: Diagnosis not present

## 2024-04-02 DIAGNOSIS — I878 Other specified disorders of veins: Secondary | ICD-10-CM

## 2024-04-02 DIAGNOSIS — Z452 Encounter for adjustment and management of vascular access device: Secondary | ICD-10-CM | POA: Diagnosis not present

## 2024-04-02 HISTORY — PX: IR IMAGING GUIDED PORT INSERTION: IMG5740

## 2024-04-02 MED ORDER — SODIUM CHLORIDE 0.9 % IV SOLN: INTRAVENOUS | Status: DC

## 2024-04-02 MED ORDER — MIDAZOLAM HCL 2 MG/2ML IJ SOLN
INTRAMUSCULAR | Status: AC | PRN
  Administered 2024-04-02 (×2): 1 mg via INTRAVENOUS

## 2024-04-02 MED ORDER — FENTANYL CITRATE PF 50 MCG/ML IJ SOSY: 25.0000 ug | PREFILLED_SYRINGE | INTRAMUSCULAR | Status: DC | PRN

## 2024-04-02 MED ORDER — HEPARIN SOD (PORK) LOCK FLUSH 100 UNIT/ML IV SOLN
500.0000 [IU] | Freq: Once | INTRAVENOUS | Status: AC
  Administered 2024-04-02: 500 [IU]

## 2024-04-02 MED ORDER — LIDOCAINE-EPINEPHRINE 1 %-1:100000 IJ SOLN
20.0000 mL | Freq: Once | INTRAMUSCULAR | Status: AC
  Administered 2024-04-02: 20 mL via INTRADERMAL

## 2024-04-02 MED ORDER — FENTANYL CITRATE (PF) 100 MCG/2ML IJ SOLN
INTRAMUSCULAR | Status: AC | PRN
  Administered 2024-04-02 (×2): 50 ug via INTRAVENOUS

## 2024-04-02 MED ORDER — MIDAZOLAM HCL 2 MG/2ML IJ SOLN: 1.0000 mg | INTRAMUSCULAR | Status: DC | PRN

## 2024-04-02 NOTE — H&P (Addendum)
 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 male with pmhx of HTN, HLD, CAD, OSA. He was recently diagnosed with adenocarcinoma found initially on CT 01/21/24 after having chest pain and SOB. This was later confirmed with bronchoscopy biopsy on 02/19/24. He has since established care with Dr. Sherrod of oncology, with plan to begin chemo and radiation therapy on 04/07/24. He has been referred to IR service for port-a-catheter placement for chemotherapy administration, for which patient is presenting today.  Today pt resting comfortably without complaint. Has been NPO since midnight. All questions answered.   Patient is Full Code  Past Medical History:  Diagnosis Date   Anginal pain (HCC)    Anxiety    Arthritis    Coronary artery disease, non-occlusive 08/2013   Mild to moderate (40% OM1) single vessel CAD. Otherwise nonobstructed.   Depression    Dyslipidemia, goal LDL below 100    Essential hypertension    Family history of premature CAD    GERD (gastroesophageal reflux disease)    H/O skin disorder    Involving hands. Subsequently resolved.    Nonischemic cardiomyopathy (HCC) 05/2013   Non-ischemic: EF ~45% by Echo & Myoview  --> Non-obstructive CAD   Obesity (BMI 30.0-34.9)    OSA (obstructive sleep apnea) 06/21/2017   uses CPAP   Sleep apnea     Past Surgical History:  Procedure Laterality Date   COLONOSCOPY  2015   COLONOSCOPY WITH PROPOFOL   01/02/2017   Dr.Pyrtle   LEFT HEART CATHETERIZATION WITH CORONARY ANGIOGRAM  08/26/2013   Mild to moderate disease with 40-50% stenosis and OM1. Otherwise no significant CAD --  Surgeon: Alm LELON Clay, MD;  Location: [facility] CATH LAB;  Service: Cardiovascular;;   Lower  extremity arterial Dopplers  06/11/2013   No occlusive disease   LUMBAR LAMINECTOMY/DECOMPRESSION MICRODISCECTOMY Right 10/07/2014   Procedure: LUMBAR LAMINECTOMY/DECOMPRESSION MICRODISCECTOMY 1 LEVEL;  Surgeon: Arley Helling, MD;  Location: [facility] NEURO ORS;  Service: Neurosurgery;  Laterality: Right;  Right L3-L4 Microdiscectomy   NM MYOVIEW  LTD  06/03/2013   Grimes risk study with no ischemia. EF roughly 44% with no regional wall motion abnormalities noted   PFTs  06/16/2013   Normal Volumes &  Spirometry; Moderately reduced DLCO   POLYPECTOMY     SHOULDER HEMI-ARTHROPLASTY Right 05/28/2015   Procedure: RIGHT SHOULDER HEMI-ARTHROPLASTY CTA HEAD AND SUBSCAP REPAIR ;  Surgeon: Marcey Her, MD;  Location: [facility] OR;  Service: Orthopedics;  Laterality: Right;   TRANSTHORACIC ECHOCARDIOGRAM  06/11/2013   Mildly reduced EF: 45-50%. Mild anterior hypokinesis with incoordinate septal motion. No evidence of pulmonary hypertension   VIDEO BRONCHOSCOPY WITH ENDOBRONCHIAL ULTRASOUND Left 02/19/2024   Procedure: BRONCHOSCOPY, WITH EBUS;  Surgeon: Shelah Lamar RAMAN, MD;  Location: [facility] ENDOSCOPY;  Service: Pulmonary;  Laterality: Left;    Allergies: Ibuprofen, Isosorbide  nitrate, Oxycodone , and Penicillins  Medications: Prior to Admission medications   Medication Sig Start Date End Date Taking? Authorizing Provider  albuterol  (VENTOLIN  HFA) 108 (90 Base) MCG/ACT inhaler Inhale 2 puffs into the lungs every 6 (six) hours as needed for wheezing or shortness of breath. 02/25/24   Ruthell Lauraine FALCON, NP  ALPRAZolam  (XANAX ) 0.5 MG tablet Take 1 tablet (0.5 mg total) by mouth at bedtime as needed for anxiety. 10/24/23   Wendling,  Mabel Mt, DO  aspirin  EC 81 MG tablet Take 81 mg by mouth daily.    [provider]  atorvastatin  (LIPITOR) 10 MG tablet Take 1 tablet (10 mg total) by mouth daily. 10/24/23   [name], DO  budesonide-glycopyrrolate -formoterol (BREZTRI  AEROSPHERE) 160-9-4.8 MCG/ACT AERO inhaler  Inhale 2 puffs into the lungs in the morning and at bedtime. 02/25/24   Ruthell Lauraine FALCON, NP  budesonide-glycopyrrolate -formoterol (BREZTRI  AEROSPHERE) 160-9-4.8 MCG/ACT AERO inhaler Inhale 2 puffs into the lungs in the morning and at bedtime. 02/25/24   Ruthell Lauraine FALCON, NP  clotrimazole  (MYCELEX ) 10 MG troche Take 1 tablet (10 mg total) by mouth 3 (three) times daily. 02/25/24   Ruthell Lauraine FALCON, NP  colchicine  0.6 MG tablet Take 1 tablet (0.6 mg total) by mouth daily as needed (gout flares). 02/19/24   Shelah Lamar RAMAN, MD  lidocaine -prilocaine  (EMLA ) cream Apply to affected area once 03/26/24   Sherrod Sherrod, MD  lisinopril  (ZESTRIL ) 10 MG tablet Take 1 tablet (10 mg total) by mouth daily. 01/14/24   [name], DO  Multiple Vitamins-Minerals (CENTRUM SILVER 50+MEN) TABS Take 1 tablet by mouth daily.    [provider]  nitroGLYCERIN  (NITROSTAT ) 0.4 MG SL tablet Place 1 tablet (0.4 mg total) under the tongue every 5 (five) minutes as needed for chest pain. 02/08/21   [name], DO  NON FORMULARY Pt uses a c-pap nightly    [provider]  omeprazole  (PRILOSEC ) 20 MG capsule Take 1 capsule (20 mg total) by mouth daily. 01/14/24   [name], DO  ondansetron  (ZOFRAN ) 8 MG tablet Take 1 tablet (8 mg total) by mouth every 8 (eight) hours as needed for nausea or vomiting. Start on the third day after chemotherapy. 03/26/24   Sherrod Sherrod, MD  prochlorperazine  (COMPAZINE ) 10 MG tablet Take 1 tablet (10 mg total) by mouth every 6 (six) hours as needed for nausea or vomiting. 03/26/24   Sherrod Sherrod, MD  sertraline  (ZOLOFT ) 50 MG tablet Take 2 tabs in the morning and 1 tab in the evening. 01/14/24   [name], DO  tiZANidine  (ZANAFLEX ) 4 MG tablet Take 1 tablet (4 mg total) by mouth at bedtime as needed for muscle spasms. 01/14/24   [name], DO  triamcinolone  cream (KENALOG ) 0.1 % Apply 1 Application topically 2 (two) times  daily as needed (itch). 11/12/23   [name], DO     Family History  Problem Relation Age of Onset   Atrial fibrillation Mother        Alive at 45   COPD Mother    Hypertension Mother    Colonic polyp Mother    Hypertension Father        Alive at 27   Lung cancer Father        Chronic smoker   Factor V Leiden deficiency Father        Also Lupus anticoagulant   Heart attack Maternal Grandmother 48   Heart failure Paternal Grandmother    Diabetes Paternal Grandmother    Heart attack Brother 48       X2   Heart attack Sister 70       Deceased   Healthy Sister        x2   Healthy Son        x2   Colon cancer Neg Hx    Esophageal cancer Neg Hx    Rectal cancer Neg Hx    Stomach cancer  Neg Hx     Social History   Socioeconomic History   Marital status: Married    Spouse name: Not on file   Number of children: Not on file   Years of education: Not on file   Highest education level: Not on file  Occupational History   Not on file  Tobacco Use   Smoking status: Former    Current packs/day: 1.00    Average packs/day: 1 pack/day for 42.0 years (42.0 ttl pk-yrs)    Types: Cigarettes   Smokeless tobacco: Never   Tobacco comments:    3 cigarettes a day 02/12/2024 KRD    Quit x1 month ago 02/12/2024  Vaping Use   Vaping status: Former   Substances: Flavoring  Substance and Sexual Activity   Alcohol use: Not Currently    Comment: rarely   Drug use: No   Sexual activity: Yes    Partners: Female  Other Topics Concern   Not on file  Social History Narrative   Married father of 2 boys. Lives with wife, Tobias, and one son.   He works as a Systems developer for [employer]   He currently smokes one pack a day, cut down from 2 packs per day.   Only occasional beer and mixed drinks.   Does not exercise because he is too tired.   Social Drivers of Corporate investment banker Strain: Grimes Risk  (10/02/2023)    Overall Financial Resource Strain (CARDIA)    Difficulty of Paying Living Expenses: Not hard at all  Food Insecurity: No Food Insecurity (03/04/2024)   Hunger Vital Sign    Worried About Running Out of Food in the Last Year: Never true    Ran Out of Food in the Last Year: Never true  Transportation Needs: No Transportation Needs (03/04/2024)   PRAPARE - Administrator, Civil Service (Medical): No    Lack of Transportation (Non-Medical): No  Physical Activity: Inactive (10/02/2023)   Exercise Vital Sign    Days of Exercise per Week: 0 days    Minutes of Exercise per Session: 0 min  Stress: No Stress Concern Present (10/02/2023)   [employer] of Occupational Health - Occupational Stress Questionnaire    Feeling of Stress : Not at all  Social Connections: Socially Isolated (10/02/2023)   Social Connection and Isolation Panel    Frequency of Communication with Friends and Family: More than three times a week    Frequency of Social Gatherings with Friends and Family: More than three times a week    Attends Religious Services: Never    Database administrator or Organizations: No    Attends Banker Meetings: Never    Marital Status: Widowed     Review of Systems: A 12 point ROS discussed and pertinent positives are indicated in the HPI above.  All other systems are negative.   Vital Signs: BP (!) 142/79 (BP Location: Left Arm, Patient Position: Sitting, Cuff Size: Normal)   Pulse (!) 57   Temp 97.7 F (36.5 C) (Oral)   Resp 18   Ht 5' 11 (1.803 m)   Wt 188 lb (85.3 kg)   SpO2 96%   BMI 26.22 kg/m   Advance Care Plan: No documents on file  Physical Exam Vitals and nursing note reviewed.  Constitutional:      General: He is not in acute distress. HENT:     Mouth/Throat:  Mouth: Mucous membranes are moist.     Pharynx: Oropharynx is clear.  Cardiovascular:     Rate and Rhythm: Normal rate and regular rhythm.  Pulmonary:     Effort: Pulmonary  effort is normal.     Breath sounds: Normal breath sounds.  Abdominal:     Palpations: Abdomen is soft.     Tenderness: There is no abdominal tenderness.  Musculoskeletal:     Right lower leg: No edema.     Left lower leg: No edema.  Skin:    General: Skin is warm and dry.  Neurological:     Mental Status: He is alert and oriented to person, place, and time. Mental status is at baseline.     Imaging: No results found.  Labs:  CBC: Recent Labs    05/25/23 1139 07/06/23 2338 02/19/24 1218 03/26/24 1114  WBC 6.7 10.1 9.9 9.2  HGB 13.8 12.3* 14.1 13.6  HCT 42.0 37.8* 45.6 42.2  PLT 152.0 158 157 166    COAGS: No results for input(s): INR, APTT in the last 8760 hours.  BMP: Recent Labs    05/17/23 0423 05/25/23 1139 07/06/23 2338 07/10/23 1233 01/14/24 0838 02/19/24 1218 03/26/24 1114  NA 138   < > 138 139 139 139 142  K 3.8   < > 3.5 3.9 4.3 4.5 4.7  CL 109   < > 110 104 106 107 111  CO2 24   < > 20* 28 25 21* 27  GLUCOSE 93   < > 93 94 98 81 80  BUN 22   < > 16 22 18 19  25*  CALCIUM  7.9*   < > 8.2* 8.7 8.5 8.4* 8.7*  CREATININE 0.88   < > 0.88 0.83 1.03 0.99 1.06  GFRNONAA >60  --  >60  --   --  >60 >60   < > = values in this interval not displayed.    LIVER FUNCTION TESTS: Recent Labs    05/25/23 1139 07/06/23 2338 01/14/24 0838 03/26/24 1114  BILITOT 0.6 0.6 0.6 0.4  AST 18 24 16 18   ALT 15 19 11 14   ALKPHOS 117 115 159* 115  PROT 7.0 6.9 7.2 7.3  ALBUMIN 3.7 3.1* 3.6 3.7    TUMOR MARKERS: No results for input(s): AFPTM, CEA, CA199, CHROMGRNA in the last 8760 hours.  Assessment and Plan:  Charles Grimes is a 68 y.o. male with pmhx of HTN, HLD, CAD, OSA. He was recently diagnosed with adenocarcinoma found initially on CT 01/21/24 after having chest pain and SOB. This was later confirmed with bronchoscopy biopsy on 02/19/24. He has since established care with Dr. Sherrod of oncology, with plan to begin chemo and radiation therapy on  04/07/24. He has been referred to IR service for port-a-catheter placement for chemotherapy administration, for which patient is presenting today.  Risks and benefits of image-guided Port-a-catheter placement were discussed with the patient including, but not limited to bleeding, infection, pneumothorax, or fibrin sheath development and need for additional procedures. All of the patient's questions were answered, patient is agreeable to proceed. Consent signed and in chart.   Thank you for allowing our service to participate in Charles Grimes 's care.  Electronically Signed: Kimble VEAR Clas, PA-C   04/02/2024, 8:16 AM      I spent a total of  30 Minutes   in face to face in clinical consultation, greater than 50% of which was counseling/coordinating care for port-a-catheter placement

## 2024-04-02 NOTE — Discharge Instructions (Signed)
 Implanted Port Insertion After Care       After the procedure, it is common to have discomfort at the port insertion site, as well as bruising on the skin over the port. This should improve over 3-4 days.    After your port is placed, you will get a manufacturer's information card. The card has information about your port. Keep this card with you at all times.      Follow instructions from your health care provider about how to take care of your port insertion site. Make sure you wash your hands with soap and water for at least 20 seconds before and after you change your bandage (dressing). If soap and water are not available, use hand sanitizer.  Leave your initial bandage on for a full 24 hours. After 24 hours you may remove the dressing and shower.  Do not scrub directly on the incision site but rather above it and let the soapy water run over the incision. Pat dry after. You may then opt to redress your incision for your comfort but you may just leave it open to air.  The Dermabond (surgical super glue) will protect your incision and keep it clean and dry. Leave the layer of skin glue in place. If the glue edges start to loosen and curl up, you may trim the loose edges but do not pull or pick at it. Do NOT apply neosporin or other antibacterial ointment to the surgical glue.  It will dissolve the glue and expose your new incision to possible infection.  Do NOT apply EMLA  numbing cream to the surgical glue.  It will dissolve the glue and expose your new incision to possible infection.  You may have to wait to use the EMLA  cream until your incision has healed.    Return to your normal activities as told by your health care provider. Ask your health care provider what activities are safe for you. You may resume over-the-counter and prescription medicines as prescribed by your health care provider. Do not take baths, swim, or use a hot tub until your incision has healed completely (usually 2 weeks).      You were given a sedative during the procedure, and it can affect you for several hours. Do not drive, operate machinery or sign important documents for 24 hours after your procedure.     Please contact our office at [phone] and ask to speak with the nurse if you have any signs of an infection, such as fever or chills, redness, swelling, or pain around your port insertion site, fluid or blood coming from your port insertion site, if your port insertion site feels warm to the touch and/or if you have pus or a bad smell coming from the port insertion site.       If you need to speak to someone after hours, please call the Interventional Radiology on-call service at [phone] and tell them you are a patient of Dr. Terrill and you had a portacath placed today, as well as any issues you are having.    Thank you for visiting [facility] today!

## 2024-04-02 NOTE — Procedures (Signed)
 Interventional Radiology Procedure Note  Procedure: Single Lumen Power Port Placement    Access:  Right internal jugular vein  Findings: Catheter tip positioned at cavoatrial junction. Port is ready for immediate use.   Complications: None  EBL: < 10 mL  Recommendations:  - Ok to shower in 24 hours - Do not submerge for 7 days - Routine line care    Ester Sides, MD

## 2024-04-02 NOTE — Progress Notes (Signed)
 Pharmacist Chemotherapy Monitoring - Initial Assessment    Anticipated start date: 04/09/24   The following has been reviewed per standard work regarding the patient's treatment regimen: The patient's diagnosis, treatment plan and drug doses, and organ/hematologic function Lab orders and baseline tests specific to treatment regimen  The treatment plan start date, drug sequencing, and pre-medications Prior authorization status  Patient's documented medication list, including drug-drug interaction screen and prescriptions for anti-emetics and supportive care specific to the treatment regimen The drug concentrations, fluid compatibility, administration routes, and timing of the medications to be used The patient's access for treatment and lifetime cumulative dose history, if applicable  The patient's medication allergies and previous infusion related reactions, if applicable   Changes made to treatment plan:  N/A  Follow up needed:  N/A   Marlee Eleanor Neighbors, RPH, 04/02/2024  12:59 PM

## 2024-04-07 ENCOUNTER — Other Ambulatory Visit

## 2024-04-08 ENCOUNTER — Ambulatory Visit
Admission: RE | Admit: 2024-04-08 | Discharge: 2024-04-08 | Disposition: A | Discharge status: Home / Self Care | Source: Ambulatory Visit | Attending: Radiation Oncology | Admitting: Radiation Oncology

## 2024-04-08 ENCOUNTER — Inpatient Hospital Stay

## 2024-04-08 DIAGNOSIS — K5900 Constipation, unspecified: Secondary | ICD-10-CM | POA: Insufficient documentation

## 2024-04-08 DIAGNOSIS — Z510 Encounter for antineoplastic radiation therapy: Secondary | ICD-10-CM | POA: Insufficient documentation

## 2024-04-08 DIAGNOSIS — C3412 Malignant neoplasm of upper lobe, left bronchus or lung: Secondary | ICD-10-CM | POA: Insufficient documentation

## 2024-04-08 LAB — CBC WITH DIFFERENTIAL (CANCER CENTER ONLY)
Abs Immature Granulocytes: 0.03 K/uL (ref 0.00–0.07)
Basophils Absolute: 0.1 K/uL (ref 0.0–0.1)
Basophils Relative: 1 %
Eosinophils Absolute: 0.1 K/uL (ref 0.0–0.5)
Eosinophils Relative: 1 %
HCT: 43.6 % (ref 39.0–52.0)
Hemoglobin: 14.3 g/dL (ref 13.0–17.0)
Immature Granulocytes: 0 %
Lymphocytes Relative: 22 %
Lymphs Abs: 2.2 K/uL (ref 0.7–4.0)
MCH: 28.9 pg (ref 26.0–34.0)
MCHC: 32.8 g/dL (ref 30.0–36.0)
MCV: 88.3 fL (ref 80.0–100.0)
Monocytes Absolute: 0.7 K/uL (ref 0.1–1.0)
Monocytes Relative: 7 %
Neutro Abs: 6.7 K/uL (ref 1.7–7.7)
Neutrophils Relative %: 69 %
Platelet Count: 159 K/uL (ref 150–400)
RBC: 4.94 MIL/uL (ref 4.22–5.81)
RDW: 13.9 % (ref 11.5–15.5)
WBC Count: 9.8 K/uL (ref 4.0–10.5)
nRBC: 0 % (ref 0.0–0.2)

## 2024-04-08 LAB — CMP (CANCER CENTER ONLY)
ALT: 17 U/L (ref 0–44)
AST: 19 U/L (ref 15–41)
Albumin: 3.8 g/dL (ref 3.5–5.0)
Alkaline Phosphatase: 138 U/L — ABNORMAL HIGH (ref 38–126)
Anion gap: 4 — ABNORMAL LOW (ref 5–15)
BUN: 20 mg/dL (ref 8–23)
CO2: 28 mmol/L (ref 22–32)
Calcium: 8.9 mg/dL (ref 8.9–10.3)
Chloride: 109 mmol/L (ref 98–111)
Creatinine: 1.05 mg/dL (ref 0.61–1.24)
GFR, Estimated: >60 mL/min (ref >60)
Glucose, Bld: 84 mg/dL (ref 70–99)
Potassium: 4.2 mmol/L (ref 3.5–5.1)
Sodium: 141 mmol/L (ref 135–145)
Total Bilirubin: 0.7 mg/dL (ref 0.0–1.2)
Total Protein: 7.6 g/dL (ref 6.5–8.1)

## 2024-04-09 ENCOUNTER — Inpatient Hospital Stay

## 2024-04-09 VITALS — BP 115/76 | HR 56 | Temp 97.7°F | Resp 14 | Wt 190.0 lb

## 2024-04-09 DIAGNOSIS — Z510 Encounter for antineoplastic radiation therapy: Secondary | ICD-10-CM | POA: Diagnosis not present

## 2024-04-09 DIAGNOSIS — C3412 Malignant neoplasm of upper lobe, left bronchus or lung: Secondary | ICD-10-CM

## 2024-04-09 MED ORDER — DEXAMETHASONE SODIUM PHOSPHATE 10 MG/ML IJ SOLN
10.0000 mg | Freq: Once | INTRAMUSCULAR | Status: AC
  Administered 2024-04-09: 10 mg via INTRAVENOUS
  Filled 2024-04-09: qty 1

## 2024-04-09 MED ORDER — SODIUM CHLORIDE 0.9 % IV SOLN
216.2000 mg | Freq: Once | INTRAVENOUS | Status: AC
  Administered 2024-04-09: 220 mg via INTRAVENOUS
  Filled 2024-04-09: qty 22

## 2024-04-09 MED ORDER — SODIUM CHLORIDE 0.9 % IV SOLN
45.0000 mg/m2 | Freq: Once | INTRAVENOUS | Status: AC
  Administered 2024-04-09: 96 mg via INTRAVENOUS
  Filled 2024-04-09: qty 16

## 2024-04-09 MED ORDER — PALONOSETRON HCL INJECTION 0.25 MG/5ML
0.2500 mg | Freq: Once | INTRAVENOUS | Status: AC
  Administered 2024-04-09: 0.25 mg via INTRAVENOUS
  Filled 2024-04-09: qty 5

## 2024-04-09 MED ORDER — FAMOTIDINE IN NACL 20-0.9 MG/50ML-% IV SOLN
20.0000 mg | Freq: Once | INTRAVENOUS | Status: AC
  Administered 2024-04-09: 20 mg via INTRAVENOUS
  Filled 2024-04-09: qty 50

## 2024-04-09 MED ORDER — DIPHENHYDRAMINE HCL 50 MG/ML IJ SOLN
50.0000 mg | Freq: Once | INTRAMUSCULAR | Status: AC
  Administered 2024-04-09: 50 mg via INTRAVENOUS
  Filled 2024-04-09: qty 1

## 2024-04-09 MED ORDER — SODIUM CHLORIDE 0.9 % IV SOLN: INTRAVENOUS | Status: DC

## 2024-04-09 NOTE — Patient Instructions (Signed)
 [facility] - A DEPT OF [facility]  Discharge Instructions: Thank you for choosing [facility] to provide your oncology and hematology care.   If you have a lab appointment with the Cancer Center, please go directly to the Cancer Center and check in at the registration area.   Wear comfortable clothing and clothing appropriate for easy access to any Portacath or PICC line.   We strive to give you quality time with your provider. You may need to reschedule your appointment if you arrive late (15 or more minutes).  Arriving late affects you and other patients whose appointments are after yours.  Also, if you miss three or more appointments without notifying the office, you may be dismissed from the clinic at the provider's discretion.      For prescription refill requests, have your pharmacy contact our office and allow 72 hours for refills to be completed.    Today you received the following chemotherapy and/or immunotherapy agents: Paclitaxel, Carboplatin      To help prevent nausea and vomiting after your treatment, we encourage you to take your nausea medication as directed.  BELOW ARE SYMPTOMS THAT SHOULD BE REPORTED IMMEDIATELY: *FEVER GREATER THAN 100.4 F (38 C) OR HIGHER *CHILLS OR SWEATING *NAUSEA AND VOMITING THAT IS NOT CONTROLLED WITH YOUR NAUSEA MEDICATION *UNUSUAL SHORTNESS OF BREATH *UNUSUAL BRUISING OR BLEEDING *URINARY PROBLEMS (pain or burning when urinating, or frequent urination) *BOWEL PROBLEMS (unusual diarrhea, constipation, pain near the anus) TENDERNESS IN MOUTH AND THROAT WITH OR WITHOUT PRESENCE OF ULCERS (sore throat, sores in mouth, or a toothache) UNUSUAL RASH, SWELLING OR PAIN  UNUSUAL VAGINAL DISCHARGE OR ITCHING   Items with * indicate a potential emergency and should be followed up as soon as possible or go to the Emergency Department if any problems should occur.  Please show the CHEMOTHERAPY ALERT CARD or  IMMUNOTHERAPY ALERT CARD at check-in to the Emergency Department and triage nurse.  Should you have questions after your visit or need to cancel or reschedule your appointment, please contact [facility] - A DEPT OF [facility]  Dept: [phone]  and follow the prompts.  Office hours are 8:00 a.m. to 4:30 p.m. Monday - Friday. Please note that voicemails left after 4:00 p.m. may not be returned until the following business day.  We are closed weekends and major holidays. You have access to a nurse at all times for urgent questions. Please call the main number to the clinic Dept: [phone] and follow the prompts.   For any non-urgent questions, you may also contact your provider using MyChart. We now offer e-Visits for anyone 62 and older to request care online for non-urgent symptoms. For details visit [email or web address].   Also download the MyChart app! Go to the app store, search "MyChart", open the app, select , and log in with your MyChart username and password.  Paclitaxel Injection What is this medication? PACLITAXEL (PAK li TAX el) treats some types of cancer. It works by slowing down the growth of cancer cells. This medicine may be used for other purposes; ask your health care provider or pharmacist if you have questions. COMMON BRAND NAME(S): Onxol, Taxol What should I tell my care team before I take this medication? They need to know if you have any of these conditions: Heart disease Liver disease Low white blood cell levels An unusual or allergic reaction to paclitaxel, other medications,  foods, dyes, or preservatives If you or your partner are pregnant or trying to get pregnant Breast-feeding How should I use this medication? This medication is injected into a vein. It is given by your care team in a hospital or clinic setting. Talk to your care team about the use of this medication in children. While it may be given to children  for selected conditions, precautions do apply. Overdosage: If you think you have taken too much of this medicine contact a poison control center or emergency room at once. NOTE: This medicine is only for you. Do not share this medicine with others. What if I miss a dose? Keep appointments for follow-up doses. It is important not to miss your dose. Call your care team if you are unable to keep an appointment. What may interact with this medication? Do not take this medication with any of the following: Live virus vaccines Other medications may affect the way this medication works. Talk with your care team about all of the medications you take. They may suggest changes to your treatment plan to lower the risk of side effects and to make sure your medications work as intended. This list may not describe all possible interactions. Give your health care provider a list of all the medicines, herbs, non-prescription drugs, or dietary supplements you use. Also tell them if you smoke, drink alcohol, or use illegal drugs. Some items may interact with your medicine. What should I watch for while using this medication? Your condition will be monitored carefully while you are receiving this medication. You may need blood work while taking this medication. This medication may make you feel generally unwell. This is not uncommon as chemotherapy can affect healthy cells as well as cancer cells. Report any side effects. Continue your course of treatment even though you feel ill unless your care team tells you to stop. This medication can cause serious allergic reactions. To reduce the risk, your care team may give you other medications to take before receiving this one. Be sure to follow the directions from your care team. This medication may increase your risk of getting an infection. Call your care team for advice if you get a fever, chills, sore throat, or other symptoms of a cold or flu. Do not treat yourself. Try  to avoid being around people who are sick. This medication may increase your risk to bruise or bleed. Call your care team if you notice any unusual bleeding. Be careful brushing or flossing your teeth or using a toothpick because you may get an infection or bleed more easily. If you have any dental work done, tell your dentist you are receiving this medication. Talk to your care team if you may be pregnant. Serious birth defects can occur if you take this medication during pregnancy. Talk to your care team before breastfeeding. Changes to your treatment plan may be needed. What side effects may I notice from receiving this medication? Side effects that you should report to your care team as soon as possible: Allergic reactions--skin rash, itching, hives, swelling of the face, lips, tongue, or throat Heart rhythm changes--fast or irregular heartbeat, dizziness, feeling faint or lightheaded, chest pain, trouble breathing Increase in blood pressure Infection--fever, chills, cough, sore throat, wounds that don't heal, pain or trouble when passing urine, general feeling of discomfort or being unwell Low blood pressure--dizziness, feeling faint or lightheaded, blurry vision Low red blood cell level--unusual weakness or fatigue, dizziness, headache, trouble breathing Painful swelling, warmth, or redness  of the skin, blisters or sores at the infusion site Pain, tingling, or numbness in the hands or feet Slow heartbeat--dizziness, feeling faint or lightheaded, confusion, trouble breathing, unusual weakness or fatigue Unusual bruising or bleeding Side effects that usually do not require medical attention (report to your care team if they continue or are bothersome): Diarrhea Hair loss Joint pain Loss of appetite Muscle pain Nausea Vomiting This list may not describe all possible side effects. Call your doctor for medical advice about side effects. You may report side effects to FDA at  1-800-FDA-1088. Where should I keep my medication? This medication is given in a hospital or clinic. It will not be stored at home. NOTE: This sheet is a summary. It may not cover all possible information. If you have questions about this medicine, talk to your doctor, pharmacist, or health care provider.  2024 Elsevier/Gold Standard (2021-11-22 00:00:00)  Carboplatin Injection What is this medication? CARBOPLATIN (KAR boe pla tin) treats some types of cancer. It works by slowing down the growth of cancer cells. This medicine may be used for other purposes; ask your health care provider or pharmacist if you have questions. COMMON BRAND NAME(S): Paraplatin What should I tell my care team before I take this medication? They need to know if you have any of these conditions: Blood disorders Hearing problems Kidney disease Recent or ongoing radiation therapy An unusual or allergic reaction to carboplatin, cisplatin, other medications, foods, dyes, or preservatives Pregnant or trying to get pregnant Breast-feeding How should I use this medication? This medication is injected into a vein. It is given by your care team in a hospital or clinic setting. Talk to your care team about the use of this medication in children. Special care may be needed. Overdosage: If you think you have taken too much of this medicine contact a poison control center or emergency room at once. NOTE: This medicine is only for you. Do not share this medicine with others. What if I miss a dose? Keep appointments for follow-up doses. It is important not to miss your dose. Call your care team if you are unable to keep an appointment. What may interact with this medication? Medications for seizures Some antibiotics, such as amikacin, gentamicin, neomycin, streptomycin, tobramycin Vaccines This list may not describe all possible interactions. Give your health care provider a list of all the medicines, herbs,  non-prescription drugs, or dietary supplements you use. Also tell them if you smoke, drink alcohol, or use illegal drugs. Some items may interact with your medicine. What should I watch for while using this medication? Your condition will be monitored carefully while you are receiving this medication. You may need blood work while taking this medication. This medication may make you feel generally unwell. This is not uncommon, as chemotherapy can affect healthy cells as well as cancer cells. Report any side effects. Continue your course of treatment even though you feel ill unless your care team tells you to stop. In some cases, you may be given additional medications to help with side effects. Follow all directions for their use. This medication may increase your risk of getting an infection. Call your care team for advice if you get a fever, chills, sore throat, or other symptoms of a cold or flu. Do not treat yourself. Try to avoid being around people who are sick. Avoid taking medications that contain aspirin, acetaminophen, ibuprofen, naproxen, or ketoprofen unless instructed by your care team. These medications may hide a fever. Be careful  brushing or flossing your teeth or using a toothpick because you may get an infection or bleed more easily. If you have any dental work done, tell your dentist you are receiving this medication. Talk to your care team if you wish to become pregnant or think you might be pregnant. This medication can cause serious birth defects. Talk to your care team about effective forms of contraception. Do not breast-feed while taking this medication. What side effects may I notice from receiving this medication? Side effects that you should report to your care team as soon as possible: Allergic reactions--skin rash, itching, hives, swelling of the face, lips, tongue, or throat Infection--fever, chills, cough, sore throat, wounds that don't heal, pain or trouble when passing  urine, general feeling of discomfort or being unwell Low red blood cell level--unusual weakness or fatigue, dizziness, headache, trouble breathing Pain, tingling, or numbness in the hands or feet, muscle weakness, change in vision, confusion or trouble speaking, loss of balance or coordination, trouble walking, seizures Unusual bruising or bleeding Side effects that usually do not require medical attention (report to your care team if they continue or are bothersome): Hair loss Nausea Unusual weakness or fatigue Vomiting This list may not describe all possible side effects. Call your doctor for medical advice about side effects. You may report side effects to FDA at 1-800-FDA-1088. Where should I keep my medication? This medication is given in a hospital or clinic. It will not be stored at home. NOTE: This sheet is a summary. It may not cover all possible information. If you have questions about this medicine, talk to your doctor, pharmacist, or health care provider.  2024 Elsevier/Gold Standard (2021-10-25 00:00:00)

## 2024-04-10 ENCOUNTER — Telehealth: Payer: Self-pay

## 2024-04-10 NOTE — Telephone Encounter (Signed)
 Dr. Sherrod, first time paclitaxel , carboplatin  f/u call - pt tolerated well Received: Charles Metro Katrinka DELENA, RN  P Onc Triage Nurse [facility] Caller: Unspecified [name],  3:58 PM)

## 2024-04-12 DIAGNOSIS — Z510 Encounter for antineoplastic radiation therapy: Secondary | ICD-10-CM | POA: Diagnosis not present

## 2024-04-14 ENCOUNTER — Emergency Department (HOSPITAL_COMMUNITY)

## 2024-04-14 ENCOUNTER — Other Ambulatory Visit: Payer: Self-pay

## 2024-04-14 ENCOUNTER — Encounter (HOSPITAL_COMMUNITY): Payer: Self-pay

## 2024-04-14 ENCOUNTER — Emergency Department (HOSPITAL_COMMUNITY)
Admission: EM | Admit: 2024-04-14 | Discharge: 2024-04-14 | Disposition: A | Discharge status: Home / Self Care | Source: Home / Self Care | Attending: Emergency Medicine | Admitting: Emergency Medicine

## 2024-04-14 ENCOUNTER — Ambulatory Visit
Admission: RE | Admit: 2024-04-14 | Discharge: 2024-04-14 | Disposition: A | Source: Ambulatory Visit | Attending: Radiation Oncology | Admitting: Radiation Oncology

## 2024-04-14 DIAGNOSIS — Z87891 Personal history of nicotine dependence: Secondary | ICD-10-CM | POA: Insufficient documentation

## 2024-04-14 DIAGNOSIS — Z96619 Presence of unspecified artificial shoulder joint: Secondary | ICD-10-CM | POA: Insufficient documentation

## 2024-04-14 DIAGNOSIS — Z510 Encounter for antineoplastic radiation therapy: Secondary | ICD-10-CM | POA: Diagnosis not present

## 2024-04-14 DIAGNOSIS — I2510 Atherosclerotic heart disease of native coronary artery without angina pectoris: Secondary | ICD-10-CM | POA: Insufficient documentation

## 2024-04-14 DIAGNOSIS — I10 Essential (primary) hypertension: Secondary | ICD-10-CM | POA: Insufficient documentation

## 2024-04-14 DIAGNOSIS — Z7982 Long term (current) use of aspirin: Secondary | ICD-10-CM | POA: Insufficient documentation

## 2024-04-14 DIAGNOSIS — K5900 Constipation, unspecified: Secondary | ICD-10-CM | POA: Diagnosis not present

## 2024-04-14 DIAGNOSIS — R109 Unspecified abdominal pain: Secondary | ICD-10-CM | POA: Diagnosis not present

## 2024-04-14 LAB — CBC WITH DIFFERENTIAL/PLATELET
Abs Immature Granulocytes: 0.01 K/uL (ref 0.00–0.07)
Basophils Absolute: 0 K/uL (ref 0.0–0.1)
Basophils Relative: 1 %
Eosinophils Absolute: 0.1 K/uL (ref 0.0–0.5)
Eosinophils Relative: 2 %
HCT: 39.3 % (ref 39.0–52.0)
Hemoglobin: 12.8 g/dL — ABNORMAL LOW (ref 13.0–17.0)
Immature Granulocytes: 0 %
Lymphocytes Relative: 27 %
Lymphs Abs: 1.9 K/uL (ref 0.7–4.0)
MCH: 29.6 pg (ref 26.0–34.0)
MCHC: 32.6 g/dL (ref 30.0–36.0)
MCV: 90.8 fL (ref 80.0–100.0)
Monocytes Absolute: 0.2 K/uL (ref 0.1–1.0)
Monocytes Relative: 3 %
Neutro Abs: 4.8 K/uL (ref 1.7–7.7)
Neutrophils Relative %: 67 %
Platelets: 149 K/uL — ABNORMAL LOW (ref 150–400)
RBC: 4.33 MIL/uL (ref 4.22–5.81)
RDW: 13.6 % (ref 11.5–15.5)
WBC: 7.1 K/uL (ref 4.0–10.5)
nRBC: 0 % (ref 0.0–0.2)

## 2024-04-14 LAB — RAD ONC ARIA SESSION SUMMARY
Course Elapsed Days: 0
Plan Fractions Treated to Date: 1
Plan Prescribed Dose Per Fraction: 2 Gy
Plan Total Fractions Prescribed: 30
Plan Total Prescribed Dose: 60 Gy
Reference Point Dosage Given to Date: 2 Gy
Reference Point Session Dosage Given: 2 Gy
Session Number: 1

## 2024-04-14 LAB — BASIC METABOLIC PANEL WITH GFR
Anion gap: 9 (ref 5–15)
BUN: 26 mg/dL — ABNORMAL HIGH (ref 8–23)
CO2: 23 mmol/L (ref 22–32)
Calcium: 8.9 mg/dL (ref 8.9–10.3)
Chloride: 104 mmol/L (ref 98–111)
Creatinine, Ser: 0.8 mg/dL (ref 0.61–1.24)
GFR, Estimated: >60 mL/min (ref >60)
Glucose, Bld: 88 mg/dL (ref 70–99)
Potassium: 4.6 mmol/L (ref 3.5–5.1)
Sodium: 136 mmol/L (ref 135–145)

## 2024-04-14 MED ORDER — POLYETHYLENE GLYCOL 3350 17 GM/SCOOP PO POWD: 17.0000 g | Freq: Every day | ORAL | 0 refills | Status: AC

## 2024-04-14 MED ORDER — SODIUM CHLORIDE 0.9 % IV BOLUS
1000.0000 mL | Freq: Once | INTRAVENOUS | Status: AC
  Administered 2024-04-14: 1000 mL via INTRAVENOUS

## 2024-04-14 MED ORDER — DOCUSATE SODIUM 100 MG PO CAPS: 100.0000 mg | ORAL_CAPSULE | Freq: Two times a day (BID) | ORAL | 0 refills | Status: AC

## 2024-04-14 MED ORDER — FLEET ENEMA RE ENEM
1.0000 | ENEMA | Freq: Once | RECTAL | Status: AC
  Administered 2024-04-14: 1 via RECTAL
  Filled 2024-04-14: qty 1

## 2024-04-14 NOTE — ED Provider Notes (Signed)
 [place] EMERGENCY DEPARTMENT AT [facility] Provider Note  CSN: [id number] Arrival date & time: 04/14/24 1811  Chief Complaint(s) Constipation  HPI Charles Grimes is a 68 y.o. male who is here today for constipation.  Patient recently started chemotherapy for adenocarcinoma of the lung.  He states since starting his chemotherapy 6 days ago, he has not been able to have much of a bowel movement.  He endorses passing 2 very small pieces of stool.  He is having abdominal pain.  He denies any fever or chills.  He started radiation today.   Past Medical History Past Medical History:  Diagnosis Date   Anginal pain    Anxiety    Arthritis    Coronary artery disease, non-occlusive 08/2013   Mild to moderate (40% OM1) single vessel CAD. Otherwise nonobstructed.   Depression    Dyslipidemia, goal LDL below 100    Essential hypertension    Family history of premature CAD    GERD (gastroesophageal reflux disease)    H/O skin disorder    Involving hands. Subsequently resolved.    Nonischemic cardiomyopathy (HCC) 05/2013   Non-ischemic: EF ~45% by Echo & Myoview  --> Non-obstructive CAD   Obesity (BMI 30.0-34.9)    OSA (obstructive sleep apnea) 06/21/2017   uses CPAP   Sleep apnea    Patient Active Problem List   Diagnosis Date Noted   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) 03/26/2024   Mediastinal adenopathy 02/19/2024   Lung mass 02/12/2024   Primary osteoarthritis of right knee 11/12/2023   Chronic pain of both shoulders 07/10/2023   Gastroesophageal reflux disease 06/04/2023   Abdominal pain 05/17/2023   GAD (generalized anxiety disorder) 08/16/2021   Recurrent major depressive disorder, in full remission 08/16/2021   Leukocytosis 03/18/2021   Hypocalcemia 03/18/2021   Pancreatitis 03/17/2021   Centrilobular emphysema (HCC) 02/08/2021   Tobacco abuse 02/25/2018   Lateral epicondylitis of left elbow 02/25/2018   OSA (obstructive sleep apnea)  06/21/2017   History of colon polyps 12/29/2016   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   HNP (herniated nucleus pulposus), lumbar 10/07/2014   Prostate cancer screening 06/10/2014   Visit for preventive health examination 06/10/2014   Fall at home 05/16/2014   Anxiety and depression 01/11/2014   Colon cancer screening 01/11/2014   Former heavy cigarette smoker (20-39 per day) 09/18/2013   Coronary artery disease, non-occlusive 08/17/2013   Essential hypertension 07/28/2013   Dyslipidemia 07/28/2013   Family history of premature CAD    Family history of factor V Leiden mutation 06/17/2013   Obesity (BMI 30-39.9) 05/25/2013   Shortness of breath on exertion 05/23/2013   Intermittent claudication 05/23/2013   Nonischemic cardiomyopathy - mild 05/17/2013   Home Medication(s) Prior to Admission medications   Medication Sig Start Date End Date Taking? Authorizing Provider  docusate sodium  (COLACE) 100 MG capsule Take 1 capsule (100 mg total) by mouth every 12 (twelve) hours. 04/14/24  Yes Mannie Pac T, DO  polyethylene glycol powder (MIRALAX ) 17 GM/SCOOP powder Take 17 g by mouth daily. Dissolve 1 capful (17g) in 4-8 ounces of liquid and take by mouth daily. 04/14/24  Yes Mannie Pac T, DO  albuterol  (VENTOLIN  HFA) 108 (90 Base) MCG/ACT inhaler Inhale 2 puffs into the lungs every 6 (six) hours as needed for wheezing or shortness of breath. 02/25/24   Ruthell Lauraine FALCON, NP  ALPRAZolam  (XANAX ) 0.5 MG tablet Take 1 tablet (0.5 mg total) by mouth at bedtime as needed for anxiety.  10/24/23   [name], DO  aspirin  EC 81 MG tablet Take 81 mg by mouth daily.    [provider]  atorvastatin  (LIPITOR) 10 MG tablet Take 1 tablet (10 mg total) by mouth daily. 10/24/23   [name], DO  budesonide-glycopyrrolate -formoterol (BREZTRI  AEROSPHERE) 160-9-4.8 MCG/ACT AERO inhaler Inhale 2 puffs into the lungs in the morning and at bedtime. 02/25/24   Ruthell Lauraine FALCON, NP   budesonide-glycopyrrolate -formoterol (BREZTRI  AEROSPHERE) 160-9-4.8 MCG/ACT AERO inhaler Inhale 2 puffs into the lungs in the morning and at bedtime. 02/25/24   Ruthell Lauraine FALCON, NP  clotrimazole  (MYCELEX ) 10 MG troche Take 1 tablet (10 mg total) by mouth 3 (three) times daily. 02/25/24   Ruthell Lauraine FALCON, NP  colchicine  0.6 MG tablet Take 1 tablet (0.6 mg total) by mouth daily as needed (gout flares). 02/19/24   Shelah Lamar RAMAN, MD  lidocaine -prilocaine  (EMLA ) cream Apply to affected area once 03/26/24   Sherrod Sherrod, MD  lisinopril  (ZESTRIL ) 10 MG tablet Take 1 tablet (10 mg total) by mouth daily. 01/14/24   [name], DO  Multiple Vitamins-Minerals (CENTRUM SILVER 50+MEN) TABS Take 1 tablet by mouth daily.    [provider]  nitroGLYCERIN  (NITROSTAT ) 0.4 MG SL tablet Place 1 tablet (0.4 mg total) under the tongue every 5 (five) minutes as needed for chest pain. 02/08/21   [name], DO  NON FORMULARY Pt uses a c-pap nightly    [provider]  omeprazole  (PRILOSEC ) 20 MG capsule Take 1 capsule (20 mg total) by mouth daily. 01/14/24   [name], DO  ondansetron  (ZOFRAN ) 8 MG tablet Take 1 tablet (8 mg total) by mouth every 8 (eight) hours as needed for nausea or vomiting. Start on the third day after chemotherapy. 03/26/24   Sherrod Sherrod, MD  prochlorperazine  (COMPAZINE ) 10 MG tablet Take 1 tablet (10 mg total) by mouth every 6 (six) hours as needed for nausea or vomiting. 03/26/24   Sherrod Sherrod, MD  sertraline  (ZOLOFT ) 50 MG tablet Take 2 tabs in the morning and 1 tab in the evening. 01/14/24   [name], DO  tiZANidine  (ZANAFLEX ) 4 MG tablet Take 1 tablet (4 mg total) by mouth at bedtime as needed for muscle spasms. 01/14/24   [name], DO  triamcinolone  cream (KENALOG ) 0.1 % Apply 1 Application topically 2 (two) times daily as needed (itch). 11/12/23   [name], DO                                                                                                                                     Past Surgical History Past Surgical History:  Procedure Laterality Date   COLONOSCOPY  2015   COLONOSCOPY WITH PROPOFOL   01/02/2017   Dr.Pyrtle   IR IMAGING GUIDED PORT INSERTION  04/02/2024   LEFT HEART CATHETERIZATION WITH CORONARY ANGIOGRAM  08/26/2013   Mild to  moderate disease with 40-50% stenosis and OM1. Otherwise no significant CAD --  Surgeon: Alm LELON Clay, MD;  Location: [facility] CATH LAB;  Service: Cardiovascular;;   Lower extremity arterial Dopplers  06/11/2013   No occlusive disease   LUMBAR LAMINECTOMY/DECOMPRESSION MICRODISCECTOMY Right 10/07/2014   Procedure: LUMBAR LAMINECTOMY/DECOMPRESSION MICRODISCECTOMY 1 LEVEL;  Surgeon: Arley Helling, MD;  Location: [facility] NEURO ORS;  Service: Neurosurgery;  Laterality: Right;  Right L3-L4 Microdiscectomy   NM MYOVIEW  LTD  06/03/2013   Low risk study with no ischemia. EF roughly 44% with no regional wall motion abnormalities noted   PFTs  06/16/2013   Normal Volumes &  Spirometry; Moderately reduced DLCO   POLYPECTOMY     SHOULDER HEMI-ARTHROPLASTY Right 05/28/2015   Procedure: RIGHT SHOULDER HEMI-ARTHROPLASTY CTA HEAD AND SUBSCAP REPAIR ;  Surgeon: Marcey Her, MD;  Location: [facility] OR;  Service: Orthopedics;  Laterality: Right;   TRANSTHORACIC ECHOCARDIOGRAM  06/11/2013   Mildly reduced EF: 45-50%. Mild anterior hypokinesis with incoordinate septal motion. No evidence of pulmonary hypertension   VIDEO BRONCHOSCOPY WITH ENDOBRONCHIAL ULTRASOUND Left 02/19/2024   Procedure: BRONCHOSCOPY, WITH EBUS;  Surgeon: Shelah Lamar RAMAN, MD;  Location: [facility] ENDOSCOPY;  Service: Pulmonary;  Laterality: Left;   Family History Family History  Problem Relation Age of Onset   Atrial fibrillation Mother        Alive at 60   COPD Mother    Hypertension Mother    Colonic polyp Mother    Hypertension Father        Alive at 28   Lung cancer Father        Chronic smoker    Factor V Leiden deficiency Father        Also Lupus anticoagulant   Heart attack Maternal Grandmother 48   Heart failure Paternal Grandmother    Diabetes Paternal Grandmother    Heart attack Brother 69       X2   Heart attack Sister 26       Deceased   Healthy Sister        x2   Healthy Son        x2   Colon cancer Neg Hx    Esophageal cancer Neg Hx    Rectal cancer Neg Hx    Stomach cancer Neg Hx     Social History Social History   Tobacco Use   Smoking status: Former    Current packs/day: 1.00    Average packs/day: 1 pack/day for 42.0 years (42.0 ttl pk-yrs)    Types: Cigarettes   Smokeless tobacco: Never   Tobacco comments:    3 cigarettes a day 02/12/2024 KRD    Quit x1 month ago 02/12/2024  Vaping Use   Vaping status: Former   Substances: Flavoring  Substance Use Topics   Alcohol use: Not Currently    Comment: rarely   Drug use: No   Allergies Ibuprofen, Isosorbide  nitrate, Oxycodone , and Penicillins  Review of Systems Review of Systems  Physical Exam Vital Signs  I have reviewed the triage vital signs BP 120/75   Pulse 76   Temp (!) 97.5 F (36.4 C) (Oral)   Resp 16   Wt 86.2 kg   SpO2 97%   BMI 26.50 kg/m   Physical Exam Vitals and nursing note reviewed.  HENT:     Head: Normocephalic.  Eyes:     Pupils: Pupils are equal, round, and reactive to light.  Cardiovascular:     Rate and Rhythm: Normal rate.  Pulmonary:  Effort: Pulmonary effort is normal.  Abdominal:     General: Abdomen is flat. There is no distension.     Palpations: Abdomen is soft.     Tenderness: There is no abdominal tenderness. There is no guarding.  Skin:    General: Skin is warm.  Neurological:     General: No focal deficit present.     Mental Status: He is alert.     ED Results and Treatments Labs (all labs ordered are listed, but only abnormal results are displayed) Labs Reviewed  BASIC METABOLIC PANEL WITH GFR - Abnormal; Notable for the following  components:      Result Value   BUN 26 (*)    All other components within normal limits  CBC WITH DIFFERENTIAL/PLATELET - Abnormal; Notable for the following components:   Hemoglobin 12.8 (*)    Platelets 149 (*)    All other components within normal limits                                                                                                                          Radiology CT ABDOMEN PELVIS WO CONTRAST Result Date: 04/14/2024 EXAM: CT ABDOMEN AND PELVIS WITHOUT CONTRAST 04/14/2024 07:55:09 PM TECHNIQUE: CT of the abdomen and pelvis was performed without the administration of intravenous contrast. Multiplanar reformatted images are provided for review. Automated exposure control, iterative reconstruction, and/or weight-based adjustment of the mA/kV was utilized to reduce the radiation dose to as low as reasonably achievable. COMPARISON: CT abdomen and pelvis 05/17/2023. CLINICAL HISTORY: Bowel obstruction suspected. Per triage note: Pt arrives with c/o constipation. Pt reports that he has only had a small BM in the past 6 days since starting his chemo treatment last Wednesday. Pt denies fevers or n/v. Pt reports lower ABD pain. FINDINGS: LOWER CHEST: Emphysema in the lower lungs. No acute abnormality in the lower chest. LIVER: The liver is unremarkable. GALLBLADDER AND BILE DUCTS: Gallbladder is unremarkable. No biliary ductal dilatation. SPLEEN: No acute abnormality. PANCREAS: No acute abnormality. ADRENAL GLANDS: No acute abnormality. KIDNEYS, URETERS AND BLADDER: No stones in the kidneys or ureters. No hydronephrosis. No perinephric or periureteral stranding. Urinary bladder is unremarkable. GI AND BOWEL: Stomach demonstrates no acute abnormality. There is no bowel obstruction. Normal appendix. PERITONEUM AND RETROPERITONEUM: No ascites. No free air. VASCULATURE: Aorta is normal in caliber. Aortic atherosclerotic calcification. LYMPH NODES: No lymphadenopathy. REPRODUCTIVE ORGANS: No acute  abnormality. BONES AND SOFT TISSUES: No acute osseous abnormality. No focal soft tissue abnormality. IMPRESSION: 1. No acute findings in the abdomen or pelvis. Electronically signed by: Norman Gatlin MD 04/14/2024 08:04 PM EDT RP Workstation: [id number]    Pertinent labs & imaging results that were available during my care of the patient were reviewed by me and considered in my medical decision making (see MDM for details).  Medications Ordered in ED Medications  sodium chloride  0.9 % bolus 1,000 mL (1,000 mLs Intravenous New Bag/Given 04/14/24 1926)  sodium phosphate  (FLEET) enema 1 enema (1  enema Rectal Given 04/14/24 2011)                                                                                                                                     Procedures Procedures  (including critical care time)  Medical Decision Making / ED Course   This patient presents to the ED for concern of constipation, this involves an extensive number of treatment options, and is a complaint that carries with it a high risk of complications and morbidity.  The differential diagnosis includes constipation, fecal impaction, stercoral colitis.  MDM: Patient overall well-appearing.  Will obtain imaging of the patient's abdomen and pelvis given his comorbidities to assess for other etiology.  If patient is constipated, will likely require disimpaction.  Reassessment 8:20 PM-patient CT scan did not show significant stool burden.  Will give him an enema here, plan to start patient on an appropriate bowel regimen at home.  Patient and patient's son are agreeable with that plan.  Will discharge.  Additional history obtained: -Additional history obtained from son at bedside -External records from outside source obtained and reviewed including: Chart review including previous notes, labs, imaging, consultation notes   Lab Tests: -I ordered, reviewed, and interpreted labs.   The pertinent results include:    Labs Reviewed  BASIC METABOLIC PANEL WITH GFR - Abnormal; Notable for the following components:      Result Value   BUN 26 (*)    All other components within normal limits  CBC WITH DIFFERENTIAL/PLATELET - Abnormal; Notable for the following components:   Hemoglobin 12.8 (*)    Platelets 149 (*)    All other components within normal limits      Imaging Studies ordered: I ordered imaging studies including CT abdomen pelvis I independently visualized and interpreted imaging. I agree with the radiologist interpretation   Medicines ordered and prescription drug management: Meds ordered this encounter  Medications   sodium chloride  0.9 % bolus 1,000 mL   sodium phosphate  (FLEET) enema 1 enema   polyethylene glycol powder (MIRALAX ) 17 GM/SCOOP powder    Sig: Take 17 g by mouth daily. Dissolve 1 capful (17g) in 4-8 ounces of liquid and take by mouth daily.    Dispense:  238 g    Refill:  0   docusate sodium  (COLACE) 100 MG capsule    Sig: Take 1 capsule (100 mg total) by mouth every 12 (twelve) hours.    Dispense:  60 capsule    Refill:  0    -I have reviewed the patients home medicines and have made adjustments as needed  Cardiac Monitoring: The patient was maintained on a cardiac monitor.  I personally viewed and interpreted the cardiac monitored which showed an underlying rhythm of: Normal sinus rhythm  Social Determinants of Health:  Factors impacting patients care include: Supple medical comorbidities including lung cancer, currently on chemotherapy.   Reevaluation: After the interventions noted  above, I reevaluated the patient and found that they have :improved  Co morbidities that complicate the patient evaluation  Past Medical History:  Diagnosis Date   Anginal pain    Anxiety    Arthritis    Coronary artery disease, non-occlusive 08/2013   Mild to moderate (40% OM1) single vessel CAD. Otherwise nonobstructed.   Depression    Dyslipidemia, goal LDL below 100     Essential hypertension    Family history of premature CAD    GERD (gastroesophageal reflux disease)    H/O skin disorder    Involving hands. Subsequently resolved.    Nonischemic cardiomyopathy (HCC) 05/2013   Non-ischemic: EF ~45% by Echo & Myoview  --> Non-obstructive CAD   Obesity (BMI 30.0-34.9)    OSA (obstructive sleep apnea) 06/21/2017   uses CPAP   Sleep apnea       Dispostion: I considered admission for this patient, however he is appropriate for an outpatient bowel regimen.     Final Clinical Impression(s) / ED Diagnoses Final diagnoses:  Constipation, unspecified constipation type     @PCDICTATION @    Mannie Pac T, DO 04/14/24 2026

## 2024-04-14 NOTE — Discharge Instructions (Signed)
 I have sent you prescriptions for MiraLAX  and Colace.  Starting this evening, you may take 2 capfuls of MiraLAX  each day.  You may also take Colace once in the morning once in the evening.  You may continue this regimen until you have a regular bowel movement.  Going forward, it may benefit you to take 1 cap of MiraLAX  each day, and 1 Colace each day.  Follow-up with your primary care doctor.

## 2024-04-14 NOTE — ED Triage Notes (Signed)
 Pt arrives with c/o constipation. Pt reports that he has only had a small BM in the past 6 days since starting his chemo treatment last Wednesday. Pt denies fevers or n/v. Pt reports lower ABD pain.

## 2024-04-15 ENCOUNTER — Other Ambulatory Visit: Payer: Self-pay

## 2024-04-15 ENCOUNTER — Ambulatory Visit
Admission: RE | Admit: 2024-04-15 | Discharge: 2024-04-15 | Disposition: A | Discharge status: Home / Self Care | Source: Ambulatory Visit | Attending: Radiation Oncology | Admitting: Radiation Oncology

## 2024-04-15 DIAGNOSIS — Z510 Encounter for antineoplastic radiation therapy: Secondary | ICD-10-CM | POA: Diagnosis not present

## 2024-04-15 LAB — RAD ONC ARIA SESSION SUMMARY
Course Elapsed Days: 1
Plan Fractions Treated to Date: 2
Plan Prescribed Dose Per Fraction: 2 Gy
Plan Total Fractions Prescribed: 30
Plan Total Prescribed Dose: 60 Gy
Reference Point Dosage Given to Date: 4 Gy
Reference Point Session Dosage Given: 2 Gy
Session Number: 2

## 2024-04-16 ENCOUNTER — Other Ambulatory Visit: Payer: Self-pay

## 2024-04-16 ENCOUNTER — Inpatient Hospital Stay

## 2024-04-16 ENCOUNTER — Ambulatory Visit
Admission: RE | Admit: 2024-04-16 | Discharge: 2024-04-16 | Disposition: A | Discharge status: Home / Self Care | Source: Ambulatory Visit | Attending: Radiation Oncology | Admitting: Radiation Oncology

## 2024-04-16 ENCOUNTER — Inpatient Hospital Stay (HOSPITAL_BASED_OUTPATIENT_CLINIC_OR_DEPARTMENT_OTHER): Admitting: Internal Medicine

## 2024-04-16 VITALS — BP 126/72 | HR 75 | Temp 97.0°F | Resp 17 | Ht 71.0 in | Wt 187.5 lb

## 2024-04-16 DIAGNOSIS — K208 Other esophagitis without bleeding: Secondary | ICD-10-CM | POA: Diagnosis not present

## 2024-04-16 DIAGNOSIS — Z7982 Long term (current) use of aspirin: Secondary | ICD-10-CM | POA: Diagnosis not present

## 2024-04-16 DIAGNOSIS — R110 Nausea: Secondary | ICD-10-CM | POA: Diagnosis not present

## 2024-04-16 DIAGNOSIS — Z923 Personal history of irradiation: Secondary | ICD-10-CM | POA: Insufficient documentation

## 2024-04-16 DIAGNOSIS — R042 Hemoptysis: Secondary | ICD-10-CM | POA: Insufficient documentation

## 2024-04-16 DIAGNOSIS — Z5111 Encounter for antineoplastic chemotherapy: Secondary | ICD-10-CM | POA: Diagnosis not present

## 2024-04-16 DIAGNOSIS — N400 Enlarged prostate without lower urinary tract symptoms: Secondary | ICD-10-CM | POA: Diagnosis not present

## 2024-04-16 DIAGNOSIS — I2510 Atherosclerotic heart disease of native coronary artery without angina pectoris: Secondary | ICD-10-CM | POA: Diagnosis not present

## 2024-04-16 DIAGNOSIS — D6959 Other secondary thrombocytopenia: Secondary | ICD-10-CM | POA: Diagnosis not present

## 2024-04-16 DIAGNOSIS — Y842 Radiological procedure and radiotherapy as the cause of abnormal reaction of the patient, or of later complication, without mention of misadventure at the time of the procedure: Secondary | ICD-10-CM | POA: Diagnosis not present

## 2024-04-16 DIAGNOSIS — R918 Other nonspecific abnormal finding of lung field: Secondary | ICD-10-CM | POA: Diagnosis not present

## 2024-04-16 DIAGNOSIS — T451X5A Adverse effect of antineoplastic and immunosuppressive drugs, initial encounter: Secondary | ICD-10-CM | POA: Insufficient documentation

## 2024-04-16 DIAGNOSIS — I959 Hypotension, unspecified: Secondary | ICD-10-CM | POA: Diagnosis not present

## 2024-04-16 DIAGNOSIS — E861 Hypovolemia: Secondary | ICD-10-CM | POA: Insufficient documentation

## 2024-04-16 DIAGNOSIS — M542 Cervicalgia: Secondary | ICD-10-CM | POA: Diagnosis not present

## 2024-04-16 DIAGNOSIS — Z510 Encounter for antineoplastic radiation therapy: Secondary | ICD-10-CM | POA: Diagnosis not present

## 2024-04-16 DIAGNOSIS — R0600 Dyspnea, unspecified: Secondary | ICD-10-CM | POA: Diagnosis not present

## 2024-04-16 DIAGNOSIS — I10 Essential (primary) hypertension: Secondary | ICD-10-CM | POA: Diagnosis not present

## 2024-04-16 DIAGNOSIS — R0602 Shortness of breath: Secondary | ICD-10-CM | POA: Diagnosis not present

## 2024-04-16 DIAGNOSIS — C3412 Malignant neoplasm of upper lobe, left bronchus or lung: Secondary | ICD-10-CM | POA: Insufficient documentation

## 2024-04-16 DIAGNOSIS — R1032 Left lower quadrant pain: Secondary | ICD-10-CM | POA: Diagnosis not present

## 2024-04-16 DIAGNOSIS — R5383 Other fatigue: Secondary | ICD-10-CM | POA: Diagnosis not present

## 2024-04-16 DIAGNOSIS — Z79899 Other long term (current) drug therapy: Secondary | ICD-10-CM | POA: Insufficient documentation

## 2024-04-16 DIAGNOSIS — K760 Fatty (change of) liver, not elsewhere classified: Secondary | ICD-10-CM | POA: Diagnosis not present

## 2024-04-16 DIAGNOSIS — R071 Chest pain on breathing: Secondary | ICD-10-CM | POA: Diagnosis not present

## 2024-04-16 DIAGNOSIS — J439 Emphysema, unspecified: Secondary | ICD-10-CM | POA: Diagnosis not present

## 2024-04-16 DIAGNOSIS — R519 Headache, unspecified: Secondary | ICD-10-CM | POA: Diagnosis not present

## 2024-04-16 DIAGNOSIS — R0789 Other chest pain: Secondary | ICD-10-CM | POA: Diagnosis not present

## 2024-04-16 LAB — CMP (CANCER CENTER ONLY)
ALT: 18 U/L (ref 0–44)
AST: 19 U/L (ref 15–41)
Albumin: 3.8 g/dL (ref 3.5–5.0)
Alkaline Phosphatase: 112 U/L (ref 38–126)
Anion gap: 4 — ABNORMAL LOW (ref 5–15)
BUN: 25 mg/dL — ABNORMAL HIGH (ref 8–23)
CO2: 28 mmol/L (ref 22–32)
Calcium: 8.8 mg/dL — ABNORMAL LOW (ref 8.9–10.3)
Chloride: 105 mmol/L (ref 98–111)
Creatinine: 0.94 mg/dL (ref 0.61–1.24)
GFR, Estimated: >60 mL/min (ref >60)
Glucose, Bld: 97 mg/dL (ref 70–99)
Potassium: 4.5 mmol/L (ref 3.5–5.1)
Sodium: 137 mmol/L (ref 135–145)
Total Bilirubin: 0.6 mg/dL (ref 0.0–1.2)
Total Protein: 7.1 g/dL (ref 6.5–8.1)

## 2024-04-16 LAB — CBC WITH DIFFERENTIAL (CANCER CENTER ONLY)
Abs Immature Granulocytes: 0.04 K/uL (ref 0.00–0.07)
Basophils Absolute: 0.1 K/uL (ref 0.0–0.1)
Basophils Relative: 1 %
Eosinophils Absolute: 0.2 K/uL (ref 0.0–0.5)
Eosinophils Relative: 2 %
HCT: 40.1 % (ref 39.0–52.0)
Hemoglobin: 13.5 g/dL (ref 13.0–17.0)
Immature Granulocytes: 1 %
Lymphocytes Relative: 22 %
Lymphs Abs: 1.8 K/uL (ref 0.7–4.0)
MCH: 29.4 pg (ref 26.0–34.0)
MCHC: 33.7 g/dL (ref 30.0–36.0)
MCV: 87.4 fL (ref 80.0–100.0)
Monocytes Absolute: 0.4 K/uL (ref 0.1–1.0)
Monocytes Relative: 4 %
Neutro Abs: 5.7 K/uL (ref 1.7–7.7)
Neutrophils Relative %: 70 %
Platelet Count: 184 K/uL (ref 150–400)
RBC: 4.59 MIL/uL (ref 4.22–5.81)
RDW: 13.8 % (ref 11.5–15.5)
WBC Count: 8.1 K/uL (ref 4.0–10.5)
nRBC: 0 % (ref 0.0–0.2)

## 2024-04-16 LAB — RAD ONC ARIA SESSION SUMMARY
Course Elapsed Days: 2
Plan Fractions Treated to Date: 3
Plan Prescribed Dose Per Fraction: 2 Gy
Plan Total Fractions Prescribed: 30
Plan Total Prescribed Dose: 60 Gy
Reference Point Dosage Given to Date: 6 Gy
Reference Point Session Dosage Given: 2 Gy
Session Number: 3

## 2024-04-16 MED ORDER — PALONOSETRON HCL INJECTION 0.25 MG/5ML
0.2500 mg | Freq: Once | INTRAVENOUS | Status: AC
  Administered 2024-04-16: 0.25 mg via INTRAVENOUS
  Filled 2024-04-16: qty 5

## 2024-04-16 MED ORDER — DIPHENHYDRAMINE HCL 50 MG/ML IJ SOLN
50.0000 mg | Freq: Once | INTRAMUSCULAR | Status: AC
  Administered 2024-04-16: 50 mg via INTRAVENOUS
  Filled 2024-04-16: qty 1

## 2024-04-16 MED ORDER — SODIUM CHLORIDE 0.9% FLUSH
10.0000 mL | INTRAVENOUS | Status: DC | PRN
  Administered 2024-04-16: 10 mL

## 2024-04-16 MED ORDER — DEXAMETHASONE SODIUM PHOSPHATE 10 MG/ML IJ SOLN
10.0000 mg | Freq: Once | INTRAMUSCULAR | Status: AC
  Administered 2024-04-16: 10 mg via INTRAVENOUS
  Filled 2024-04-16: qty 1

## 2024-04-16 MED ORDER — FAMOTIDINE IN NACL 20-0.9 MG/50ML-% IV SOLN
20.0000 mg | Freq: Once | INTRAVENOUS | Status: AC
  Administered 2024-04-16: 20 mg via INTRAVENOUS
  Filled 2024-04-16: qty 50

## 2024-04-16 MED ORDER — SODIUM CHLORIDE 0.9 % IV SOLN: INTRAVENOUS | Status: DC

## 2024-04-16 MED ORDER — SODIUM CHLORIDE 0.9 % IV SOLN
45.0000 mg/m2 | Freq: Once | INTRAVENOUS | Status: AC
  Administered 2024-04-16: 96 mg via INTRAVENOUS
  Filled 2024-04-16: qty 16

## 2024-04-16 MED ORDER — SODIUM CHLORIDE 0.9 % IV SOLN
216.2000 mg | Freq: Once | INTRAVENOUS | Status: AC
  Administered 2024-04-16: 220 mg via INTRAVENOUS
  Filled 2024-04-16: qty 22

## 2024-04-16 NOTE — Patient Instructions (Addendum)
 [facility] - A DEPT OF [facility]  Discharge Instructions: Thank you for choosing [facility] to provide your oncology and hematology care.   If you have a lab appointment with the Cancer Center, please go directly to the Cancer Center and check in at the registration area.   Wear comfortable clothing and clothing appropriate for easy access to any Portacath or PICC line.   We strive to give you quality time with your provider. You may need to reschedule your appointment if you arrive late (15 or more minutes).  Arriving late affects you and other patients whose appointments are after yours.  Also, if you miss three or more appointments without notifying the office, you may be dismissed from the clinic at the provider's discretion.      For prescription refill requests, have your pharmacy contact our office and allow 72 hours for refills to be completed.    Today you received the following chemotherapy and/or immunotherapy agents: Paclitaxel , Carboplatin .       To help prevent nausea and vomiting after your treatment, we encourage you to take your nausea medication as directed.  BELOW ARE SYMPTOMS THAT SHOULD BE REPORTED IMMEDIATELY: *FEVER GREATER THAN 100.4 F (38 C) OR HIGHER *CHILLS OR SWEATING *NAUSEA AND VOMITING THAT IS NOT CONTROLLED WITH YOUR NAUSEA MEDICATION *UNUSUAL SHORTNESS OF BREATH *UNUSUAL BRUISING OR BLEEDING *URINARY PROBLEMS (pain or burning when urinating, or frequent urination) *BOWEL PROBLEMS (unusual diarrhea, constipation, pain near the anus) TENDERNESS IN MOUTH AND THROAT WITH OR WITHOUT PRESENCE OF ULCERS (sore throat, sores in mouth, or a toothache) UNUSUAL RASH, SWELLING OR PAIN  UNUSUAL VAGINAL DISCHARGE OR ITCHING   Items with * indicate a potential emergency and should be followed up as soon as possible or go to the Emergency Department if any problems should occur.  Please show the CHEMOTHERAPY ALERT CARD  or IMMUNOTHERAPY ALERT CARD at check-in to the Emergency Department and triage nurse.  Should you have questions after your visit or need to cancel or reschedule your appointment, please contact [facility] - A DEPT OF [facility]  Dept: [phone]  and follow the prompts.  Office hours are 8:00 a.m. to 4:30 p.m. Monday - Friday. Please note that voicemails left after 4:00 p.m. may not be returned until the following business day.  We are closed weekends and major holidays. You have access to a nurse at all times for urgent questions. Please call the main number to the clinic Dept: [phone] and follow the prompts.   For any non-urgent questions, you may also contact your provider using MyChart. We now offer e-Visits for anyone 64 and older to request care online for non-urgent symptoms. For details visit [email or web address].   Also download the MyChart app! Go to the app store, search MyChart, open the app, select [employer], and log in with your MyChart username and password.

## 2024-04-16 NOTE — Progress Notes (Signed)
 Request for tissue from lung biopsy [id number] be sent to [facility] for molecular studies emailed to [facility] path techs Tonya Pendell and Cydney Frazier

## 2024-04-16 NOTE — Progress Notes (Signed)
 [facility] Telephone:([phone]) [phone]   Fax:(336) [phone]  OFFICE PROGRESS NOTE  [name], DO [address] [place] [address]  DIAGNOSIS: Stage IIIa (T3, N2, M0) non-small cell lung cancer, adenocarcinoma presented with left suprahilar mass in addition to suspicious subcarinal lymphadenopathy diagnosed in August 2025.   PRIOR THERAPY: None  CURRENT THERAPY: A course of concurrent chemoradiation with weekly carboplatin  for AUC 2 2 and paclitaxel  45 MGs/M2.  Status post 1 cycle.    INTERVAL HISTORY: Charles Grimes 68 y.o. male returns to the clinic today for follow-up visit accompanied by his son. Discussed the use of AI scribe software for clinical note transcription with the patient, who gave verbal consent to proceed.  History of Present Illness Charles Grimes is a 68 year old male with stage three non-small cell lung cancer, adenocarcinoma, who presents for evaluation before starting cycle two of chemoradiation. He is accompanied by his son.  He was diagnosed with stage three non-small cell lung cancer, adenocarcinoma, in August 2025 and is currently undergoing concurrent chemoradiation with weekly carboplatin  and paclitaxel . He has completed one cycle of treatment.  He feels 'pretty good' overall but experienced nausea approximately two to three days after the first treatment cycle, which was relieved by medication. He has noticed a weight loss of about two to three pounds, with his current weight being 187 pounds.  No chest pain, breathing issues, or diarrhea. He reports occasional hemoptysis.   MEDICAL HISTORY: Past Medical History:  Diagnosis Date   Anginal pain    Anxiety    Arthritis    Coronary artery disease, non-occlusive 08/2013   Mild to moderate (40% OM1) single vessel CAD. Otherwise nonobstructed.   Depression    Dyslipidemia, goal LDL below 100    Essential hypertension    Family history of premature  CAD    GERD (gastroesophageal reflux disease)    H/O skin disorder    Involving hands. Subsequently resolved.    Nonischemic cardiomyopathy (HCC) 05/2013   Non-ischemic: EF ~45% by Echo & Myoview  --> Non-obstructive CAD   Obesity (BMI 30.0-34.9)    OSA (obstructive sleep apnea) 06/21/2017   uses CPAP   Sleep apnea     ALLERGIES:  is allergic to ibuprofen, isosorbide  nitrate, oxycodone , and penicillins.  MEDICATIONS:  Current Outpatient Medications  Medication Sig Dispense Refill   albuterol  (VENTOLIN  HFA) 108 (90 Base) MCG/ACT inhaler Inhale 2 puffs into the lungs every 6 (six) hours as needed for wheezing or shortness of breath. 8 g 2   ALPRAZolam  (XANAX ) 0.5 MG tablet Take 1 tablet (0.5 mg total) by mouth at bedtime as needed for anxiety. 30 tablet 0   aspirin  EC 81 MG tablet Take 81 mg by mouth daily.     atorvastatin  (LIPITOR) 10 MG tablet Take 1 tablet (10 mg total) by mouth daily. 90 tablet 3   budesonide-glycopyrrolate -formoterol (BREZTRI  AEROSPHERE) 160-9-4.8 MCG/ACT AERO inhaler Inhale 2 puffs into the lungs in the morning and at bedtime. 10.7 each 6   budesonide-glycopyrrolate -formoterol (BREZTRI  AEROSPHERE) 160-9-4.8 MCG/ACT AERO inhaler Inhale 2 puffs into the lungs in the morning and at bedtime.     clotrimazole  (MYCELEX ) 10 MG troche Take 1 tablet (10 mg total) by mouth 3 (three) times daily. 21 Troche 0   colchicine  0.6 MG tablet Take 1 tablet (0.6 mg total) by mouth daily as needed (gout flares).     docusate sodium  (COLACE) 100 MG capsule Take  1 capsule (100 mg total) by mouth every 12 (twelve) hours. 60 capsule 0   lidocaine -prilocaine  (EMLA ) cream Apply to affected area once 30 g 3   lisinopril  (ZESTRIL ) 10 MG tablet Take 1 tablet (10 mg total) by mouth daily. 90 tablet 3   Multiple Vitamins-Minerals (CENTRUM SILVER 50+MEN) TABS Take 1 tablet by mouth daily.     nitroGLYCERIN  (NITROSTAT ) 0.4 MG SL tablet Place 1 tablet (0.4 mg total) under the tongue every 5 (five)  minutes as needed for chest pain. 30 tablet 1   NON FORMULARY Pt uses a c-pap nightly     omeprazole  (PRILOSEC ) 20 MG capsule Take 1 capsule (20 mg total) by mouth daily. 90 capsule 3   ondansetron  (ZOFRAN ) 8 MG tablet Take 1 tablet (8 mg total) by mouth every 8 (eight) hours as needed for nausea or vomiting. Start on the third day after chemotherapy. 30 tablet 1   polyethylene glycol powder (MIRALAX ) 17 GM/SCOOP powder Take 17 g by mouth daily. Dissolve 1 capful (17g) in 4-8 ounces of liquid and take by mouth daily. 238 g 0   prochlorperazine  (COMPAZINE ) 10 MG tablet Take 1 tablet (10 mg total) by mouth every 6 (six) hours as needed for nausea or vomiting. 30 tablet 1   sertraline  (ZOLOFT ) 50 MG tablet Take 2 tabs in the morning and 1 tab in the evening. 270 tablet 3   tiZANidine  (ZANAFLEX ) 4 MG tablet Take 1 tablet (4 mg total) by mouth at bedtime as needed for muscle spasms. 30 tablet 2   triamcinolone  cream (KENALOG ) 0.1 % Apply 1 Application topically 2 (two) times daily as needed (itch). 30 g 1   No current facility-administered medications for this visit.    SURGICAL HISTORY:  Past Surgical History:  Procedure Laterality Date   COLONOSCOPY  2015   COLONOSCOPY WITH PROPOFOL   01/02/2017   Dr.Pyrtle   IR IMAGING GUIDED PORT INSERTION  04/02/2024   LEFT HEART CATHETERIZATION WITH CORONARY ANGIOGRAM  08/26/2013   Mild to moderate disease with 40-50% stenosis and OM1. Otherwise no significant CAD --  Surgeon: Alm LELON Clay, MD;  Location: [facility] CATH LAB;  Service: Cardiovascular;;   Lower extremity arterial Dopplers  06/11/2013   No occlusive disease   LUMBAR LAMINECTOMY/DECOMPRESSION MICRODISCECTOMY Right 10/07/2014   Procedure: LUMBAR LAMINECTOMY/DECOMPRESSION MICRODISCECTOMY 1 LEVEL;  Surgeon: Arley Helling, MD;  Location: [facility] NEURO ORS;  Service: Neurosurgery;  Laterality: Right;  Right L3-L4 Microdiscectomy   NM MYOVIEW  LTD  06/03/2013   Low risk study with no ischemia. EF roughly 44% with  no regional wall motion abnormalities noted   PFTs  06/16/2013   Normal Volumes &  Spirometry; Moderately reduced DLCO   POLYPECTOMY     SHOULDER HEMI-ARTHROPLASTY Right 05/28/2015   Procedure: RIGHT SHOULDER HEMI-ARTHROPLASTY CTA HEAD AND SUBSCAP REPAIR ;  Surgeon: Marcey Her, MD;  Location: [facility] OR;  Service: Orthopedics;  Laterality: Right;   TRANSTHORACIC ECHOCARDIOGRAM  06/11/2013   Mildly reduced EF: 45-50%. Mild anterior hypokinesis with incoordinate septal motion. No evidence of pulmonary hypertension   VIDEO BRONCHOSCOPY WITH ENDOBRONCHIAL ULTRASOUND Left 02/19/2024   Procedure: BRONCHOSCOPY, WITH EBUS;  Surgeon: Shelah Lamar RAMAN, MD;  Location: [facility] ENDOSCOPY;  Service: Pulmonary;  Laterality: Left;    REVIEW OF SYSTEMS:  Constitutional: positive for fatigue Eyes: negative Ears, nose, mouth, throat, and face: negative Respiratory: positive for cough Cardiovascular: negative Gastrointestinal: positive for nausea Genitourinary:negative Integument/breast: negative Hematologic/lymphatic: negative Musculoskeletal:negative Neurological: negative Behavioral/Psych: negative Endocrine: negative Allergic/Immunologic: negative   PHYSICAL  EXAMINATION: General appearance: alert, cooperative, fatigued, and no distress Head: Normocephalic, without obvious abnormality, atraumatic Neck: no adenopathy, no JVD, supple, symmetrical, trachea midline, and thyroid not enlarged, symmetric, no tenderness/mass/nodules Lymph nodes: Cervical, supraclavicular, and axillary nodes normal. Resp: clear to auscultation bilaterally Back: symmetric, no curvature. ROM normal. No CVA tenderness. Cardio: regular rate and rhythm, S1, S2 normal, no murmur, click, rub or gallop GI: soft, non-tender; bowel sounds normal; no masses,  no organomegaly Extremities: extremities normal, atraumatic, no cyanosis or edema Neurologic: Alert and oriented X 3, normal strength and tone. Normal symmetric reflexes. Normal  coordination and gait  ECOG PERFORMANCE STATUS: 1 - Symptomatic but completely ambulatory  Blood pressure 126/72, pulse 75, temperature (!) 97 F (36.1 C), resp. rate 17, height 5' 11 (1.803 m), weight 187 lb 8 oz (85 kg), SpO2 98%.  LABORATORY DATA: Lab Results  Component Value Date   WBC 8.1 04/16/2024   HGB 13.5 04/16/2024   HCT 40.1 04/16/2024   MCV 87.4 04/16/2024   PLT 184 04/16/2024      Chemistry      Component Value Date/Time   NA 137 04/16/2024 1004   K 4.5 04/16/2024 1004   CL 105 04/16/2024 1004   CO2 28 04/16/2024 1004   BUN 25 (H) 04/16/2024 1004   CREATININE 0.94 04/16/2024 1004   CREATININE 0.84 04/30/2015 1738      Component Value Date/Time   CALCIUM  8.8 (L) 04/16/2024 1004   ALKPHOS 112 04/16/2024 1004   AST 19 04/16/2024 1004   ALT 18 04/16/2024 1004   BILITOT 0.6 04/16/2024 1004       RADIOGRAPHIC STUDIES: CT ABDOMEN PELVIS WO CONTRAST Result Date: 04/14/2024 EXAM: CT ABDOMEN AND PELVIS WITHOUT CONTRAST 04/14/2024 07:55:09 PM TECHNIQUE: CT of the abdomen and pelvis was performed without the administration of intravenous contrast. Multiplanar reformatted images are provided for review. Automated exposure control, iterative reconstruction, and/or weight-based adjustment of the mA/kV was utilized to reduce the radiation dose to as low as reasonably achievable. COMPARISON: CT abdomen and pelvis 05/17/2023. CLINICAL HISTORY: Bowel obstruction suspected. Per triage note: Pt arrives with c/o constipation. Pt reports that he has only had a small BM in the past 6 days since starting his chemo treatment last Wednesday. Pt denies fevers or n/v. Pt reports lower ABD pain. FINDINGS: LOWER CHEST: Emphysema in the lower lungs. No acute abnormality in the lower chest. LIVER: The liver is unremarkable. GALLBLADDER AND BILE DUCTS: Gallbladder is unremarkable. No biliary ductal dilatation. SPLEEN: No acute abnormality. PANCREAS: No acute abnormality. ADRENAL GLANDS: No acute  abnormality. KIDNEYS, URETERS AND BLADDER: No stones in the kidneys or ureters. No hydronephrosis. No perinephric or periureteral stranding. Urinary bladder is unremarkable. GI AND BOWEL: Stomach demonstrates no acute abnormality. There is no bowel obstruction. Normal appendix. PERITONEUM AND RETROPERITONEUM: No ascites. No free air. VASCULATURE: Aorta is normal in caliber. Aortic atherosclerotic calcification. LYMPH NODES: No lymphadenopathy. REPRODUCTIVE ORGANS: No acute abnormality. BONES AND SOFT TISSUES: No acute osseous abnormality. No focal soft tissue abnormality. IMPRESSION: 1. No acute findings in the abdomen or pelvis. Electronically signed by: Norman Gatlin MD 04/14/2024 08:04 PM EDT RP Workstation: [id number]   IR IMAGING GUIDED PORT INSERTION Result Date: 04/02/2024 INDICATION: 68 year old male with history of lung cancer requiring central venous access for chemotherapy administration. EXAM: IMPLANTED PORT A CATH PLACEMENT WITH ULTRASOUND AND FLUOROSCOPIC GUIDANCE COMPARISON:  None Available. MEDICATIONS: None. ANESTHESIA/SEDATION: Moderate (conscious) sedation was employed during this procedure. A total of Versed  2 mg and Fentanyl  100  mcg was administered intravenously. Moderate Sedation Time: 15 minutes. The patient's level of consciousness and vital signs were monitored continuously by radiology nursing throughout the procedure under my direct supervision. CONTRAST:  None FLUOROSCOPY TIME:  2.4 mGy reference air kerma COMPLICATIONS: None immediate. PROCEDURE: The procedure, risks, benefits, and alternatives were explained to the patient. Questions regarding the procedure were encouraged and answered. The patient understands and consents to the procedure. The right neck and chest were prepped with chlorhexidine  in a sterile fashion, and a sterile drape was applied covering the operative field. Maximum barrier sterile technique with sterile gowns and gloves were used for the procedure. A timeout  was performed prior to the initiation of the procedure. Ultrasound was used to examine the jugular vein which was compressible and free of internal echoes. A skin marker was used to demarcate the planned venotomy and port pocket incision sites. Local anesthesia was provided to these sites and the subcutaneous tunnel track with 1% lidocaine  with 1:100,000 epinephrine . A small incision was created at the jugular access site and blunt dissection was performed of the subcutaneous tissues. Under ultrasound guidance, the jugular vein was accessed with a 21 ga micropuncture needle and an 0.018 wire was inserted to the superior vena cava. Real-time ultrasound guidance was utilized for vascular access including the acquisition of a permanent ultrasound image documenting patency of the accessed vessel. A 5 Fr micopuncture set was then used, through which a 0.035 Rosen wire was passed under fluoroscopic guidance into the inferior vena cava. An 8 Fr dilator was then placed over the wire. A subcutaneous port pocket was then created along the upper chest wall utilizing a combination of sharp and blunt dissection. The pocket was irrigated with sterile saline, packed with gauze, and observed for hemorrhage. A single lumen plastic power injectable port was chosen for placement. The 8 Fr catheter was tunneled from the port pocket site to the venotomy incision. The port was placed in the pocket. The external catheter was trimmed to appropriate length. The dilator was exchanged for an 8 Fr peel-away sheath under fluoroscopic guidance. The catheter was then placed through the sheath and the sheath was removed. Final catheter positioning was confirmed and documented with a fluoroscopic spot radiograph. The port was accessed with a Huber needle, aspirated, and flushed with heparinized saline. The deep dermal layer of the port pocket incision was closed with interrupted 3-0 Vicryl suture. Dermabond was then placed over the port pocket  and neck incisions. The patient tolerated the procedure well without immediate post procedural complication. FINDINGS: After catheter placement, the tip lies within the superior cavoatrial junction. The catheter aspirates and flushes normally and is ready for immediate use. IMPRESSION: Successful placement of a power injectable Port-A-Cath via the right internal jugular vein. The catheter is ready for immediate use. Ester Sides, MD [facility] Electronically Signed   By: Ester Sides M.D.   On: 04/02/2024 09:15    ASSESSMENT AND PLAN: This is a very pleasant 68 years old white male with stage IIIa (T3, N2, M0) non-small cell lung cancer, adenocarcinoma presented with left suprahilar mass in addition to suspicious subcarinal lymphadenopathy diagnosed in August 2025.  The patient is currently undergoing a course of concurrent chemoradiation with weekly carboplatin  for AUC of 2 and paclitaxel  45 MGs/M2 status post 1 cycle. Assessment and Plan Assessment & Plan Stage III non-small cell lung cancer, adenocarcinoma on concurrent chemoradiation Currently undergoing concurrent chemoradiation with weekly carboplatin  and paclitaxel . Completed one  cycle of treatment. Monitoring for side effects such as difficulty swallowing, which may be related to radiation therapy. Imaging planned in approximately ten weeks to assess treatment response. - Proceed with cycle two of concurrent chemoradiation with carboplatin  and paclitaxel  - Monitor for swallowing difficulties and report to radiation oncologist if symptoms worsen - Plan for imaging in approximately ten weeks to assess treatment response  Hemoptysis Intermittent hemoptysis reported, likely related to tumor response to treatment. No significant concern at this time as it is not severe or persistent.  Nausea post-chemotherapy Nausea experienced two to three days post-chemotherapy, managed with  medication which provided relief.  Weight loss Mild weight loss of approximately two to three pounds since last visit. No immediate intervention required as it is not significant at this time. The patient voices understanding of current disease status and treatment options and is in agreement with the current care plan.  All questions were answered. The patient knows to call the clinic with any problems, questions or concerns. We can certainly see the patient much sooner if necessary. The total time spent in the appointment was 30 minutes including review of chart and various tests results, discussions about plan of care and coordination of care plan .  Disclaimer: This note was dictated with voice recognition software. Similar sounding words can inadvertently be transcribed and may not be corrected upon review.

## 2024-04-17 ENCOUNTER — Ambulatory Visit
Admission: RE | Admit: 2024-04-17 | Discharge: 2024-04-17 | Disposition: A | Source: Ambulatory Visit | Attending: Radiation Oncology

## 2024-04-17 ENCOUNTER — Other Ambulatory Visit: Payer: Self-pay

## 2024-04-17 DIAGNOSIS — Z510 Encounter for antineoplastic radiation therapy: Secondary | ICD-10-CM | POA: Diagnosis not present

## 2024-04-17 LAB — RAD ONC ARIA SESSION SUMMARY
Course Elapsed Days: 3
Plan Fractions Treated to Date: 4
Plan Prescribed Dose Per Fraction: 2 Gy
Plan Total Fractions Prescribed: 30
Plan Total Prescribed Dose: 60 Gy
Reference Point Dosage Given to Date: 8 Gy
Reference Point Session Dosage Given: 2 Gy
Session Number: 4

## 2024-04-18 ENCOUNTER — Ambulatory Visit
Admission: RE | Admit: 2024-04-18 | Discharge: 2024-04-18 | Disposition: A | Discharge status: Home / Self Care | Source: Ambulatory Visit | Attending: Radiation Oncology | Admitting: Radiation Oncology

## 2024-04-18 ENCOUNTER — Other Ambulatory Visit: Payer: Self-pay

## 2024-04-18 DIAGNOSIS — C3412 Malignant neoplasm of upper lobe, left bronchus or lung: Secondary | ICD-10-CM

## 2024-04-18 DIAGNOSIS — Z510 Encounter for antineoplastic radiation therapy: Secondary | ICD-10-CM | POA: Diagnosis not present

## 2024-04-18 LAB — RAD ONC ARIA SESSION SUMMARY
Course Elapsed Days: 4
Plan Fractions Treated to Date: 5
Plan Prescribed Dose Per Fraction: 2 Gy
Plan Total Fractions Prescribed: 30
Plan Total Prescribed Dose: 60 Gy
Reference Point Dosage Given to Date: 10 Gy
Reference Point Session Dosage Given: 2 Gy
Session Number: 5

## 2024-04-18 MED ORDER — SONAFINE EX EMUL
1.0000 | Freq: Two times a day (BID) | CUTANEOUS | Status: DC
  Administered 2024-04-18: 1 via TOPICAL

## 2024-04-21 ENCOUNTER — Other Ambulatory Visit: Payer: Self-pay

## 2024-04-21 ENCOUNTER — Ambulatory Visit
Admission: RE | Admit: 2024-04-21 | Discharge: 2024-04-21 | Disposition: A | Discharge status: Home / Self Care | Source: Ambulatory Visit | Attending: Radiation Oncology | Admitting: Radiation Oncology

## 2024-04-21 DIAGNOSIS — Z510 Encounter for antineoplastic radiation therapy: Secondary | ICD-10-CM | POA: Diagnosis not present

## 2024-04-21 LAB — RAD ONC ARIA SESSION SUMMARY
Course Elapsed Days: 7
Plan Fractions Treated to Date: 6
Plan Prescribed Dose Per Fraction: 2 Gy
Plan Total Fractions Prescribed: 30
Plan Total Prescribed Dose: 60 Gy
Reference Point Dosage Given to Date: 12 Gy
Reference Point Session Dosage Given: 2 Gy
Session Number: 6

## 2024-04-22 ENCOUNTER — Inpatient Hospital Stay

## 2024-04-22 ENCOUNTER — Ambulatory Visit
Admission: RE | Admit: 2024-04-22 | Discharge: 2024-04-22 | Disposition: A | Discharge status: Home / Self Care | Source: Ambulatory Visit | Attending: Radiation Oncology | Admitting: Radiation Oncology

## 2024-04-22 ENCOUNTER — Other Ambulatory Visit: Payer: Self-pay

## 2024-04-22 ENCOUNTER — Encounter: Payer: Self-pay | Admitting: Internal Medicine

## 2024-04-22 VITALS — BP 106/74 | HR 68 | Temp 97.9°F | Resp 18 | Wt 187.5 lb

## 2024-04-22 DIAGNOSIS — Z510 Encounter for antineoplastic radiation therapy: Secondary | ICD-10-CM | POA: Diagnosis not present

## 2024-04-22 DIAGNOSIS — C3412 Malignant neoplasm of upper lobe, left bronchus or lung: Secondary | ICD-10-CM

## 2024-04-22 LAB — RAD ONC ARIA SESSION SUMMARY
Course Elapsed Days: 8
Plan Fractions Treated to Date: 7
Plan Prescribed Dose Per Fraction: 2 Gy
Plan Total Fractions Prescribed: 30
Plan Total Prescribed Dose: 60 Gy
Reference Point Dosage Given to Date: 14 Gy
Reference Point Session Dosage Given: 2 Gy
Session Number: 7

## 2024-04-22 LAB — CMP (CANCER CENTER ONLY)
ALT: 17 U/L (ref 0–44)
AST: 16 U/L (ref 15–41)
Albumin: 3.8 g/dL (ref 3.5–5.0)
Alkaline Phosphatase: 105 U/L (ref 38–126)
Anion gap: 5 (ref 5–15)
BUN: 26 mg/dL — ABNORMAL HIGH (ref 8–23)
CO2: 28 mmol/L (ref 22–32)
Calcium: 9.2 mg/dL (ref 8.9–10.3)
Chloride: 102 mmol/L (ref 98–111)
Creatinine: 0.96 mg/dL (ref 0.61–1.24)
GFR, Estimated: >60 mL/min (ref >60)
Glucose, Bld: 83 mg/dL (ref 70–99)
Potassium: 4.9 mmol/L (ref 3.5–5.1)
Sodium: 135 mmol/L (ref 135–145)
Total Bilirubin: 0.6 mg/dL (ref 0.0–1.2)
Total Protein: 7.4 g/dL (ref 6.5–8.1)

## 2024-04-22 LAB — CBC WITH DIFFERENTIAL (CANCER CENTER ONLY)
Abs Immature Granulocytes: 0.04 K/uL (ref 0.00–0.07)
Basophils Absolute: 0.1 K/uL (ref 0.0–0.1)
Basophils Relative: 1 %
Eosinophils Absolute: 0.1 K/uL (ref 0.0–0.5)
Eosinophils Relative: 2 %
HCT: 38.8 % — ABNORMAL LOW (ref 39.0–52.0)
Hemoglobin: 12.9 g/dL — ABNORMAL LOW (ref 13.0–17.0)
Immature Granulocytes: 1 %
Lymphocytes Relative: 28 %
Lymphs Abs: 1.4 K/uL (ref 0.7–4.0)
MCH: 29.1 pg (ref 26.0–34.0)
MCHC: 33.2 g/dL (ref 30.0–36.0)
MCV: 87.4 fL (ref 80.0–100.0)
Monocytes Absolute: 0.3 K/uL (ref 0.1–1.0)
Monocytes Relative: 5 %
Neutro Abs: 3.3 K/uL (ref 1.7–7.7)
Neutrophils Relative %: 63 %
Platelet Count: 185 K/uL (ref 150–400)
RBC: 4.44 MIL/uL (ref 4.22–5.81)
RDW: 13.4 % (ref 11.5–15.5)
WBC Count: 5.2 K/uL (ref 4.0–10.5)
nRBC: 0 % (ref 0.0–0.2)

## 2024-04-22 MED ORDER — SODIUM CHLORIDE 0.9 % IV SOLN: INTRAVENOUS | Status: DC

## 2024-04-22 MED ORDER — DEXAMETHASONE SODIUM PHOSPHATE 10 MG/ML IJ SOLN
10.0000 mg | Freq: Once | INTRAMUSCULAR | Status: AC
  Administered 2024-04-22: 10 mg via INTRAVENOUS
  Filled 2024-04-22: qty 1

## 2024-04-22 MED ORDER — PALONOSETRON HCL INJECTION 0.25 MG/5ML
0.2500 mg | Freq: Once | INTRAVENOUS | Status: AC
  Administered 2024-04-22: 0.25 mg via INTRAVENOUS
  Filled 2024-04-22: qty 5

## 2024-04-22 MED ORDER — DIPHENHYDRAMINE HCL 50 MG/ML IJ SOLN
50.0000 mg | Freq: Once | INTRAMUSCULAR | Status: AC
  Administered 2024-04-22: 50 mg via INTRAVENOUS
  Filled 2024-04-22: qty 1

## 2024-04-22 MED ORDER — FAMOTIDINE IN NACL 20-0.9 MG/50ML-% IV SOLN
20.0000 mg | Freq: Once | INTRAVENOUS | Status: AC
  Administered 2024-04-22: 20 mg via INTRAVENOUS
  Filled 2024-04-22: qty 50

## 2024-04-22 MED ORDER — SODIUM CHLORIDE 0.9 % IV SOLN
45.0000 mg/m2 | Freq: Once | INTRAVENOUS | Status: AC
  Administered 2024-04-22: 96 mg via INTRAVENOUS
  Filled 2024-04-22: qty 16

## 2024-04-22 MED ORDER — SODIUM CHLORIDE 0.9 % IV SOLN
216.2000 mg | Freq: Once | INTRAVENOUS | Status: AC
  Administered 2024-04-22: 220 mg via INTRAVENOUS
  Filled 2024-04-22: qty 22

## 2024-04-22 NOTE — Patient Instructions (Signed)
 [facility] - A DEPT OF [facility]  Discharge Instructions: Thank you for choosing [facility] to provide your oncology and hematology care.   If you have a lab appointment with the Cancer Center, please go directly to the Cancer Center and check in at the registration area.   Wear comfortable clothing and clothing appropriate for easy access to any Portacath or PICC line.   We strive to give you quality time with your provider. You may need to reschedule your appointment if you arrive late (15 or more minutes).  Arriving late affects you and other patients whose appointments are after yours.  Also, if you miss three or more appointments without notifying the office, you may be dismissed from the clinic at the provider's discretion.      For prescription refill requests, have your pharmacy contact our office and allow 72 hours for refills to be completed.    Today you received the following chemotherapy and/or immunotherapy agents: Paclitaxel , Carboplatin .       To help prevent nausea and vomiting after your treatment, we encourage you to take your nausea medication as directed.  BELOW ARE SYMPTOMS THAT SHOULD BE REPORTED IMMEDIATELY: *FEVER GREATER THAN 100.4 F (38 C) OR HIGHER *CHILLS OR SWEATING *NAUSEA AND VOMITING THAT IS NOT CONTROLLED WITH YOUR NAUSEA MEDICATION *UNUSUAL SHORTNESS OF BREATH *UNUSUAL BRUISING OR BLEEDING *URINARY PROBLEMS (pain or burning when urinating, or frequent urination) *BOWEL PROBLEMS (unusual diarrhea, constipation, pain near the anus) TENDERNESS IN MOUTH AND THROAT WITH OR WITHOUT PRESENCE OF ULCERS (sore throat, sores in mouth, or a toothache) UNUSUAL RASH, SWELLING OR PAIN  UNUSUAL VAGINAL DISCHARGE OR ITCHING   Items with * indicate a potential emergency and should be followed up as soon as possible or go to the Emergency Department if any problems should occur.  Please show the CHEMOTHERAPY ALERT CARD  or IMMUNOTHERAPY ALERT CARD at check-in to the Emergency Department and triage nurse.  Should you have questions after your visit or need to cancel or reschedule your appointment, please contact [facility] - A DEPT OF [facility]  Dept: [phone]  and follow the prompts.  Office hours are 8:00 a.m. to 4:30 p.m. Monday - Friday. Please note that voicemails left after 4:00 p.m. may not be returned until the following business day.  We are closed weekends and major holidays. You have access to a nurse at all times for urgent questions. Please call the main number to the clinic Dept: [phone] and follow the prompts.   For any non-urgent questions, you may also contact your provider using MyChart. We now offer e-Visits for anyone 64 and older to request care online for non-urgent symptoms. For details visit [email or web address].   Also download the MyChart app! Go to the app store, search MyChart, open the app, select [employer], and log in with your MyChart username and password.

## 2024-04-23 ENCOUNTER — Ambulatory Visit
Admission: RE | Admit: 2024-04-23 | Discharge: 2024-04-23 | Disposition: A | Discharge status: Home / Self Care | Source: Ambulatory Visit | Attending: Radiation Oncology | Admitting: Radiation Oncology

## 2024-04-23 ENCOUNTER — Other Ambulatory Visit: Payer: Self-pay

## 2024-04-23 ENCOUNTER — Encounter (HOSPITAL_COMMUNITY): Payer: Self-pay | Admitting: Internal Medicine

## 2024-04-23 DIAGNOSIS — Z510 Encounter for antineoplastic radiation therapy: Secondary | ICD-10-CM | POA: Diagnosis not present

## 2024-04-23 DIAGNOSIS — C3412 Malignant neoplasm of upper lobe, left bronchus or lung: Secondary | ICD-10-CM | POA: Diagnosis not present

## 2024-04-23 LAB — RAD ONC ARIA SESSION SUMMARY
Course Elapsed Days: 9
Plan Fractions Treated to Date: 8
Plan Prescribed Dose Per Fraction: 2 Gy
Plan Total Fractions Prescribed: 30
Plan Total Prescribed Dose: 60 Gy
Reference Point Dosage Given to Date: 16 Gy
Reference Point Session Dosage Given: 2 Gy
Session Number: 8

## 2024-04-23 NOTE — Telephone Encounter (Signed)
**Note De-identified  [place] Obfuscation** Please advise 

## 2024-04-24 ENCOUNTER — Ambulatory Visit
Admission: RE | Admit: 2024-04-24 | Discharge: 2024-04-24 | Disposition: A | Discharge status: Home / Self Care | Source: Ambulatory Visit | Attending: Radiation Oncology | Admitting: Radiation Oncology

## 2024-04-24 ENCOUNTER — Other Ambulatory Visit: Payer: Self-pay

## 2024-04-24 DIAGNOSIS — Z510 Encounter for antineoplastic radiation therapy: Secondary | ICD-10-CM | POA: Diagnosis not present

## 2024-04-24 LAB — RAD ONC ARIA SESSION SUMMARY
Course Elapsed Days: 10
Plan Fractions Treated to Date: 9
Plan Prescribed Dose Per Fraction: 2 Gy
Plan Total Fractions Prescribed: 30
Plan Total Prescribed Dose: 60 Gy
Reference Point Dosage Given to Date: 18 Gy
Reference Point Session Dosage Given: 2 Gy
Session Number: 9

## 2024-04-25 ENCOUNTER — Other Ambulatory Visit: Payer: Self-pay

## 2024-04-25 ENCOUNTER — Ambulatory Visit
Admission: RE | Admit: 2024-04-25 | Discharge: 2024-04-25 | Disposition: A | Discharge status: Home / Self Care | Source: Ambulatory Visit | Attending: Radiation Oncology | Admitting: Radiation Oncology

## 2024-04-25 DIAGNOSIS — Z510 Encounter for antineoplastic radiation therapy: Secondary | ICD-10-CM | POA: Diagnosis not present

## 2024-04-25 LAB — RAD ONC ARIA SESSION SUMMARY
Course Elapsed Days: 11
Plan Fractions Treated to Date: 10
Plan Prescribed Dose Per Fraction: 2 Gy
Plan Total Fractions Prescribed: 30
Plan Total Prescribed Dose: 60 Gy
Reference Point Dosage Given to Date: 20 Gy
Reference Point Session Dosage Given: 2 Gy
Session Number: 10

## 2024-04-27 ENCOUNTER — Emergency Department (HOSPITAL_COMMUNITY)
Admission: EM | Admit: 2024-04-27 | Discharge: 2024-04-27 | Disposition: A | Discharge status: Home / Self Care | Attending: Emergency Medicine | Admitting: Emergency Medicine

## 2024-04-27 ENCOUNTER — Emergency Department (HOSPITAL_COMMUNITY)

## 2024-04-27 DIAGNOSIS — R110 Nausea: Secondary | ICD-10-CM | POA: Insufficient documentation

## 2024-04-27 DIAGNOSIS — R5383 Other fatigue: Secondary | ICD-10-CM | POA: Insufficient documentation

## 2024-04-27 DIAGNOSIS — Z85118 Personal history of other malignant neoplasm of bronchus and lung: Secondary | ICD-10-CM | POA: Diagnosis not present

## 2024-04-27 DIAGNOSIS — R519 Headache, unspecified: Secondary | ICD-10-CM | POA: Diagnosis not present

## 2024-04-27 DIAGNOSIS — R0789 Other chest pain: Secondary | ICD-10-CM | POA: Diagnosis not present

## 2024-04-27 DIAGNOSIS — M542 Cervicalgia: Secondary | ICD-10-CM | POA: Insufficient documentation

## 2024-04-27 DIAGNOSIS — Z7982 Long term (current) use of aspirin: Secondary | ICD-10-CM | POA: Diagnosis not present

## 2024-04-27 DIAGNOSIS — R071 Chest pain on breathing: Secondary | ICD-10-CM | POA: Diagnosis not present

## 2024-04-27 DIAGNOSIS — I2510 Atherosclerotic heart disease of native coronary artery without angina pectoris: Secondary | ICD-10-CM | POA: Insufficient documentation

## 2024-04-27 DIAGNOSIS — I10 Essential (primary) hypertension: Secondary | ICD-10-CM | POA: Diagnosis not present

## 2024-04-27 DIAGNOSIS — R0602 Shortness of breath: Secondary | ICD-10-CM | POA: Diagnosis not present

## 2024-04-27 DIAGNOSIS — J439 Emphysema, unspecified: Secondary | ICD-10-CM | POA: Diagnosis not present

## 2024-04-27 LAB — BLOOD GAS, VENOUS
Acid-base deficit: 3 mmol/L — ABNORMAL HIGH (ref 0.0–2.0)
Bicarbonate: 21.1 mmol/L (ref 20.0–28.0)
O2 Saturation: 98.5 %
Patient temperature: 37
pCO2, Ven: 34 mmHg — ABNORMAL LOW (ref 44–60)
pH, Ven: 7.4 (ref 7.25–7.43)
pO2, Ven: 96 mmHg — ABNORMAL HIGH (ref 32–45)

## 2024-04-27 LAB — CBC
HCT: 39.2 % (ref 39.0–52.0)
Hemoglobin: 12.6 g/dL — ABNORMAL LOW (ref 13.0–17.0)
MCH: 28.8 pg (ref 26.0–34.0)
MCHC: 32.1 g/dL (ref 30.0–36.0)
MCV: 89.7 fL (ref 80.0–100.0)
Platelets: 143 K/uL — ABNORMAL LOW (ref 150–400)
RBC: 4.37 MIL/uL (ref 4.22–5.81)
RDW: 13.4 % (ref 11.5–15.5)
WBC: 4.4 K/uL (ref 4.0–10.5)
nRBC: 0 % (ref 0.0–0.2)

## 2024-04-27 LAB — HEPATIC FUNCTION PANEL
ALT: 23 U/L (ref 0–44)
AST: 25 U/L (ref 15–41)
Albumin: 3.8 g/dL (ref 3.5–5.0)
Alkaline Phosphatase: 107 U/L (ref 38–126)
Bilirubin, Direct: 0.2 mg/dL (ref 0.0–0.2)
Indirect Bilirubin: 0.3 mg/dL (ref 0.3–0.9)
Total Bilirubin: 0.5 mg/dL (ref 0.0–1.2)
Total Protein: 6.8 g/dL (ref 6.5–8.1)

## 2024-04-27 LAB — BASIC METABOLIC PANEL WITH GFR
Anion gap: 11 (ref 5–15)
BUN: 32 mg/dL — ABNORMAL HIGH (ref 8–23)
CO2: 21 mmol/L — ABNORMAL LOW (ref 22–32)
Calcium: 8.6 mg/dL — ABNORMAL LOW (ref 8.9–10.3)
Chloride: 102 mmol/L (ref 98–111)
Creatinine, Ser: 1.15 mg/dL (ref 0.61–1.24)
GFR, Estimated: >60 mL/min (ref >60)
Glucose, Bld: 100 mg/dL — ABNORMAL HIGH (ref 70–99)
Potassium: 4.6 mmol/L (ref 3.5–5.1)
Sodium: 134 mmol/L — ABNORMAL LOW (ref 135–145)

## 2024-04-27 LAB — TROPONIN T, HIGH SENSITIVITY
Troponin T High Sensitivity: 18 ng/L (ref 0–19)
Troponin T High Sensitivity: <15 ng/L (ref 0–19)

## 2024-04-27 LAB — RESP PANEL BY RT-PCR (RSV, FLU A&B, COVID)  RVPGX2
Influenza A by PCR: NEGATIVE
Influenza B by PCR: NEGATIVE
Resp Syncytial Virus by PCR: NEGATIVE
SARS Coronavirus 2 by RT PCR: NEGATIVE

## 2024-04-27 LAB — PRO BRAIN NATRIURETIC PEPTIDE: Pro Brain Natriuretic Peptide: 55.6 pg/mL (ref <300.0)

## 2024-04-27 LAB — MAGNESIUM: Magnesium: 1.8 mg/dL (ref 1.7–2.4)

## 2024-04-27 MED ORDER — METOCLOPRAMIDE HCL 10 MG PO TABS: 10.0000 mg | ORAL_TABLET | Freq: Three times a day (TID) | ORAL | 0 refills | Status: AC | PRN

## 2024-04-27 MED ORDER — FENTANYL CITRATE (PF) 50 MCG/ML IJ SOSY
50.0000 ug | PREFILLED_SYRINGE | Freq: Once | INTRAMUSCULAR | Status: AC
  Administered 2024-04-27: 50 ug via INTRAVENOUS
  Filled 2024-04-27: qty 1

## 2024-04-27 MED ORDER — LIDOCAINE VISCOUS HCL 2 % MT SOLN
15.0000 mL | Freq: Four times a day (QID) | OROMUCOSAL | 2 refills | Status: DC | PRN
Start: 2024-04-27 — End: 2024-04-28

## 2024-04-27 MED ORDER — ALBUTEROL SULFATE (2.5 MG/3ML) 0.083% IN NEBU: 2.5000 mg | INHALATION_SOLUTION | Freq: Four times a day (QID) | RESPIRATORY_TRACT | 12 refills | Status: AC | PRN

## 2024-04-27 MED ORDER — IPRATROPIUM-ALBUTEROL 0.5-2.5 (3) MG/3ML IN SOLN
3.0000 mL | Freq: Once | RESPIRATORY_TRACT | Status: AC
  Administered 2024-04-27: 3 mL via RESPIRATORY_TRACT
  Filled 2024-04-27: qty 3

## 2024-04-27 MED ORDER — IOHEXOL 350 MG/ML SOLN
75.0000 mL | Freq: Once | INTRAVENOUS | Status: AC | PRN
Start: 2024-04-27 — End: 2024-04-27
  Administered 2024-04-27: 75 mL via INTRAVENOUS

## 2024-04-27 MED ORDER — LACTATED RINGERS IV BOLUS
500.0000 mL | Freq: Once | INTRAVENOUS | Status: AC
  Administered 2024-04-27: 500 mL via INTRAVENOUS

## 2024-04-27 MED ORDER — LIDOCAINE VISCOUS HCL 2 % MT SOLN
15.0000 mL | Freq: Once | OROMUCOSAL | Status: AC
  Administered 2024-04-27: 15 mL via OROMUCOSAL
  Filled 2024-04-27: qty 15

## 2024-04-27 MED ORDER — HYDROCODONE-ACETAMINOPHEN 5-325 MG PO TABS: 2.0000 | ORAL_TABLET | Freq: Three times a day (TID) | ORAL | 0 refills | Status: AC | PRN

## 2024-04-27 NOTE — ED Provider Notes (Signed)
 [place] [facility] AT [facility] Provider Note   CSN: [id number] Arrival date & time: 04/27/24  1840     Patient presents with: Shortness of Breath and Chest Pain (/)   Charles Grimes is a 68 y.o. male.    Shortness of Breath Associated symptoms: chest pain, headaches and neck pain   Chest Pain Associated symptoms: fatigue, headache, shortness of breath and weakness (Generalized)   Patient presents for shortness of breath and chest pain.  Medical history includes emphysema, HTN, HLD, anxiety, depression, GERD, sleep apnea, CAD, OSA, lung cancer.  He is on week 3 of chemoradiation.  His last chemotherapy was on Tuesday.  He is getting radiation 5 days a week, last of which was Friday.  He states that he felt well for the first couple weeks.  Over this past week, he has had burning pain in his throat, painful mouth sores, shortness of breath that is worsened with exertion, fatigue, as well as pain in his head, neck, and lower lateral chest wall on both sides.  He has had poor sleep which he attributes to the prednisone  that he is given on chemotherapy days.  He has had poor p.o. intake due to loss of appetite.  He does not use nebulizers at home.  He has inhalers which he has not been using.     Prior to Admission medications   Medication Sig Start Date End Date Taking? Authorizing Provider  albuterol  (PROVENTIL ) (2.5 MG/3ML) 0.083% nebulizer solution Take 3 mLs (2.5 mg total) by nebulization every 6 (six) hours as needed for wheezing or shortness of breath. 04/27/24  Yes Melvenia Motto, MD  HYDROcodone -acetaminophen  (NORCO/VICODIN) 5-325 MG tablet Take 2 tablets by mouth every 8 (eight) hours as needed for up to 5 days for severe pain (pain score 7-10). 04/27/24 05/02/24 Yes Melvenia Motto, MD  lidocaine  (XYLOCAINE ) 2 % solution Use as directed 15 mLs in the mouth or throat every 6 (six) hours as needed for mouth pain. 04/27/24  Yes Melvenia Motto, MD  metoCLOPramide   (REGLAN ) 10 MG tablet Take 1 tablet (10 mg total) by mouth every 8 (eight) hours as needed for nausea. 04/27/24  Yes Melvenia Motto, MD  albuterol  (VENTOLIN  HFA) 108 (90 Base) MCG/ACT inhaler Inhale 2 puffs into the lungs every 6 (six) hours as needed for wheezing or shortness of breath. 02/25/24   Ruthell Lauraine FALCON, NP  ALPRAZolam  (XANAX ) 0.5 MG tablet Take 1 tablet (0.5 mg total) by mouth at bedtime as needed for anxiety. 10/24/23   [name], DO  aspirin  EC 81 MG tablet Take 81 mg by mouth daily.    [provider]  atorvastatin  (LIPITOR) 10 MG tablet Take 1 tablet (10 mg total) by mouth daily. 10/24/23   [name], DO  budesonide-glycopyrrolate -formoterol (BREZTRI  AEROSPHERE) 160-9-4.8 MCG/ACT AERO inhaler Inhale 2 puffs into the lungs in the morning and at bedtime. 02/25/24   Ruthell Lauraine FALCON, NP  budesonide-glycopyrrolate -formoterol (BREZTRI  AEROSPHERE) 160-9-4.8 MCG/ACT AERO inhaler Inhale 2 puffs into the lungs in the morning and at bedtime. 02/25/24   Ruthell Lauraine FALCON, NP  clotrimazole  (MYCELEX ) 10 MG troche Take 1 tablet (10 mg total) by mouth 3 (three) times daily. 02/25/24   Ruthell Lauraine FALCON, NP  colchicine  0.6 MG tablet Take 1 tablet (0.6 mg total) by mouth daily as needed (gout flares). 02/19/24   Shelah Lamar RAMAN, MD  docusate sodium  (COLACE) 100 MG capsule Take 1 capsule (100 mg total) by mouth every 12 (  twelve) hours. 04/14/24   Mannie Fairy DASEN, DO  lidocaine -prilocaine  (EMLA ) cream Apply to affected area once 03/26/24   Sherrod Sherrod, MD  lisinopril  (ZESTRIL ) 10 MG tablet Take 1 tablet (10 mg total) by mouth daily. 01/14/24   [name], DO  Multiple Vitamins-Minerals (CENTRUM SILVER 50+MEN) TABS Take 1 tablet by mouth daily.    [provider]  nitroGLYCERIN  (NITROSTAT ) 0.4 MG SL tablet Place 1 tablet (0.4 mg total) under the tongue every 5 (five) minutes as needed for chest pain. 02/08/21   [name], DO  NON FORMULARY Pt uses a  c-pap nightly    [provider]  omeprazole  (PRILOSEC ) 20 MG capsule Take 1 capsule (20 mg total) by mouth daily. 01/14/24   [name], DO  ondansetron  (ZOFRAN ) 8 MG tablet Take 1 tablet (8 mg total) by mouth every 8 (eight) hours as needed for nausea or vomiting. Start on the third day after chemotherapy. 03/26/24   Sherrod Sherrod, MD  polyethylene glycol powder (MIRALAX ) 17 GM/SCOOP powder Take 17 g by mouth daily. Dissolve 1 capful (17g) in 4-8 ounces of liquid and take by mouth daily. 04/14/24   Mannie Fairy T, DO  prochlorperazine  (COMPAZINE ) 10 MG tablet Take 1 tablet (10 mg total) by mouth every 6 (six) hours as needed for nausea or vomiting. 03/26/24   Sherrod Sherrod, MD  sertraline  (ZOLOFT ) 50 MG tablet Take 2 tabs in the morning and 1 tab in the evening. 01/14/24   [name], DO  tiZANidine  (ZANAFLEX ) 4 MG tablet Take 1 tablet (4 mg total) by mouth at bedtime as needed for muscle spasms. 01/14/24   [name], DO  triamcinolone  cream (KENALOG ) 0.1 % Apply 1 Application topically 2 (two) times daily as needed (itch). 11/12/23   [name], DO    Allergies: Ibuprofen, Isosorbide  nitrate, Oxycodone , and Penicillins    Review of Systems  Constitutional:  Positive for activity change, appetite change and fatigue.  Respiratory:  Positive for shortness of breath.   Cardiovascular:  Positive for chest pain.  Musculoskeletal:  Positive for neck pain.  Neurological:  Positive for weakness (Generalized) and headaches.  All other systems reviewed and are negative.   Updated Vital Signs BP 101/72   Pulse 82   Temp 98.2 F (36.8 C) (Oral)   Resp 18   SpO2 98%   Physical Exam Vitals and nursing note reviewed.  Constitutional:      General: He is not in acute distress.    Appearance: He is well-developed. He is not ill-appearing, toxic-appearing or diaphoretic.  HENT:     Head: Normocephalic and atraumatic.      Mouth/Throat:     Mouth: Mucous membranes are dry.  Eyes:     Conjunctiva/sclera: Conjunctivae normal.  Cardiovascular:     Rate and Rhythm: Normal rate and regular rhythm.     Heart sounds: No murmur heard. Pulmonary:     Effort: Pulmonary effort is normal. Tachypnea present. No respiratory distress.     Breath sounds: Decreased breath sounds present. No rhonchi or rales.  Abdominal:     Palpations: Abdomen is soft.     Tenderness: There is no abdominal tenderness.  Musculoskeletal:        General: No swelling. Normal range of motion.     Cervical back: Neck supple.     Right lower leg: No edema.     Left lower leg: No edema.  Skin:    General: Skin is  warm and dry.     Coloration: Skin is not cyanotic or pale.  Neurological:     General: No focal deficit present.     Mental Status: He is alert and oriented to person, place, and time.  Psychiatric:        Mood and Affect: Mood normal.        Behavior: Behavior normal.     (all labs ordered are listed, but only abnormal results are displayed) Labs Reviewed  BASIC METABOLIC PANEL WITH GFR - Abnormal; Notable for the following components:      Result Value   Sodium 134 (*)    CO2 21 (*)    Glucose, Bld 100 (*)    BUN 32 (*)    Calcium  8.6 (*)    All other components within normal limits  CBC - Abnormal; Notable for the following components:   Hemoglobin 12.6 (*)    Platelets 143 (*)    All other components within normal limits  BLOOD GAS, VENOUS - Abnormal; Notable for the following components:   pCO2, Ven 34 (*)    pO2, Ven 96 (*)    Acid-base deficit 3.0 (*)    All other components within normal limits  RESP PANEL BY RT-PCR (RSV, FLU A&B, COVID)  RVPGX2  MAGNESIUM   PRO BRAIN NATRIURETIC PEPTIDE  HEPATIC FUNCTION PANEL  TROPONIN T, HIGH SENSITIVITY  TROPONIN T, HIGH SENSITIVITY    EKG: None  Radiology: CT Angio Chest PE W and/or Wo Contrast Result Date: 04/27/2024 CLINICAL DATA:  Shortness of breath,  known left hilar mass EXAM: CT ANGIOGRAPHY CHEST WITH CONTRAST TECHNIQUE: Multidetector CT imaging of the chest was performed using the standard protocol during bolus administration of intravenous contrast. Multiplanar CT image reconstructions and MIPs were obtained to evaluate the vascular anatomy. RADIATION DOSE REDUCTION: This exam was performed according to the departmental dose-optimization program which includes automated exposure control, adjustment of the mA and/or kV according to patient size and/or use of iterative reconstruction technique. CONTRAST:  75mL OMNIPAQUE  IOHEXOL  350 MG/ML SOLN COMPARISON:  PET-CT from 02/18/2024, chest x-ray from earlier in the same day. FINDINGS: Cardiovascular: Atherosclerotic calcifications of the thoracic aorta are noted. No aneurysmal dilatation or dissection is seen. The pulmonary artery shows a normal branching pattern without intraluminal filling defect to suggest pulmonary embolism. Some attenuation of the left upper lobe arterial structures is noted secondary to the known left suprahilar mass. No cardiac enlargement is seen. Mediastinum/Nodes: Thoracic inlet is within normal limits. The esophagus is unremarkable. Left hilar mass/associated adenopathy consistent with the known history. Lungs/Pleura: Lungs are well aerated bilaterally. Diffuse emphysematous changes are noted. The known left hilar/suprahilar mass lesion is again identified with some attenuation of adjacent upper lobe pulmonary arterial branches. The lesion measures approximately 3.9 by 3.6 cm in greatest AP and transverse dimensions respectively. No other parenchymal nodule is seen. No sizable effusion is noted. Upper Abdomen: Visualized upper abdomen is within normal limits. Musculoskeletal: Degenerative changes of the thoracic spine are seen. Review of the MIP images confirms the above findings. IMPRESSION: No evidence of pulmonary emboli. 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 No other focal abnormality is noted. Aortic Atherosclerosis (ICD10-I70.0) and Emphysema (ICD10-J43.9). Electronically Signed   By: Oneil Devonshire M.D.   On: 04/27/2024 21:29   DG Chest 2 View Result Date: 04/27/2024 CLINICAL DATA:  Chest pain shortness of breath EXAM: CHEST - 2 VIEW COMPARISON:  02/18/2024 PET-CT FINDINGS: Cardiac shadow is within normal limits. Right chest  wall port is noted. Left hilar and suprahilar mass lesion is again identified and stable from prior PET-CT. No sizable effusion is noted. No bony abnormality is seen. IMPRESSION: Stable left hilar and suprahilar mass. Electronically Signed   By: Oneil Devonshire M.D.   On: 04/27/2024 19:30     Procedures   Medications Ordered in the ED  ipratropium-albuterol  (DUONEB) 0.5-2.5 (3) MG/3ML nebulizer solution 3 mL (3 mLs Nebulization Given 04/27/24 1948)  lidocaine  (XYLOCAINE ) 2 % viscous mouth solution 15 mL (15 mLs Mouth/Throat Given 04/27/24 1948)  lactated ringers  bolus 500 mL (0 mLs Intravenous Stopped 04/27/24 2115)  lactated ringers  bolus 500 mL (0 mLs Intravenous Stopped 04/27/24 2208)  fentaNYL  (SUBLIMAZE ) injection 50 mcg (50 mcg Intravenous Given 04/27/24 2121)  iohexol  (OMNIPAQUE ) 350 MG/ML injection 75 mL (75 mLs Intravenous Contrast Given 04/27/24 2116)                                    Medical Decision Making Amount and/or Complexity of Data Reviewed Labs: ordered. Radiology: ordered.  Risk Prescription drug management.   This patient presents to the ED for concern of shortness of breath, this involves an extensive number of treatment options, and is a complaint that carries with it a high risk of complications and morbidity.  The differential diagnosis includes side effects of cancer treatment, reactive airway disease exacerbation, pneumonia, PE, CHF, acidosis, anemia   Co morbidities / Chronic conditions that complicate the patient evaluation  emphysema, HTN, HLD, anxiety, depression,  GERD, sleep apnea, CAD, OSA, lung cancer   Additional history obtained:  Additional history obtained from EMR External records from outside source obtained and reviewed including patient's family   Lab Tests:  I Ordered, and personally interpreted labs.  The pertinent results include: Normal kidney function, normal electrolytes, normal hemoglobin, no leukocytosis normal troponin, normal BNP   Imaging Studies ordered:  I ordered imaging studies including chest x-ray, CTA chest I independently visualized and interpreted imaging which showed no acute findings, redemonstration of known neoplasm I agree with the radiologist interpretation   Cardiac Monitoring: / EKG:  The patient was maintained on a cardiac monitor.  I personally viewed and interpreted the cardiac monitored which showed an underlying rhythm of: Sinus rhythm   Problem List / ED Course / Critical interventions / Medication management  Patient resenting for multiple complaints, chief among them shortness of breath that is worsened with exertion.  He does have a history of emphysema and is currently being treated for lung cancer.  Vital signs on arrival notable for mild tachycardia and soft blood pressure.  He has been having very poor p.o. intake.  Current breathing is unlabored.  He is able to speak in complete sentences.  He is mildly tachypneic.  He does have diminished breath sounds on lung auscultation.  DuoNeb was ordered.  Workup was initiated.  Lab work and imaging studies were unremarkable.  On reassessment, patient reports improved symptoms.  I suspect that his shortness of breath is multifactorial from ongoing radiation treatments, underlying emphysema, as well as dehydration.  Patient to be provided prescriptions for Reglan , Percocet, albuterol , and viscous lidocaine .  He was advised to follow-up with his oncologist for ongoing management of his symptoms.  Patient was discharged in stable condition. I ordered  medication including IV fluids for hydration, fentanyl  and viscous lidocaine  for analgesia, DuoNeb for emphysema Reevaluation of the patient after these medicines showed that  the patient improved I have reviewed the patients home medicines and have made adjustments as needed  Social Determinants of Health:  Lives at home with family     Final diagnoses:  Shortness of breath  Nausea    ED Discharge Orders          Ordered    lidocaine  (XYLOCAINE ) 2 % solution  Every 6 hours PRN        04/27/24 2244    albuterol  (PROVENTIL ) (2.5 MG/3ML) 0.083% nebulizer solution  Every 6 hours PRN        04/27/24 2244    metoCLOPramide  (REGLAN ) 10 MG tablet  Every 8 hours PRN        04/27/24 2244    HYDROcodone -acetaminophen  (NORCO/VICODIN) 5-325 MG tablet  Every 8 hours PRN        04/27/24 2244    For home use only DME Nebulizer machine        04/27/24 2244               Melvenia Motto, MD 04/27/24 2245

## 2024-04-27 NOTE — Discharge Instructions (Addendum)
 Prescriptions were sent to your pharmacy: -Use albuterol  as needed for chest tightness and shortness of breath -Metoclopramide  is a medication to take as needed for nausea. -Viscous lidocaine  can be taken to help with your mouth and throat pain. -Hydrocodone  is a narcotic pain medication.  Take only as needed.  Follow-up with your oncologist for ongoing management of your symptoms.  Return to the emergency department for any new or worsening symptoms of concern.

## 2024-04-27 NOTE — ED Triage Notes (Signed)
 Pt states that for the last two days he's been having increased SOB and chest tightness that radiates into his neck and back. Pt also states he feels as though he's having GERD symptoms. Pt actively receiving chemo and radiation for lung cancer.

## 2024-04-28 ENCOUNTER — Ambulatory Visit
Admission: RE | Admit: 2024-04-28 | Discharge: 2024-04-28 | Disposition: A | Source: Ambulatory Visit | Attending: Radiation Oncology

## 2024-04-28 ENCOUNTER — Inpatient Hospital Stay

## 2024-04-28 ENCOUNTER — Inpatient Hospital Stay (HOSPITAL_BASED_OUTPATIENT_CLINIC_OR_DEPARTMENT_OTHER): Admitting: Internal Medicine

## 2024-04-28 ENCOUNTER — Other Ambulatory Visit: Payer: Self-pay

## 2024-04-28 VITALS — BP 108/64 | HR 70 | Temp 97.7°F | Resp 17 | Ht 71.0 in | Wt 187.5 lb

## 2024-04-28 DIAGNOSIS — C3412 Malignant neoplasm of upper lobe, left bronchus or lung: Secondary | ICD-10-CM | POA: Diagnosis not present

## 2024-04-28 DIAGNOSIS — Z510 Encounter for antineoplastic radiation therapy: Secondary | ICD-10-CM | POA: Diagnosis not present

## 2024-04-28 LAB — RAD ONC ARIA SESSION SUMMARY
Course Elapsed Days: 14
Plan Fractions Treated to Date: 11
Plan Prescribed Dose Per Fraction: 2 Gy
Plan Total Fractions Prescribed: 30
Plan Total Prescribed Dose: 60 Gy
Reference Point Dosage Given to Date: 22 Gy
Reference Point Session Dosage Given: 2 Gy
Session Number: 11

## 2024-04-28 LAB — CBC WITH DIFFERENTIAL (CANCER CENTER ONLY)
Abs Immature Granulocytes: 0.02 K/uL (ref 0.00–0.07)
Basophils Absolute: 0.1 K/uL (ref 0.0–0.1)
Basophils Relative: 1 %
Eosinophils Absolute: 0 K/uL (ref 0.0–0.5)
Eosinophils Relative: 1 %
HCT: 35.5 % — ABNORMAL LOW (ref 39.0–52.0)
Hemoglobin: 12 g/dL — ABNORMAL LOW (ref 13.0–17.0)
Immature Granulocytes: 1 %
Lymphocytes Relative: 22 %
Lymphs Abs: 0.8 K/uL (ref 0.7–4.0)
MCH: 29.7 pg (ref 26.0–34.0)
MCHC: 33.8 g/dL (ref 30.0–36.0)
MCV: 87.9 fL (ref 80.0–100.0)
Monocytes Absolute: 0.3 K/uL (ref 0.1–1.0)
Monocytes Relative: 7 %
Neutro Abs: 2.4 K/uL (ref 1.7–7.7)
Neutrophils Relative %: 68 %
Platelet Count: 132 K/uL — ABNORMAL LOW (ref 150–400)
RBC: 4.04 MIL/uL — ABNORMAL LOW (ref 4.22–5.81)
RDW: 13.3 % (ref 11.5–15.5)
WBC Count: 3.5 K/uL — ABNORMAL LOW (ref 4.0–10.5)
nRBC: 0 % (ref 0.0–0.2)

## 2024-04-28 LAB — CMP (CANCER CENTER ONLY)
ALT: 19 U/L (ref 0–44)
AST: 18 U/L (ref 15–41)
Albumin: 3.6 g/dL (ref 3.5–5.0)
Alkaline Phosphatase: 86 U/L (ref 38–126)
Anion gap: 4 — ABNORMAL LOW (ref 5–15)
BUN: 29 mg/dL — ABNORMAL HIGH (ref 8–23)
CO2: 27 mmol/L (ref 22–32)
Calcium: 8.7 mg/dL — ABNORMAL LOW (ref 8.9–10.3)
Chloride: 105 mmol/L (ref 98–111)
Creatinine: 0.97 mg/dL (ref 0.61–1.24)
GFR, Estimated: >60 mL/min (ref >60)
Glucose, Bld: 79 mg/dL (ref 70–99)
Potassium: 4.5 mmol/L (ref 3.5–5.1)
Sodium: 136 mmol/L (ref 135–145)
Total Bilirubin: 0.6 mg/dL (ref 0.0–1.2)
Total Protein: 6.6 g/dL (ref 6.5–8.1)

## 2024-04-28 MED ORDER — LIDOCAINE VISCOUS HCL 2 % MT SOLN: 15.0000 mL | Freq: Four times a day (QID) | OROMUCOSAL | 2 refills | Status: DC | PRN

## 2024-04-28 MED ORDER — PALONOSETRON HCL INJECTION 0.25 MG/5ML
0.2500 mg | Freq: Once | INTRAVENOUS | Status: AC
  Administered 2024-04-28: 0.25 mg via INTRAVENOUS
  Filled 2024-04-28: qty 5

## 2024-04-28 MED ORDER — SODIUM CHLORIDE 0.9 % IV SOLN
216.2000 mg | Freq: Once | INTRAVENOUS | Status: AC
  Administered 2024-04-28: 220 mg via INTRAVENOUS
  Filled 2024-04-28: qty 22

## 2024-04-28 MED ORDER — DIPHENHYDRAMINE HCL 50 MG/ML IJ SOLN
50.0000 mg | Freq: Once | INTRAMUSCULAR | Status: AC
  Administered 2024-04-28: 50 mg via INTRAVENOUS
  Filled 2024-04-28: qty 1

## 2024-04-28 MED ORDER — SODIUM CHLORIDE 0.9 % IV SOLN
45.0000 mg/m2 | Freq: Once | INTRAVENOUS | Status: AC
  Administered 2024-04-28: 96 mg via INTRAVENOUS
  Filled 2024-04-28: qty 16

## 2024-04-28 MED ORDER — FAMOTIDINE IN NACL 20-0.9 MG/50ML-% IV SOLN
20.0000 mg | Freq: Once | INTRAVENOUS | Status: AC
  Administered 2024-04-28: 20 mg via INTRAVENOUS
  Filled 2024-04-28: qty 50

## 2024-04-28 MED ORDER — AEROCHAMBER PLUS FLO-VU MISC: 0 refills | Status: AC

## 2024-04-28 MED ORDER — SODIUM CHLORIDE 0.9 % IV SOLN: INTRAVENOUS | Status: DC

## 2024-04-28 NOTE — Patient Instructions (Signed)
 [facility] - A DEPT OF [facility]  Discharge Instructions: Thank you for choosing [facility] to provide your oncology and hematology care.   If you have a lab appointment with the Cancer Center, please go directly to the Cancer Center and check in at the registration area.   Wear comfortable clothing and clothing appropriate for easy access to any Portacath or PICC line.   We strive to give you quality time with your provider. You may need to reschedule your appointment if you arrive late (15 or more minutes).  Arriving late affects you and other patients whose appointments are after yours.  Also, if you miss three or more appointments without notifying the office, you may be dismissed from the clinic at the provider's discretion.      For prescription refill requests, have your pharmacy contact our office and allow 72 hours for refills to be completed.    Today you received the following chemotherapy and/or immunotherapy agents: Paclitaxel , Carboplatin .       To help prevent nausea and vomiting after your treatment, we encourage you to take your nausea medication as directed.  BELOW ARE SYMPTOMS THAT SHOULD BE REPORTED IMMEDIATELY: *FEVER GREATER THAN 100.4 F (38 C) OR HIGHER *CHILLS OR SWEATING *NAUSEA AND VOMITING THAT IS NOT CONTROLLED WITH YOUR NAUSEA MEDICATION *UNUSUAL SHORTNESS OF BREATH *UNUSUAL BRUISING OR BLEEDING *URINARY PROBLEMS (pain or burning when urinating, or frequent urination) *BOWEL PROBLEMS (unusual diarrhea, constipation, pain near the anus) TENDERNESS IN MOUTH AND THROAT WITH OR WITHOUT PRESENCE OF ULCERS (sore throat, sores in mouth, or a toothache) UNUSUAL RASH, SWELLING OR PAIN  UNUSUAL VAGINAL DISCHARGE OR ITCHING   Items with * indicate a potential emergency and should be followed up as soon as possible or go to the Emergency Department if any problems should occur.  Please show the CHEMOTHERAPY ALERT CARD  or IMMUNOTHERAPY ALERT CARD at check-in to the Emergency Department and triage nurse.  Should you have questions after your visit or need to cancel or reschedule your appointment, please contact [facility] - A DEPT OF [facility]  Dept: [phone]  and follow the prompts.  Office hours are 8:00 a.m. to 4:30 p.m. Monday - Friday. Please note that voicemails left after 4:00 p.m. may not be returned until the following business day.  We are closed weekends and major holidays. You have access to a nurse at all times for urgent questions. Please call the main number to the clinic Dept: [phone] and follow the prompts.   For any non-urgent questions, you may also contact your provider using MyChart. We now offer e-Visits for anyone 64 and older to request care online for non-urgent symptoms. For details visit [email or web address].   Also download the MyChart app! Go to the app store, search MyChart, open the app, select [employer], and log in with your MyChart username and password.

## 2024-04-28 NOTE — Progress Notes (Signed)
 [facility] [phone]   Fax:([phone]  OFFICE PROGRESS NOTE  [name], DO [address] [place] [address]  DIAGNOSIS: Stage IIIa (T3, N2, M0) non-small cell lung cancer, adenocarcinoma presented with left suprahilar mass in addition to suspicious subcarinal lymphadenopathy diagnosed in August 2025.   PRIOR THERAPY: None  CURRENT THERAPY: A course of concurrent chemoradiation with weekly carboplatin  for AUC 2 and paclitaxel  45 MGs/M2.  Status post 3 cycles.    INTERVAL HISTORY: ANTONY SIAN 68 y.o. male returns to the clinic today for follow-up visit accompanied by his son.Discussed the use of AI scribe software for clinical note transcription with the patient, who gave verbal consent to proceed.  History of Present Illness Demitrius Crass is a 68 year old male with stage 3 small cell lung cancer who presents for evaluation before starting cycle four of chemoradiation. He is accompanied by his son.  He was diagnosed with stage 3 small cell lung cancer in August 2025 and is currently undergoing concurrent chemoradiation with weekly carboplatin . He has completed three cycles and is here for evaluation before starting the fourth cycle.  He experienced difficulty breathing, severe sore throat, and a burning sensation akin to severe heartburn, which led him to visit the emergency department the previous night. He described feeling 'so tight in my chest' and attributed it to his breathing difficulties. In the emergency department, he received fluids and viscous lidocaine , which provided significant relief by numbing his throat. A CT angiogram of the chest was performed, showing no blood clots or new growths.  He reports burning in the throat and esophagus. He uses Carafate and viscous lidocaine  to manage these symptoms.  He is experiencing significant sleep disturbances, stating 'I'm not sleeping good at all.' He  reports staying awake all night after treatments and sleeping during the day. He has been taking Benadryl , up to four tablets (100 mg), to aid sleep, but it is not consistently effective. He needs to 'start getting some rest' as the lack of sleep is causing stiffness and fatigue.     MEDICAL HISTORY: Past Medical History:  Diagnosis Date   Anginal pain    Anxiety    Arthritis    Coronary artery disease, non-occlusive 08/2013   Mild to moderate (40% OM1) single vessel CAD. Otherwise nonobstructed.   Depression    Dyslipidemia, goal LDL below 100    Essential hypertension    Family history of premature CAD    GERD (gastroesophageal reflux disease)    H/O skin disorder    Involving hands. Subsequently resolved.    Nonischemic cardiomyopathy (HCC) 05/2013   Non-ischemic: EF ~45% by Echo & Myoview  --> Non-obstructive CAD   Obesity (BMI 30.0-34.9)    OSA (obstructive sleep apnea) 06/21/2017   uses CPAP   Sleep apnea     ALLERGIES:  is allergic to ibuprofen, isosorbide  nitrate, oxycodone , and penicillins.  MEDICATIONS:  Current Outpatient Medications  Medication Sig Dispense Refill   albuterol  (PROVENTIL ) (2.5 MG/3ML) 0.083% nebulizer solution Take 3 mLs (2.5 mg total) by nebulization every 6 (six) hours as needed for wheezing or shortness of breath. 75 mL 12   albuterol  (VENTOLIN  HFA) 108 (90 Base) MCG/ACT inhaler Inhale 2 puffs into the lungs every 6 (six) hours as needed for wheezing or shortness of breath. 8 g 2   ALPRAZolam  (XANAX ) 0.5 MG tablet Take 1 tablet (0.5 mg total) by mouth at bedtime as  needed for anxiety. 30 tablet 0   aspirin  EC 81 MG tablet Take 81 mg by mouth daily.     atorvastatin  (LIPITOR) 10 MG tablet Take 1 tablet (10 mg total) by mouth daily. 90 tablet 3   budesonide-glycopyrrolate -formoterol (BREZTRI  AEROSPHERE) 160-9-4.8 MCG/ACT AERO inhaler Inhale 2 puffs into the lungs in the morning and at bedtime. 10.7 each 6   budesonide-glycopyrrolate -formoterol  (BREZTRI  AEROSPHERE) 160-9-4.8 MCG/ACT AERO inhaler Inhale 2 puffs into the lungs in the morning and at bedtime.     clotrimazole  (MYCELEX ) 10 MG troche Take 1 tablet (10 mg total) by mouth 3 (three) times daily. 21 Troche 0   colchicine  0.6 MG tablet Take 1 tablet (0.6 mg total) by mouth daily as needed (gout flares).     docusate sodium  (COLACE) 100 MG capsule Take 1 capsule (100 mg total) by mouth every 12 (twelve) hours. 60 capsule 0   HYDROcodone -acetaminophen  (NORCO/VICODIN) 5-325 MG tablet Take 2 tablets by mouth every 8 (eight) hours as needed for up to 5 days for severe pain (pain score 7-10). 15 tablet 0   lidocaine  (XYLOCAINE ) 2 % solution Use as directed 15 mLs in the mouth or throat every 6 (six) hours as needed for mouth pain. 100 mL 2   lidocaine -prilocaine  (EMLA ) cream Apply to affected area once 30 g 3   lisinopril  (ZESTRIL ) 10 MG tablet Take 1 tablet (10 mg total) by mouth daily. 90 tablet 3   metoCLOPramide  (REGLAN ) 10 MG tablet Take 1 tablet (10 mg total) by mouth every 8 (eight) hours as needed for nausea. 30 tablet 0   Multiple Vitamins-Minerals (CENTRUM SILVER 50+MEN) TABS Take 1 tablet by mouth daily.     nitroGLYCERIN  (NITROSTAT ) 0.4 MG SL tablet Place 1 tablet (0.4 mg total) under the tongue every 5 (five) minutes as needed for chest pain. 30 tablet 1   NON FORMULARY Pt uses a c-pap nightly     omeprazole  (PRILOSEC ) 20 MG capsule Take 1 capsule (20 mg total) by mouth daily. 90 capsule 3   ondansetron  (ZOFRAN ) 8 MG tablet Take 1 tablet (8 mg total) by mouth every 8 (eight) hours as needed for nausea or vomiting. Start on the third day after chemotherapy. 30 tablet 1   polyethylene glycol powder (MIRALAX ) 17 GM/SCOOP powder Take 17 g by mouth daily. Dissolve 1 capful (17g) in 4-8 ounces of liquid and take by mouth daily. 238 g 0   prochlorperazine  (COMPAZINE ) 10 MG tablet Take 1 tablet (10 mg total) by mouth every 6 (six) hours as needed for nausea or vomiting. 30 tablet 1    sertraline  (ZOLOFT ) 50 MG tablet Take 2 tabs in the morning and 1 tab in the evening. 270 tablet 3   tiZANidine  (ZANAFLEX ) 4 MG tablet Take 1 tablet (4 mg total) by mouth at bedtime as needed for muscle spasms. 30 tablet 2   triamcinolone  cream (KENALOG ) 0.1 % Apply 1 Application topically 2 (two) times daily as needed (itch). 30 g 1   No current facility-administered medications for this visit.    SURGICAL HISTORY:  Past Surgical History:  Procedure Laterality Date   COLONOSCOPY  2015   COLONOSCOPY WITH PROPOFOL   01/02/2017   Dr.Pyrtle   IR IMAGING GUIDED PORT INSERTION  04/02/2024   LEFT HEART CATHETERIZATION WITH CORONARY ANGIOGRAM  08/26/2013   Mild to moderate disease with 40-50% stenosis and OM1. Otherwise no significant CAD --  Surgeon: Alm LELON Clay, MD;  Location: [facility] CATH LAB;  Service: Cardiovascular;;  Lower extremity arterial Dopplers  06/11/2013   No occlusive disease   LUMBAR LAMINECTOMY/DECOMPRESSION MICRODISCECTOMY Right 10/07/2014   Procedure: LUMBAR LAMINECTOMY/DECOMPRESSION MICRODISCECTOMY 1 LEVEL;  Surgeon: Arley Helling, MD;  Location: [facility] NEURO ORS;  Service: Neurosurgery;  Laterality: Right;  Right L3-L4 Microdiscectomy   NM MYOVIEW  LTD  06/03/2013   Low risk study with no ischemia. EF roughly 44% with no regional wall motion abnormalities noted   PFTs  06/16/2013   Normal Volumes &  Spirometry; Moderately reduced DLCO   POLYPECTOMY     SHOULDER HEMI-ARTHROPLASTY Right 05/28/2015   Procedure: RIGHT SHOULDER HEMI-ARTHROPLASTY CTA HEAD AND SUBSCAP REPAIR ;  Surgeon: Marcey Her, MD;  Location: [facility] OR;  Service: Orthopedics;  Laterality: Right;   TRANSTHORACIC ECHOCARDIOGRAM  06/11/2013   Mildly reduced EF: 45-50%. Mild anterior hypokinesis with incoordinate septal motion. No evidence of pulmonary hypertension   VIDEO BRONCHOSCOPY WITH ENDOBRONCHIAL ULTRASOUND Left 02/19/2024   Procedure: BRONCHOSCOPY, WITH EBUS;  Surgeon: Shelah Lamar RAMAN, MD;  Location: [facility] ENDOSCOPY;   Service: Pulmonary;  Laterality: Left;    REVIEW OF SYSTEMS:  Constitutional: positive for fatigue Eyes: negative Ears, nose, mouth, throat, and face: negative Respiratory: positive for dyspnea on exertion and pleurisy/chest pain Cardiovascular: negative Gastrointestinal: positive for odynophagia Genitourinary:negative Integument/breast: negative Hematologic/lymphatic: negative Musculoskeletal:negative Neurological: negative Behavioral/Psych: positive for sleep disturbance Endocrine: negative Allergic/Immunologic: negative   PHYSICAL EXAMINATION: General appearance: alert, cooperative, fatigued, and no distress Head: Normocephalic, without obvious abnormality, atraumatic Neck: no adenopathy, no JVD, supple, symmetrical, trachea midline, and thyroid not enlarged, symmetric, no tenderness/mass/nodules Lymph nodes: Cervical, supraclavicular, and axillary nodes normal. Resp: clear to auscultation bilaterally Back: symmetric, no curvature. ROM normal. No CVA tenderness. Cardio: regular rate and rhythm, S1, S2 normal, no murmur, click, rub or gallop GI: soft, non-tender; bowel sounds normal; no masses,  no organomegaly Extremities: extremities normal, atraumatic, no cyanosis or edema Neurologic: Alert and oriented X 3, normal strength and tone. Normal symmetric reflexes. Normal coordination and gait  ECOG PERFORMANCE STATUS: 1 - Symptomatic but completely ambulatory  Blood pressure 108/64, pulse 70, temperature 97.7 F (36.5 C), resp. rate 17, height 5' 11 (1.803 m), weight 187 lb 8 oz (85 kg), SpO2 99%.  LABORATORY DATA: Lab Results  Component Value Date   WBC 3.5 (L) 04/28/2024   HGB 12.0 (L) 04/28/2024   HCT 35.5 (L) 04/28/2024   MCV 87.9 04/28/2024   PLT 132 (L) 04/28/2024      Chemistry      Component Value Date/Time   NA 134 (L) 04/27/2024 1941   K 4.6 04/27/2024 1941   CL 102 04/27/2024 1941   CO2 21 (L) 04/27/2024 1941   BUN 32 (H) 04/27/2024 1941   CREATININE  1.15 04/27/2024 1941   CREATININE 0.96 04/22/2024 0753   CREATININE 0.84 04/30/2015 1738      Component Value Date/Time   CALCIUM  8.6 (L) 04/27/2024 1941   ALKPHOS 107 04/27/2024 1941   AST 25 04/27/2024 1941   AST 16 04/22/2024 0753   ALT 23 04/27/2024 1941   ALT 17 04/22/2024 0753   BILITOT 0.5 04/27/2024 1941   BILITOT 0.6 04/22/2024 0753       RADIOGRAPHIC STUDIES: CT Angio Chest PE W and/or Wo Contrast Result Date: 04/27/2024 CLINICAL DATA:  Shortness of breath, known left hilar mass EXAM: CT ANGIOGRAPHY CHEST WITH CONTRAST TECHNIQUE: Multidetector CT imaging of the chest was performed using the standard protocol during bolus administration of intravenous contrast. Multiplanar CT image reconstructions and MIPs were obtained to  evaluate the vascular anatomy. RADIATION DOSE REDUCTION: This exam was performed according to the departmental dose-optimization program which includes automated exposure control, adjustment of the mA and/or kV according to patient size and/or use of iterative reconstruction technique. CONTRAST:  75mL OMNIPAQUE  IOHEXOL  350 MG/ML SOLN COMPARISON:  PET-CT from 02/18/2024, chest x-ray from earlier in the same day. FINDINGS: Cardiovascular: Atherosclerotic calcifications of the thoracic aorta are noted. No aneurysmal dilatation or dissection is seen. The pulmonary artery shows a normal branching pattern without intraluminal filling defect to suggest pulmonary embolism. Some attenuation of the left upper lobe arterial structures is noted secondary to the known left suprahilar mass. No cardiac enlargement is seen. Mediastinum/Nodes: Thoracic inlet is within normal limits. The esophagus is unremarkable. Left hilar mass/associated adenopathy consistent with the known history. Lungs/Pleura: Lungs are well aerated bilaterally. Diffuse emphysematous changes are noted. The known left hilar/suprahilar mass lesion is again identified with some attenuation of adjacent upper lobe  pulmonary arterial branches. The lesion measures approximately 3.9 by 3.6 cm in greatest AP and transverse dimensions respectively. No other parenchymal nodule is seen. No sizable effusion is noted. Upper Abdomen: Visualized upper abdomen is within normal limits. Musculoskeletal: Degenerative changes of the thoracic spine are seen. Review of the MIP images confirms the above findings. IMPRESSION: No evidence of pulmonary emboli. Stable appearing left hilar/suprahilar mass lesion consistent with neoplasm. No other focal abnormality is noted. Aortic Atherosclerosis (ICD10-I70.0) and Emphysema (ICD10-J43.9). Electronically Signed   By: Oneil Devonshire M.D.   On: 04/27/2024 21:29   DG Chest 2 View Result Date: 04/27/2024 CLINICAL DATA:  Chest pain shortness of breath EXAM: CHEST - 2 VIEW COMPARISON:  02/18/2024 PET-CT FINDINGS: Cardiac shadow is within normal limits. Right chest wall port is noted. Left hilar and suprahilar mass lesion is again identified and stable from prior PET-CT. No sizable effusion is noted. No bony abnormality is seen. IMPRESSION: Stable left hilar and suprahilar mass. Electronically Signed   By: Oneil Devonshire M.D.   On: 04/27/2024 19:30   CT ABDOMEN PELVIS WO CONTRAST Result Date: 04/14/2024 EXAM: CT ABDOMEN AND PELVIS WITHOUT CONTRAST 04/14/2024 07:55:09 PM TECHNIQUE: CT of the abdomen and pelvis was performed without the administration of intravenous contrast. Multiplanar reformatted images are provided for review. Automated exposure control, iterative reconstruction, and/or weight-based adjustment of the mA/kV was utilized to reduce the radiation dose to as low as reasonably achievable. COMPARISON: CT abdomen and pelvis 05/17/2023. CLINICAL HISTORY: Bowel obstruction suspected. Per triage note: Pt arrives with c/o constipation. Pt reports that he has only had a small BM in the past 6 days since starting his chemo treatment last Wednesday. Pt denies fevers or n/v. Pt reports lower ABD pain.  FINDINGS: LOWER CHEST: Emphysema in the lower lungs. No acute abnormality in the lower chest. LIVER: The liver is unremarkable. GALLBLADDER AND BILE DUCTS: Gallbladder is unremarkable. No biliary ductal dilatation. SPLEEN: No acute abnormality. PANCREAS: No acute abnormality. ADRENAL GLANDS: No acute abnormality. KIDNEYS, URETERS AND BLADDER: No stones in the kidneys or ureters. No hydronephrosis. No perinephric or periureteral stranding. Urinary bladder is unremarkable. GI AND BOWEL: Stomach demonstrates no acute abnormality. There is no bowel obstruction. Normal appendix. PERITONEUM AND RETROPERITONEUM: No ascites. No free air. VASCULATURE: Aorta is normal in caliber. Aortic atherosclerotic calcification. LYMPH NODES: No lymphadenopathy. REPRODUCTIVE ORGANS: No acute abnormality. BONES AND SOFT TISSUES: No acute osseous abnormality. No focal soft tissue abnormality. IMPRESSION: 1. No acute findings in the abdomen or pelvis. Electronically signed by: Norman Gatlin MD 04/14/2024 08:04  PM EDT RP Workstation: [id number]   IR IMAGING GUIDED PORT INSERTION Result Date: 04/02/2024 INDICATION: 68 year old male with history of lung cancer requiring central venous access for chemotherapy administration. EXAM: IMPLANTED PORT A CATH PLACEMENT WITH ULTRASOUND AND FLUOROSCOPIC GUIDANCE COMPARISON:  None Available. MEDICATIONS: None. ANESTHESIA/SEDATION: Moderate (conscious) sedation was employed during this procedure. A total of Versed  2 mg and Fentanyl  100 mcg was administered intravenously. Moderate Sedation Time: 15 minutes. The patient's level of consciousness and vital signs were monitored continuously by radiology nursing throughout the procedure under my direct supervision. CONTRAST:  None FLUOROSCOPY TIME:  2.4 mGy reference air kerma COMPLICATIONS: None immediate. PROCEDURE: The procedure, risks, benefits, and alternatives were explained to the patient. Questions regarding the procedure were encouraged and  answered. The patient understands and consents to the procedure. The right neck and chest were prepped with chlorhexidine  in a sterile fashion, and a sterile drape was applied covering the operative field. Maximum barrier sterile technique with sterile gowns and gloves were used for the procedure. A timeout was performed prior to the initiation of the procedure. Ultrasound was used to examine the jugular vein which was compressible and free of internal echoes. A skin marker was used to demarcate the planned venotomy and port pocket incision sites. Local anesthesia was provided to these sites and the subcutaneous tunnel track with 1% lidocaine  with 1:100,000 epinephrine . A small incision was created at the jugular access site and blunt dissection was performed of the subcutaneous tissues. Under ultrasound guidance, the jugular vein was accessed with a 21 ga micropuncture needle and an 0.018 wire was inserted to the superior vena cava. Real-time ultrasound guidance was utilized for vascular access including the acquisition of a permanent ultrasound image documenting patency of the accessed vessel. A 5 Fr micopuncture set was then used, through which a 0.035 Rosen wire was passed under fluoroscopic guidance into the inferior vena cava. An 8 Fr dilator was then placed over the wire. A subcutaneous port pocket was then created along the upper chest wall utilizing a combination of sharp and blunt dissection. The pocket was irrigated with sterile saline, packed with gauze, and observed for hemorrhage. A single lumen plastic power injectable port was chosen for placement. The 8 Fr catheter was tunneled from the port pocket site to the venotomy incision. The port was placed in the pocket. The external catheter was trimmed to appropriate length. The dilator was exchanged for an 8 Fr peel-away sheath under fluoroscopic guidance. The catheter was then placed through the sheath and the sheath was removed. Final catheter  positioning was confirmed and documented with a fluoroscopic spot radiograph. The port was accessed with a Huber needle, aspirated, and flushed with heparinized saline. The deep dermal layer of the port pocket incision was closed with interrupted 3-0 Vicryl suture. Dermabond was then placed over the port pocket and neck incisions. The patient tolerated the procedure well without immediate post procedural complication. FINDINGS: After catheter placement, the tip lies within the superior cavoatrial junction. The catheter aspirates and flushes normally and is ready for immediate use. IMPRESSION: Successful placement of a power injectable Port-A-Cath via the right internal jugular vein. The catheter is ready for immediate use. Ester Sides, MD [facility]: [facility] Electronically Signed   By: Ester Sides M.D.   On: 04/02/2024 09:15    ASSESSMENT AND PLAN: This is a very pleasant 68 years old white male with stage IIIa (T3, N2, M0) non-small cell lung cancer, adenocarcinoma presented with left  suprahilar mass in addition to suspicious subcarinal lymphadenopathy diagnosed in August 2025.  The patient is currently undergoing a course of concurrent chemoradiation with weekly carboplatin  for AUC of 2 and paclitaxel  45 MGs/M2 status post 3 cycles. He has been tolerating his treatment well except for a dyne aphasia and lack of sleep after the chemotherapy from the steroid injection. Assessment and Plan Assessment & Plan Stage 3 non-small cell lung cancer, adenocarcinoma Diagnosed in August 2025, currently undergoing concurrent chemoradiation with weekly carboplatin . Completed three cycles, preparing for the fourth. Recent CT angiogram of the chest showed no thromboembolic events or new growths, indicating well-managed disease. Experiencing fatigue and sleep disturbances, likely treatment-related. - Proceed with cycle 4 of chemoradiation. - Discuss with pharmacy to  reduce steroid dose to help with sleep disturbances.  Radiation-induced esophagitis Experiencing burning sensation in throat and esophagus, consistent with radiation-induced esophagitis. Symptoms include sore throat and severe heartburn, exacerbated by ongoing radiation therapy. - Prescribe Carafate to coat the esophagus. - Prescribe viscous lidocaine  for symptomatic relief.  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 Currently using Benadryl , but taking excessive doses (100 mg) with variable effectiveness. Sleep disturbances are significant, impacting daily functioning. - Discuss with pharmacy to reduce steroid dose. - Recommend trying melatonin as an alternative sleep aid. - Advise against taking excessive doses of Benadryl . He was advised to call immediately if he has any other concerning symptoms in the interval.  The patient voices understanding of current disease status and treatment options and is in agreement with the current care plan.  All questions were answered. The patient knows to call the clinic with any problems, questions or concerns. We can certainly see the patient much sooner if necessary. The total time spent in the appointment was 30 minutes including review of chart and various tests results, discussions about plan of care and coordination of care plan .  Disclaimer: This note was dictated with voice recognition software. Similar sounding words can inadvertently be transcribed and may not be corrected upon review.

## 2024-04-29 ENCOUNTER — Ambulatory Visit
Admission: RE | Admit: 2024-04-29 | Discharge: 2024-04-29 | Disposition: A | Source: Ambulatory Visit | Attending: Radiation Oncology

## 2024-04-29 ENCOUNTER — Other Ambulatory Visit: Payer: Self-pay

## 2024-04-29 DIAGNOSIS — Z510 Encounter for antineoplastic radiation therapy: Secondary | ICD-10-CM | POA: Diagnosis not present

## 2024-04-29 LAB — RAD ONC ARIA SESSION SUMMARY
Course Elapsed Days: 15
Plan Fractions Treated to Date: 12
Plan Prescribed Dose Per Fraction: 2 Gy
Plan Total Fractions Prescribed: 30
Plan Total Prescribed Dose: 60 Gy
Reference Point Dosage Given to Date: 24 Gy
Reference Point Session Dosage Given: 2 Gy
Session Number: 12

## 2024-04-30 ENCOUNTER — Other Ambulatory Visit: Payer: Self-pay

## 2024-04-30 ENCOUNTER — Ambulatory Visit
Admission: RE | Admit: 2024-04-30 | Discharge: 2024-04-30 | Disposition: A | Discharge status: Home / Self Care | Source: Ambulatory Visit | Attending: Radiation Oncology | Admitting: Radiation Oncology

## 2024-04-30 DIAGNOSIS — Z510 Encounter for antineoplastic radiation therapy: Secondary | ICD-10-CM | POA: Diagnosis not present

## 2024-04-30 LAB — RAD ONC ARIA SESSION SUMMARY
Course Elapsed Days: 16
Plan Fractions Treated to Date: 13
Plan Prescribed Dose Per Fraction: 2 Gy
Plan Total Fractions Prescribed: 30
Plan Total Prescribed Dose: 60 Gy
Reference Point Dosage Given to Date: 26 Gy
Reference Point Session Dosage Given: 2 Gy
Session Number: 13

## 2024-05-01 ENCOUNTER — Ambulatory Visit
Admission: RE | Admit: 2024-05-01 | Discharge: 2024-05-01 | Disposition: A | Discharge status: Home / Self Care | Source: Ambulatory Visit | Attending: Radiation Oncology | Admitting: Radiation Oncology

## 2024-05-01 ENCOUNTER — Telehealth: Payer: Self-pay | Admitting: Acute Care

## 2024-05-01 ENCOUNTER — Other Ambulatory Visit: Payer: Self-pay | Admitting: Acute Care

## 2024-05-01 ENCOUNTER — Other Ambulatory Visit: Payer: Self-pay

## 2024-05-01 DIAGNOSIS — Z510 Encounter for antineoplastic radiation therapy: Secondary | ICD-10-CM | POA: Diagnosis not present

## 2024-05-01 DIAGNOSIS — J449 Chronic obstructive pulmonary disease, unspecified: Secondary | ICD-10-CM

## 2024-05-01 LAB — RAD ONC ARIA SESSION SUMMARY
Course Elapsed Days: 17
Plan Fractions Treated to Date: 14
Plan Prescribed Dose Per Fraction: 2 Gy
Plan Total Fractions Prescribed: 30
Plan Total Prescribed Dose: 60 Gy
Reference Point Dosage Given to Date: 28 Gy
Reference Point Session Dosage Given: 2 Gy
Session Number: 14

## 2024-05-01 MED ORDER — IPRATROPIUM BROMIDE 0.02 % IN SOLN: 0.5000 mg | Freq: Four times a day (QID) | RESPIRATORY_TRACT | 12 refills | Status: AC

## 2024-05-01 MED ORDER — ARFORMOTEROL TARTRATE 15 MCG/2ML IN NEBU: 15.0000 ug | INHALATION_SOLUTION | Freq: Two times a day (BID) | RESPIRATORY_TRACT | 6 refills | Status: AC

## 2024-05-01 MED ORDER — BUDESONIDE 0.25 MG/2ML IN SUSP: 0.2500 mg | Freq: Two times a day (BID) | RESPIRATORY_TRACT | 2 refills | Status: AC

## 2024-05-01 NOTE — Telephone Encounter (Signed)
 Patient requesting nebulizer treatment as due to radiation inhaler make mouth burn and irritate his throat

## 2024-05-02 ENCOUNTER — Ambulatory Visit
Admission: RE | Admit: 2024-05-02 | Discharge: 2024-05-02 | Disposition: A | Discharge status: Home / Self Care | Source: Ambulatory Visit | Attending: Radiation Oncology | Admitting: Radiation Oncology

## 2024-05-02 ENCOUNTER — Other Ambulatory Visit: Payer: Self-pay | Admitting: Radiation Oncology

## 2024-05-02 ENCOUNTER — Other Ambulatory Visit: Payer: Self-pay

## 2024-05-02 DIAGNOSIS — Z510 Encounter for antineoplastic radiation therapy: Secondary | ICD-10-CM | POA: Diagnosis not present

## 2024-05-02 LAB — RAD ONC ARIA SESSION SUMMARY
Course Elapsed Days: 18
Plan Fractions Treated to Date: 15
Plan Prescribed Dose Per Fraction: 2 Gy
Plan Total Fractions Prescribed: 30
Plan Total Prescribed Dose: 60 Gy
Reference Point Dosage Given to Date: 30 Gy
Reference Point Session Dosage Given: 2 Gy
Session Number: 15

## 2024-05-02 MED ORDER — SUCRALFATE 1 G PO TABS: 1.0000 g | ORAL_TABLET | Freq: Four times a day (QID) | ORAL | 2 refills | Status: DC

## 2024-05-05 ENCOUNTER — Ambulatory Visit

## 2024-05-05 ENCOUNTER — Inpatient Hospital Stay

## 2024-05-05 ENCOUNTER — Encounter: Payer: Self-pay | Admitting: Internal Medicine

## 2024-05-05 VITALS — BP 103/65 | HR 79 | Temp 97.8°F | Resp 18 | Wt 179.0 lb

## 2024-05-05 DIAGNOSIS — Z510 Encounter for antineoplastic radiation therapy: Secondary | ICD-10-CM | POA: Diagnosis not present

## 2024-05-05 DIAGNOSIS — C3412 Malignant neoplasm of upper lobe, left bronchus or lung: Secondary | ICD-10-CM

## 2024-05-05 LAB — CBC WITH DIFFERENTIAL (CANCER CENTER ONLY)
Abs Immature Granulocytes: 0.02 K/uL (ref 0.00–0.07)
Basophils Absolute: 0 K/uL (ref 0.0–0.1)
Basophils Relative: 1 %
Eosinophils Absolute: 0 K/uL (ref 0.0–0.5)
Eosinophils Relative: 0 %
HCT: 36.2 % — ABNORMAL LOW (ref 39.0–52.0)
Hemoglobin: 12.4 g/dL — ABNORMAL LOW (ref 13.0–17.0)
Immature Granulocytes: 1 %
Lymphocytes Relative: 32 %
Lymphs Abs: 0.9 K/uL (ref 0.7–4.0)
MCH: 29.5 pg (ref 26.0–34.0)
MCHC: 34.3 g/dL (ref 30.0–36.0)
MCV: 86.2 fL (ref 80.0–100.0)
Monocytes Absolute: 0.2 K/uL (ref 0.1–1.0)
Monocytes Relative: 8 %
Neutro Abs: 1.7 K/uL (ref 1.7–7.7)
Neutrophils Relative %: 58 %
Platelet Count: 98 K/uL — ABNORMAL LOW (ref 150–400)
RBC: 4.2 MIL/uL — ABNORMAL LOW (ref 4.22–5.81)
RDW: 13.2 % (ref 11.5–15.5)
WBC Count: 2.9 K/uL — ABNORMAL LOW (ref 4.0–10.5)
nRBC: 0 % (ref 0.0–0.2)

## 2024-05-05 LAB — CMP (CANCER CENTER ONLY)
ALT: 19 U/L (ref 0–44)
AST: 19 U/L (ref 15–41)
Albumin: 3.8 g/dL (ref 3.5–5.0)
Alkaline Phosphatase: 107 U/L (ref 38–126)
Anion gap: 6 (ref 5–15)
BUN: 26 mg/dL — ABNORMAL HIGH (ref 8–23)
CO2: 26 mmol/L (ref 22–32)
Calcium: 9.2 mg/dL (ref 8.9–10.3)
Chloride: 103 mmol/L (ref 98–111)
Creatinine: 0.97 mg/dL (ref 0.61–1.24)
GFR, Estimated: >60 mL/min (ref >60)
Glucose, Bld: 101 mg/dL — ABNORMAL HIGH (ref 70–99)
Potassium: 4.2 mmol/L (ref 3.5–5.1)
Sodium: 135 mmol/L (ref 135–145)
Total Bilirubin: 0.9 mg/dL (ref 0.0–1.2)
Total Protein: 7.2 g/dL (ref 6.5–8.1)

## 2024-05-05 MED ORDER — SODIUM CHLORIDE 0.9 % IV SOLN: INTRAVENOUS | Status: DC

## 2024-05-05 NOTE — Progress Notes (Signed)
 Based on patient stated symptoms,his manual BP is 78/68 and his platelets are 8- Dr. Sherrod decided to hold treatment for today and give IVF.   Patient feels somewhat dizzy and has not been able to eat or drink a lot. He also says he is having issues swallowing and is more SOB sitting up than when he reclines. Significant pain in his throat/chest area related to radiation that isn't really relieved for any significant period of time even with lidocaine . He has had diarrhea 3-4 times/day for the last 3-4 days in a row.

## 2024-05-05 NOTE — Patient Instructions (Signed)

## 2024-05-06 ENCOUNTER — Encounter: Payer: Self-pay | Admitting: Internal Medicine

## 2024-05-06 ENCOUNTER — Emergency Department (HOSPITAL_COMMUNITY)
Admission: EM | Admit: 2024-05-06 | Discharge: 2024-05-06 | Disposition: A | Discharge status: Home / Self Care | Source: Home / Self Care | Attending: Emergency Medicine | Admitting: Emergency Medicine

## 2024-05-06 ENCOUNTER — Emergency Department (HOSPITAL_COMMUNITY)

## 2024-05-06 ENCOUNTER — Other Ambulatory Visit: Payer: Self-pay

## 2024-05-06 ENCOUNTER — Ambulatory Visit
Admission: RE | Admit: 2024-05-06 | Discharge: 2024-05-06 | Disposition: A | Discharge status: Home / Self Care | Source: Ambulatory Visit | Attending: Radiation Oncology | Admitting: Radiation Oncology

## 2024-05-06 ENCOUNTER — Encounter (HOSPITAL_COMMUNITY): Payer: Self-pay | Admitting: Radiology

## 2024-05-06 ENCOUNTER — Other Ambulatory Visit: Payer: Self-pay | Admitting: Physician Assistant

## 2024-05-06 DIAGNOSIS — K219 Gastro-esophageal reflux disease without esophagitis: Secondary | ICD-10-CM

## 2024-05-06 DIAGNOSIS — E861 Hypovolemia: Secondary | ICD-10-CM | POA: Insufficient documentation

## 2024-05-06 DIAGNOSIS — C3490 Malignant neoplasm of unspecified part of unspecified bronchus or lung: Secondary | ICD-10-CM | POA: Insufficient documentation

## 2024-05-06 DIAGNOSIS — Z7982 Long term (current) use of aspirin: Secondary | ICD-10-CM | POA: Insufficient documentation

## 2024-05-06 DIAGNOSIS — K209 Esophagitis, unspecified without bleeding: Secondary | ICD-10-CM | POA: Insufficient documentation

## 2024-05-06 DIAGNOSIS — R0602 Shortness of breath: Secondary | ICD-10-CM | POA: Insufficient documentation

## 2024-05-06 DIAGNOSIS — I959 Hypotension, unspecified: Secondary | ICD-10-CM | POA: Insufficient documentation

## 2024-05-06 DIAGNOSIS — R1032 Left lower quadrant pain: Secondary | ICD-10-CM | POA: Diagnosis not present

## 2024-05-06 DIAGNOSIS — K208 Other esophagitis without bleeding: Secondary | ICD-10-CM

## 2024-05-06 DIAGNOSIS — R0600 Dyspnea, unspecified: Secondary | ICD-10-CM | POA: Diagnosis not present

## 2024-05-06 DIAGNOSIS — K760 Fatty (change of) liver, not elsewhere classified: Secondary | ICD-10-CM | POA: Diagnosis not present

## 2024-05-06 DIAGNOSIS — Z510 Encounter for antineoplastic radiation therapy: Secondary | ICD-10-CM | POA: Diagnosis not present

## 2024-05-06 DIAGNOSIS — N400 Enlarged prostate without lower urinary tract symptoms: Secondary | ICD-10-CM | POA: Diagnosis not present

## 2024-05-06 DIAGNOSIS — R918 Other nonspecific abnormal finding of lung field: Secondary | ICD-10-CM | POA: Diagnosis not present

## 2024-05-06 LAB — CBC WITH DIFFERENTIAL/PLATELET
Abs Immature Granulocytes: 0 K/uL (ref 0.00–0.07)
Basophils Absolute: 0 K/uL (ref 0.0–0.1)
Basophils Relative: 1 %
Eosinophils Absolute: 0 K/uL (ref 0.0–0.5)
Eosinophils Relative: 0 %
HCT: 39.8 % (ref 39.0–52.0)
Hemoglobin: 12.8 g/dL — ABNORMAL LOW (ref 13.0–17.0)
Immature Granulocytes: 0 %
Lymphocytes Relative: 23 %
Lymphs Abs: 0.6 K/uL — ABNORMAL LOW (ref 0.7–4.0)
MCH: 28.8 pg (ref 26.0–34.0)
MCHC: 32.2 g/dL (ref 30.0–36.0)
MCV: 89.6 fL (ref 80.0–100.0)
Monocytes Absolute: 0.3 K/uL (ref 0.1–1.0)
Monocytes Relative: 11 %
Neutro Abs: 1.7 K/uL (ref 1.7–7.7)
Neutrophils Relative %: 65 %
Platelets: 86 K/uL — ABNORMAL LOW (ref 150–400)
RBC: 4.44 MIL/uL (ref 4.22–5.81)
RDW: 13.3 % (ref 11.5–15.5)
Smear Review: NORMAL
WBC: 2.6 K/uL — ABNORMAL LOW (ref 4.0–10.5)
nRBC: 0 % (ref 0.0–0.2)

## 2024-05-06 LAB — RAD ONC ARIA SESSION SUMMARY
Course Elapsed Days: 22
Plan Fractions Treated to Date: 16
Plan Prescribed Dose Per Fraction: 2 Gy
Plan Total Fractions Prescribed: 30
Plan Total Prescribed Dose: 60 Gy
Reference Point Dosage Given to Date: 32 Gy
Reference Point Session Dosage Given: 2 Gy
Session Number: 16

## 2024-05-06 LAB — COMPREHENSIVE METABOLIC PANEL WITH GFR
ALT: 20 U/L (ref 0–44)
AST: 23 U/L (ref 15–41)
Albumin: 4.1 g/dL (ref 3.5–5.0)
Alkaline Phosphatase: 142 U/L — ABNORMAL HIGH (ref 38–126)
Anion gap: 12 (ref 5–15)
BUN: 22 mg/dL (ref 8–23)
CO2: 23 mmol/L (ref 22–32)
Calcium: 9.4 mg/dL (ref 8.9–10.3)
Chloride: 102 mmol/L (ref 98–111)
Creatinine, Ser: 0.95 mg/dL (ref 0.61–1.24)
GFR, Estimated: >60 mL/min (ref >60)
Glucose, Bld: 84 mg/dL (ref 70–99)
Potassium: 4.6 mmol/L (ref 3.5–5.1)
Sodium: 137 mmol/L (ref 135–145)
Total Bilirubin: 0.9 mg/dL (ref 0.0–1.2)
Total Protein: 7.5 g/dL (ref 6.5–8.1)

## 2024-05-06 LAB — PRO BRAIN NATRIURETIC PEPTIDE: Pro Brain Natriuretic Peptide: 92.2 pg/mL (ref <300.0)

## 2024-05-06 LAB — TROPONIN T, HIGH SENSITIVITY
Troponin T High Sensitivity: 23 ng/L — ABNORMAL HIGH (ref 0–19)
Troponin T High Sensitivity: 20 ng/L — ABNORMAL HIGH (ref 0–19)

## 2024-05-06 LAB — LIPASE, BLOOD: Lipase: 21 U/L (ref 11–51)

## 2024-05-06 MED ORDER — FENTANYL CITRATE (PF) 50 MCG/ML IJ SOSY
50.0000 ug | PREFILLED_SYRINGE | Freq: Once | INTRAMUSCULAR | Status: AC
  Administered 2024-05-06: 50 ug via INTRAVENOUS
  Filled 2024-05-06: qty 1

## 2024-05-06 MED ORDER — HYDROCODONE-ACETAMINOPHEN 5-325 MG PO TABS
1.0000 | ORAL_TABLET | Freq: Once | ORAL | Status: AC
  Administered 2024-05-06: 1 via ORAL
  Filled 2024-05-06: qty 1

## 2024-05-06 MED ORDER — LIDOCAINE VISCOUS HCL 2 % MT SOLN
15.0000 mL | Freq: Once | OROMUCOSAL | Status: AC
Start: 2024-05-06 — End: 2024-05-06
  Administered 2024-05-06: 15 mL via OROMUCOSAL
  Filled 2024-05-06: qty 15

## 2024-05-06 MED ORDER — HYDROCODONE-ACETAMINOPHEN 5-325 MG PO TABS: 1.0000 | ORAL_TABLET | ORAL | 0 refills | Status: DC | PRN

## 2024-05-06 MED ORDER — LACTATED RINGERS IV BOLUS
1000.0000 mL | Freq: Once | INTRAVENOUS | Status: AC
  Administered 2024-05-06: 1000 mL via INTRAVENOUS

## 2024-05-06 MED ORDER — PANTOPRAZOLE SODIUM 40 MG IV SOLR
40.0000 mg | Freq: Once | INTRAVENOUS | Status: AC
  Administered 2024-05-06: 40 mg via INTRAVENOUS
  Filled 2024-05-06: qty 10

## 2024-05-06 MED ORDER — MAGIC MOUTHWASH
5.0000 mL | Freq: Once | ORAL | Status: AC
  Administered 2024-05-06: 5 mL via ORAL
  Filled 2024-05-06: qty 5

## 2024-05-06 MED ORDER — IOHEXOL 300 MG/ML  SOLN
100.0000 mL | Freq: Once | INTRAMUSCULAR | Status: AC | PRN
  Administered 2024-05-06: 100 mL via INTRAVENOUS

## 2024-05-06 NOTE — ED Notes (Signed)
 1 set of blood cultures sent Lavendar and Light green sent  Dark green in lab if needed.

## 2024-05-06 NOTE — Discharge Instructions (Signed)
 We are providing you with hydrocodone  which is a narcotic pain medicine.  Do not combine this medication with alcohol or other medications.  Your blood pressure was low today, cut your lisinopril  in half from 10 mg to 5 mg starting tomorrow.  Talk to your oncologist, Dr. Gatha, about further treatments for radiation esophagitis.  For now, you can increase your omeprazole  from 20 mg to 40 mg.  Return to the ER for any new or worsening symptoms.

## 2024-05-06 NOTE — ED Triage Notes (Signed)
 Pt has lung cancer and has had worsening shortness of breath since Monday. He was unable to get his Monday treatment due to low BP. He received fluid at that time. He has been unable to eat or drink in the past 4 days. He also endorses vomiting and diarrhea. Denies fever that he is aware of.

## 2024-05-06 NOTE — ED Provider Notes (Signed)
 [place] EMERGENCY DEPARTMENT AT [facility] Provider Note   CSN: [id number] Arrival date & time: 05/06/24  1514     Patient presents with: Hypotension and Dehydration   Charles Grimes is a 68 y.o. male.   HPI 68 year old male with a history of lung cancer currently getting radiation and chemotherapy presents with hypotension and poor p.o. intake.  For the past 3 to 4 days he has been having significant burning in his chest that limits how much he can eat and drink due to the pain.  He is able to swallow but is very painful.  Has been trying the lidocaine  prescribed but it has not helped.  He states that this pain causes him to be short of breath, similar to when he was here a little over a week ago.  No fevers.  He has had vomiting and diarrhea since yesterday.  He received fluids yesterday but no cancer treatment given hypotension.  He has continued taking his blood pressure medicine, which on chart review appears to be lisinopril .  No leg swelling.  He has been having some abdominal pain, primarily left-sided today.  Prior to Admission medications   Medication Sig Start Date End Date Taking? Authorizing Provider  arformoterol (BROVANA) 15 MCG/2ML NEBU Take 2 mLs (15 mcg total) by nebulization 2 (two) times daily. 05/01/24   Ruthell Lauraine FALCON, NP  budesonide (PULMICORT) 0.25 MG/2ML nebulizer solution Take 2 mLs (0.25 mg total) by nebulization 2 (two) times daily. 05/01/24 05/01/25  Ruthell Lauraine FALCON, NP  HYDROcodone -acetaminophen  (NORCO/VICODIN) 5-325 MG tablet Take 1 tablet by mouth every 4 (four) hours as needed. 05/06/24  Yes Freddi Hamilton, MD  ipratropium (ATROVENT) 0.02 % nebulizer solution Take 2.5 mLs (0.5 mg total) by nebulization 4 (four) times daily. 05/01/24   Ruthell Lauraine FALCON, NP  albuterol  (PROVENTIL ) (2.5 MG/3ML) 0.083% nebulizer solution Take 3 mLs (2.5 mg total) by nebulization every 6 (six) hours as needed for wheezing or shortness of breath. 04/27/24   Melvenia Motto, MD   albuterol  (VENTOLIN  HFA) 108 (90 Base) MCG/ACT inhaler Inhale 2 puffs into the lungs every 6 (six) hours as needed for wheezing or shortness of breath. 02/25/24   Ruthell Lauraine FALCON, NP  ALPRAZolam  (XANAX ) 0.5 MG tablet Take 1 tablet (0.5 mg total) by mouth at bedtime as needed for anxiety. 10/24/23   [name], DO  aspirin  EC 81 MG tablet Take 81 mg by mouth daily.    [provider]  atorvastatin  (LIPITOR) 10 MG tablet Take 1 tablet (10 mg total) by mouth daily. 10/24/23   [name], DO  budesonide-glycopyrrolate -formoterol (BREZTRI  AEROSPHERE) 160-9-4.8 MCG/ACT AERO inhaler Inhale 2 puffs into the lungs in the morning and at bedtime. 02/25/24   Ruthell Lauraine FALCON, NP  budesonide-glycopyrrolate -formoterol (BREZTRI  AEROSPHERE) 160-9-4.8 MCG/ACT AERO inhaler Inhale 2 puffs into the lungs in the morning and at bedtime. 02/25/24   Ruthell Lauraine FALCON, NP  clotrimazole  (MYCELEX ) 10 MG troche Take 1 tablet (10 mg total) by mouth 3 (three) times daily. 02/25/24   Ruthell Lauraine FALCON, NP  colchicine  0.6 MG tablet Take 1 tablet (0.6 mg total) by mouth daily as needed (gout flares). 02/19/24   Shelah Lamar RAMAN, MD  docusate sodium  (COLACE) 100 MG capsule Take 1 capsule (100 mg total) by mouth every 12 (twelve) hours. 04/14/24   Mannie Pac T, DO  lidocaine  (XYLOCAINE ) 2 % solution Use as directed 15 mLs in the mouth or throat every 6 (six) hours as needed  for mouth pain. 04/28/24   Sherrod Sherrod, MD  lidocaine -prilocaine  (EMLA ) cream Apply to affected area once 03/26/24   Sherrod Sherrod, MD  lisinopril  (ZESTRIL ) 10 MG tablet Take 1 tablet (10 mg total) by mouth daily. 01/14/24   [name], DO  metoCLOPramide  (REGLAN ) 10 MG tablet Take 1 tablet (10 mg total) by mouth every 8 (eight) hours as needed for nausea. 04/27/24   Melvenia Motto, MD  Multiple Vitamins-Minerals (CENTRUM SILVER 50+MEN) TABS Take 1 tablet by mouth daily.    [provider]  nitroGLYCERIN  (NITROSTAT ) 0.4  MG SL tablet Place 1 tablet (0.4 mg total) under the tongue every 5 (five) minutes as needed for chest pain. 02/08/21   [name], DO  NON FORMULARY Pt uses a c-pap nightly    [provider]  omeprazole  (PRILOSEC ) 20 MG capsule Take 1 capsule (20 mg total) by mouth daily. 01/14/24   [name], DO  ondansetron  (ZOFRAN ) 8 MG tablet Take 1 tablet (8 mg total) by mouth every 8 (eight) hours as needed for nausea or vomiting. Start on the third day after chemotherapy. 03/26/24   Sherrod Sherrod, MD  polyethylene glycol powder (MIRALAX ) 17 GM/SCOOP powder Take 17 g by mouth daily. Dissolve 1 capful (17g) in 4-8 ounces of liquid and take by mouth daily. 04/14/24   Mannie Fairy DASEN, DO  prochlorperazine  (COMPAZINE ) 10 MG tablet Take 1 tablet (10 mg total) by mouth every 6 (six) hours as needed for nausea or vomiting. 03/26/24   Sherrod Sherrod, MD  sertraline  (ZOLOFT ) 50 MG tablet Take 2 tabs in the morning and 1 tab in the evening. 01/14/24   [name], DO  Spacer/Aero-Holding Chambers (AEROCHAMBER PLUS) Device Per patient request to help with inhaler management and effectiveness. 04/28/24   Ruthell Lauraine FALCON, NP  sucralfate (CARAFATE) 1 g tablet Take 1 tablet (1 g total) by mouth 4 (four) times daily. Dissolve each tablet in 15 cc water before use. 05/02/24   Dewey Rush, MD  tiZANidine  (ZANAFLEX ) 4 MG tablet Take 1 tablet (4 mg total) by mouth at bedtime as needed for muscle spasms. 01/14/24   [name], DO  triamcinolone  cream (KENALOG ) 0.1 % Apply 1 Application topically 2 (two) times daily as needed (itch). 11/12/23   [name], DO    Allergies: Ibuprofen, Isosorbide  nitrate, Oxycodone , and Penicillins    Review of Systems  HENT:  Positive for trouble swallowing.   Respiratory:  Positive for shortness of breath.   Cardiovascular:  Positive for chest pain.  Gastrointestinal:  Positive for abdominal pain, diarrhea and vomiting.   Neurological:  Positive for weakness.    Updated Vital Signs BP 116/74   Pulse 72   Temp 98.5 F (36.9 C) (Oral)   Resp 16   Ht 5' 11 (1.803 m)   Wt 81.2 kg   SpO2 98%   BMI 24.97 kg/m   Physical Exam Vitals and nursing note reviewed.  Constitutional:      General: He is not in acute distress.    Appearance: He is well-developed. He is not ill-appearing or diaphoretic.  HENT:     Head: Normocephalic and atraumatic.  Cardiovascular:     Rate and Rhythm: Normal rate and regular rhythm.     Heart sounds: Normal heart sounds.  Pulmonary:     Effort: Pulmonary effort is normal.     Breath sounds: Normal breath sounds.  Abdominal:     General: There is no distension.  Palpations: Abdomen is soft.     Tenderness: There is no abdominal tenderness.  Musculoskeletal:     Right lower leg: No edema.     Left lower leg: No edema.  Skin:    General: Skin is warm and dry.  Neurological:     Mental Status: He is alert.     (all labs ordered are listed, but only abnormal results are displayed) Labs Reviewed  COMPREHENSIVE METABOLIC PANEL WITH GFR - Abnormal; Notable for the following components:      Result Value   Alkaline Phosphatase 142 (*)    All other components within normal limits  CBC WITH DIFFERENTIAL/PLATELET - Abnormal; Notable for the following components:   WBC 2.6 (*)    Hemoglobin 12.8 (*)    Platelets 86 (*)    Lymphs Abs 0.6 (*)    All other components within normal limits  TROPONIN T, HIGH SENSITIVITY - Abnormal; Notable for the following components:   Troponin T High Sensitivity 23 (*)    All other components within normal limits  TROPONIN T, HIGH SENSITIVITY - Abnormal; Notable for the following components:   Troponin T High Sensitivity 20 (*)    All other components within normal limits  PRO BRAIN NATRIURETIC PEPTIDE  LIPASE, BLOOD    EKG: EKG Interpretation Date/Time:  Tuesday May 06 2024 15:54:52 EDT Ventricular Rate:  87 PR  Interval:  141 QRS Duration:  103 QT Interval:  377 QTC Calculation: 454 R Axis:   74  Text Interpretation: Sinus rhythm Baseline wander in lead(s) V3 V4 V5 no acute ST/T changes similar to Apr 27 2024 Confirmed by Freddi Hamilton [phone]) on 05/06/2024 4:54:10 PM  Radiology: CT ABDOMEN PELVIS W CONTRAST Result Date: 05/06/2024 CLINICAL DATA:  Left lower quadrant abdominal pain. EXAM: CT ABDOMEN AND PELVIS WITH CONTRAST TECHNIQUE: Multidetector CT imaging of the abdomen and pelvis was performed using the standard protocol following bolus administration of intravenous contrast. RADIATION DOSE REDUCTION: This exam was performed according to the departmental dose-optimization program which includes automated exposure control, adjustment of the mA and/or kV according to patient size and/or use of iterative reconstruction technique. CONTRAST:  OMNIPAQUE  IOHEXOL  300 MG/ML  SOLN COMPARISON:  April 14, 2024 FINDINGS: Lower chest: Emphysematous lung disease and chronic areas of scarring and/or atelectasis are noted within the bilateral lung bases. Hepatobiliary: There is diffuse fatty infiltration of the liver parenchyma. No focal liver abnormality is seen. No gallstones, gallbladder wall thickening, or biliary dilatation. Pancreas: Unremarkable. No pancreatic ductal dilatation or surrounding inflammatory changes. Spleen: Normal in size without focal abnormality. Adrenals/Urinary Tract: Adrenal glands are unremarkable. Kidneys are normal, without renal calculi, focal lesion, or hydronephrosis. Bladder is unremarkable. Stomach/Bowel: Stomach is within normal limits. Appendix appears normal. No evidence of bowel wall thickening, distention, or inflammatory changes. Vascular/Lymphatic: Aortic atherosclerosis. No enlarged abdominal or pelvic lymph nodes. Reproductive: Prostate gland is mildly enlarged. Other: No abdominal wall hernia or abnormality. No abdominopelvic ascites. Musculoskeletal: A chronic  fracture deformity is seen at the level of L4. Multilevel degenerative changes are present throughout the lumbar spine. IMPRESSION: 1. No acute or active process within the abdomen or pelvis. 2. Hepatic steatosis. 3. Emphysematous lung disease and chronic areas of scarring and/or atelectasis within the bilateral lung bases. 4. Aortic atherosclerosis. Electronically Signed   By: Suzen Dials M.D.   On: 05/06/2024 19:10   DG Chest Portable 1 View Result Date: 05/06/2024 CLINICAL DATA:  Dyspnea EXAM: PORTABLE CHEST 1 VIEW COMPARISON:  04/27/2024, chest CT  04/27/2024, PET CT 02/18/2024 FINDINGS: Right-sided central venous port tip at the SVC. Left hilar/suprahilar mass again noted. No acute airspace disease or effusion. Normal cardiac size. No pneumothorax IMPRESSION: No active disease. Left hilar/suprahilar mass again noted. Electronically Signed   By: Luke Bun M.D.   On: 05/06/2024 17:27     Procedures   Medications Ordered in the ED  lactated ringers  bolus 1,000 mL (0 mLs Intravenous Stopped 05/06/24 1801)  magic mouthwash (5 mLs Oral Given 05/06/24 1716)  lidocaine  (XYLOCAINE ) 2 % viscous mouth solution 15 mL (15 mLs Mouth/Throat Given 05/06/24 1653)  iohexol  (OMNIPAQUE ) 300 MG/ML solution 100 mL (100 mLs Intravenous Contrast Given 05/06/24 1837)  pantoprazole  (PROTONIX ) injection 40 mg (40 mg Intravenous Given 05/06/24 1904)  fentaNYL  (SUBLIMAZE ) injection 50 mcg (50 mcg Intravenous Given 05/06/24 1904)  HYDROcodone -acetaminophen  (NORCO/VICODIN) 5-325 MG per tablet 1 tablet (1 tablet Oral Given 05/06/24 2015)                                    Medical Decision Making Amount and/or Complexity of Data Reviewed Labs: ordered.    Details: Minimal elevation of troponin but flat. Radiology: ordered and independent interpretation performed.    Details: No bowel obstruction or diverticulitis ECG/medicine tests: ordered and independent interpretation performed.    Details: No  ischemia  Risk Prescription drug management.   Patient's chest pain is consistent with radiation esophagitis, similar to when he was in the ER a little over a week ago.  The shortness of breath resolved with treatment of his esophagitis and his pain got a lot better.  Has started to come back some, will give some hydrocodone  in addition to the treatments he already received.  However with him getting a negative CTA for PE last time and this being very similar I do not think further similar workup is needed.  His vomiting/diarrhea is probably cancer treatment related, CT is benign.  No urinary symptoms.  He was given some fluids and his hypotension has resolved.  I suspect his hypotension is from poor p.o. intake in the setting of his esophagitis in addition to continuing his 10 mg lisinopril .  I have discussed with him and family about cutting his lisinopril  in half.  Will have him increase his omeprazole  at home and have him call his oncologist in the morning for further instructions and potential change in treatment for his esophagitis.  Otherwise, no urinary symptoms or obvious signs of infection at this time.  Appears stable for discharge home with return precautions.     Final diagnoses:  Hypotension due to hypovolemia  Radiation esophagitis    ED Discharge Orders          Ordered    HYDROcodone -acetaminophen  (NORCO/VICODIN) 5-325 MG tablet  Every 4 hours PRN        05/06/24 2018               Freddi Hamilton, MD 05/06/24 2029

## 2024-05-07 ENCOUNTER — Other Ambulatory Visit: Payer: Self-pay

## 2024-05-07 ENCOUNTER — Ambulatory Visit
Admission: RE | Admit: 2024-05-07 | Discharge: 2024-05-07 | Disposition: A | Source: Ambulatory Visit | Attending: Radiation Oncology

## 2024-05-07 DIAGNOSIS — Z510 Encounter for antineoplastic radiation therapy: Secondary | ICD-10-CM | POA: Diagnosis not present

## 2024-05-07 LAB — RAD ONC ARIA SESSION SUMMARY
Course Elapsed Days: 23
Plan Fractions Treated to Date: 17
Plan Prescribed Dose Per Fraction: 2 Gy
Plan Total Fractions Prescribed: 30
Plan Total Prescribed Dose: 60 Gy
Reference Point Dosage Given to Date: 34 Gy
Reference Point Session Dosage Given: 2 Gy
Session Number: 17

## 2024-05-08 ENCOUNTER — Ambulatory Visit
Admission: RE | Admit: 2024-05-08 | Discharge: 2024-05-08 | Disposition: A | Discharge status: Home / Self Care | Source: Ambulatory Visit | Attending: Radiation Oncology | Admitting: Radiation Oncology

## 2024-05-08 ENCOUNTER — Other Ambulatory Visit: Payer: Self-pay

## 2024-05-08 DIAGNOSIS — Z510 Encounter for antineoplastic radiation therapy: Secondary | ICD-10-CM | POA: Diagnosis not present

## 2024-05-08 LAB — RAD ONC ARIA SESSION SUMMARY
Course Elapsed Days: 24
Plan Fractions Treated to Date: 18
Plan Prescribed Dose Per Fraction: 2 Gy
Plan Total Fractions Prescribed: 30
Plan Total Prescribed Dose: 60 Gy
Reference Point Dosage Given to Date: 36 Gy
Reference Point Session Dosage Given: 2 Gy
Session Number: 18

## 2024-05-09 ENCOUNTER — Ambulatory Visit
Admission: RE | Admit: 2024-05-09 | Discharge: 2024-05-09 | Disposition: A | Discharge status: Home / Self Care | Source: Ambulatory Visit | Attending: Radiation Oncology | Admitting: Radiation Oncology

## 2024-05-09 ENCOUNTER — Other Ambulatory Visit: Payer: Self-pay

## 2024-05-09 DIAGNOSIS — Z510 Encounter for antineoplastic radiation therapy: Secondary | ICD-10-CM | POA: Diagnosis not present

## 2024-05-09 LAB — RAD ONC ARIA SESSION SUMMARY
Course Elapsed Days: 25
Plan Fractions Treated to Date: 19
Plan Prescribed Dose Per Fraction: 2 Gy
Plan Total Fractions Prescribed: 30
Plan Total Prescribed Dose: 60 Gy
Reference Point Dosage Given to Date: 38 Gy
Reference Point Session Dosage Given: 2 Gy
Session Number: 19

## 2024-05-12 ENCOUNTER — Other Ambulatory Visit: Payer: Self-pay

## 2024-05-12 ENCOUNTER — Inpatient Hospital Stay: Admitting: Internal Medicine

## 2024-05-12 ENCOUNTER — Inpatient Hospital Stay

## 2024-05-12 ENCOUNTER — Ambulatory Visit
Admission: RE | Admit: 2024-05-12 | Discharge: 2024-05-12 | Disposition: A | Discharge status: Home / Self Care | Source: Ambulatory Visit | Attending: Radiation Oncology | Admitting: Radiation Oncology

## 2024-05-12 VITALS — BP 89/62 | HR 68 | Temp 97.0°F | Resp 17 | Ht 71.0 in | Wt 180.0 lb

## 2024-05-12 DIAGNOSIS — C3412 Malignant neoplasm of upper lobe, left bronchus or lung: Secondary | ICD-10-CM

## 2024-05-12 DIAGNOSIS — Z510 Encounter for antineoplastic radiation therapy: Secondary | ICD-10-CM | POA: Diagnosis not present

## 2024-05-12 LAB — CBC WITH DIFFERENTIAL (CANCER CENTER ONLY)
Abs Immature Granulocytes: 0.01 K/uL (ref 0.00–0.07)
Basophils Absolute: 0 K/uL (ref 0.0–0.1)
Basophils Relative: 1 %
Eosinophils Absolute: 0 K/uL (ref 0.0–0.5)
Eosinophils Relative: 0 %
HCT: 31.5 % — ABNORMAL LOW (ref 39.0–52.0)
Hemoglobin: 10.6 g/dL — ABNORMAL LOW (ref 13.0–17.0)
Immature Granulocytes: 0 %
Lymphocytes Relative: 19 %
Lymphs Abs: 0.6 K/uL — ABNORMAL LOW (ref 0.7–4.0)
MCH: 29.9 pg (ref 26.0–34.0)
MCHC: 33.7 g/dL (ref 30.0–36.0)
MCV: 89 fL (ref 80.0–100.0)
Monocytes Absolute: 0.3 K/uL (ref 0.1–1.0)
Monocytes Relative: 12 %
Neutro Abs: 1.9 K/uL (ref 1.7–7.7)
Neutrophils Relative %: 68 %
Platelet Count: 78 K/uL — ABNORMAL LOW (ref 150–400)
RBC: 3.54 MIL/uL — ABNORMAL LOW (ref 4.22–5.81)
RDW: 14.5 % (ref 11.5–15.5)
WBC Count: 2.9 K/uL — ABNORMAL LOW (ref 4.0–10.5)
nRBC: 0 % (ref 0.0–0.2)

## 2024-05-12 LAB — RAD ONC ARIA SESSION SUMMARY
Course Elapsed Days: 28
Plan Fractions Treated to Date: 20
Plan Prescribed Dose Per Fraction: 2 Gy
Plan Total Fractions Prescribed: 30
Plan Total Prescribed Dose: 60 Gy
Reference Point Dosage Given to Date: 40 Gy
Reference Point Session Dosage Given: 2 Gy
Session Number: 20

## 2024-05-12 LAB — CMP (CANCER CENTER ONLY)
ALT: 14 U/L (ref 0–44)
AST: 14 U/L — ABNORMAL LOW (ref 15–41)
Albumin: 3.3 g/dL — ABNORMAL LOW (ref 3.5–5.0)
Alkaline Phosphatase: 94 U/L (ref 38–126)
Anion gap: 3 — ABNORMAL LOW (ref 5–15)
BUN: 19 mg/dL (ref 8–23)
CO2: 28 mmol/L (ref 22–32)
Calcium: 8.6 mg/dL — ABNORMAL LOW (ref 8.9–10.3)
Chloride: 108 mmol/L (ref 98–111)
Creatinine: 0.94 mg/dL (ref 0.61–1.24)
GFR, Estimated: >60 mL/min (ref >60)
Glucose, Bld: 86 mg/dL (ref 70–99)
Potassium: 4.2 mmol/L (ref 3.5–5.1)
Sodium: 139 mmol/L (ref 135–145)
Total Bilirubin: 0.4 mg/dL (ref 0.0–1.2)
Total Protein: 6.3 g/dL — ABNORMAL LOW (ref 6.5–8.1)

## 2024-05-12 MED ORDER — METHYLPREDNISOLONE 4 MG PO TBPK: ORAL_TABLET | ORAL | 0 refills | Status: DC

## 2024-05-12 MED ORDER — SODIUM CHLORIDE 0.9% FLUSH
10.0000 mL | INTRAVENOUS | Status: DC | PRN
  Administered 2024-05-12: 10 mL via INTRAVENOUS

## 2024-05-12 NOTE — Progress Notes (Signed)
 Nutrition  RD scheduled to see patient during infusion today however infusion cancelled.  Patient did not want to wait until scheduled RD appointment.  Nursing reported appetite increase and weight gain.    RD tried to call patient [phone]) but no answer.  Left message with RD call back number.   Rescheduled nutrition appointment for 11/3 during infusion  Charles Grimes B. Dasie SOLON, CSO, LDN Registered Dietitian [phone]

## 2024-05-12 NOTE — Progress Notes (Signed)
 [facility] [phone]   [phone]  OFFICE PROGRESS NOTE  Charles Mabel Mt, DO [address] [place] [address]  DIAGNOSIS: Stage IIIA (T3, N2, M0) non-small cell lung cancer, adenocarcinoma presented with left suprahilar mass in addition to suspicious subcarinal lymphadenopathy diagnosed in August 2025.   PRIOR THERAPY: None  CURRENT THERAPY: A course of concurrent chemoradiation with weekly carboplatin  for AUC 2 and paclitaxel  45 MGs/M2.  Status post 4 cycles.    INTERVAL HISTORY: Charles Grimes 68 y.o. male returns to the clinic today for follow-up visit accompanied by his son.Discussed the use of AI scribe software for clinical note transcription with the patient, who gave verbal consent to proceed.  History of Present Illness Charles Grimes is a 68 year old male with stage 3A non-small cell lung cancer and colon adenocarcinoma who presents for evaluation before starting cycle number five of chemoradiation. He is accompanied by his daughter-in-law.  He is currently undergoing concurrent chemoradiation with weekly carboplatin  and Paclitexol, having completed four cycles. He has no new complaints since the last visit, except for inflammation and esophagitis, which he attributes to radiation treatment.  He denies any issues with appetite, stating 'nothing tastes good,' but he has not lost weight and has gained a pound since the last visit, now weighing 180 pounds. He reports a metallic taste in his mouth, which affects his ability to enjoy food. He is currently eating chicken broth, grits, and milk, as these do not cause discomfort when swallowing.  His platelet count is low at 78,000.  No bleeding, bruising, nausea, vomiting, diarrhea, or headaches.    MEDICAL HISTORY: Past Medical History:  Diagnosis Date   Anginal pain    Anxiety    Arthritis    Coronary artery disease, non-occlusive 08/2013   Mild  to moderate (40% OM1) single vessel CAD. Otherwise nonobstructed.   Depression    Dyslipidemia, goal LDL below 100    Essential hypertension    Family history of premature CAD    GERD (gastroesophageal reflux disease)    H/O skin disorder    Involving hands. Subsequently resolved.    Nonischemic cardiomyopathy (HCC) 05/2013   Non-ischemic: EF ~45% by Echo & Myoview  --> Non-obstructive CAD   Obesity (BMI 30.0-34.9)    OSA (obstructive sleep apnea) 06/21/2017   uses CPAP   Sleep apnea     ALLERGIES:  is allergic to ibuprofen, isosorbide  nitrate, oxycodone , and penicillins.  MEDICATIONS:  Current Outpatient Medications  Medication Sig Dispense Refill   arformoterol (BROVANA) 15 MCG/2ML NEBU Take 2 mLs (15 mcg total) by nebulization 2 (two) times daily. 120 mL 6   budesonide (PULMICORT) 0.25 MG/2ML nebulizer solution Take 2 mLs (0.25 mg total) by nebulization 2 (two) times daily. 60 mL 2   ipratropium (ATROVENT) 0.02 % nebulizer solution Take 2.5 mLs (0.5 mg total) by nebulization 4 (four) times daily. 240 each 12   albuterol  (PROVENTIL ) (2.5 MG/3ML) 0.083% nebulizer solution Take 3 mLs (2.5 mg total) by nebulization every 6 (six) hours as needed for wheezing or shortness of breath. 75 mL 12   albuterol  (VENTOLIN  HFA) 108 (90 Base) MCG/ACT inhaler Inhale 2 puffs into the lungs every 6 (six) hours as needed for wheezing or shortness of breath. 8 g 2   ALPRAZolam  (XANAX ) 0.5 MG tablet Take 1 tablet (0.5 mg total) by mouth at bedtime as needed for anxiety. 30 tablet 0   aspirin  EC  81 MG tablet Take 81 mg by mouth daily.     atorvastatin  (LIPITOR) 10 MG tablet Take 1 tablet (10 mg total) by mouth daily. 90 tablet 3   budesonide-glycopyrrolate -formoterol (BREZTRI  AEROSPHERE) 160-9-4.8 MCG/ACT AERO inhaler Inhale 2 puffs into the lungs in the morning and at bedtime. 10.7 each 6   budesonide-glycopyrrolate -formoterol (BREZTRI  AEROSPHERE) 160-9-4.8 MCG/ACT AERO inhaler Inhale 2 puffs into the lungs  in the morning and at bedtime.     clotrimazole  (MYCELEX ) 10 MG troche Take 1 tablet (10 mg total) by mouth 3 (three) times daily. 21 Troche 0   colchicine  0.6 MG tablet Take 1 tablet (0.6 mg total) by mouth daily as needed (gout flares).     docusate sodium  (COLACE) 100 MG capsule Take 1 capsule (100 mg total) by mouth every 12 (twelve) hours. 60 capsule 0   HYDROcodone -acetaminophen  (NORCO/VICODIN) 5-325 MG tablet Take 1 tablet by mouth every 4 (four) hours as needed. 10 tablet 0   lidocaine  (XYLOCAINE ) 2 % solution Use as directed 15 mLs in the mouth or throat every 6 (six) hours as needed for mouth pain. 100 mL 2   lidocaine -prilocaine  (EMLA ) cream Apply to affected area once 30 g 3   lisinopril  (ZESTRIL ) 10 MG tablet Take 1 tablet (10 mg total) by mouth daily. 90 tablet 3   metoCLOPramide  (REGLAN ) 10 MG tablet Take 1 tablet (10 mg total) by mouth every 8 (eight) hours as needed for nausea. 30 tablet 0   Multiple Vitamins-Minerals (CENTRUM SILVER 50+MEN) TABS Take 1 tablet by mouth daily.     nitroGLYCERIN  (NITROSTAT ) 0.4 MG SL tablet Place 1 tablet (0.4 mg total) under the tongue every 5 (five) minutes as needed for chest pain. 30 tablet 1   NON FORMULARY Pt uses a c-pap nightly     omeprazole  (PRILOSEC ) 20 MG capsule Take 1 capsule (20 mg total) by mouth daily. 90 capsule 3   ondansetron  (ZOFRAN ) 8 MG tablet Take 1 tablet (8 mg total) by mouth every 8 (eight) hours as needed for nausea or vomiting. Start on the third day after chemotherapy. 30 tablet 1   polyethylene glycol powder (MIRALAX ) 17 GM/SCOOP powder Take 17 g by mouth daily. Dissolve 1 capful (17g) in 4-8 ounces of liquid and take by mouth daily. 238 g 0   prochlorperazine  (COMPAZINE ) 10 MG tablet Take 1 tablet (10 mg total) by mouth every 6 (six) hours as needed for nausea or vomiting. 30 tablet 1   sertraline  (ZOLOFT ) 50 MG tablet Take 2 tabs in the morning and 1 tab in the evening. 270 tablet 3   Spacer/Aero-Holding Chambers  (AEROCHAMBER PLUS) Device Per patient request to help with inhaler management and effectiveness. 1 each 0   sucralfate (CARAFATE) 1 g tablet Take 1 tablet (1 g total) by mouth 4 (four) times daily. Dissolve each tablet in 15 cc water before use. 120 tablet 2   tiZANidine  (ZANAFLEX ) 4 MG tablet Take 1 tablet (4 mg total) by mouth at bedtime as needed for muscle spasms. 30 tablet 2   triamcinolone  cream (KENALOG ) 0.1 % Apply 1 Application topically 2 (two) times daily as needed (itch). 30 g 1   No current facility-administered medications for this visit.    SURGICAL HISTORY:  Past Surgical History:  Procedure Laterality Date   COLONOSCOPY  2015   COLONOSCOPY WITH PROPOFOL   01/02/2017   Dr.Pyrtle   IR IMAGING GUIDED PORT INSERTION  04/02/2024   LEFT HEART CATHETERIZATION WITH CORONARY ANGIOGRAM  08/26/2013  Mild to moderate disease with 40-50% stenosis and OM1. Otherwise no significant CAD --  Surgeon: Alm LELON Clay, MD;  Location: [facility] CATH LAB;  Service: Cardiovascular;;   Lower extremity arterial Dopplers  06/11/2013   No occlusive disease   LUMBAR LAMINECTOMY/DECOMPRESSION MICRODISCECTOMY Right 10/07/2014   Procedure: LUMBAR LAMINECTOMY/DECOMPRESSION MICRODISCECTOMY 1 LEVEL;  Surgeon: Arley Helling, MD;  Location: [facility] NEURO ORS;  Service: Neurosurgery;  Laterality: Right;  Right L3-L4 Microdiscectomy   NM MYOVIEW  LTD  06/03/2013   Low risk study with no ischemia. EF roughly 44% with no regional wall motion abnormalities noted   PFTs  06/16/2013   Normal Volumes &  Spirometry; Moderately reduced DLCO   POLYPECTOMY     SHOULDER HEMI-ARTHROPLASTY Right 05/28/2015   Procedure: RIGHT SHOULDER HEMI-ARTHROPLASTY CTA HEAD AND SUBSCAP REPAIR ;  Surgeon: Marcey Her, MD;  Location: [facility] OR;  Service: Orthopedics;  Laterality: Right;   TRANSTHORACIC ECHOCARDIOGRAM  06/11/2013   Mildly reduced EF: 45-50%. Mild anterior hypokinesis with incoordinate septal motion. No evidence of pulmonary hypertension    VIDEO BRONCHOSCOPY WITH ENDOBRONCHIAL ULTRASOUND Left 02/19/2024   Procedure: BRONCHOSCOPY, WITH EBUS;  Surgeon: Shelah Lamar RAMAN, MD;  Location: [facility] ENDOSCOPY;  Service: Pulmonary;  Laterality: Left;    REVIEW OF SYSTEMS:  Constitutional: positive for anorexia and fatigue Eyes: negative Ears, nose, mouth, throat, and face: negative Respiratory: negative Cardiovascular: negative Gastrointestinal: negative Genitourinary:negative Integument/breast: negative Hematologic/lymphatic: negative Musculoskeletal:negative Neurological: negative Behavioral/Psych: negative Endocrine: negative Allergic/Immunologic: negative   PHYSICAL EXAMINATION: General appearance: alert, cooperative, fatigued, and no distress Head: Normocephalic, without obvious abnormality, atraumatic Neck: no adenopathy, no JVD, supple, symmetrical, trachea midline, and thyroid not enlarged, symmetric, no tenderness/mass/nodules Lymph nodes: Cervical, supraclavicular, and axillary nodes normal. Resp: clear to auscultation bilaterally Back: symmetric, no curvature. ROM normal. No CVA tenderness. Cardio: regular rate and rhythm, S1, S2 normal, no murmur, click, rub or gallop GI: soft, non-tender; bowel sounds normal; no masses,  no organomegaly Extremities: extremities normal, atraumatic, no cyanosis or edema Neurologic: Alert and oriented X 3, normal strength and tone. Normal symmetric reflexes. Normal coordination and gait  ECOG PERFORMANCE STATUS: 1 - Symptomatic but completely ambulatory  Blood pressure (!) 89/62, pulse 68, temperature (!) 97 F (36.1 C), temperature source Temporal, resp. rate 17, height 5' 11 (1.803 m), weight 180 lb (81.6 kg), SpO2 100%.  LABORATORY DATA: Lab Results  Component Value Date   WBC 2.9 (L) 05/12/2024   HGB 10.6 (L) 05/12/2024   HCT 31.5 (L) 05/12/2024   MCV 89.0 05/12/2024   PLT 78 (L) 05/12/2024      Chemistry      Component Value Date/Time   NA 137 05/06/2024 1611   K 4.6  05/06/2024 1611   CL 102 05/06/2024 1611   CO2 23 05/06/2024 1611   BUN 22 05/06/2024 1611   CREATININE 0.95 05/06/2024 1611   CREATININE 0.97 05/05/2024 0800   CREATININE 0.84 04/30/2015 1738      Component Value Date/Time   CALCIUM  9.4 05/06/2024 1611   ALKPHOS 142 (H) 05/06/2024 1611   AST 23 05/06/2024 1611   AST 19 05/05/2024 0800   ALT 20 05/06/2024 1611   ALT 19 05/05/2024 0800   BILITOT 0.9 05/06/2024 1611   BILITOT 0.9 05/05/2024 0800       RADIOGRAPHIC STUDIES: CT ABDOMEN PELVIS W CONTRAST Result Date: 05/06/2024 CLINICAL DATA:  Left lower quadrant abdominal pain. EXAM: CT ABDOMEN AND PELVIS WITH CONTRAST TECHNIQUE: Multidetector CT imaging of the abdomen and pelvis was performed using  the standard protocol following bolus administration of intravenous contrast. RADIATION DOSE REDUCTION: This exam was performed according to the departmental dose-optimization program which includes automated exposure control, adjustment of the mA and/or kV according to patient size and/or use of iterative reconstruction technique. CONTRAST:  OMNIPAQUE  IOHEXOL  300 MG/ML  SOLN COMPARISON:  April 14, 2024 FINDINGS: Lower chest: Emphysematous lung disease and chronic areas of scarring and/or atelectasis are noted within the bilateral lung bases. Hepatobiliary: There is diffuse fatty infiltration of the liver parenchyma. No focal liver abnormality is seen. No gallstones, gallbladder wall thickening, or biliary dilatation. Pancreas: Unremarkable. No pancreatic ductal dilatation or surrounding inflammatory changes. Spleen: Normal in size without focal abnormality. Adrenals/Urinary Tract: Adrenal glands are unremarkable. Kidneys are normal, without renal calculi, focal lesion, or hydronephrosis. Bladder is unremarkable. Stomach/Bowel: Stomach is within normal limits. Appendix appears normal. No evidence of bowel wall thickening, distention, or inflammatory changes. Vascular/Lymphatic: Aortic  atherosclerosis. No enlarged abdominal or pelvic lymph nodes. Reproductive: Prostate gland is mildly enlarged. Other: No abdominal wall hernia or abnormality. No abdominopelvic ascites. Musculoskeletal: A chronic fracture deformity is seen at the level of L4. Multilevel degenerative changes are present throughout the lumbar spine. IMPRESSION: 1. No acute or active process within the abdomen or pelvis. 2. Hepatic steatosis. 3. Emphysematous lung disease and chronic areas of scarring and/or atelectasis within the bilateral lung bases. 4. Aortic atherosclerosis. Electronically Signed   By: Suzen Dials M.D.   On: 05/06/2024 19:10   DG Chest Portable 1 View Result Date: 05/06/2024 CLINICAL DATA:  Dyspnea EXAM: PORTABLE CHEST 1 VIEW COMPARISON:  04/27/2024, chest CT 04/27/2024, PET CT 02/18/2024 FINDINGS: Right-sided central venous port tip at the SVC. Left hilar/suprahilar mass again noted. No acute airspace disease or effusion. Normal cardiac size. No pneumothorax IMPRESSION: No active disease. Left hilar/suprahilar mass again noted. Electronically Signed   By: Luke Bun M.D.   On: 05/06/2024 17:27   CT Angio Chest PE W and/or Wo Contrast Result Date: 04/27/2024 CLINICAL DATA:  Shortness of breath, known left hilar mass EXAM: CT ANGIOGRAPHY CHEST WITH CONTRAST TECHNIQUE: Multidetector CT imaging of the chest was performed using the standard protocol during bolus administration of intravenous contrast. Multiplanar CT image reconstructions and MIPs were obtained to evaluate the vascular anatomy. RADIATION DOSE REDUCTION: This exam was performed according to the departmental dose-optimization program which includes automated exposure control, adjustment of the mA and/or kV according to patient size and/or use of iterative reconstruction technique. CONTRAST:  75mL OMNIPAQUE  IOHEXOL  350 MG/ML SOLN COMPARISON:  PET-CT from 02/18/2024, chest x-ray from earlier in the same day. FINDINGS: Cardiovascular:  Atherosclerotic calcifications of the thoracic aorta are noted. No aneurysmal dilatation or dissection is seen. The pulmonary artery shows a normal branching pattern without intraluminal filling defect to suggest pulmonary embolism. Some attenuation of the left upper lobe arterial structures is noted secondary to the known left suprahilar mass. No cardiac enlargement is seen. Mediastinum/Nodes: Thoracic inlet is within normal limits. The esophagus is unremarkable. Left hilar mass/associated adenopathy consistent with the known history. Lungs/Pleura: Lungs are well aerated bilaterally. Diffuse emphysematous changes are noted. The known left hilar/suprahilar mass lesion is again identified with some attenuation of adjacent upper lobe pulmonary arterial branches. The lesion measures approximately 3.9 by 3.6 cm in greatest AP and transverse dimensions respectively. No other parenchymal nodule is seen. No sizable effusion is noted. Upper Abdomen: Visualized upper abdomen is within normal limits. Musculoskeletal: Degenerative changes of the thoracic spine are seen. Review of the MIP images  confirms the above findings. IMPRESSION: No evidence of pulmonary emboli. Stable appearing left hilar/suprahilar mass lesion consistent with neoplasm. No other focal abnormality is noted. Aortic Atherosclerosis (ICD10-I70.0) and Emphysema (ICD10-J43.9). Electronically Signed   By: Oneil Devonshire M.D.   On: 04/27/2024 21:29   DG Chest 2 View Result Date: 04/27/2024 CLINICAL DATA:  Chest pain shortness of breath EXAM: CHEST - 2 VIEW COMPARISON:  02/18/2024 PET-CT FINDINGS: Cardiac shadow is within normal limits. Right chest wall port is noted. Left hilar and suprahilar mass lesion is again identified and stable from prior PET-CT. No sizable effusion is noted. No bony abnormality is seen. IMPRESSION: Stable left hilar and suprahilar mass. Electronically Signed   By: Oneil Devonshire M.D.   On: 04/27/2024 19:30   CT ABDOMEN PELVIS WO  CONTRAST Result Date: 04/14/2024 EXAM: CT ABDOMEN AND PELVIS WITHOUT CONTRAST 04/14/2024 07:55:09 PM TECHNIQUE: CT of the abdomen and pelvis was performed without the administration of intravenous contrast. Multiplanar reformatted images are provided for review. Automated exposure control, iterative reconstruction, and/or weight-based adjustment of the mA/kV was utilized to reduce the radiation dose to as low as reasonably achievable. COMPARISON: CT abdomen and pelvis 05/17/2023. CLINICAL HISTORY: Bowel obstruction suspected. Per triage note: Pt arrives with c/o constipation. Pt reports that he has only had a small BM in the past 6 days since starting his chemo treatment last Wednesday. Pt denies fevers or n/v. Pt reports lower ABD pain. FINDINGS: LOWER CHEST: Emphysema in the lower lungs. No acute abnormality in the lower chest. LIVER: The liver is unremarkable. GALLBLADDER AND BILE DUCTS: Gallbladder is unremarkable. No biliary ductal dilatation. SPLEEN: No acute abnormality. PANCREAS: No acute abnormality. ADRENAL GLANDS: No acute abnormality. KIDNEYS, URETERS AND BLADDER: No stones in the kidneys or ureters. No hydronephrosis. No perinephric or periureteral stranding. Urinary bladder is unremarkable. GI AND BOWEL: Stomach demonstrates no acute abnormality. There is no bowel obstruction. Normal appendix. PERITONEUM AND RETROPERITONEUM: No ascites. No free air. VASCULATURE: Aorta is normal in caliber. Aortic atherosclerotic calcification. LYMPH NODES: No lymphadenopathy. REPRODUCTIVE ORGANS: No acute abnormality. BONES AND SOFT TISSUES: No acute osseous abnormality. No focal soft tissue abnormality. IMPRESSION: 1. No acute findings in the abdomen or pelvis. Electronically signed by: Norman Gatlin MD 04/14/2024 08:04 PM EDT RP Workstation: [id number]    ASSESSMENT AND PLAN: This is a very pleasant 68 years old white male with stage IIIa (T3, N2, M0) non-small cell lung cancer, adenocarcinoma presented with  left suprahilar mass in addition to suspicious subcarinal lymphadenopathy diagnosed in August 2025.  The patient is currently undergoing a course of concurrent chemoradiation with weekly carboplatin  for AUC of 2 and paclitaxel  45 MGs/M2 status post 4 cycles.  He has been tolerating the treatment fairly well except for fatigue and lack of appetite.  He also has chemotherapy-induced thrombocytopenia. Assessment and Plan Assessment & Plan Stage 3A non-small cell lung cancer Currently undergoing concurrent chemoradiation with weekly carboplatin  and baclitexol. Treatment is paused due to thrombocytopenia. Radiation therapy continues as the main treatment modality. Post-radiation, a scan will be conducted after a three-week recovery period to evaluate the need for further treatment, potentially transitioning to immunotherapy if the scan is favorable. - Continue radiation therapy - Pause chemotherapy until platelet count recovers to close to 100,000 - Plan for post-radiation scan after three weeks to evaluate treatment response - Consider transition to immunotherapy based on scan results  Thrombocytopenia secondary to chemoradiation Platelet count at 78,000, likely secondary to chemoradiation. Platelet count is lower than desired for chemotherapy  administration. - Monitor platelet count - Resume chemotherapy once platelet count recovers to close to 100,000  Radiation-induced esophagitis Causing discomfort with swallowing. Symptoms include burning sensation when swallowing. - Advise to avoid spicy foods and consume foods at room temperature  Dysgeusia secondary to chemoradiation Resulting in metallic taste and altered taste perception. Appetite is affected but weight is stable. - Prescribe Medrol  Dose Pack to be taken at home to help with taste alteration  He was advised to call immediately if he has any concerning symptoms in the interval. The patient voices understanding of current disease status  and treatment options and is in agreement with the current care plan.  All questions were answered. The patient knows to call the clinic with any problems, questions or concerns. We can certainly see the patient much sooner if necessary. The total time spent in the appointment was 30 minutes including review of chart and various tests results, discussions about plan of care and coordination of care plan .  Disclaimer: This note was dictated with voice recognition software. Similar sounding words can inadvertently be transcribed and may not be corrected upon review.

## 2024-05-13 ENCOUNTER — Other Ambulatory Visit: Payer: Self-pay

## 2024-05-13 ENCOUNTER — Ambulatory Visit
Admission: RE | Admit: 2024-05-13 | Discharge: 2024-05-13 | Disposition: A | Source: Ambulatory Visit | Attending: Radiation Oncology

## 2024-05-13 DIAGNOSIS — Z510 Encounter for antineoplastic radiation therapy: Secondary | ICD-10-CM | POA: Diagnosis not present

## 2024-05-13 LAB — RAD ONC ARIA SESSION SUMMARY
Course Elapsed Days: 29
Plan Fractions Treated to Date: 21
Plan Prescribed Dose Per Fraction: 2 Gy
Plan Total Fractions Prescribed: 30
Plan Total Prescribed Dose: 60 Gy
Reference Point Dosage Given to Date: 42 Gy
Reference Point Session Dosage Given: 2 Gy
Session Number: 21

## 2024-05-14 ENCOUNTER — Other Ambulatory Visit: Payer: Self-pay

## 2024-05-14 ENCOUNTER — Ambulatory Visit
Admission: RE | Admit: 2024-05-14 | Discharge: 2024-05-14 | Disposition: A | Discharge status: Home / Self Care | Source: Ambulatory Visit | Attending: Radiation Oncology | Admitting: Radiation Oncology

## 2024-05-14 DIAGNOSIS — Z510 Encounter for antineoplastic radiation therapy: Secondary | ICD-10-CM | POA: Diagnosis not present

## 2024-05-14 LAB — RAD ONC ARIA SESSION SUMMARY
Course Elapsed Days: 30
Plan Fractions Treated to Date: 22
Plan Prescribed Dose Per Fraction: 2 Gy
Plan Total Fractions Prescribed: 30
Plan Total Prescribed Dose: 60 Gy
Reference Point Dosage Given to Date: 44 Gy
Reference Point Session Dosage Given: 2 Gy
Session Number: 22

## 2024-05-15 ENCOUNTER — Other Ambulatory Visit: Payer: Self-pay

## 2024-05-15 ENCOUNTER — Ambulatory Visit
Admission: RE | Admit: 2024-05-15 | Discharge: 2024-05-15 | Disposition: A | Discharge status: Home / Self Care | Source: Ambulatory Visit | Attending: Radiation Oncology | Admitting: Radiation Oncology

## 2024-05-15 DIAGNOSIS — Z510 Encounter for antineoplastic radiation therapy: Secondary | ICD-10-CM | POA: Diagnosis not present

## 2024-05-15 LAB — RAD ONC ARIA SESSION SUMMARY
Course Elapsed Days: 31
Plan Fractions Treated to Date: 23
Plan Prescribed Dose Per Fraction: 2 Gy
Plan Total Fractions Prescribed: 30
Plan Total Prescribed Dose: 60 Gy
Reference Point Dosage Given to Date: 46 Gy
Reference Point Session Dosage Given: 2 Gy
Session Number: 23

## 2024-05-16 ENCOUNTER — Other Ambulatory Visit: Payer: Self-pay

## 2024-05-16 ENCOUNTER — Ambulatory Visit
Admission: RE | Admit: 2024-05-16 | Discharge: 2024-05-16 | Disposition: A | Discharge status: Home / Self Care | Source: Ambulatory Visit | Attending: Radiation Oncology | Admitting: Radiation Oncology

## 2024-05-16 DIAGNOSIS — Z510 Encounter for antineoplastic radiation therapy: Secondary | ICD-10-CM | POA: Diagnosis not present

## 2024-05-16 LAB — RAD ONC ARIA SESSION SUMMARY
Course Elapsed Days: 32
Plan Fractions Treated to Date: 24
Plan Prescribed Dose Per Fraction: 2 Gy
Plan Total Fractions Prescribed: 30
Plan Total Prescribed Dose: 60 Gy
Reference Point Dosage Given to Date: 48 Gy
Reference Point Session Dosage Given: 2 Gy
Session Number: 24

## 2024-05-19 ENCOUNTER — Encounter: Payer: Self-pay | Admitting: Internal Medicine

## 2024-05-19 ENCOUNTER — Inpatient Hospital Stay: Attending: Internal Medicine

## 2024-05-19 ENCOUNTER — Inpatient Hospital Stay

## 2024-05-19 ENCOUNTER — Ambulatory Visit
Admission: RE | Admit: 2024-05-19 | Discharge: 2024-05-19 | Disposition: A | Discharge status: Home / Self Care | Source: Ambulatory Visit | Attending: Radiation Oncology | Admitting: Radiation Oncology

## 2024-05-19 ENCOUNTER — Other Ambulatory Visit: Payer: Self-pay

## 2024-05-19 VITALS — BP 107/71 | HR 64 | Temp 98.0°F | Resp 16 | Wt 180.8 lb

## 2024-05-19 DIAGNOSIS — K5900 Constipation, unspecified: Secondary | ICD-10-CM | POA: Insufficient documentation

## 2024-05-19 DIAGNOSIS — Z7982 Long term (current) use of aspirin: Secondary | ICD-10-CM | POA: Insufficient documentation

## 2024-05-19 DIAGNOSIS — D6959 Other secondary thrombocytopenia: Secondary | ICD-10-CM | POA: Insufficient documentation

## 2024-05-19 DIAGNOSIS — R0789 Other chest pain: Secondary | ICD-10-CM | POA: Insufficient documentation

## 2024-05-19 DIAGNOSIS — Z510 Encounter for antineoplastic radiation therapy: Secondary | ICD-10-CM | POA: Insufficient documentation

## 2024-05-19 DIAGNOSIS — Z5111 Encounter for antineoplastic chemotherapy: Secondary | ICD-10-CM | POA: Insufficient documentation

## 2024-05-19 DIAGNOSIS — E861 Hypovolemia: Secondary | ICD-10-CM | POA: Insufficient documentation

## 2024-05-19 DIAGNOSIS — K208 Other esophagitis without bleeding: Secondary | ICD-10-CM | POA: Insufficient documentation

## 2024-05-19 DIAGNOSIS — R0602 Shortness of breath: Secondary | ICD-10-CM | POA: Insufficient documentation

## 2024-05-19 DIAGNOSIS — Z79899 Other long term (current) drug therapy: Secondary | ICD-10-CM | POA: Insufficient documentation

## 2024-05-19 DIAGNOSIS — Z7951 Long term (current) use of inhaled steroids: Secondary | ICD-10-CM | POA: Insufficient documentation

## 2024-05-19 DIAGNOSIS — I10 Essential (primary) hypertension: Secondary | ICD-10-CM | POA: Insufficient documentation

## 2024-05-19 DIAGNOSIS — D696 Thrombocytopenia, unspecified: Secondary | ICD-10-CM | POA: Insufficient documentation

## 2024-05-19 DIAGNOSIS — C3412 Malignant neoplasm of upper lobe, left bronchus or lung: Secondary | ICD-10-CM | POA: Insufficient documentation

## 2024-05-19 DIAGNOSIS — I959 Hypotension, unspecified: Secondary | ICD-10-CM | POA: Insufficient documentation

## 2024-05-19 DIAGNOSIS — R1310 Dysphagia, unspecified: Secondary | ICD-10-CM | POA: Insufficient documentation

## 2024-05-19 LAB — CMP (CANCER CENTER ONLY)
ALT: 12 U/L (ref 0–44)
AST: 13 U/L — ABNORMAL LOW (ref 15–41)
Albumin: 3.3 g/dL — ABNORMAL LOW (ref 3.5–5.0)
Alkaline Phosphatase: 87 U/L (ref 38–126)
Anion gap: 5 (ref 5–15)
BUN: 16 mg/dL (ref 8–23)
CO2: 27 mmol/L (ref 22–32)
Calcium: 8.4 mg/dL — ABNORMAL LOW (ref 8.9–10.3)
Chloride: 108 mmol/L (ref 98–111)
Creatinine: 0.94 mg/dL (ref 0.61–1.24)
GFR, Estimated: >60 mL/min (ref >60)
Glucose, Bld: 85 mg/dL (ref 70–99)
Potassium: 3.8 mmol/L (ref 3.5–5.1)
Sodium: 140 mmol/L (ref 135–145)
Total Bilirubin: 0.4 mg/dL (ref 0.0–1.2)
Total Protein: 6.3 g/dL — ABNORMAL LOW (ref 6.5–8.1)

## 2024-05-19 LAB — CBC WITH DIFFERENTIAL (CANCER CENTER ONLY)
Abs Immature Granulocytes: 0.01 K/uL (ref 0.00–0.07)
Basophils Absolute: 0 K/uL (ref 0.0–0.1)
Basophils Relative: 0 %
Eosinophils Absolute: 0 K/uL (ref 0.0–0.5)
Eosinophils Relative: 1 %
HCT: 31.5 % — ABNORMAL LOW (ref 39.0–52.0)
Hemoglobin: 10.7 g/dL — ABNORMAL LOW (ref 13.0–17.0)
Immature Granulocytes: 0 %
Lymphocytes Relative: 11 %
Lymphs Abs: 0.5 K/uL — ABNORMAL LOW (ref 0.7–4.0)
MCH: 30.4 pg (ref 26.0–34.0)
MCHC: 34 g/dL (ref 30.0–36.0)
MCV: 89.5 fL (ref 80.0–100.0)
Monocytes Absolute: 0.3 K/uL (ref 0.1–1.0)
Monocytes Relative: 6 %
Neutro Abs: 4 K/uL (ref 1.7–7.7)
Neutrophils Relative %: 82 %
Platelet Count: 85 K/uL — ABNORMAL LOW (ref 150–400)
RBC: 3.52 MIL/uL — ABNORMAL LOW (ref 4.22–5.81)
RDW: 16 % — ABNORMAL HIGH (ref 11.5–15.5)
WBC Count: 4.9 K/uL (ref 4.0–10.5)
nRBC: 0 % (ref 0.0–0.2)

## 2024-05-19 LAB — RAD ONC ARIA SESSION SUMMARY
Course Elapsed Days: 35
Plan Fractions Treated to Date: 25
Plan Prescribed Dose Per Fraction: 2 Gy
Plan Total Fractions Prescribed: 30
Plan Total Prescribed Dose: 60 Gy
Reference Point Dosage Given to Date: 50 Gy
Reference Point Session Dosage Given: 2 Gy
Session Number: 25

## 2024-05-19 MED ORDER — SODIUM CHLORIDE 0.9 % IV SOLN
216.2000 mg | Freq: Once | INTRAVENOUS | Status: AC
  Administered 2024-05-19: 220 mg via INTRAVENOUS
  Filled 2024-05-19: qty 22

## 2024-05-19 MED ORDER — SODIUM CHLORIDE 0.9 % IV SOLN
45.0000 mg/m2 | Freq: Once | INTRAVENOUS | Status: AC
  Administered 2024-05-19: 96 mg via INTRAVENOUS
  Filled 2024-05-19: qty 16

## 2024-05-19 MED ORDER — DIPHENHYDRAMINE HCL 50 MG/ML IJ SOLN
50.0000 mg | Freq: Once | INTRAMUSCULAR | Status: AC
  Administered 2024-05-19: 50 mg via INTRAVENOUS
  Filled 2024-05-19: qty 1

## 2024-05-19 MED ORDER — PALONOSETRON HCL INJECTION 0.25 MG/5ML
0.2500 mg | Freq: Once | INTRAVENOUS | Status: AC
  Administered 2024-05-19: 0.25 mg via INTRAVENOUS
  Filled 2024-05-19: qty 5

## 2024-05-19 MED ORDER — FAMOTIDINE IN NACL 20-0.9 MG/50ML-% IV SOLN
20.0000 mg | Freq: Once | INTRAVENOUS | Status: AC
  Administered 2024-05-19: 20 mg via INTRAVENOUS
  Filled 2024-05-19: qty 50

## 2024-05-19 MED ORDER — SODIUM CHLORIDE 0.9 % IV SOLN: INTRAVENOUS | Status: DC

## 2024-05-19 NOTE — Progress Notes (Signed)
 Nutrition Assessment   Reason for Assessment:   Patient identified on Malnutrition Screening report.     ASSESSMENT:  68 year old male with stage III non-small cell lung cancer. History of colon cancer, HLD, GERD, CAD, HTN.  Patient receiving concurrent chemotherapy (carbo taxol  and radiation.   Met with patient during infusion.  Reports that he is taking lidocaine  prior to eating and this is helping him get food down without pain.  Yesterday drank 2 ensure shakes, 2 bowls of grits and ate salad with cheese for dinner.  Taste has gotten better.     Medications: reglan , zofran , lidocaine , carafate   Labs: reviewed   Anthropometrics:   Height: 71 inches Weight: 180 lb 190 lb on 9/29 BMI: 25  5% weight loss in the last month, concerning   Estimated Energy Needs  Kcals: 2000-2400 Protein: 100-120 g Fluid: 2000 ml   NUTRITION DIAGNOSIS: Unintentional weight loss related to cancer and related treatment side effects as evidenced by 5% weight loss in 1 month and decreased intake.     INTERVENTION:  Encouraged 350 calorie shake or higher.  Names of higher calorie shake provided along with coupons. Consume 2-3 times a day Discussed soft, moist, high calorie, high protein foods.  List provided Contact information provided   MONITORING, EVALUATION, GOAL: weight trends, intake   Next Visit: as needed  Johnette Teigen B. Dasie SOLON, CSO, LDN Registered Dietitian [phone]

## 2024-05-19 NOTE — Patient Instructions (Signed)
 [facility] - A DEPT OF [facility]  Discharge Instructions: Thank you for choosing [facility] to provide your oncology and hematology care.   If you have a lab appointment with the Cancer Center, please go directly to the Cancer Center and check in at the registration area.   Wear comfortable clothing and clothing appropriate for easy access to any Portacath or PICC line.   We strive to give you quality time with your provider. You may need to reschedule your appointment if you arrive late (15 or more minutes).  Arriving late affects you and other patients whose appointments are after yours.  Also, if you miss three or more appointments without notifying the office, you may be dismissed from the clinic at the provider's discretion.      For prescription refill requests, have your pharmacy contact our office and allow 72 hours for refills to be completed.    Today you received the following chemotherapy and/or immunotherapy agents: Paclitaxel , Carboplatin .       To help prevent nausea and vomiting after your treatment, we encourage you to take your nausea medication as directed.  BELOW ARE SYMPTOMS THAT SHOULD BE REPORTED IMMEDIATELY: *FEVER GREATER THAN 100.4 F (38 C) OR HIGHER *CHILLS OR SWEATING *NAUSEA AND VOMITING THAT IS NOT CONTROLLED WITH YOUR NAUSEA MEDICATION *UNUSUAL SHORTNESS OF BREATH *UNUSUAL BRUISING OR BLEEDING *URINARY PROBLEMS (pain or burning when urinating, or frequent urination) *BOWEL PROBLEMS (unusual diarrhea, constipation, pain near the anus) TENDERNESS IN MOUTH AND THROAT WITH OR WITHOUT PRESENCE OF ULCERS (sore throat, sores in mouth, or a toothache) UNUSUAL RASH, SWELLING OR PAIN  UNUSUAL VAGINAL DISCHARGE OR ITCHING   Items with * indicate a potential emergency and should be followed up as soon as possible or go to the Emergency Department if any problems should occur.  Please show the CHEMOTHERAPY ALERT CARD  or IMMUNOTHERAPY ALERT CARD at check-in to the Emergency Department and triage nurse.  Should you have questions after your visit or need to cancel or reschedule your appointment, please contact [facility] - A DEPT OF [facility]  Dept: [phone]  and follow the prompts.  Office hours are 8:00 a.m. to 4:30 p.m. Monday - Friday. Please note that voicemails left after 4:00 p.m. may not be returned until the following business day.  We are closed weekends and major holidays. You have access to a nurse at all times for urgent questions. Please call the main number to the clinic Dept: [phone] and follow the prompts.   For any non-urgent questions, you may also contact your provider using MyChart. We now offer e-Visits for anyone 64 and older to request care online for non-urgent symptoms. For details visit [email or web address].   Also download the MyChart app! Go to the app store, search MyChart, open the app, select [employer], and log in with your MyChart username and password.

## 2024-05-20 ENCOUNTER — Other Ambulatory Visit: Payer: Self-pay

## 2024-05-20 ENCOUNTER — Ambulatory Visit
Admission: RE | Admit: 2024-05-20 | Discharge: 2024-05-20 | Disposition: A | Discharge status: Home / Self Care | Source: Ambulatory Visit | Attending: Radiation Oncology | Admitting: Radiation Oncology

## 2024-05-20 DIAGNOSIS — Z510 Encounter for antineoplastic radiation therapy: Secondary | ICD-10-CM | POA: Diagnosis not present

## 2024-05-20 LAB — RAD ONC ARIA SESSION SUMMARY
Course Elapsed Days: 36
Plan Fractions Treated to Date: 26
Plan Prescribed Dose Per Fraction: 2 Gy
Plan Total Fractions Prescribed: 30
Plan Total Prescribed Dose: 60 Gy
Reference Point Dosage Given to Date: 52 Gy
Reference Point Session Dosage Given: 2 Gy
Session Number: 26

## 2024-05-21 ENCOUNTER — Ambulatory Visit
Admission: RE | Admit: 2024-05-21 | Discharge: 2024-05-21 | Disposition: A | Discharge status: Home / Self Care | Source: Ambulatory Visit | Attending: Radiation Oncology | Admitting: Radiation Oncology

## 2024-05-21 ENCOUNTER — Other Ambulatory Visit: Payer: Self-pay

## 2024-05-21 DIAGNOSIS — Z510 Encounter for antineoplastic radiation therapy: Secondary | ICD-10-CM | POA: Diagnosis not present

## 2024-05-21 LAB — RAD ONC ARIA SESSION SUMMARY
Course Elapsed Days: 37
Plan Fractions Treated to Date: 27
Plan Prescribed Dose Per Fraction: 2 Gy
Plan Total Fractions Prescribed: 30
Plan Total Prescribed Dose: 60 Gy
Reference Point Dosage Given to Date: 54 Gy
Reference Point Session Dosage Given: 2 Gy
Session Number: 27

## 2024-05-22 ENCOUNTER — Other Ambulatory Visit: Payer: Self-pay

## 2024-05-22 ENCOUNTER — Ambulatory Visit
Admission: RE | Admit: 2024-05-22 | Discharge: 2024-05-22 | Disposition: A | Discharge status: Home / Self Care | Source: Ambulatory Visit | Attending: Radiation Oncology | Admitting: Radiation Oncology

## 2024-05-22 DIAGNOSIS — Z510 Encounter for antineoplastic radiation therapy: Secondary | ICD-10-CM | POA: Diagnosis not present

## 2024-05-22 LAB — RAD ONC ARIA SESSION SUMMARY
Course Elapsed Days: 38
Plan Fractions Treated to Date: 28
Plan Prescribed Dose Per Fraction: 2 Gy
Plan Total Fractions Prescribed: 30
Plan Total Prescribed Dose: 60 Gy
Reference Point Dosage Given to Date: 56 Gy
Reference Point Session Dosage Given: 2 Gy
Session Number: 28

## 2024-05-23 ENCOUNTER — Other Ambulatory Visit: Payer: Self-pay

## 2024-05-23 ENCOUNTER — Ambulatory Visit
Admission: RE | Admit: 2024-05-23 | Discharge: 2024-05-23 | Disposition: A | Discharge status: Home / Self Care | Source: Ambulatory Visit | Attending: Radiation Oncology | Admitting: Radiation Oncology

## 2024-05-23 ENCOUNTER — Ambulatory Visit

## 2024-05-23 DIAGNOSIS — Z510 Encounter for antineoplastic radiation therapy: Secondary | ICD-10-CM | POA: Diagnosis not present

## 2024-05-23 LAB — RAD ONC ARIA SESSION SUMMARY
Course Elapsed Days: 39
Plan Fractions Treated to Date: 29
Plan Prescribed Dose Per Fraction: 2 Gy
Plan Total Fractions Prescribed: 30
Plan Total Prescribed Dose: 60 Gy
Reference Point Dosage Given to Date: 58 Gy
Reference Point Session Dosage Given: 2 Gy
Session Number: 29

## 2024-05-23 NOTE — Progress Notes (Signed)
 [facility] OFFICE PROGRESS NOTE  [name], DO [address] [place] [address]  DIAGNOSIS: Stage IIIA (T3, N2, M0) non-small cell lung cancer, adenocarcinoma presented with left suprahilar mass in addition to suspicious subcarinal lymphadenopathy diagnosed in August 2025.   PRIOR THERAPY: None  CURRENT THERAPY: A course of concurrent chemoradiation with weekly carboplatin  for AUC 2 and paclitaxel  45 MGs/M2.  Status post 5 cycles.    INTERVAL HISTORY: Charles Grimes 68 y.o. male returns today for a follow-up visit accompanied by his son.  The patient was last seen in clinic by Dr. Sherrod on 05/12/2024.  The patient is currently undergoing concurrent chemoradiation for stage III lung cancer.  His last day radiation is later this week on 05/29/2024.  He is status post 5 weeks of treatment and has been tolerating it fairly well.   2 weeks of his treatment were canceled secondary to thrombocytopenia.  The patient is hopeful to receive his last cycle of treatment today.  His last radiation is scheduled for later this week on 05/29/2024.   He does have some dysphagia.  He has been consuming Ensure shakes and he had boiled eggs this morning to maintain his nutrition.  He also uses Hycet for pain prior to eating which has been helpful.  He had lost some weight in October but his weight has stabilized recently.  He reports he is only been sick once with treatment.  He does have some alopecia which he is not concerned about.  His antiemetics are effective.  He is shortness of breath with exertion which he feels is about at his baseline.  He has some occasional intermittent cough produces a small amount of phlegm that is stable.  When this occurs he takes NyQuil which helps.  He denies any hemoptysis.  He sometimes experiences right lateral rib soreness.  He denies any traumas or injuries to this area.  Tylenol  provides some relief.  He has a history of a  cyst removal in that same area about 6 months ago.  He has occasional constipation, which he manages with a stool softener. Constipation usually occurs around chemotherapy sessions, but he states he has this under control at this time. He is not drinking enough water.  His EF is 55 to 60%.  His blood pressure is low today.  Prior to taking his lisinopril  his blood pressure was 119 systolic.  Has a blood pressure cuff at home.  He has seen a member of the nutritionist team in the past.  He is here today for evaluation and repeat blood work before undergoing the next cycle of treatment.  MEDICAL HISTORY: Past Medical History:  Diagnosis Date   Anginal pain    Anxiety    Arthritis    Coronary artery disease, non-occlusive 08/2013   Mild to moderate (40% OM1) single vessel CAD. Otherwise nonobstructed.   Depression    Dyslipidemia, goal LDL below 100    Essential hypertension    Family history of premature CAD    GERD (gastroesophageal reflux disease)    H/O skin disorder    Involving hands. Subsequently resolved.    Nonischemic cardiomyopathy (HCC) 05/2013   Non-ischemic: EF ~45% by Echo & Myoview  --> Non-obstructive CAD   Obesity (BMI 30.0-34.9)    OSA (obstructive sleep apnea) 06/21/2017   uses CPAP   Sleep apnea     ALLERGIES:  is allergic to ibuprofen, isosorbide  nitrate, oxycodone , and penicillins.  MEDICATIONS:  Current  Outpatient Medications  Medication Sig Dispense Refill   arformoterol (BROVANA) 15 MCG/2ML NEBU Take 2 mLs (15 mcg total) by nebulization 2 (two) times daily. 120 mL 6   budesonide (PULMICORT) 0.25 MG/2ML nebulizer solution Take 2 mLs (0.25 mg total) by nebulization 2 (two) times daily. 60 mL 2   ipratropium (ATROVENT) 0.02 % nebulizer solution Take 2.5 mLs (0.5 mg total) by nebulization 4 (four) times daily. 240 each 12   albuterol  (PROVENTIL ) (2.5 MG/3ML) 0.083% nebulizer solution Take 3 mLs (2.5 mg total) by nebulization every 6 (six) hours as needed for  wheezing or shortness of breath. 75 mL 12   albuterol  (VENTOLIN  HFA) 108 (90 Base) MCG/ACT inhaler Inhale 2 puffs into the lungs every 6 (six) hours as needed for wheezing or shortness of breath. 8 g 2   ALPRAZolam  (XANAX ) 0.5 MG tablet Take 1 tablet (0.5 mg total) by mouth at bedtime as needed for anxiety. 30 tablet 0   aspirin  EC 81 MG tablet Take 81 mg by mouth daily.     atorvastatin  (LIPITOR) 10 MG tablet Take 1 tablet (10 mg total) by mouth daily. 90 tablet 3   budesonide-glycopyrrolate -formoterol (BREZTRI  AEROSPHERE) 160-9-4.8 MCG/ACT AERO inhaler Inhale 2 puffs into the lungs in the morning and at bedtime. 10.7 each 6   budesonide-glycopyrrolate -formoterol (BREZTRI  AEROSPHERE) 160-9-4.8 MCG/ACT AERO inhaler Inhale 2 puffs into the lungs in the morning and at bedtime.     clotrimazole  (MYCELEX ) 10 MG troche Take 1 tablet (10 mg total) by mouth 3 (three) times daily. 21 Troche 0   colchicine  0.6 MG tablet Take 1 tablet (0.6 mg total) by mouth daily as needed (gout flares).     docusate sodium  (COLACE) 100 MG capsule Take 1 capsule (100 mg total) by mouth every 12 (twelve) hours. 60 capsule 0   HYDROcodone -acetaminophen  (NORCO/VICODIN) 5-325 MG tablet Take 1 tablet by mouth every 6 (six) hours as needed for moderate pain (pain score 4-6). 25 tablet 0   lidocaine  (XYLOCAINE ) 2 % solution Use as directed 15 mLs in the mouth or throat every 6 (six) hours as needed for mouth pain. 100 mL 2   lidocaine -prilocaine  (EMLA ) cream Apply to affected area once 30 g 3   lisinopril  (ZESTRIL ) 10 MG tablet Take 1 tablet (10 mg total) by mouth daily. 90 tablet 3   methylPREDNISolone  (MEDROL  DOSEPAK) 4 MG TBPK tablet Use as instructed 21 tablet 0   metoCLOPramide  (REGLAN ) 10 MG tablet Take 1 tablet (10 mg total) by mouth every 8 (eight) hours as needed for nausea. 30 tablet 0   Multiple Vitamins-Minerals (CENTRUM SILVER 50+MEN) TABS Take 1 tablet by mouth daily.     nitroGLYCERIN  (NITROSTAT ) 0.4 MG SL tablet  Place 1 tablet (0.4 mg total) under the tongue every 5 (five) minutes as needed for chest pain. 30 tablet 1   NON FORMULARY Pt uses a c-pap nightly     omeprazole  (PRILOSEC ) 20 MG capsule Take 1 capsule (20 mg total) by mouth daily. 90 capsule 3   ondansetron  (ZOFRAN ) 8 MG tablet Take 1 tablet (8 mg total) by mouth every 8 (eight) hours as needed for nausea or vomiting. Start on the third day after chemotherapy. 30 tablet 1   polyethylene glycol powder (MIRALAX ) 17 GM/SCOOP powder Take 17 g by mouth daily. Dissolve 1 capful (17g) in 4-8 ounces of liquid and take by mouth daily. 238 g 0   prochlorperazine  (COMPAZINE ) 10 MG tablet Take 1 tablet (10 mg total) by mouth every 6 (six)  hours as needed for nausea or vomiting. 30 tablet 1   sertraline  (ZOLOFT ) 50 MG tablet Take 2 tabs in the morning and 1 tab in the evening. 270 tablet 3   Spacer/Aero-Holding Chambers (AEROCHAMBER PLUS) Device Per patient request to help with inhaler management and effectiveness. 1 each 0   sucralfate (CARAFATE) 1 g tablet Take 1 tablet (1 g total) by mouth 4 (four) times daily. Dissolve each tablet in 15 cc water before use. 120 tablet 2   tiZANidine  (ZANAFLEX ) 4 MG tablet Take 1 tablet (4 mg total) by mouth at bedtime as needed for muscle spasms. 30 tablet 2   triamcinolone  cream (KENALOG ) 0.1 % Apply 1 Application topically 2 (two) times daily as needed (itch). 30 g 1   No current facility-administered medications for this visit.   Facility-Administered Medications Ordered in Other Visits  Medication Dose Route Frequency Provider Last Rate Last Admin   0.9 %  sodium chloride  infusion   Intravenous Continuous Sherrod Sherrod, MD       CARBOplatin  (PARAPLATIN ) 220 mg in sodium chloride  0.9 % 100 mL chemo infusion  220 mg Intravenous Once Mohamed, Mohamed, MD       diphenhydrAMINE  (BENADRYL ) injection 50 mg  50 mg Intravenous Once Mohamed, Mohamed, MD       famotidine  (PEPCID ) IVPB 20 mg premix  20 mg Intravenous Once  Mohamed, Mohamed, MD       PACLitaxel  (TAXOL ) 96 mg in sodium chloride  0.9 % 250 mL chemo infusion (</= 80mg /m2)  45 mg/m2 (Treatment Plan Recorded) Intravenous Once Sherrod Sherrod, MD       palonosetron  (ALOXI ) injection 0.25 mg  0.25 mg Intravenous Once Sherrod Sherrod, MD        SURGICAL HISTORY:  Past Surgical History:  Procedure Laterality Date   COLONOSCOPY  2015   COLONOSCOPY WITH PROPOFOL   01/02/2017   Dr.Pyrtle   IR IMAGING GUIDED PORT INSERTION  04/02/2024   LEFT HEART CATHETERIZATION WITH CORONARY ANGIOGRAM  08/26/2013   Mild to moderate disease with 40-50% stenosis and OM1. Otherwise no significant CAD --  Surgeon: Alm LELON Clay, MD;  Location: [facility] C[facility] LAB;  Service: Cardiovascular;;   Lower extremity arterial Dopplers  06/11/2013   No occlusive disease   LUMBAR LAMINECTOMY/DECOMPRESSION MICRODISCECTOMY Right 10/07/2014   Procedure: LUMBAR LAMINECTOMY/DECOMPRESSION MICRODISCECTOMY 1 LEVEL;  Surgeon: Arley Helling, MD;  Location: [facility] NEURO ORS;  Service: Neurosurgery;  Laterality: Right;  Right L3-L4 Microdiscectomy   NM MYOVIEW  LTD  06/03/2013   Low risk study with no ischemia. EF roughly 44% with no regional wall motion abnormalities noted   PFTs  06/16/2013   Normal Volumes &  Spirometry; Moderately reduced DLCO   POLYPECTOMY     SHOULDER HEMI-ARTHROPLASTY Right 05/28/2015   Procedure: RIGHT SHOULDER HEMI-ARTHROPLASTY CTA HEAD AND SUBSCAP REPAIR ;  Surgeon: Marcey Her, MD;  Location: [facility] OR;  Service: Orthopedics;  Laterality: Right;   TRANSTHORACIC ECHOCARDIOGRAM  06/11/2013   Mildly reduced EF: 45-50%. Mild anterior hypokinesis with incoordinate septal motion. No evidence of pulmonary hypertension   VIDEO BRONCHOSCOPY WITH ENDOBRONCHIAL ULTRASOUND Left 02/19/2024   Procedure: BRONCHOSCOPY, WITH EBUS;  Surgeon: Shelah Lamar RAMAN, MD;  Location: [facility] ENDOSCOPY;  Service: Pulmonary;  Laterality: Left;    REVIEW OF SYSTEMS:   Review of Systems  Constitutional: Stable fatigue.  negative for appetite change, chills, fever and unexpected weight change.  HENT: Positive for odynophagia/dysphagia.  Negative for mouth sores, nosebleeds, sore throat and trouble swallowing.   Eyes: Negative for eye problems  and icterus.  Respiratory: Stable intermittent shortness of breath and intermittent cough negative for hemoptysis and wheezing.   Cardiovascular: Positive for intermittent right rib pain. Negative for leg swelling.  Gastrointestinal: Negative for abdominal pain, constipation, diarrhea, nausea and vomiting.  Genitourinary: Negative for bladder incontinence, difficulty urinating, dysuria, frequency and hematuria.   Musculoskeletal: Positive for  Negative for back pain, gait problem, neck pain and neck stiffness.  Skin: Negative for itching and rash.  Neurological: Negative for dizziness, extremity weakness, gait problem, headaches, light-headedness and seizures.  Hematological: Negative for adenopathy. Does not bruise/bleed easily.  Psychiatric/Behavioral: Negative for confusion, depression and sleep disturbance. The patient is not nervous/anxious.     PHYSICAL EXAMINATION:  Blood pressure (!) 88/46, pulse 74, temperature 98 F (36.7 C), temperature source Temporal, resp. rate 17, height 5' 11 (1.803 m), weight 180 lb (81.6 kg), SpO2 100%.  ECOG PERFORMANCE STATUS: 1  Physical Exam  Constitutional: Oriented to person, place, and time and well-developed, well-nourished, and in no distress.  HENT:  Head: Normocephalic and atraumatic.  Mouth/Throat: Oropharynx is clear and moist. No oropharyngeal exudate.  Eyes: Conjunctivae are normal. Right eye exhibits no discharge. Left eye exhibits no discharge. No scleral icterus.  Neck: Normal range of motion. Neck supple.  Cardiovascular: Normal rate, regular rhythm, normal heart sounds and intact distal pulses.   Pulmonary/Chest: Effort normal and breath sounds normal. No respiratory distress. No wheezes. No rales.  Abdominal:  Soft. Bowel sounds are normal. Exhibits no distension and no mass. There is no tenderness.  Musculoskeletal: Normal range of motion. Exhibits no edema.  Lymphadenopathy:    No cervical adenopathy.  Neurological: Alert and oriented to person, place, and time. Exhibits normal muscle tone. Gait normal. Coordination normal.  Skin: Skin is warm and dry. No rash noted. Not diaphoretic. No erythema. No pallor.  Psychiatric: Mood, memory and judgment normal.  Vitals reviewed.  LABORATORY DATA: Lab Results  Component Value Date   WBC 2.5 (L) 05/26/2024   HGB 10.1 (L) 05/26/2024   HCT 29.5 (L) 05/26/2024   MCV 90.5 05/26/2024   PLT 97 (L) 05/26/2024      Chemistry      Component Value Date/Time   NA 141 05/26/2024 1336   K 3.7 05/26/2024 1336   CL 111 05/26/2024 1336   CO2 26 05/26/2024 1336   BUN 18 05/26/2024 1336   CREATININE 0.79 05/26/2024 1336   CREATININE 0.84 04/30/2015 1738      Component Value Date/Time   CALCIUM  8.5 (L) 05/26/2024 1336   ALKPHOS 77 05/26/2024 1336   AST 15 05/26/2024 1336   ALT 12 05/26/2024 1336   BILITOT 0.4 05/26/2024 1336       RADIOGRAPHIC STUDIES:  CT ABDOMEN PELVIS W CONTRAST Result Date: 05/06/2024 CLINICAL DATA:  Left lower quadrant abdominal pain. EXAM: CT ABDOMEN AND PELVIS WITH CONTRAST TECHNIQUE: Multidetector CT imaging of the abdomen and pelvis was performed using the standard protocol following bolus administration of intravenous contrast. RADIATION DOSE REDUCTION: This exam was performed according to the departmental dose-optimization program which includes automated exposure control, adjustment of the mA and/or kV according to patient size and/or use of iterative reconstruction technique. CONTRAST:  OMNIPAQUE  IOHEXOL  300 MG/ML  SOLN COMPARISON:  April 14, 2024 FINDINGS: Lower chest: Emphysematous lung disease and chronic areas of scarring and/or atelectasis are noted within the bilateral lung bases. Hepatobiliary: There is  diffuse fatty infiltration of the liver parenchyma. No focal liver abnormality is seen. No gallstones, gallbladder wall thickening, or  biliary dilatation. Pancreas: Unremarkable. No pancreatic ductal dilatation or surrounding inflammatory changes. Spleen: Normal in size without focal abnormality. Adrenals/Urinary Tract: Adrenal glands are unremarkable. Kidneys are normal, without renal calculi, focal lesion, or hydronephrosis. Bladder is unremarkable. Stomach/Bowel: Stomach is within normal limits. Appendix appears normal. No evidence of bowel wall thickening, distention, or inflammatory changes. Vascular/Lymphatic: Aortic atherosclerosis. No enlarged abdominal or pelvic lymph nodes. Reproductive: Prostate gland is mildly enlarged. Other: No abdominal wall hernia or abnormality. No abdominopelvic ascites. Musculoskeletal: A chronic fracture deformity is seen at the level of L4. Multilevel degenerative changes are present throughout the lumbar spine. IMPRESSION: 1. No acute or active process within the abdomen or pelvis. 2. Hepatic steatosis. 3. Emphysematous lung disease and chronic areas of scarring and/or atelectasis within the bilateral lung bases. 4. Aortic atherosclerosis. Electronically Signed   By: Suzen Dials M.D.   On: 05/06/2024 19:10   DG Chest Portable 1 View Result Date: 05/06/2024 CLINICAL DATA:  Dyspnea EXAM: PORTABLE CHEST 1 VIEW COMPARISON:  04/27/2024, chest CT 04/27/2024, PET CT 02/18/2024 FINDINGS: Right-sided central venous port tip at the SVC. Left hilar/suprahilar mass again noted. No acute airspace disease or effusion. Normal cardiac size. No pneumothorax IMPRESSION: No active disease. Left hilar/suprahilar mass again noted. Electronically Signed   By: Luke Bun M.D.   On: 05/06/2024 17:27   CT Angio Chest PE W and/or Wo Contrast Result Date: 04/27/2024 CLINICAL DATA:  Shortness of breath, known left hilar mass EXAM: CT ANGIOGRAPHY CHEST WITH CONTRAST TECHNIQUE:  Multidetector CT imaging of the chest was performed using the standard protocol during bolus administration of intravenous contrast. Multiplanar CT image reconstructions and MIPs were obtained to evaluate the vascular anatomy. RADIATION DOSE REDUCTION: This exam was performed according to the departmental dose-optimization program which includes automated exposure control, adjustment of the mA and/or kV according to patient size and/or use of iterative reconstruction technique. CONTRAST:  75mL OMNIPAQUE  IOHEXOL  350 MG/ML SOLN COMPARISON:  PET-CT from 02/18/2024, chest x-ray from earlier in the same day. FINDINGS: Cardiovascular: Atherosclerotic calcifications of the thoracic aorta are noted. No aneurysmal dilatation or dissection is seen. The pulmonary artery shows a normal branching pattern without intraluminal filling defect to suggest pulmonary embolism. Some attenuation of the left upper lobe arterial structures is noted secondary to the known left suprahilar mass. No cardiac enlargement is seen. Mediastinum/Nodes: Thoracic inlet is within normal limits. The esophagus is unremarkable. Left hilar mass/associated adenopathy consistent with the known history. Lungs/Pleura: Lungs are well aerated bilaterally. Diffuse emphysematous changes are noted. The known left hilar/suprahilar mass lesion is again identified with some attenuation of adjacent upper lobe pulmonary arterial branches. The lesion measures approximately 3.9 by 3.6 cm in greatest AP and transverse dimensions respectively. No other parenchymal nodule is seen. No sizable effusion is noted. Upper Abdomen: Visualized upper abdomen is within normal limits. Musculoskeletal: Degenerative changes of the thoracic spine are seen. Review of the MIP images confirms the above findings. IMPRESSION: No evidence of pulmonary emboli. Stable appearing left hilar/suprahilar mass lesion consistent with neoplasm. No other focal abnormality is noted. Aortic Atherosclerosis  (ICD10-I70.0) and Emphysema (ICD10-J43.9). Electronically Signed   By: Oneil Devonshire M.D.   On: 04/27/2024 21:29   DG Chest 2 View Result Date: 04/27/2024 CLINICAL DATA:  Chest pain shortness of breath EXAM: CHEST - 2 VIEW COMPARISON:  02/18/2024 PET-CT FINDINGS: Cardiac shadow is within normal limits. Right chest wall port is noted. Left hilar and suprahilar mass lesion is again identified and stable from prior PET-CT. No  sizable effusion is noted. No bony abnormality is seen. IMPRESSION: Stable left hilar and suprahilar mass. Electronically Signed   By: Oneil Devonshire M.D.   On: 04/27/2024 19:30     ASSESSMENT/PLAN:  This is a very pleasant 68 year old Caucasian male diagnosed with stage IIIa (T3, N2, M0) non-small cell lung cancer, adenocarcinoma.  The patient presented with a left suprahilar mass in addition to suspicious subcarinal lymphadenopathy.  The patient was diagnosed in August 2025.  The patient is currently undergoing a course of concurrent chemoradiation with weekly carboplatin  for an AUC of 2 and paclitaxel  45 mg/m.  He is status post 5 treatments.  Treatment has been held on occasion due to thrombocytopenia.  Labs were reviewed his last day radiation is scheduled for later this week on 05/29/2024.  Recommend that he proceed with cycle #6 today as scheduled.  He is okay to treat with his platelet count of 97K.  We will arrange for 1 L of fluid over 2 hours to be administered with his treatment today.  Therefore he is okay to treat with his blood pressure.  I will arrange for restaging CT scan to be performed of the chest in 3 weeks after the last day radiation.  We will then see the patient back in the clinic 1 week later to review the results in the office and discuss the next steps.  He will continue using Carafate for the esophagitis.  Hypertension with recent low blood pressure readings Hypertension with recent low readings, possibly due to reduced intake. Blood pressure 119/93  before lisinopril . - Monitor blood pressure at home before lisinopril . - Hold lisinopril  if blood pressure normal or low. - Contact primary care for potential dosage adjustment. - Administered IV fluids with today's treatment.  Constipation related to antiemetic therapy Constipation likely due to antiemetic therapy. Stool softeners effective. - Continue stool softeners as needed.  Musculoskeletal chest wall pain Soreness in chest wall, likely musculoskeletal. No rash observed. - Use Tylenol  for pain. - Apply heating pads or topical patches for localized relief. - Evaluate pain on upcoming CT scan.   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 Hydroxyzine helpful. - Continue hycet prior to meals.  The patient was advised to call immediately if she has any concerning symptoms in the interval. The patient voices understanding of current disease status and treatment options and is in agreement with the current care plan. All questions were answered. The patient knows to call the clinic with any problems, questions or concerns. We can certainly see the patient much sooner if necessary   Orders Placed This Encounter  Procedures   CT Chest W Contrast    Standing Status:   Future    Expected Date:   06/19/2024    Expiration Date:   05/26/2025    If indicated for the ordered procedure, I authorize the administration of contrast media per Radiology protocol:   Yes    Does the patient have a contrast media/X-ray dye allergy?:   No    Preferred imaging location?:   [facility]     The total time spent in the appointment was 20-29 minutes  Kofi Murrell L Winner Valeriano, PA-C 05/26/24

## 2024-05-26 ENCOUNTER — Ambulatory Visit
Admission: RE | Admit: 2024-05-26 | Discharge: 2024-05-26 | Disposition: A | Discharge status: Home / Self Care | Source: Ambulatory Visit | Attending: Radiation Oncology | Admitting: Radiation Oncology

## 2024-05-26 ENCOUNTER — Encounter: Payer: Self-pay | Admitting: Internal Medicine

## 2024-05-26 ENCOUNTER — Inpatient Hospital Stay (HOSPITAL_BASED_OUTPATIENT_CLINIC_OR_DEPARTMENT_OTHER): Admitting: Physician Assistant

## 2024-05-26 ENCOUNTER — Inpatient Hospital Stay

## 2024-05-26 ENCOUNTER — Other Ambulatory Visit: Payer: Self-pay

## 2024-05-26 ENCOUNTER — Ambulatory Visit

## 2024-05-26 ENCOUNTER — Other Ambulatory Visit: Payer: Self-pay | Admitting: Radiation Oncology

## 2024-05-26 VITALS — BP 102/67 | HR 70 | Resp 18

## 2024-05-26 VITALS — BP 88/46 | HR 74 | Temp 98.0°F | Resp 17 | Ht 71.0 in | Wt 180.0 lb

## 2024-05-26 DIAGNOSIS — E860 Dehydration: Secondary | ICD-10-CM | POA: Diagnosis not present

## 2024-05-26 DIAGNOSIS — C3412 Malignant neoplasm of upper lobe, left bronchus or lung: Secondary | ICD-10-CM

## 2024-05-26 DIAGNOSIS — Z510 Encounter for antineoplastic radiation therapy: Secondary | ICD-10-CM | POA: Diagnosis not present

## 2024-05-26 LAB — RAD ONC ARIA SESSION SUMMARY
Course Elapsed Days: 42
Plan Fractions Treated to Date: 30
Plan Prescribed Dose Per Fraction: 2 Gy
Plan Total Fractions Prescribed: 30
Plan Total Prescribed Dose: 60 Gy
Reference Point Dosage Given to Date: 60 Gy
Reference Point Session Dosage Given: 2 Gy
Session Number: 30

## 2024-05-26 LAB — CBC WITH DIFFERENTIAL (CANCER CENTER ONLY)
Abs Immature Granulocytes: 0.02 K/uL (ref 0.00–0.07)
Basophils Absolute: 0 K/uL (ref 0.0–0.1)
Basophils Relative: 0 %
Eosinophils Absolute: 0.1 K/uL (ref 0.0–0.5)
Eosinophils Relative: 3 %
HCT: 29.5 % — ABNORMAL LOW (ref 39.0–52.0)
Hemoglobin: 10.1 g/dL — ABNORMAL LOW (ref 13.0–17.0)
Immature Granulocytes: 1 %
Lymphocytes Relative: 18 %
Lymphs Abs: 0.4 K/uL — ABNORMAL LOW (ref 0.7–4.0)
MCH: 31 pg (ref 26.0–34.0)
MCHC: 34.2 g/dL (ref 30.0–36.0)
MCV: 90.5 fL (ref 80.0–100.0)
Monocytes Absolute: 0.2 K/uL (ref 0.1–1.0)
Monocytes Relative: 6 %
Neutro Abs: 1.8 K/uL (ref 1.7–7.7)
Neutrophils Relative %: 72 %
Platelet Count: 97 K/uL — ABNORMAL LOW (ref 150–400)
RBC: 3.26 MIL/uL — ABNORMAL LOW (ref 4.22–5.81)
RDW: 16 % — ABNORMAL HIGH (ref 11.5–15.5)
WBC Count: 2.5 K/uL — ABNORMAL LOW (ref 4.0–10.5)
nRBC: 0 % (ref 0.0–0.2)

## 2024-05-26 LAB — CMP (CANCER CENTER ONLY)
ALT: 12 U/L (ref 0–44)
AST: 15 U/L (ref 15–41)
Albumin: 3.3 g/dL — ABNORMAL LOW (ref 3.5–5.0)
Alkaline Phosphatase: 77 U/L (ref 38–126)
Anion gap: 4 — ABNORMAL LOW (ref 5–15)
BUN: 18 mg/dL (ref 8–23)
CO2: 26 mmol/L (ref 22–32)
Calcium: 8.5 mg/dL — ABNORMAL LOW (ref 8.9–10.3)
Chloride: 111 mmol/L (ref 98–111)
Creatinine: 0.79 mg/dL (ref 0.61–1.24)
GFR, Estimated: >60 mL/min (ref >60)
Glucose, Bld: 83 mg/dL (ref 70–99)
Potassium: 3.7 mmol/L (ref 3.5–5.1)
Sodium: 141 mmol/L (ref 135–145)
Total Bilirubin: 0.4 mg/dL (ref 0.0–1.2)
Total Protein: 6.3 g/dL — ABNORMAL LOW (ref 6.5–8.1)

## 2024-05-26 MED ORDER — DIPHENHYDRAMINE HCL 50 MG/ML IJ SOLN
50.0000 mg | Freq: Once | INTRAMUSCULAR | Status: AC
  Administered 2024-05-26: 50 mg via INTRAVENOUS
  Filled 2024-05-26: qty 1

## 2024-05-26 MED ORDER — SODIUM CHLORIDE 0.9 % IV SOLN
220.0000 mg | Freq: Once | INTRAVENOUS | Status: AC
  Administered 2024-05-26: 220 mg via INTRAVENOUS
  Filled 2024-05-26: qty 22

## 2024-05-26 MED ORDER — SODIUM CHLORIDE 0.9 % IV SOLN: Freq: Once | INTRAVENOUS | Status: AC

## 2024-05-26 MED ORDER — PALONOSETRON HCL INJECTION 0.25 MG/5ML
0.2500 mg | Freq: Once | INTRAVENOUS | Status: AC
  Administered 2024-05-26: 0.25 mg via INTRAVENOUS
  Filled 2024-05-26: qty 5

## 2024-05-26 MED ORDER — HYDROCODONE-ACETAMINOPHEN 5-325 MG PO TABS: 1.0000 | ORAL_TABLET | Freq: Four times a day (QID) | ORAL | 0 refills | Status: AC | PRN

## 2024-05-26 MED ORDER — FAMOTIDINE IN NACL 20-0.9 MG/50ML-% IV SOLN
20.0000 mg | Freq: Once | INTRAVENOUS | Status: AC
  Administered 2024-05-26: 20 mg via INTRAVENOUS
  Filled 2024-05-26: qty 50

## 2024-05-26 MED ORDER — SODIUM CHLORIDE 0.9 % IV SOLN: INTRAVENOUS | Status: DC

## 2024-05-26 MED ORDER — SODIUM CHLORIDE 0.9 % IV SOLN
45.0000 mg/m2 | Freq: Once | INTRAVENOUS | Status: AC
  Administered 2024-05-26: 96 mg via INTRAVENOUS
  Filled 2024-05-26: qty 16

## 2024-05-26 NOTE — Patient Instructions (Signed)
 [facility] - A DEPT OF [facility]  Discharge Instructions: Thank you for choosing [facility] to provide your oncology and hematology care.   If you have a lab appointment with the Cancer Center, please go directly to the Cancer Center and check in at the registration area.   Wear comfortable clothing and clothing appropriate for easy access to any Portacath or PICC line.   We strive to give you quality time with your provider. You may need to reschedule your appointment if you arrive late (15 or more minutes).  Arriving late affects you and other patients whose appointments are after yours.  Also, if you miss three or more appointments without notifying the office, you may be dismissed from the clinic at the provider's discretion.      For prescription refill requests, have your pharmacy contact our office and allow 72 hours for refills to be completed.    Today you received the following chemotherapy and/or immunotherapy agents: Paclitaxel , Carboplatin .       To help prevent nausea and vomiting after your treatment, we encourage you to take your nausea medication as directed.  BELOW ARE SYMPTOMS THAT SHOULD BE REPORTED IMMEDIATELY: *FEVER GREATER THAN 100.4 F (38 C) OR HIGHER *CHILLS OR SWEATING *NAUSEA AND VOMITING THAT IS NOT CONTROLLED WITH YOUR NAUSEA MEDICATION *UNUSUAL SHORTNESS OF BREATH *UNUSUAL BRUISING OR BLEEDING *URINARY PROBLEMS (pain or burning when urinating, or frequent urination) *BOWEL PROBLEMS (unusual diarrhea, constipation, pain near the anus) TENDERNESS IN MOUTH AND THROAT WITH OR WITHOUT PRESENCE OF ULCERS (sore throat, sores in mouth, or a toothache) UNUSUAL RASH, SWELLING OR PAIN  UNUSUAL VAGINAL DISCHARGE OR ITCHING   Items with * indicate a potential emergency and should be followed up as soon as possible or go to the Emergency Department if any problems should occur.  Please show the CHEMOTHERAPY ALERT CARD  or IMMUNOTHERAPY ALERT CARD at check-in to the Emergency Department and triage nurse.  Should you have questions after your visit or need to cancel or reschedule your appointment, please contact [facility] - A DEPT OF [facility]  Dept: [phone]  and follow the prompts.  Office hours are 8:00 a.m. to 4:30 p.m. Monday - Friday. Please note that voicemails left after 4:00 p.m. may not be returned until the following business day.  We are closed weekends and major holidays. You have access to a nurse at all times for urgent questions. Please call the main number to the clinic Dept: [phone] and follow the prompts.   For any non-urgent questions, you may also contact your provider using MyChart. We now offer e-Visits for anyone 64 and older to request care online for non-urgent symptoms. For details visit [email or web address].   Also download the MyChart app! Go to the app store, search MyChart, open the app, select [employer], and log in with your MyChart username and password.

## 2024-05-27 ENCOUNTER — Ambulatory Visit

## 2024-05-27 ENCOUNTER — Ambulatory Visit
Admission: RE | Admit: 2024-05-27 | Discharge: 2024-05-27 | Disposition: A | Discharge status: Home / Self Care | Source: Ambulatory Visit | Attending: Radiation Oncology | Admitting: Radiation Oncology

## 2024-05-27 ENCOUNTER — Other Ambulatory Visit: Payer: Self-pay

## 2024-05-27 DIAGNOSIS — Z510 Encounter for antineoplastic radiation therapy: Secondary | ICD-10-CM | POA: Diagnosis not present

## 2024-05-27 LAB — RAD ONC ARIA SESSION SUMMARY
Course Elapsed Days: 43
Plan Fractions Treated to Date: 1
Plan Prescribed Dose Per Fraction: 2 Gy
Plan Total Fractions Prescribed: 3
Plan Total Prescribed Dose: 6 Gy
Reference Point Dosage Given to Date: 2 Gy
Reference Point Session Dosage Given: 2 Gy
Session Number: 31

## 2024-05-28 ENCOUNTER — Ambulatory Visit
Admission: RE | Admit: 2024-05-28 | Discharge: 2024-05-28 | Disposition: A | Discharge status: Home / Self Care | Source: Ambulatory Visit | Attending: Radiation Oncology | Admitting: Radiation Oncology

## 2024-05-28 ENCOUNTER — Other Ambulatory Visit: Payer: Self-pay

## 2024-05-28 ENCOUNTER — Ambulatory Visit

## 2024-05-28 DIAGNOSIS — Z510 Encounter for antineoplastic radiation therapy: Secondary | ICD-10-CM | POA: Diagnosis not present

## 2024-05-28 LAB — RAD ONC ARIA SESSION SUMMARY
Course Elapsed Days: 44
Plan Fractions Treated to Date: 2
Plan Prescribed Dose Per Fraction: 2 Gy
Plan Total Fractions Prescribed: 3
Plan Total Prescribed Dose: 6 Gy
Reference Point Dosage Given to Date: 4 Gy
Reference Point Session Dosage Given: 2 Gy
Session Number: 32

## 2024-05-29 ENCOUNTER — Telehealth: Payer: Self-pay | Admitting: Physician Assistant

## 2024-05-29 ENCOUNTER — Ambulatory Visit
Admission: RE | Admit: 2024-05-29 | Discharge: 2024-05-29 | Disposition: A | Discharge status: Home / Self Care | Source: Ambulatory Visit | Attending: Radiation Oncology | Admitting: Radiation Oncology

## 2024-05-29 ENCOUNTER — Other Ambulatory Visit: Payer: Self-pay

## 2024-05-29 DIAGNOSIS — Z510 Encounter for antineoplastic radiation therapy: Secondary | ICD-10-CM | POA: Diagnosis not present

## 2024-05-29 LAB — RAD ONC ARIA SESSION SUMMARY
Course Elapsed Days: 45
Plan Fractions Treated to Date: 3
Plan Prescribed Dose Per Fraction: 2 Gy
Plan Total Fractions Prescribed: 3
Plan Total Prescribed Dose: 6 Gy
Reference Point Dosage Given to Date: 6 Gy
Reference Point Session Dosage Given: 2 Gy
Session Number: 33

## 2024-05-29 NOTE — Telephone Encounter (Signed)
 Scheduled patient for next appointment. Called and left a voicemail with the details.

## 2024-05-30 ENCOUNTER — Encounter: Payer: Self-pay | Admitting: Internal Medicine

## 2024-05-30 ENCOUNTER — Encounter: Payer: Self-pay | Admitting: *Deleted

## 2024-05-30 ENCOUNTER — Telehealth: Payer: Self-pay

## 2024-05-30 NOTE — Telephone Encounter (Signed)
 Spoke with the patient's daughter-in-law, Autumn, regarding the MyChart message sent. Reviewed and relayed the recommendations provided by Sharene, RN.   Spoke with patients son, Jones and informed him that if the patient ever feels he needs IV fluids, he should contact our office and we can arrange this if he begins to feel dehydrated. He voiced understanding.

## 2024-06-01 ENCOUNTER — Emergency Department (HOSPITAL_COMMUNITY)
Admission: EM | Admit: 2024-06-01 | Discharge: 2024-06-01 | Disposition: A | Discharge status: Home / Self Care | Attending: Emergency Medicine | Admitting: Emergency Medicine

## 2024-06-01 ENCOUNTER — Other Ambulatory Visit: Payer: Self-pay

## 2024-06-01 ENCOUNTER — Emergency Department (HOSPITAL_COMMUNITY)

## 2024-06-01 ENCOUNTER — Encounter: Payer: Self-pay | Admitting: Internal Medicine

## 2024-06-01 DIAGNOSIS — Z85118 Personal history of other malignant neoplasm of bronchus and lung: Secondary | ICD-10-CM | POA: Diagnosis not present

## 2024-06-01 DIAGNOSIS — K208 Other esophagitis without bleeding: Secondary | ICD-10-CM | POA: Diagnosis not present

## 2024-06-01 DIAGNOSIS — D61818 Other pancytopenia: Secondary | ICD-10-CM | POA: Insufficient documentation

## 2024-06-01 DIAGNOSIS — Z96611 Presence of right artificial shoulder joint: Secondary | ICD-10-CM | POA: Diagnosis not present

## 2024-06-01 DIAGNOSIS — Z7982 Long term (current) use of aspirin: Secondary | ICD-10-CM | POA: Diagnosis not present

## 2024-06-01 DIAGNOSIS — E876 Hypokalemia: Secondary | ICD-10-CM | POA: Insufficient documentation

## 2024-06-01 DIAGNOSIS — R1110 Vomiting, unspecified: Secondary | ICD-10-CM | POA: Diagnosis not present

## 2024-06-01 DIAGNOSIS — R197 Diarrhea, unspecified: Secondary | ICD-10-CM | POA: Diagnosis not present

## 2024-06-01 LAB — CBC WITH DIFFERENTIAL/PLATELET
Abs Immature Granulocytes: 0 K/uL (ref 0.00–0.07)
Basophils Absolute: 0 K/uL (ref 0.0–0.1)
Basophils Relative: 1 %
Eosinophils Absolute: 0 K/uL (ref 0.0–0.5)
Eosinophils Relative: 1 %
HCT: 27.5 % — ABNORMAL LOW (ref 39.0–52.0)
Hemoglobin: 9 g/dL — ABNORMAL LOW (ref 13.0–17.0)
Immature Granulocytes: 0 %
Lymphocytes Relative: 24 %
Lymphs Abs: 0.3 K/uL — ABNORMAL LOW (ref 0.7–4.0)
MCH: 30.6 pg (ref 26.0–34.0)
MCHC: 32.7 g/dL (ref 30.0–36.0)
MCV: 93.5 fL (ref 80.0–100.0)
Monocytes Absolute: 0.1 K/uL (ref 0.1–1.0)
Monocytes Relative: 10 %
Neutro Abs: 0.8 K/uL — ABNORMAL LOW (ref 1.7–7.7)
Neutrophils Relative %: 64 %
Platelets: 89 K/uL — ABNORMAL LOW (ref 150–400)
RBC: 2.94 MIL/uL — ABNORMAL LOW (ref 4.22–5.81)
RDW: 16.6 % — ABNORMAL HIGH (ref 11.5–15.5)
Smear Review: NORMAL
WBC: 1.2 K/uL — CL (ref 4.0–10.5)
nRBC: 0 % (ref 0.0–0.2)

## 2024-06-01 LAB — COMPREHENSIVE METABOLIC PANEL WITH GFR
ALT: 9 U/L (ref 0–44)
AST: 17 U/L (ref 15–41)
Albumin: 3.4 g/dL — ABNORMAL LOW (ref 3.5–5.0)
Alkaline Phosphatase: 86 U/L (ref 38–126)
Anion gap: 9 (ref 5–15)
BUN: 14 mg/dL (ref 8–23)
CO2: 25 mmol/L (ref 22–32)
Calcium: 8.4 mg/dL — ABNORMAL LOW (ref 8.9–10.3)
Chloride: 109 mmol/L (ref 98–111)
Creatinine, Ser: 0.76 mg/dL (ref 0.61–1.24)
GFR, Estimated: >60 mL/min (ref >60)
Glucose, Bld: 90 mg/dL (ref 70–99)
Potassium: 3.3 mmol/L — ABNORMAL LOW (ref 3.5–5.1)
Sodium: 143 mmol/L (ref 135–145)
Total Bilirubin: 0.6 mg/dL (ref 0.0–1.2)
Total Protein: 6.2 g/dL — ABNORMAL LOW (ref 6.5–8.1)

## 2024-06-01 LAB — URINALYSIS, ROUTINE W REFLEX MICROSCOPIC
Bilirubin Urine: NEGATIVE
Glucose, UA: NEGATIVE mg/dL
Hgb urine dipstick: NEGATIVE
Ketones, ur: NEGATIVE mg/dL
Leukocytes,Ua: NEGATIVE
Nitrite: NEGATIVE
Protein, ur: NEGATIVE mg/dL
Specific Gravity, Urine: 1.025 (ref 1.005–1.030)
pH: 5 (ref 5.0–8.0)

## 2024-06-01 LAB — LIPASE, BLOOD: Lipase: 11 U/L (ref 11–51)

## 2024-06-01 LAB — MAGNESIUM: Magnesium: 1.6 mg/dL — ABNORMAL LOW (ref 1.7–2.4)

## 2024-06-01 MED ORDER — MAALOX MAX 400-400-40 MG/5ML PO SUSP: 15.0000 mL | Freq: Four times a day (QID) | ORAL | 0 refills | Status: AC | PRN

## 2024-06-01 MED ORDER — HEPARIN SOD (PORK) LOCK FLUSH 100 UNIT/ML IV SOLN
INTRAVENOUS | Status: AC
  Filled 2024-06-01: qty 5

## 2024-06-01 MED ORDER — POTASSIUM CHLORIDE 20 MEQ PO PACK
40.0000 meq | PACK | Freq: Once | ORAL | Status: AC
  Administered 2024-06-01: 40 meq via ORAL
  Filled 2024-06-01: qty 2

## 2024-06-01 MED ORDER — LIDOCAINE VISCOUS HCL 2 % MT SOLN: 15.0000 mL | OROMUCOSAL | 0 refills | Status: AC | PRN

## 2024-06-01 MED ORDER — LIDOCAINE VISCOUS HCL 2 % MT SOLN
15.0000 mL | Freq: Once | OROMUCOSAL | Status: AC
  Administered 2024-06-01: 15 mL via ORAL
  Filled 2024-06-01: qty 15

## 2024-06-01 MED ORDER — MAGNESIUM SULFATE 50 % IJ SOLN: 2.0000 g | Freq: Once | INTRAMUSCULAR | Status: DC

## 2024-06-01 MED ORDER — ONDANSETRON HCL 4 MG/2ML IJ SOLN
4.0000 mg | Freq: Once | INTRAMUSCULAR | Status: AC
  Administered 2024-06-01: 4 mg via INTRAVENOUS
  Filled 2024-06-01: qty 2

## 2024-06-01 MED ORDER — POTASSIUM CHLORIDE 10 MEQ/100ML IV SOLN
10.0000 meq | Freq: Once | INTRAVENOUS | Status: AC
  Administered 2024-06-01: 10 meq via INTRAVENOUS
  Filled 2024-06-01: qty 100

## 2024-06-01 MED ORDER — MAGNESIUM SULFATE 2 GM/50ML IV SOLN
2.0000 g | Freq: Once | INTRAVENOUS | Status: AC
  Administered 2024-06-01: 2 g via INTRAVENOUS
  Filled 2024-06-01: qty 50

## 2024-06-01 MED ORDER — SUCRALFATE 1 G PO TABS: 1.0000 g | ORAL_TABLET | Freq: Four times a day (QID) | ORAL | 2 refills | Status: AC

## 2024-06-01 MED ORDER — FAMOTIDINE IN NACL 20-0.9 MG/50ML-% IV SOLN
20.0000 mg | Freq: Once | INTRAVENOUS | Status: AC
  Administered 2024-06-01: 20 mg via INTRAVENOUS
  Filled 2024-06-01: qty 50

## 2024-06-01 MED ORDER — ALUM & MAG HYDROXIDE-SIMETH 200-200-20 MG/5ML PO SUSP
30.0000 mL | Freq: Once | ORAL | Status: AC
  Administered 2024-06-01: 30 mL via ORAL
  Filled 2024-06-01: qty 30

## 2024-06-01 MED ORDER — LACTATED RINGERS IV BOLUS
1000.0000 mL | Freq: Once | INTRAVENOUS | Status: AC
  Administered 2024-06-01: 1000 mL via INTRAVENOUS

## 2024-06-01 MED ORDER — LIDOCAINE VISCOUS HCL 2 % MT SOLN
15.0000 mL | Freq: Once | OROMUCOSAL | Status: AC
  Administered 2024-06-01: 15 mL via OROMUCOSAL
  Filled 2024-06-01: qty 15

## 2024-06-01 NOTE — ED Provider Notes (Signed)
  EMERGENCY DEPARTMENT AT [facility] Provider Note   CSN: [id number] Arrival date & time: 06/01/24  1609     Patient presents with: Dysphagia, Diarrhea, and Emesis   Charles Grimes is a 68 y.o. male.   Patient is a 68 year old male with a past medical history of lung cancer status post completion of chemo and radiation this past week presenting to the emergency department with throat pain and difficulty swallowing.  The patient states that since he has had his chemo he is what he describes as severe heartburn from his throat down to his epigastrium.  He states that he has been unable to swallow anything other than thickened medications due to the pain.  He states that he tried to eat a sandwich yesterday but had significant pain with swallowing.  He states that today he developed nausea, vomiting and diarrhea.  He states that he feels mildly short of breath.  He states that he is taking an antacid at home without significant relief.  He denies any associated fever.  He states that he is starting to feel lightheaded upon standing and getting cramps in his legs.  The history is provided by the patient and a relative.  Diarrhea Associated symptoms: vomiting   Emesis Associated symptoms: diarrhea        Prior to Admission medications   Medication Sig Start Date End Date Taking? Authorizing Provider  alum & mag hydroxide-simeth (MAALOX MAX) 400-400-40 MG/5ML suspension Take 15 mLs by mouth every 6 (six) hours as needed for indigestion. 06/01/24  Yes Ellouise, Tabby Beaston K, DO  arformoterol (BROVANA) 15 MCG/2ML NEBU Take 2 mLs (15 mcg total) by nebulization 2 (two) times daily. 05/01/24   Ruthell Lauraine FALCON, NP  budesonide (PULMICORT) 0.25 MG/2ML nebulizer solution Take 2 mLs (0.25 mg total) by nebulization 2 (two) times daily. 05/01/24 05/01/25  Ruthell Lauraine FALCON, NP  ipratropium (ATROVENT) 0.02 % nebulizer solution Take 2.5 mLs (0.5 mg total) by nebulization 4 (four) times daily.  05/01/24   Ruthell Lauraine FALCON, NP  lidocaine  (XYLOCAINE ) 2 % solution Use as directed 15 mLs in the mouth or throat every 4 (four) hours as needed for mouth pain. 06/01/24  Yes Ellouise, Jaymes Revels K, DO  albuterol  (PROVENTIL ) (2.5 MG/3ML) 0.083% nebulizer solution Take 3 mLs (2.5 mg total) by nebulization every 6 (six) hours as needed for wheezing or shortness of breath. 04/27/24   Melvenia Motto, MD  albuterol  (VENTOLIN  HFA) [phone] Base) MCG/ACT inhaler Inhale 2 puffs into the lungs every 6 (six) hours as needed for wheezing or shortness of breath. 02/25/24   Ruthell Lauraine FALCON, NP  ALPRAZolam  (XANAX ) 0.5 MG tablet Take 1 tablet (0.5 mg total) by mouth at bedtime as needed for anxiety. 10/24/23   [name], DO  aspirin  EC 81 MG tablet Take 81 mg by mouth daily.    [provider]  atorvastatin  (LIPITOR) 10 MG tablet Take 1 tablet (10 mg total) by mouth daily. 10/24/23   [name], DO  budesonide-glycopyrrolate -formoterol (BREZTRI  AEROSPHERE) 160-9-4.8 MCG/ACT AERO inhaler Inhale 2 puffs into the lungs in the morning and at bedtime. 02/25/24   Ruthell Lauraine FALCON, NP  budesonide-glycopyrrolate -formoterol (BREZTRI  AEROSPHERE) 160-9-4.8 MCG/ACT AERO inhaler Inhale 2 puffs into the lungs in the morning and at bedtime. 02/25/24   Ruthell Lauraine FALCON, NP  clotrimazole  (MYCELEX ) 10 MG troche Take 1 tablet (10 mg total) by mouth 3 (three) times daily. 02/25/24   Ruthell Lauraine FALCON, NP  colchicine  0.6  MG tablet Take 1 tablet (0.6 mg total) by mouth daily as needed (gout flares). 02/19/24   Shelah Lamar RAMAN, MD  docusate sodium  (COLACE) 100 MG capsule Take 1 capsule (100 mg total) by mouth every 12 (twelve) hours. 04/14/24   Mannie Fairy DASEN, DO  HYDROcodone -acetaminophen  (NORCO/VICODIN) 5-325 MG tablet Take 1 tablet by mouth every 6 (six) hours as needed for moderate pain (pain score 4-6). 05/26/24   Shannon Agent, MD  lidocaine  (XYLOCAINE ) 2 % solution Use as directed 15 mLs in the mouth or throat every 6  (six) hours as needed for mouth pain. 04/28/24   Sherrod Sherrod, MD  lidocaine -prilocaine  (EMLA ) cream Apply to affected area once 03/26/24   Sherrod Sherrod, MD  lisinopril  (ZESTRIL ) 10 MG tablet Take 1 tablet (10 mg total) by mouth daily. 01/14/24   [name], DO  methylPREDNISolone  (MEDROL  DOSEPAK) 4 MG TBPK tablet Use as instructed 05/12/24   Sherrod Sherrod, MD  metoCLOPramide  (REGLAN ) 10 MG tablet Take 1 tablet (10 mg total) by mouth every 8 (eight) hours as needed for nausea. 04/27/24   Melvenia Motto, MD  Multiple Vitamins-Minerals (CENTRUM SILVER 50+MEN) TABS Take 1 tablet by mouth daily.    [provider]  nitroGLYCERIN  (NITROSTAT ) 0.4 MG SL tablet Place 1 tablet (0.4 mg total) under the tongue every 5 (five) minutes as needed for chest pain. 02/08/21   [name], DO  NON FORMULARY Pt uses a c-pap nightly    [provider]  omeprazole  (PRILOSEC ) 20 MG capsule Take 1 capsule (20 mg total) by mouth daily. 01/14/24   [name], DO  ondansetron  (ZOFRAN ) 8 MG tablet Take 1 tablet (8 mg total) by mouth every 8 (eight) hours as needed for nausea or vomiting. Start on the third day after chemotherapy. 03/26/24   Sherrod Sherrod, MD  polyethylene glycol powder (MIRALAX ) 17 GM/SCOOP powder Take 17 g by mouth daily. Dissolve 1 capful (17g) in 4-8 ounces of liquid and take by mouth daily. 04/14/24   Mannie Fairy DASEN, DO  prochlorperazine  (COMPAZINE ) 10 MG tablet Take 1 tablet (10 mg total) by mouth every 6 (six) hours as needed for nausea or vomiting. 03/26/24   Sherrod Sherrod, MD  sertraline  (ZOLOFT ) 50 MG tablet Take 2 tabs in the morning and 1 tab in the evening. 01/14/24   [name], DO  Spacer/Aero-Holding Chambers (AEROCHAMBER PLUS) Device Per patient request to help with inhaler management and effectiveness. 04/28/24   Ruthell Lauraine FALCON, NP  sucralfate (CARAFATE) 1 g tablet Take 1 tablet (1 g total) by mouth 4 (four) times  daily. Dissolve each tablet in 15 cc water before use. 06/01/24   Kingsley, Harbour Nordmeyer K, DO  tiZANidine  (ZANAFLEX ) 4 MG tablet Take 1 tablet (4 mg total) by mouth at bedtime as needed for muscle spasms. 01/14/24   [name], DO  triamcinolone  cream (KENALOG ) 0.1 % Apply 1 Application topically 2 (two) times daily as needed (itch). 11/12/23   [name], DO    Allergies: Ibuprofen, Isosorbide  nitrate, Oxycodone , and Penicillins    Review of Systems  Gastrointestinal:  Positive for diarrhea and vomiting.    Updated Vital Signs BP 133/71   Pulse 69   Temp 98.1 F (36.7 C) (Oral)   Resp 20   Ht 5' 11 (1.803 m)   Wt 81.6 kg   SpO2 99%   BMI 25.10 kg/m   Physical Exam Vitals and nursing note reviewed.  Constitutional:  General: He is not in acute distress.    Appearance: Normal appearance.  HENT:     Head: Normocephalic and atraumatic.     Nose: Nose normal.     Mouth/Throat:     Mouth: Mucous membranes are moist.     Pharynx: Oropharynx is clear.  Eyes:     Extraocular Movements: Extraocular movements intact.     Conjunctiva/sclera: Conjunctivae normal.  Cardiovascular:     Rate and Rhythm: Normal rate and regular rhythm.     Heart sounds: Normal heart sounds.  Pulmonary:     Effort: Pulmonary effort is normal.     Breath sounds: Normal breath sounds.  Abdominal:     General: Abdomen is flat.     Palpations: Abdomen is soft.     Tenderness: There is abdominal tenderness (Epigastrium).  Musculoskeletal:        General: Normal range of motion.     Cervical back: Normal range of motion.  Skin:    General: Skin is warm and dry.  Neurological:     General: No focal deficit present.     Mental Status: He is alert and oriented to person, place, and time.  Psychiatric:        Mood and Affect: Mood normal.        Behavior: Behavior normal.     (all labs ordered are listed, but only abnormal results are displayed) Labs Reviewed  CBC WITH  DIFFERENTIAL/PLATELET - Abnormal; Notable for the following components:      Result Value   WBC 1.2 (*)    RBC 2.94 (*)    Hemoglobin 9.0 (*)    HCT 27.5 (*)    RDW 16.6 (*)    Platelets 89 (*)    Neutro Abs 0.8 (*)    Lymphs Abs 0.3 (*)    All other components within normal limits  COMPREHENSIVE METABOLIC PANEL WITH GFR - Abnormal; Notable for the following components:   Potassium 3.3 (*)    Calcium  8.4 (*)    Total Protein 6.2 (*)    Albumin 3.4 (*)    All other components within normal limits  MAGNESIUM  - Abnormal; Notable for the following components:   Magnesium  1.6 (*)    All other components within normal limits  URINALYSIS, ROUTINE W REFLEX MICROSCOPIC  LIPASE, BLOOD    EKG: EKG Interpretation Date/Time:  Sunday June 01 2024 17:40:56 EST Ventricular Rate:  69 PR Interval:  144 QRS Duration:  103 QT Interval:  456 QTC Calculation: 489 R Axis:   70  Text Interpretation: Sinus rhythm Borderline prolonged QT interval Otherwise no significant change Confirmed by Ellouise Fine ([id number]) on 06/01/2024 6:24:59 PM  Radiology: [facility] Chest Port 1 View Result Date: 06/01/2024 CLINICAL DATA:  Difficulty swallowing with vomiting and diarrhea. EXAM: PORTABLE CHEST 1 VIEW COMPARISON:  None Available. FINDINGS: There is stable right-sided venous Port-A-Cath positioning. The heart size and mediastinal contours are within normal limits. Both lungs are clear. There is evidence of prior right shoulder arthroplasty. The visualized skeletal structures are unremarkable. IMPRESSION: No active disease. Electronically Signed   By: Suzen Dials M.D.   On: 06/01/2024 18:39     Procedures   Medications Ordered in the ED  potassium chloride  10 mEq in 100 mL IVPB (10 mEq Intravenous New Bag/Given 06/01/24 1831)  lactated ringers  bolus 1,000 mL (0 mLs Intravenous Stopped 06/01/24 1912)  alum & mag hydroxide-simeth (MAALOX/MYLANTA) 200-200-20 MG/5ML suspension 30 mL (30 mLs Oral Given  06/01/24 1721)  And  lidocaine  (XYLOCAINE ) 2 % viscous mouth solution 15 mL (15 mLs Oral Given 06/01/24 1721)  ondansetron  (ZOFRAN ) injection 4 mg (4 mg Intravenous Given 06/01/24 1721)  famotidine  (PEPCID ) IVPB 20 mg premix (0 mg Intravenous Stopped 06/01/24 1751)  potassium chloride  (KLOR-CON ) packet 40 mEq (40 mEq Oral Given 06/01/24 1746)  magnesium  sulfate IVPB 2 g 50 mL (2 g Intravenous New Bag/Given 06/01/24 1824)    Clinical Course as of 06/01/24 1929  Sun Jun 01, 2024  1735 Worsening pancytopenia compared to baseline, mild hypokalemia will be repleted. [VK]    Clinical Course User Index [VK] Kingsley, Ashari Llewellyn K, DO                                 Medical Decision Making This patient presents to the ED with chief complaint(s) of throat pain with pertinent past medical history of lung cancer status post recent completion of chemo and radiation which further complicates the presenting complaint. The complaint involves an extensive differential diagnosis and also carries with it a high risk of complications and morbidity.    The differential diagnosis includes radiation esophagitis, dehydration, electrolyte abnormality, gastritis, GERD, pancreatitis, hepatitis, ACS, arrhythmia, anemia, pneumonia, pneumothorax, pulmonary edema, pleural effusion, gastroenteritis  Additional history obtained: Additional history obtained from family Records reviewed outpatient oncology records  ED Course and Reassessment: On patient's arrival he is hemodynamically stable in no acute distress.  He had labs initiated in triage that are pending at this time.  Will additionally need lipase and will have EKG and chest x-ray with associated chest pain and shortness of breath.  He will be started on fluids and given symptomatic treatment and will be closely reassessed.  Independent labs interpretation:  The following labs were independently interpreted: Mildly worsening pancytopenia from baseline, mild  hypokalemia and hypomagnesemia  Independent visualization of imaging: - I independently visualized the following imaging with scope of interpretation limited to determining acute life threatening conditions related to emergency care: Chest x-ray, which revealed no acute disease  Consultation: - Consulted or discussed management/test interpretation w/ external professional: N/A  Consideration for admission or further workup: Patient has no emergent conditions requiring admission or further work-up at this time and is stable for discharge home with primary care and oncology follow-up  Social Determinants of health: N/A    Amount and/or Complexity of Data Reviewed Labs: ordered. Radiology: ordered.  Risk OTC drugs. Prescription drug management.       Final diagnoses:  Radiation esophagitis  Hypokalemia  Hypomagnesemia  Pancytopenia [facility])    ED Discharge Orders          Ordered    alum & mag hydroxide-simeth (MAALOX MAX) 400-400-40 MG/5ML suspension  Every 6 hours PRN        06/01/24 1923    lidocaine  (XYLOCAINE ) 2 % solution  Every 4 hours PRN        06/01/24 1923    sucralfate (CARAFATE) 1 g tablet  4 times daily        06/01/24 1923               Kingsley, Roma Bondar K, DO 06/01/24 1929

## 2024-06-01 NOTE — Discharge Instructions (Addendum)
 You were seen in the emergency department for your throat and chest pain. This is likely due to inflammation of your esophagus from your radiation. Your potassium and magnesium  were slightly low otherwise you had no severe dehydration. You should drink plenty of fluids to make sure you're staying well hydrated.  You can continue to take Maalox and lidocaine  as needed for pain and you should take Carafate which is another antacid with meals in addition to your omeprazole .  You should follow-up with your oncologist in the next few days to have your symptoms and labs rechecked.  You should return to the emergency department if you are having worsening dehydration, you become lightheaded or pass out, you have fevers or if you have any other new or concerning symptoms.

## 2024-06-01 NOTE — ED Notes (Signed)
 Pt states the potassium is burning his throat and he can't drink it. Provider notified

## 2024-06-01 NOTE — ED Triage Notes (Signed)
 Patient to ED by POV with c/o difficulty swallowing, vomiting and diarrhea. He is a cancer patient and receives chemo every Monday and radiation 5 days a week.

## 2024-06-01 NOTE — ED Notes (Signed)
 Lab adding on add ons

## 2024-06-02 NOTE — Radiation Completion Notes (Signed)
  Radiation Oncology         [phone] ________________________________  Name: Charles Grimes MRN: [id number]  Date of Service: 05/29/2024  DOB: 1955/09/19  End of Treatment Note  Diagnosis:  At least Stage IIA-IIIA, cT2bN0-2,Mx, NSCLC, favor adenocarcinoma of the LUL.   Intent: Curative     ==========DELIVERED PLANS==========  First Treatment Date: 2024-04-14 Last Treatment Date: 2024-05-29   Plan Name: Lung_L Site: Lung, Left Technique: 3D Mode: Photon Dose Per Fraction: 2 Gy Prescribed Dose (Delivered / Prescribed): 60 Gy / 60 Gy Prescribed Fxs (Delivered / Prescribed): 30 / 30   Plan Name: Lung_L_Bst Site: Lung, Left Technique: 3D Mode: Photon Dose Per Fraction: 2 Gy Prescribed Dose (Delivered / Prescribed): 6 Gy / 6 Gy Prescribed Fxs (Delivered / Prescribed): 3 / 3     ==========ON TREATMENT VISIT DATES========== 2024-04-18, 2024-04-25, 2024-05-02, 2024-05-09, 2024-05-16, 2024-05-26, 2024-05-29      See weekly On Treatment Notes in Epic for details in the Media tab (listed as Progress notes on the On Treatment Visit Dates listed above). The patient tolerated radiation. He developed fatigue and anticipated esophagitis which was treated with carafate, and he was also using lidocaine  given by medical oncology.  The patient will receive a call in about one month from the radiation oncology department. He will continue follow up with Dr. Sherrod as well.      [name], PAC

## 2024-06-19 ENCOUNTER — Ambulatory Visit (HOSPITAL_COMMUNITY): Admission: RE | Admit: 2024-06-19 | Discharge: 2024-06-19 | Attending: Physician Assistant

## 2024-06-19 DIAGNOSIS — C3412 Malignant neoplasm of upper lobe, left bronchus or lung: Secondary | ICD-10-CM | POA: Diagnosis not present

## 2024-06-19 DIAGNOSIS — J432 Centrilobular emphysema: Secondary | ICD-10-CM | POA: Diagnosis not present

## 2024-06-19 DIAGNOSIS — C3490 Malignant neoplasm of unspecified part of unspecified bronchus or lung: Secondary | ICD-10-CM | POA: Diagnosis not present

## 2024-06-19 DIAGNOSIS — J90 Pleural effusion, not elsewhere classified: Secondary | ICD-10-CM | POA: Diagnosis not present

## 2024-06-19 MED ORDER — IOHEXOL 300 MG/ML  SOLN
75.0000 mL | Freq: Once | INTRAMUSCULAR | Status: AC | PRN
  Administered 2024-06-19: 75 mL via INTRAVENOUS

## 2024-06-19 MED ORDER — HEPARIN SOD (PORK) LOCK FLUSH 100 UNIT/ML IV SOLN
500.0000 [IU] | Freq: Once | INTRAVENOUS | Status: AC
  Administered 2024-06-19: 500 [IU] via INTRAVENOUS

## 2024-06-19 MED ORDER — HEPARIN SOD (PORK) LOCK FLUSH 100 UNIT/ML IV SOLN
INTRAVENOUS | Status: AC
  Filled 2024-06-19: qty 5

## 2024-06-19 MED ORDER — SODIUM CHLORIDE (PF) 0.9 % IJ SOLN
INTRAMUSCULAR | Status: AC
  Filled 2024-06-19: qty 50

## 2024-06-27 NOTE — Progress Notes (Signed)
 [facility] OFFICE PROGRESS NOTE  [name], DO [address] [place] [address]  DIAGNOSIS:  Stage IIIA (T3, N2, M0) non-small cell lung cancer, adenocarcinoma presented with left suprahilar mass in addition to suspicious subcarinal lymphadenopathy diagnosed in August 2025.   PDL1: 0%  Molecular studies:    PRIOR THERAPY: A course of concurrent chemoradiation with weekly carboplatin  for AUC 2 and paclitaxel  45 MGs/M2.  Status post 8 cycles.  Last dose of treatment on 05/26/2024  CURRENT THERAPY: Consolidation immunotherapy with Imfinzi 1500 mg IV every 4 weeks.  First dose expected on 07/09/2024.  INTERVAL HISTORY: Charles Grimes 68 y.o. male returns to the clinic today for a follow-up visit accompanied by her son and daughter in a law.   The patient was last seen in clinic by myself on 05/26/2024.  The patient completed a course of concurrent chemoradiation.  He tolerated this fairly well except he had some radiation-induced dysphagia which has improved at this time.  He was using Hycet for pain.  His appetite improved.   He is shortness of breath with exertion which he feels is about at his baseline.  He has some occasional intermittent cough produces a small amount of phlegm that is stable. He denies any hemoptysis.  He sometimes experiences right lateral rib soreness. It lasts about 10-15 minutes then improves without intervention. Tylenol  provides some relief.    He denies constipation. He denies diarrhea.  He denies nausea and vomiting.   He recently had a restaging CT scan.  He is here today for evaluation and to review his scan results and for a more detailed discussion about his current condition and treatment options.  MEDICAL HISTORY: Past Medical History:  Diagnosis Date   Anginal pain    Anxiety    Arthritis    Coronary artery disease, non-occlusive 08/2013   Mild to moderate (40% OM1) single vessel CAD. Otherwise  nonobstructed.   Depression    Dyslipidemia, goal LDL below 100    Essential hypertension    Family history of premature CAD    GERD (gastroesophageal reflux disease)    H/O skin disorder    Involving hands. Subsequently resolved.    Nonischemic cardiomyopathy (HCC) 05/2013   Non-ischemic: EF ~45% by Echo & Myoview  --> Non-obstructive CAD   Obesity (BMI 30.0-34.9)    OSA (obstructive sleep apnea) 06/21/2017   uses CPAP   Sleep apnea     ALLERGIES:  is allergic to ibuprofen, isosorbide  nitrate, oxycodone , and penicillins.  MEDICATIONS:  Current Outpatient Medications  Medication Sig Dispense Refill   arformoterol  (BROVANA ) 15 MCG/2ML NEBU Take 2 mLs (15 mcg total) by nebulization 2 (two) times daily. 120 mL 6   budesonide  (PULMICORT ) 0.25 MG/2ML nebulizer solution Take 2 mLs (0.25 mg total) by nebulization 2 (two) times daily. 60 mL 2   ipratropium (ATROVENT ) 0.02 % nebulizer solution Take 2.5 mLs (0.5 mg total) by nebulization 4 (four) times daily. 240 each 12   albuterol  (PROVENTIL ) (2.5 MG/3ML) 0.083% nebulizer solution Take 3 mLs (2.5 mg total) by nebulization every 6 (six) hours as needed for wheezing or shortness of breath. 75 mL 12   albuterol  (VENTOLIN  HFA) 108 (90 Base) MCG/ACT inhaler Inhale 2 puffs into the lungs every 6 (six) hours as needed for wheezing or shortness of breath. 8 g 2   ALPRAZolam  (XANAX ) 0.5 MG tablet Take 1 tablet (0.5 mg total) by mouth at bedtime as needed for anxiety. 30 tablet  0   alum & mag hydroxide-simeth (MAALOX MAX) 400-400-40 MG/5ML suspension Take 15 mLs by mouth every 6 (six) hours as needed for indigestion. 355 mL 0   aspirin  EC 81 MG tablet Take 81 mg by mouth daily.     atorvastatin  (LIPITOR) 10 MG tablet Take 1 tablet (10 mg total) by mouth daily. 90 tablet 3   budesonide -glycopyrrolate -formoterol (BREZTRI  AEROSPHERE) 160-9-4.8 MCG/ACT AERO inhaler Inhale 2 puffs into the lungs in the morning and at bedtime. 10.7 each 6    budesonide -glycopyrrolate -formoterol (BREZTRI  AEROSPHERE) 160-9-4.8 MCG/ACT AERO inhaler Inhale 2 puffs into the lungs in the morning and at bedtime.     clotrimazole  (MYCELEX ) 10 MG troche Take 1 tablet (10 mg total) by mouth 3 (three) times daily. 21 Troche 0   colchicine  0.6 MG tablet Take 1 tablet (0.6 mg total) by mouth daily as needed (gout flares).     docusate sodium  (COLACE) 100 MG capsule Take 1 capsule (100 mg total) by mouth every 12 (twelve) hours. 60 capsule 0   HYDROcodone -acetaminophen  (NORCO/VICODIN) 5-325 MG tablet Take 1 tablet by mouth every 6 (six) hours as needed for moderate pain (pain score 4-6). 25 tablet 0   lidocaine  (XYLOCAINE ) 2 % solution Use as directed 15 mLs in the mouth or throat every 6 (six) hours as needed for mouth pain. 100 mL 2   lidocaine  (XYLOCAINE ) 2 % solution Use as directed 15 mLs in the mouth or throat every 4 (four) hours as needed for mouth pain. 100 mL 0   lisinopril  (ZESTRIL ) 10 MG tablet Take 1 tablet (10 mg total) by mouth daily. 90 tablet 3   methylPREDNISolone  (MEDROL  DOSEPAK) 4 MG TBPK tablet Use as instructed 21 tablet 0   metoCLOPramide  (REGLAN ) 10 MG tablet Take 1 tablet (10 mg total) by mouth every 8 (eight) hours as needed for nausea. 30 tablet 0   Multiple Vitamins-Minerals (CENTRUM SILVER 50+MEN) TABS Take 1 tablet by mouth daily.     nitroGLYCERIN  (NITROSTAT ) 0.4 MG SL tablet Place 1 tablet (0.4 mg total) under the tongue every 5 (five) minutes as needed for chest pain. 30 tablet 1   NON FORMULARY Pt uses a c-pap nightly     omeprazole  (PRILOSEC ) 20 MG capsule Take 1 capsule (20 mg total) by mouth daily. 90 capsule 3   polyethylene glycol powder (MIRALAX ) 17 GM/SCOOP powder Take 17 g by mouth daily. Dissolve 1 capful (17g) in 4-8 ounces of liquid and take by mouth daily. 238 g 0   sertraline  (ZOLOFT ) 50 MG tablet Take 2 tabs in the morning and 1 tab in the evening. 270 tablet 3   Spacer/Aero-Holding Chambers (AEROCHAMBER PLUS) Device Per  patient request to help with inhaler management and effectiveness. 1 each 0   sucralfate  (CARAFATE ) 1 g tablet Take 1 tablet (1 g total) by mouth 4 (four) times daily. Dissolve each tablet in 15 cc water before use. 120 tablet 2   tiZANidine  (ZANAFLEX ) 4 MG tablet Take 1 tablet (4 mg total) by mouth at bedtime as needed for muscle spasms. 30 tablet 2   triamcinolone  cream (KENALOG ) 0.1 % Apply 1 Application topically 2 (two) times daily as needed (itch). 30 g 1   No current facility-administered medications for this visit.    SURGICAL HISTORY:  Past Surgical History:  Procedure Laterality Date   COLONOSCOPY  2015   COLONOSCOPY WITH PROPOFOL   01/02/2017   Dr.Pyrtle   IR IMAGING GUIDED PORT INSERTION  04/02/2024   LEFT HEART CATHETERIZATION WITH  CORONARY ANGIOGRAM  08/26/2013   Mild to moderate disease with 40-50% stenosis and OM1. Otherwise no significant CAD --  Surgeon: Alm LELON Clay, MD;  Location: [facility] CATH LAB;  Service: Cardiovascular;;   Lower extremity arterial Dopplers  06/11/2013   No occlusive disease   LUMBAR LAMINECTOMY/DECOMPRESSION MICRODISCECTOMY Right 10/07/2014   Procedure: LUMBAR LAMINECTOMY/DECOMPRESSION MICRODISCECTOMY 1 LEVEL;  Surgeon: Arley Helling, MD;  Location: [facility] NEURO ORS;  Service: Neurosurgery;  Laterality: Right;  Right L3-L4 Microdiscectomy   NM MYOVIEW  LTD  06/03/2013   Low risk study with no ischemia. EF roughly 44% with no regional wall motion abnormalities noted   PFTs  06/16/2013   Normal Volumes &  Spirometry; Moderately reduced DLCO   POLYPECTOMY     SHOULDER HEMI-ARTHROPLASTY Right 05/28/2015   Procedure: RIGHT SHOULDER HEMI-ARTHROPLASTY CTA HEAD AND SUBSCAP REPAIR ;  Surgeon: Marcey Her, MD;  Location: [facility] OR;  Service: Orthopedics;  Laterality: Right;   TRANSTHORACIC ECHOCARDIOGRAM  06/11/2013   Mildly reduced EF: 45-50%. Mild anterior hypokinesis with incoordinate septal motion. No evidence of pulmonary hypertension   VIDEO BRONCHOSCOPY WITH  ENDOBRONCHIAL ULTRASOUND Left 02/19/2024   Procedure: BRONCHOSCOPY, WITH EBUS;  Surgeon: Shelah Lamar RAMAN, MD;  Location: [facility] ENDOSCOPY;  Service: Pulmonary;  Laterality: Left;    REVIEW OF SYSTEMS:   Review of Systems  Constitutional: Negative for appetite change, chills, fatigue, fever and unexpected weight change.  HENT:   Negative for mouth sores, nosebleeds, sore throat and trouble swallowing.   Eyes: Negative for eye problems and icterus.  Respiratory: Stable intermittent shortness of breath and intermittent cough negative for hemoptysis and wheezing.   Cardiovascular: Positive for intermittent right rib pain. Negative for leg swelling.  Gastrointestinal: Negative for abdominal pain, constipation, diarrhea, nausea and vomiting.  Genitourinary: Negative for bladder incontinence, difficulty urinating, dysuria, frequency and hematuria.   Musculoskeletal: Negative for back pain, gait problem, neck pain and neck stiffness.  Skin: Negative for itching and rash.  Neurological: Negative for dizziness, extremity weakness, gait problem, headaches, light-headedness and seizures.  Hematological: Negative for adenopathy. Does not bruise/bleed easily.  Psychiatric/Behavioral: Negative for confusion, depression and sleep disturbance. The patient is not nervous/anxious.     PHYSICAL EXAMINATION:  Blood pressure 112/76, pulse 74, temperature 97.7 F (36.5 C), temperature source Temporal, resp. rate 14, weight 175 lb (79.4 kg), SpO2 99%.  ECOG PERFORMANCE STATUS: 1  Physical Exam  Constitutional: Oriented to person, place, and time and well-developed, well-nourished, and in no distress.  HENT:  Head: Normocephalic and atraumatic.  Mouth/Throat: Oropharynx is clear and moist. No oropharyngeal exudate.  Eyes: Conjunctivae are normal. Right eye exhibits no discharge. Left eye exhibits no discharge. No scleral icterus.  Neck: Normal range of motion. Neck supple.  Cardiovascular: Normal rate, regular  rhythm, normal heart sounds and intact distal pulses.   Pulmonary/Chest: Effort normal and breath sounds normal. No respiratory distress. No wheezes. No rales.  Abdominal: Soft. Bowel sounds are normal. Exhibits no distension and no mass. There is no tenderness.  Musculoskeletal: Normal range of motion. Exhibits no edema.  Lymphadenopathy:    No cervical adenopathy.  Neurological: Alert and oriented to person, place, and time. Exhibits normal muscle tone. Gait normal. Coordination normal.  Skin: Skin is warm and dry. No rash noted. Not diaphoretic. No erythema. No pallor.  Psychiatric: Mood, memory and judgment normal.  Vitals reviewed.  LABORATORY DATA: Lab Results  Component Value Date   WBC 3.5 (L) 07/01/2024   HGB 9.5 (L) 07/01/2024   HCT 29.0 (  L) 07/01/2024   MCV 97.6 07/01/2024   PLT 87 (L) 07/01/2024      Chemistry      Component Value Date/Time   NA 138 07/01/2024 1405   K 3.9 07/01/2024 1405   CL 106 07/01/2024 1405   CO2 25 07/01/2024 1405   BUN 15 07/01/2024 1405   CREATININE 0.83 07/01/2024 1405   CREATININE 0.84 04/30/2015 1738      Component Value Date/Time   CALCIUM  8.6 (L) 07/01/2024 1405   ALKPHOS 120 07/01/2024 1405   AST 16 07/01/2024 1405   ALT 8 07/01/2024 1405   BILITOT 0.5 07/01/2024 1405       RADIOGRAPHIC STUDIES:  CT Chest W Contrast Result Date: 06/23/2024 CLINICAL DATA:  Left suprahilar adenocarcinoma status post concurrent chemoradiation. * Tracking Code: BO * EXAM: CT CHEST WITH CONTRAST TECHNIQUE: Multidetector CT imaging of the chest was performed during intravenous contrast administration. RADIATION DOSE REDUCTION: This exam was performed according to the departmental dose-optimization program which includes automated exposure control, adjustment of the mA and/or kV according to patient size and/or use of iterative reconstruction technique. CONTRAST:  75mL OMNIPAQUE  IOHEXOL  300 MG/ML  SOLN COMPARISON:  CTA chest dated 04/27/2024, CT chest  dated 01/21/2024 and priors FINDINGS: Cardiovascular: Right chest wall port tip terminates at the superior cavoatrial junction. Normal heart size. No significant pericardial fluid/thickening. Great vessels are normal in course and caliber. No central pulmonary emboli. Coronary artery calcifications and aortic atherosclerosis. Mediastinum/Nodes: Imaged thyroid gland without nodules meeting criteria for imaging follow-up by size. Normal esophagus. No pathologically enlarged axillary, supraclavicular, mediastinal, or hilar lymph nodes. Lungs/Pleura: The central airways are patent. Moderate centrilobular and paraseptal emphysema with irregular bulla in the bilateral lung bases. Mild bilateral lower lobe interlobular septal thickening. Interval decrease in size of left hilar mass encasing the pulmonary arteries measuring 4.0 x 2.9 cm (7:62), previously 4.9 x 4.4 cm on 04/27/2024 (remeasured). Unchanged appearance of endoluminal density extending into the left upper lobe airways (7:51). 5 x 3 mm subpleural left lower lobe nodule (7:86) is new from 05/05/2024. Remainder of pulmonary nodules are unchanged dating back to 02/16/2020: -6 x 5 mm proteinuria right upper lobe (7:39) -8 x 4 mm triangular subpleural right middle lobe (7:110). No pneumothorax. Trace bilateral pleural effusions, left-greater-than-right. Upper abdomen: Normal. Musculoskeletal: No acute or abnormal lytic or blastic osseous lesions. Multilevel degenerative changes of the thoracic spine. Partially imaged right shoulder arthroplasty. Mild asymmetric atrophy of the muscle surrounding the right shoulder. IMPRESSION: 1. Interval decrease in size of left hilar mass encasing the pulmonary arteries, consistent with treatment response. 2. New 4 mm subpleural left lower lobe nodule, indeterminate. 3. Remainder of pulmonary nodules are unchanged dating back to 02/16/2020. 4. Trace bilateral pleural effusions, left-greater-than-right. 5. Aortic Atherosclerosis  (ICD10-I70.0) and Emphysema (ICD10-J43.9). Coronary artery calcifications. Assessment for potential risk factor modification, dietary therapy or pharmacologic therapy may be warranted, if clinically indicated. Electronically Signed   By: Limin  Xu M.D.   On: 06/23/2024 19:44   DG Chest Port 1 View Result Date: 06/01/2024 CLINICAL DATA:  Difficulty swallowing with vomiting and diarrhea. EXAM: PORTABLE CHEST 1 VIEW COMPARISON:  None Available. FINDINGS: There is stable right-sided venous Port-A-Cath positioning. The heart size and mediastinal contours are within normal limits. Both lungs are clear. There is evidence of prior right shoulder arthroplasty. The visualized skeletal structures are unremarkable. IMPRESSION: No active disease. Electronically Signed   By: Suzen Dials M.D.   On: 06/01/2024 18:39     ASSESSMENT/PLAN:  This is a very pleasant 68 year old Caucasian male diagnosed with stage IIIa (T3, N2, M0) non-small cell lung cancer, adenocarcinoma.  The patient presented with a left suprahilar mass in addition to suspicious subcarinal lymphadenopathy.  The patient was diagnosed in August 2025.   The patient completed a course of concurrent chemoradiation with weekly carboplatin  for an AUC of 2 and paclitaxel  45 mg/m.  He is status post 8 treatments.  Treatment has been held on occasion due to thrombocytopenia.  The patient was seen with Dr. Sherrod today.  Dr. Sherrod personally and independently reviewed the scan and discussed results with the patient today.  The scan showed positive response to treatment.  Dr. Sherrod recommends    Dr. Sherrod recommended consolidation immunotherapy with Imfinzi 1500 mg IV every 4 weeks for a total of 1 year as long as no unacceptable toxicity or disease progression.  First dose expected on 07/09/2024.  The patient is interested in this option and he is expected to undergo his first cycle of treatment next week.  Will see him back for labs and a  follow-up visit in 5 weeks before undergoing cycle #2.  I discussed with her the adverse effect of the immunotherapy including but not limited to immunotherapy mediated skin rash, diarrhea, inflammation of the lung, kidney, liver, thyroid or other endocrine dysfunction  The patient was advised to call immediately if she has any concerning symptoms in the interval. The patient voices understanding of current disease status and treatment options and is in agreement with the current care plan. All questions were answered. The patient knows to call the clinic with any problems, questions or concerns. We can certainly see the patient much sooner if necessary    No orders of the defined types were placed in this encounter.     Lafawn Lenoir L Elliett Guarisco, PA-C 07/01/2024  ADDENDUM: Hematology/Oncology Attending:  I had a face-to-face encounter with the patient today.  I reviewed his record, lab, scan and recommended his care plan.  This is a very pleasant 68 years old white male with stage IIIa non-small cell lung cancer, adenocarcinoma diagnosed in August 2025 with no actionable mutation and PD-L1 expression of 0%.  The patient underwent a course of concurrent chemoradiation with weekly carboplatin  and paclitaxel .  He tolerated his treatment well with no concerning adverse effects except for the fatigue as well as radiation-induced odynophagia and dysphagia.  He is improving and gaining more weight and improvement of his appetite. He had repeat CT scan of the chest performed recently.  I personally independently reviewed the scan images and discussed the result with the patient and his family.  His scan showed partial response to this treatment. I gave the patient the option of continuous observation and monitoring versus consideration of consolidation immunotherapy with durvalumab 1500 mg IV every 4 weeks for a total of 1 year. I discussed with the patient the adverse effect as well as the benefit of  the treatment with consolidation immunotherapy and he is interested in proceeding with the treatment as planned.  He is expected to start the first dose of this treatment next week. The patient will come back for follow-up visit in 5 weeks for evaluation with the start of cycle #2. He was advised to call immediately if he has any other concerning symptoms in the interval. Disclaimer: This note was dictated with voice recognition software. Similar sounding words can inadvertently be transcribed and may be missed upon review. Sherrod MARLA Sherrod, MD

## 2024-07-01 ENCOUNTER — Inpatient Hospital Stay: Attending: Internal Medicine | Admitting: Physician Assistant

## 2024-07-01 ENCOUNTER — Inpatient Hospital Stay: Attending: Internal Medicine

## 2024-07-01 ENCOUNTER — Other Ambulatory Visit: Payer: Self-pay | Admitting: Physician Assistant

## 2024-07-01 VITALS — BP 112/76 | HR 74 | Temp 97.7°F | Resp 14 | Wt 175.0 lb

## 2024-07-01 DIAGNOSIS — Z7951 Long term (current) use of inhaled steroids: Secondary | ICD-10-CM | POA: Insufficient documentation

## 2024-07-01 DIAGNOSIS — Z9221 Personal history of antineoplastic chemotherapy: Secondary | ICD-10-CM | POA: Diagnosis not present

## 2024-07-01 DIAGNOSIS — C3412 Malignant neoplasm of upper lobe, left bronchus or lung: Secondary | ICD-10-CM | POA: Diagnosis not present

## 2024-07-01 DIAGNOSIS — Z885 Allergy status to narcotic agent status: Secondary | ICD-10-CM | POA: Diagnosis not present

## 2024-07-01 DIAGNOSIS — Z923 Personal history of irradiation: Secondary | ICD-10-CM | POA: Insufficient documentation

## 2024-07-01 DIAGNOSIS — Z7982 Long term (current) use of aspirin: Secondary | ICD-10-CM | POA: Insufficient documentation

## 2024-07-01 DIAGNOSIS — Z79899 Other long term (current) drug therapy: Secondary | ICD-10-CM | POA: Diagnosis not present

## 2024-07-01 DIAGNOSIS — D696 Thrombocytopenia, unspecified: Secondary | ICD-10-CM | POA: Insufficient documentation

## 2024-07-01 DIAGNOSIS — Z880 Allergy status to penicillin: Secondary | ICD-10-CM | POA: Diagnosis not present

## 2024-07-01 DIAGNOSIS — I10 Essential (primary) hypertension: Secondary | ICD-10-CM | POA: Diagnosis not present

## 2024-07-01 DIAGNOSIS — Z886 Allergy status to analgesic agent status: Secondary | ICD-10-CM | POA: Diagnosis not present

## 2024-07-01 DIAGNOSIS — Z5112 Encounter for antineoplastic immunotherapy: Secondary | ICD-10-CM | POA: Diagnosis present

## 2024-07-01 LAB — CBC WITH DIFFERENTIAL (CANCER CENTER ONLY)
Abs Immature Granulocytes: 0.01 K/uL (ref 0.00–0.07)
Basophils Absolute: 0 K/uL (ref 0.0–0.1)
Basophils Relative: 1 %
Eosinophils Absolute: 0.1 K/uL (ref 0.0–0.5)
Eosinophils Relative: 2 %
HCT: 29 % — ABNORMAL LOW (ref 39.0–52.0)
Hemoglobin: 9.5 g/dL — ABNORMAL LOW (ref 13.0–17.0)
Immature Granulocytes: 0 %
Lymphocytes Relative: 21 %
Lymphs Abs: 0.7 K/uL (ref 0.7–4.0)
MCH: 32 pg (ref 26.0–34.0)
MCHC: 32.8 g/dL (ref 30.0–36.0)
MCV: 97.6 fL (ref 80.0–100.0)
Monocytes Absolute: 0.3 K/uL (ref 0.1–1.0)
Monocytes Relative: 8 %
Neutro Abs: 2.4 K/uL (ref 1.7–7.7)
Neutrophils Relative %: 68 %
Platelet Count: 87 K/uL — ABNORMAL LOW (ref 150–400)
RBC: 2.97 MIL/uL — ABNORMAL LOW (ref 4.22–5.81)
RDW: 19.6 % — ABNORMAL HIGH (ref 11.5–15.5)
WBC Count: 3.5 K/uL — ABNORMAL LOW (ref 4.0–10.5)
nRBC: 0 % (ref 0.0–0.2)

## 2024-07-01 LAB — CMP (CANCER CENTER ONLY)
ALT: 8 U/L (ref 0–44)
AST: 16 U/L (ref 15–41)
Albumin: 3.7 g/dL (ref 3.5–5.0)
Alkaline Phosphatase: 120 U/L (ref 38–126)
Anion gap: 8 (ref 5–15)
BUN: 15 mg/dL (ref 8–23)
CO2: 25 mmol/L (ref 22–32)
Calcium: 8.6 mg/dL — ABNORMAL LOW (ref 8.9–10.3)
Chloride: 106 mmol/L (ref 98–111)
Creatinine: 0.83 mg/dL (ref 0.61–1.24)
GFR, Estimated: >60 mL/min (ref >60)
Glucose, Bld: 104 mg/dL — ABNORMAL HIGH (ref 70–99)
Potassium: 3.9 mmol/L (ref 3.5–5.1)
Sodium: 138 mmol/L (ref 135–145)
Total Bilirubin: 0.5 mg/dL (ref 0.0–1.2)
Total Protein: 7 g/dL (ref 6.5–8.1)

## 2024-07-01 NOTE — Patient Instructions (Signed)
-  We covered a lot of important information at your appointment today regarding what the treatment plan is moving forward. Here are the the main points that were discussed at your office visit with us  today:  -The treatment will consist of a new medication. This is not chemotherapy. This new drug is a type of Immunotherapy called Imfinzi (Durvalumab).  -We are planning on starting your treatment next week on 07/09/24  -Your treatment will be given once every 4 weeks. You will receive this treatment every four weeks for a total of 1 year (13 total treatments) unless you experience unacceptable toxicity or if there is evidence on your routine CT scans that the cancer is growing  -We will get a CT scan after every 3 treatments to check on the progress of treatment  Side Effects:  -The adverse effect of the immunotherapy including but not limited to immunotherapy mediated skin rash, diarrhea, inflammation of the lung, kidney, liver, thyroid or other endocrine dysfunction  Follow up:  -We will see you back for a follow up visit in __.

## 2024-07-01 NOTE — Progress Notes (Signed)
 DISCONTINUE ON PATHWAY REGIMEN - Non-Small Cell Lung     A cycle is every 7 days, concurrent with RT:     Paclitaxel       Carboplatin    **Always confirm dose/schedule in your pharmacy ordering system**  PRIOR TREATMENT: [id number]: Carboplatin  AUC=2 + Paclitaxel  45 mg/m2 Weekly During Radiation  START ON PATHWAY REGIMEN - Non-Small Cell Lung     A cycle is every 28 days:     Durvalumab   **Always confirm dose/schedule in your pharmacy ordering system**  Patient Characteristics: Preoperative or Nonsurgical Candidate (Clinical Staging), Stage IIB (N2a only) or Stage III - Nonsurgical Candidate, PS = 0,1 Therapeutic Status: Preoperative or Nonsurgical Candidate (Clinical Staging) Check here if patient was staged using an edition other than AJCC Staging 9th Edition: false AJCC T Category: cT3 AJCC N Category: cN2a AJCC M Category: cM0 AJCC 9 Stage Grouping: IIIA ECOG Performance Status: 1 Intent of Therapy: Curative Intent, Discussed with Patient

## 2024-07-03 ENCOUNTER — Encounter: Payer: Self-pay | Admitting: Acute Care

## 2024-07-07 NOTE — Progress Notes (Signed)
 Pharmacist Chemotherapy Monitoring - Initial Assessment    Anticipated start date: 07/08/24   The following has been reviewed per standard work regarding the patient's treatment regimen: The patient's diagnosis, treatment plan and drug doses, and organ/hematologic function Lab orders and baseline tests specific to treatment regimen  The treatment plan start date, drug sequencing, and pre-medications Prior authorization status  Patient's documented medication list, including drug-drug interaction screen and prescriptions for anti-emetics and supportive care specific to the treatment regimen The drug concentrations, fluid compatibility, administration routes, and timing of the medications to be used The patient's access for treatment and lifetime cumulative dose history, if applicable  The patient's medication allergies and previous infusion related reactions, if applicable   Changes made to treatment plan:  N/A  Follow up needed:  N/A  Harlene JONELLE Nasuti, RPH, 07/07/2024  11:38 AM

## 2024-07-08 ENCOUNTER — Inpatient Hospital Stay

## 2024-07-08 ENCOUNTER — Encounter: Payer: Self-pay | Admitting: Internal Medicine

## 2024-07-08 VITALS — BP 101/67 | HR 84 | Temp 97.4°F | Resp 18 | Ht 71.0 in | Wt 170.8 lb

## 2024-07-08 DIAGNOSIS — C3412 Malignant neoplasm of upper lobe, left bronchus or lung: Secondary | ICD-10-CM

## 2024-07-08 DIAGNOSIS — Z5112 Encounter for antineoplastic immunotherapy: Secondary | ICD-10-CM | POA: Diagnosis not present

## 2024-07-08 LAB — CBC WITH DIFFERENTIAL (CANCER CENTER ONLY)
Abs Immature Granulocytes: 0.01 K/uL (ref 0.00–0.07)
Basophils Absolute: 0 K/uL (ref 0.0–0.1)
Basophils Relative: 1 %
Eosinophils Absolute: 0 K/uL (ref 0.0–0.5)
Eosinophils Relative: 1 %
HCT: 31 % — ABNORMAL LOW (ref 39.0–52.0)
Hemoglobin: 10.4 g/dL — ABNORMAL LOW (ref 13.0–17.0)
Immature Granulocytes: 0 %
Lymphocytes Relative: 22 %
Lymphs Abs: 0.9 K/uL (ref 0.7–4.0)
MCH: 33.1 pg (ref 26.0–34.0)
MCHC: 33.5 g/dL (ref 30.0–36.0)
MCV: 98.7 fL (ref 80.0–100.0)
Monocytes Absolute: 0.4 K/uL (ref 0.1–1.0)
Monocytes Relative: 9 %
Neutro Abs: 2.9 K/uL (ref 1.7–7.7)
Neutrophils Relative %: 67 %
Platelet Count: 152 K/uL (ref 150–400)
RBC: 3.14 MIL/uL — ABNORMAL LOW (ref 4.22–5.81)
RDW: 18.4 % — ABNORMAL HIGH (ref 11.5–15.5)
WBC Count: 4.2 K/uL (ref 4.0–10.5)
nRBC: 0 % (ref 0.0–0.2)

## 2024-07-08 LAB — CMP (CANCER CENTER ONLY)
ALT: 9 U/L (ref 0–44)
AST: 18 U/L (ref 15–41)
Albumin: 3.9 g/dL (ref 3.5–5.0)
Alkaline Phosphatase: 130 U/L — ABNORMAL HIGH (ref 38–126)
Anion gap: 10 (ref 5–15)
BUN: 23 mg/dL (ref 8–23)
CO2: 23 mmol/L (ref 22–32)
Calcium: 8.7 mg/dL — ABNORMAL LOW (ref 8.9–10.3)
Chloride: 106 mmol/L (ref 98–111)
Creatinine: 1.25 mg/dL — ABNORMAL HIGH (ref 0.61–1.24)
GFR, Estimated: >60 mL/min
Glucose, Bld: 132 mg/dL — ABNORMAL HIGH (ref 70–99)
Potassium: 4.3 mmol/L (ref 3.5–5.1)
Sodium: 140 mmol/L (ref 135–145)
Total Bilirubin: 0.5 mg/dL (ref 0.0–1.2)
Total Protein: 7.4 g/dL (ref 6.5–8.1)

## 2024-07-08 LAB — TSH: TSH: 0.585 u[IU]/mL (ref 0.350–4.500)

## 2024-07-08 MED ORDER — SODIUM CHLORIDE 0.9 % IV SOLN: INTRAVENOUS | Status: DC

## 2024-07-08 MED ORDER — SODIUM CHLORIDE 0.9 % IV SOLN
1500.0000 mg | Freq: Once | INTRAVENOUS | Status: AC
  Administered 2024-07-08: 1500 mg via INTRAVENOUS
  Filled 2024-07-08: qty 30

## 2024-07-08 NOTE — Patient Instructions (Signed)
 [facility] - A DEPT OF [facility]  Discharge Instructions: Thank you for choosing [facility] to provide your oncology and hematology care.   If you have a lab appointment with the Cancer Center, please go directly to the Cancer Center and check in at the registration area.   Wear comfortable clothing and clothing appropriate for easy access to any Portacath or PICC line.   We strive to give you quality time with your provider. You may need to reschedule your appointment if you arrive late (15 or more minutes).  Arriving late affects you and other patients whose appointments are after yours.  Also, if you miss three or more appointments without notifying the office, you may be dismissed from the clinic at the provider's discretion.      For prescription refill requests, have your pharmacy contact our office and allow 72 hours for refills to be completed.    Today you received the following chemotherapy and/or immunotherapy agents: durvalumab (IMFINZI)       To help prevent nausea and vomiting after your treatment, we encourage you to take your nausea medication as directed.  BELOW ARE SYMPTOMS THAT SHOULD BE REPORTED IMMEDIATELY: *FEVER GREATER THAN 100.4 F (38 C) OR HIGHER *CHILLS OR SWEATING *NAUSEA AND VOMITING THAT IS NOT CONTROLLED WITH YOUR NAUSEA MEDICATION *UNUSUAL SHORTNESS OF BREATH *UNUSUAL BRUISING OR BLEEDING *URINARY PROBLEMS (pain or burning when urinating, or frequent urination) *BOWEL PROBLEMS (unusual diarrhea, constipation, pain near the anus) TENDERNESS IN MOUTH AND THROAT WITH OR WITHOUT PRESENCE OF ULCERS (sore throat, sores in mouth, or a toothache) UNUSUAL RASH, SWELLING OR PAIN  UNUSUAL VAGINAL DISCHARGE OR ITCHING   Items with * indicate a potential emergency and should be followed up as soon as possible or go to the Emergency Department if any problems should occur.  Please show the CHEMOTHERAPY ALERT CARD or  IMMUNOTHERAPY ALERT CARD at check-in to the Emergency Department and triage nurse.  Should you have questions after your visit or need to cancel or reschedule your appointment, please contact [facility] - A DEPT OF [facility]  Dept: [phone]  and follow the prompts.  Office hours are 8:00 a.m. to 4:30 p.m. Monday - Friday. Please note that voicemails left after 4:00 p.m. may not be returned until the following business day.  We are closed weekends and major holidays. You have access to a nurse at all times for urgent questions. Please call the main number to the clinic Dept: [phone] and follow the prompts.   For any non-urgent questions, you may also contact your provider using MyChart. We now offer e-Visits for anyone 39 and older to request care online for non-urgent symptoms. For details visit [email or web address].   Also download the MyChart app! Go to the app store, search "MyChart", open the app, select [place], and log in with your MyChart username and password.

## 2024-07-09 LAB — T4: T4, Total: 6 ug/dL (ref 4.5–12.0)

## 2024-07-20 ENCOUNTER — Emergency Department (HOSPITAL_COMMUNITY)
Admission: EM | Admit: 2024-07-20 | Discharge: 2024-07-21 | Disposition: A | Discharge status: Home / Self Care | Source: Home / Self Care | Attending: Emergency Medicine | Admitting: Emergency Medicine

## 2024-07-20 ENCOUNTER — Other Ambulatory Visit: Payer: Self-pay

## 2024-07-20 ENCOUNTER — Emergency Department (HOSPITAL_COMMUNITY)

## 2024-07-20 DIAGNOSIS — R059 Cough, unspecified: Secondary | ICD-10-CM | POA: Insufficient documentation

## 2024-07-20 DIAGNOSIS — R0609 Other forms of dyspnea: Secondary | ICD-10-CM | POA: Insufficient documentation

## 2024-07-20 DIAGNOSIS — R112 Nausea with vomiting, unspecified: Secondary | ICD-10-CM | POA: Insufficient documentation

## 2024-07-20 DIAGNOSIS — R110 Nausea: Secondary | ICD-10-CM

## 2024-07-20 DIAGNOSIS — Z7982 Long term (current) use of aspirin: Secondary | ICD-10-CM | POA: Insufficient documentation

## 2024-07-20 LAB — CBC WITH DIFFERENTIAL/PLATELET
Abs Immature Granulocytes: 0.01 K/uL (ref 0.00–0.07)
Basophils Absolute: 0 K/uL (ref 0.0–0.1)
Basophils Relative: 0 %
Eosinophils Absolute: 0 K/uL (ref 0.0–0.5)
Eosinophils Relative: 0 %
HCT: 37.6 % — ABNORMAL LOW (ref 39.0–52.0)
Hemoglobin: 12.3 g/dL — ABNORMAL LOW (ref 13.0–17.0)
Immature Granulocytes: 0 %
Lymphocytes Relative: 12 %
Lymphs Abs: 0.6 K/uL — ABNORMAL LOW (ref 0.7–4.0)
MCH: 32.9 pg (ref 26.0–34.0)
MCHC: 32.7 g/dL (ref 30.0–36.0)
MCV: 100.5 fL — ABNORMAL HIGH (ref 80.0–100.0)
Monocytes Absolute: 0.4 K/uL (ref 0.1–1.0)
Monocytes Relative: 8 %
Neutro Abs: 3.5 K/uL (ref 1.7–7.7)
Neutrophils Relative %: 80 %
Platelets: 150 K/uL (ref 150–400)
RBC: 3.74 MIL/uL — ABNORMAL LOW (ref 4.22–5.81)
RDW: 16.4 % — ABNORMAL HIGH (ref 11.5–15.5)
WBC: 4.5 K/uL (ref 4.0–10.5)
nRBC: 0 % (ref 0.0–0.2)

## 2024-07-20 LAB — COMPREHENSIVE METABOLIC PANEL WITH GFR
ALT: 12 U/L (ref 0–44)
AST: 27 U/L (ref 15–41)
Albumin: 3.7 g/dL (ref 3.5–5.0)
Alkaline Phosphatase: 124 U/L (ref 38–126)
Anion gap: 11 (ref 5–15)
BUN: 19 mg/dL (ref 8–23)
CO2: 23 mmol/L (ref 22–32)
Calcium: 9 mg/dL (ref 8.9–10.3)
Chloride: 105 mmol/L (ref 98–111)
Creatinine, Ser: 0.82 mg/dL (ref 0.61–1.24)
GFR, Estimated: >60 mL/min
Glucose, Bld: 90 mg/dL (ref 70–99)
Potassium: 4.4 mmol/L (ref 3.5–5.1)
Sodium: 139 mmol/L (ref 135–145)
Total Bilirubin: 0.6 mg/dL (ref 0.0–1.2)
Total Protein: 7.4 g/dL (ref 6.5–8.1)

## 2024-07-20 LAB — RESP PANEL BY RT-PCR (RSV, FLU A&B, COVID)  RVPGX2
Influenza A by PCR: NEGATIVE
Influenza B by PCR: NEGATIVE
Resp Syncytial Virus by PCR: NEGATIVE
SARS Coronavirus 2 by RT PCR: NEGATIVE

## 2024-07-20 LAB — LIPASE, BLOOD: Lipase: 12 U/L (ref 11–51)

## 2024-07-20 MED ORDER — ACETAMINOPHEN 325 MG PO TABS
650.0000 mg | ORAL_TABLET | Freq: Once | ORAL | Status: AC
  Administered 2024-07-20: 650 mg via ORAL
  Filled 2024-07-20: qty 2

## 2024-07-20 MED ORDER — METOCLOPRAMIDE HCL 5 MG/ML IJ SOLN
10.0000 mg | Freq: Once | INTRAMUSCULAR | Status: AC
  Administered 2024-07-20: 10 mg via INTRAVENOUS
  Filled 2024-07-20: qty 2

## 2024-07-20 MED ORDER — LACTATED RINGERS IV BOLUS
1000.0000 mL | Freq: Once | INTRAVENOUS | Status: AC
  Administered 2024-07-20: 1000 mL via INTRAVENOUS

## 2024-07-20 MED ORDER — PROCHLORPERAZINE EDISYLATE 10 MG/2ML IJ SOLN
10.0000 mg | Freq: Once | INTRAMUSCULAR | Status: AC
  Administered 2024-07-20: 10 mg via INTRAVENOUS
  Filled 2024-07-20: qty 2

## 2024-07-20 NOTE — ED Provider Notes (Signed)
 " [place] EMERGENCY DEPARTMENT AT [facility] Provider Note   CSN: [id number] Arrival date & time: 07/20/24  2102     Patient presents with: Nausea and Emesis   Charles Grimes is a 69 y.o. male.   Patient is a 69 year old male with a past medical history of lung cancer status post chemo and radiation and now recently started on immunotherapy presenting to the emergency department with nausea and vomiting.  Patient states that he started to feel sick on Friday with nausea and vomiting and has been unable to keep anything down since then.  He states that his abdomen feels sore from vomiting but he is not having any abdominal pain.  He states he has not had a bowel movement since Tuesday but is still passing gas.  He denies any fevers.  He states that he had started to have some shortness of breath on exertion.  He denies any chest pain.  He states he has a mild nonproductive cough.  He denies any fever.  The history is provided by the patient and a relative.  Emesis      Prior to Admission medications  Medication Sig Start Date End Date Taking? Authorizing Provider  arformoterol  (BROVANA ) 15 MCG/2ML NEBU Take 2 mLs (15 mcg total) by nebulization 2 (two) times daily. 05/01/24   Ruthell Lauraine FALCON, NP  budesonide  (PULMICORT ) 0.25 MG/2ML nebulizer solution Take 2 mLs (0.25 mg total) by nebulization 2 (two) times daily. 05/01/24 05/01/25  Ruthell Lauraine FALCON, NP  ipratropium (ATROVENT ) 0.02 % nebulizer solution Take 2.5 mLs (0.5 mg total) by nebulization 4 (four) times daily. 05/01/24   Ruthell Lauraine FALCON, NP  albuterol  (PROVENTIL ) (2.5 MG/3ML) 0.083% nebulizer solution Take 3 mLs (2.5 mg total) by nebulization every 6 (six) hours as needed for wheezing or shortness of breath. 04/27/24   Melvenia Motto, MD  albuterol  (VENTOLIN  HFA) 108 (90 Base) MCG/ACT inhaler Inhale 2 puffs into the lungs every 6 (six) hours as needed for wheezing or shortness of breath. 02/25/24   Ruthell Lauraine FALCON, NP  ALPRAZolam   (XANAX ) 0.5 MG tablet Take 1 tablet (0.5 mg total) by mouth at bedtime as needed for anxiety. 10/24/23   [name], DO  alum & mag hydroxide-simeth (MAALOX MAX) 400-400-40 MG/5ML suspension Take 15 mLs by mouth every 6 (six) hours as needed for indigestion. 06/01/24   Kingsley, Alanni Vader K, DO  aspirin  EC 81 MG tablet Take 81 mg by mouth daily.    [provider]  atorvastatin  (LIPITOR) 10 MG tablet Take 1 tablet (10 mg total) by mouth daily. 10/24/23   [name], DO  budesonide -glycopyrrolate -formoterol (BREZTRI  AEROSPHERE) 160-9-4.8 MCG/ACT AERO inhaler Inhale 2 puffs into the lungs in the morning and at bedtime. 02/25/24   Ruthell Lauraine FALCON, NP  budesonide -glycopyrrolate -formoterol (BREZTRI  AEROSPHERE) 160-9-4.8 MCG/ACT AERO inhaler Inhale 2 puffs into the lungs in the morning and at bedtime. 02/25/24   Ruthell Lauraine FALCON, NP  clotrimazole  (MYCELEX ) 10 MG troche Take 1 tablet (10 mg total) by mouth 3 (three) times daily. 02/25/24   Ruthell Lauraine FALCON, NP  colchicine  0.6 MG tablet Take 1 tablet (0.6 mg total) by mouth daily as needed (gout flares). 02/19/24   Shelah Lamar RAMAN, MD  docusate sodium  (COLACE) 100 MG capsule Take 1 capsule (100 mg total) by mouth every 12 (twelve) hours. 04/14/24   Mannie Fairy DASEN, DO  HYDROcodone -acetaminophen  (NORCO/VICODIN) 5-325 MG tablet Take 1 tablet by mouth every 6 (six) hours as needed for  moderate pain (pain score 4-6). 05/26/24   Shannon Agent, MD  lidocaine  (XYLOCAINE ) 2 % solution Use as directed 15 mLs in the mouth or throat every 6 (six) hours as needed for mouth pain. 04/28/24   Sherrod Sherrod, MD  lidocaine  (XYLOCAINE ) 2 % solution Use as directed 15 mLs in the mouth or throat every 4 (four) hours as needed for mouth pain. 06/01/24   Kingsley, Mariann Palo K, DO  lisinopril  (ZESTRIL ) 10 MG tablet Take 1 tablet (10 mg total) by mouth daily. 01/14/24   [name], DO  methylPREDNISolone  (MEDROL  DOSEPAK) 4 MG TBPK tablet Use as  instructed 05/12/24   Sherrod Sherrod, MD  metoCLOPramide  (REGLAN ) 10 MG tablet Take 1 tablet (10 mg total) by mouth every 8 (eight) hours as needed for nausea. 04/27/24   Melvenia Motto, MD  Multiple Vitamins-Minerals (CENTRUM SILVER 50+MEN) TABS Take 1 tablet by mouth daily.    [provider]  nitroGLYCERIN  (NITROSTAT ) 0.4 MG SL tablet Place 1 tablet (0.4 mg total) under the tongue every 5 (five) minutes as needed for chest pain. 02/08/21   [name], DO  NON FORMULARY Pt uses a c-pap nightly    [provider]  omeprazole  (PRILOSEC ) 20 MG capsule Take 1 capsule (20 mg total) by mouth daily. 01/14/24   [name], DO  polyethylene glycol powder (MIRALAX ) 17 GM/SCOOP powder Take 17 g by mouth daily. Dissolve 1 capful (17g) in 4-8 ounces of liquid and take by mouth daily. 04/14/24   Mannie Fairy DASEN, DO  sertraline  (ZOLOFT ) 50 MG tablet Take 2 tabs in the morning and 1 tab in the evening. 01/14/24   [name], DO  Spacer/Aero-Holding Chambers (AEROCHAMBER PLUS) Device Per patient request to help with inhaler management and effectiveness. 04/28/24   Ruthell Lauraine FALCON, NP  sucralfate  (CARAFATE ) 1 g tablet Take 1 tablet (1 g total) by mouth 4 (four) times daily. Dissolve each tablet in 15 cc water before use. 06/01/24   Kingsley, Aleeza [place] K, DO  tiZANidine  (ZANAFLEX ) 4 MG tablet Take 1 tablet (4 mg total) by mouth at bedtime as needed for muscle spasms. 01/14/24   [name], DO  triamcinolone  cream (KENALOG ) 0.1 % Apply 1 Application topically 2 (two) times daily as needed (itch). 11/12/23   [name], DO    Allergies: Ibuprofen, Isosorbide  nitrate, Oxycodone , and Penicillins    Review of Systems  Gastrointestinal:  Positive for vomiting.    Updated Vital Signs BP (!) 141/95   Pulse 61   Temp (!) 97.5 F (36.4 C)   Resp 16   Ht 5' 11 (1.803 m)   Wt 77.1 kg   SpO2 96%   BMI 23.71 kg/m   Physical Exam Vitals  and nursing note reviewed.  Constitutional:      General: He is not in acute distress.    Appearance: Normal appearance. He is ill-appearing.  HENT:     Head: Normocephalic and atraumatic.     Nose: Nose normal.     Mouth/Throat:     Mouth: Mucous membranes are dry.     Pharynx: Oropharynx is clear.  Eyes:     Extraocular Movements: Extraocular movements intact.     Conjunctiva/sclera: Conjunctivae normal.  Cardiovascular:     Rate and Rhythm: Normal rate and regular rhythm.     Heart sounds: Normal heart sounds.  Pulmonary:     Effort: Pulmonary effort is normal.     Breath sounds: Normal breath sounds.  Abdominal:     General: Abdomen is flat.     Palpations: Abdomen is soft.     Tenderness: There is abdominal tenderness (Diffuse).  Musculoskeletal:        General: Normal range of motion.     Cervical back: Normal range of motion.  Skin:    General: Skin is warm and dry.  Neurological:     General: No focal deficit present.     Mental Status: He is alert and oriented to person, place, and time.  Psychiatric:        Mood and Affect: Mood normal.        Behavior: Behavior normal.     (all labs ordered are listed, but only abnormal results are displayed) Labs Reviewed  CBC WITH DIFFERENTIAL/PLATELET - Abnormal; Notable for the following components:      Result Value   RBC 3.74 (*)    Hemoglobin 12.3 (*)    HCT 37.6 (*)    MCV 100.5 (*)    RDW 16.4 (*)    Lymphs Abs 0.6 (*)    All other components within normal limits  RESP PANEL BY RT-PCR (RSV, FLU A&B, COVID)  [id number]  COMPREHENSIVE METABOLIC PANEL WITH GFR  LIPASE, BLOOD  URINALYSIS, ROUTINE W REFLEX MICROSCOPIC    EKG: EKG Interpretation Date/Time:  Sunday July 20 2024 21:43:44 EST Ventricular Rate:  59 PR Interval:  143 QRS Duration:  97 QT Interval:  475 QTC Calculation: 471 R Axis:   84  Text Interpretation: Sinus rhythm Borderline right axis deviation Abnrm T, consider ischemia, anterolateral  lds No significant change since last tracing Confirmed by Ellouise Fine ([id number]) on 07/20/2024 9:49:45 PM  Radiology: [facility] Chest Port 1 View Result Date: 07/20/2024 EXAM: 1 VIEW(S) XRAY OF THE CHEST 07/20/2024 09:41:00 PM COMPARISON: Comparison with 06/01/2024. CLINICAL HISTORY: Nausea, vomiting, and dizziness for 2 days. FINDINGS: LINES, TUBES AND DEVICES: Port-type central venous catheter with tip over the low SVC (Superior Vena Cava) region. LUNGS AND PLEURA: Lungs are clear. Pulmonary vascularity is normal. No pleural effusion. No pneumothorax. HEART AND MEDIASTINUM: Heart size is normal. Mediastinal contours appear intact. Calcification of the aorta. BONES AND SOFT TISSUES: Postoperative changes in the right shoulder. No acute osseous abnormality. IMPRESSION: 1. No acute cardiopulmonary abnormality. Electronically signed by: Elsie Gravely MD 07/20/2024 09:54 PM EST RP Workstation: [id number]     Procedures   Medications Ordered in the ED  lactated ringers  bolus 1,000 mL (1,000 mLs Intravenous New Bag/Given 07/20/24 2158)  metoCLOPramide  (REGLAN ) injection 10 mg (10 mg Intravenous Given 07/20/24 2159)  acetaminophen  (TYLENOL ) tablet 650 mg (650 mg Oral Given 07/20/24 2206)  prochlorperazine  (COMPAZINE ) injection 10 mg (10 mg Intravenous Given 07/20/24 2258)    Clinical Course as of 07/20/24 2305  Sun Jul 20, 2024  2242 Labs within normal range, no signs of severe dehydration.  [VK]  2250 Patient reports headache and abdominal soreness resolved but still feeling nauseous. Abd soft and non-tender. He will be given additional compazine  and reassessed for PO trial.  [VK]  2304 Patient signed out to Dr. Griselda pending reassessment after compazine .  [VK]    Clinical Course User Index [VK] Ellouise Fine POUR, DO                                 Medical Decision Making This patient presents to the ED with chief complaint(s) of N/V with pertinent past medical history of lung  cancer on immunotherapy  which further complicates the presenting complaint. The complaint involves an extensive differential diagnosis and also carries with it a high risk of complications and morbidity.    The differential diagnosis includes dehydration, electrolyte abnormality, viral syndrome, gastroenteritis, gastritis, GERD, pancreatitis, hepatitis cholelithiasis, cholecystitis, SBO less likely as he is still passing gas  Additional history obtained: Additional history obtained from family Records reviewed outpatient oncology records  ED Course and Reassessment: On patient's arrival he is hemodynamic stable and cute distress.  Patient will have EKG, labs and chest x-ray performed.  He will be started on fluids and nausea control and will be closely reassessed.  Independent labs interpretation:  The following labs were independently interpreted: within normal range  Independent visualization of imaging: - I independently visualized the following imaging with scope of interpretation limited to determining acute life threatening conditions related to emergency care: CXR, which revealed no acute disease   Amount and/or Complexity of Data Reviewed Labs: ordered. Radiology: ordered.  Risk OTC drugs. Prescription drug management.       Final diagnoses:  Intractable nausea    ED Discharge Orders     None          Kingsley, Kleber Crean K, DO 07/20/24 2305  "

## 2024-07-20 NOTE — ED Triage Notes (Signed)
 Pt bib [facility] from home with complaints of n/v and shob with dizziness on exertion x2 days. Pt states that he started having a headache today and denies fever.  Given 4mg  zofran  en route.

## 2024-07-20 NOTE — ED Provider Notes (Signed)
 Charles Grimes

## 2024-07-21 ENCOUNTER — Encounter: Payer: Self-pay | Admitting: Internal Medicine

## 2024-07-21 MED ORDER — ONDANSETRON 4 MG PO TBDP: 4.0000 mg | ORAL_TABLET | Freq: Three times a day (TID) | ORAL | 0 refills | Status: AC | PRN

## 2024-07-22 ENCOUNTER — Telehealth: Payer: Self-pay | Admitting: Medical Oncology

## 2024-07-22 ENCOUNTER — Inpatient Hospital Stay: Attending: Internal Medicine

## 2024-07-22 ENCOUNTER — Other Ambulatory Visit: Payer: Self-pay | Admitting: Medical Oncology

## 2024-07-22 DIAGNOSIS — E860 Dehydration: Secondary | ICD-10-CM | POA: Insufficient documentation

## 2024-07-22 DIAGNOSIS — C3412 Malignant neoplasm of upper lobe, left bronchus or lung: Secondary | ICD-10-CM

## 2024-07-22 MED ORDER — SODIUM CHLORIDE 0.9 % IV SOLN: INTRAVENOUS | Status: DC

## 2024-07-22 MED ORDER — ONDANSETRON HCL 4 MG/2ML IJ SOLN
8.0000 mg | Freq: Once | INTRAMUSCULAR | Status: AC
  Administered 2024-07-22: 8 mg via INTRAVENOUS
  Filled 2024-07-22: qty 4

## 2024-07-22 NOTE — Telephone Encounter (Addendum)
 Patient reports vomiting three times within the past 24 hours. States he vomits after consuming food or fluids. Patient reports minimal oral intake today, consisting of a small amount of cereal. He endorses dizziness and lightheadedness since Friday night. Denies fever or pain. Patient reports he felt improvement after receiving IV fluids in the emergency department on Sunday; however, symptoms have persisted. States symptoms improve when lying down.  Imfinzi   given 2 weeks ago.  Son said pt has not been exposed to flu or covid ( he was in ED Sunday , but not tested). Pt has only been around family . He denies cough, runny nose, sore throat.   Per Sherrod , I told son to expect a call for pt to come in today for IVF.

## 2024-07-22 NOTE — Patient Instructions (Signed)
 Fluids Given Through an IV (IV Infusion Therapy): What to Expect IV infusion therapy is a treatment to deliver a fluid, called an infusion, into a vein. You may have IV infusion to get: Fluids. Medicines. Nutrition. Chemotherapy. This is medicines to stop or slow down cancer cells. Blood or blood products. Dye that is given before an MRI or a CT scan. This is called contrast dye. Tell a health care provider about: Any allergies you have. This includes allergies to anesthesia or dyes. All medicines you take. These include vitamins, herbs, eye drops, and creams. Any bleeding problems you have. Any surgeries you've had, including if you've had lymph nodes taken out of your armpit or if you have a arteriovenous fistula for dialysis. Any medical problems you have. Whether you're pregnant or may be pregnant. Whether you've used IV drugs. What are the risks? Your health care provider will talk with you about risks. These may include: Pain, bruising, or bleeding. Infection. The IV leaking or moving out of place. Damage to blood vessels or nerves. Allergic reactions to medicines or dyes. A blood clot. An air bubble in the vein, also called an air embolism. What happens before the procedure? Eat and drink only as you've been told. Ask about changing or stopping: Any medicines you take. Any vitamins, herbs, or supplements you take. What happens during the procedure?     Placing the catheter Your skin at the IV site will be washed with fluid that kills germs. This will help prevent infection. IV infusion therapy starts with a procedure to place a soft tube called a catheter into a vein. An IV tube will be attached to the catheter to let the infusion flow into your blood. Your catheter may be placed: Into a vein that is usually in the bend of the elbow, forearm, or back of the hand. This is called a peripheral IV catheter. This may need to be put into a vein each time you get an  infusion. Into a vein near your elbow. This is called a midline catheter or a peripherally inserted central catheter (PICC). These types of catheters may stay in place for weeks or months at a time so you can receive repeated infusions through it. Into a vein near your neck that leads to your heart. This is called a non-tunneled catheter. This is only used for short amounts of time because it can cause infection. Through the skin of your chest and into a large vein that leads to your heart. This is called a tunneled catheter. This may stay in your body for months or years. Into an implanted port. An implanted port is a device that is surgically inserted under the skin of the chest to provide long-term IV access. The catheter will connect the port to a large vein in the chest or upper arm. A port may be kept in place for many months or years. Each time you have an infusion, a needle will be inserted through your skin to connect the catheter to the port. Doing the infusion To start the infusion, your provider will: Attach the IV tubing to your catheter. Use a tape or a bandage to hold the IV in place against your skin. An IV pump may be used to control the flow of the IV infusion. During the infusion, your provider will check the area to make sure: There is no bleeding, swelling, or pain. Your IV infusion is flowing correctly. After the infusion, your provider will: Take off the bandage  or tape. Disconnect the tubing from the catheter. Remove the catheter, if you have a peripheral IV. Apply pressure over the IV insertion site to stop bleeding, then cover the area with a bandage. If you have an implanted port, PICC, non-tunneled, or tunneled catheter, your catheter may remain in place. This depends on how many times you will need treatment, your medical condition, and what type of catheter you have. These steps may vary. Ask what you can expect. What can I expect after the procedure? You may be  watched closely until you leave. This includes checking your pain level, blood pressure, heart rate, and breathing rate. Your provider will check to make sure there are no signs of infection. Follow these instructions at home: Take your medicines only as told. Change or take off your bandage as told by your provider. Ask what things are safe for you to do at home. Ask when you can go back to work or school. Do not take baths, swim, or use a hot tub until you're told it's OK. Ask if you can shower. Check your IV insertion site every day for signs of infection. Check for: Redness, swelling, or pain. Fluid or blood. If fluid or blood drains from your IV site, use your hands to press down firmly on the area for a minute or two. Doing this should stop the bleeding. Warmth. Pus or a bad smell. Contact a health care provider if: You have signs of infection around your IV site. You have fluid or blood coming from your IV site that does not stop after you put pressure to the site. You have a rash or blisters. You have itchy, red, swollen areas of skin called hives. Get help right away if: You have a fever or chills. You have chest pain. You have trouble breathing. This information is not intended to replace advice given to you by your health care provider. Make sure you discuss any questions you have with your health care provider. Document Revised: 12/26/2022 Document Reviewed: 12/26/2022 Elsevier Patient Education  2024 [employer].

## 2024-07-23 ENCOUNTER — Emergency Department (HOSPITAL_COMMUNITY)

## 2024-07-23 ENCOUNTER — Other Ambulatory Visit: Payer: Self-pay

## 2024-07-23 ENCOUNTER — Inpatient Hospital Stay (HOSPITAL_COMMUNITY)

## 2024-07-23 ENCOUNTER — Encounter (HOSPITAL_COMMUNITY): Payer: Self-pay | Admitting: Radiology

## 2024-07-23 ENCOUNTER — Inpatient Hospital Stay (HOSPITAL_COMMUNITY)
Admission: EM | Admit: 2024-07-23 | Discharge: 2024-07-29 | DRG: 025 | Disposition: A | Discharge status: Home Health | Attending: Neurological Surgery | Admitting: Neurological Surgery

## 2024-07-23 ENCOUNTER — Telehealth: Payer: Self-pay | Admitting: Medical Oncology

## 2024-07-23 DIAGNOSIS — Z880 Allergy status to penicillin: Secondary | ICD-10-CM

## 2024-07-23 DIAGNOSIS — D696 Thrombocytopenia, unspecified: Secondary | ICD-10-CM | POA: Diagnosis present

## 2024-07-23 DIAGNOSIS — I10 Essential (primary) hypertension: Secondary | ICD-10-CM | POA: Diagnosis present

## 2024-07-23 DIAGNOSIS — R42 Dizziness and giddiness: Secondary | ICD-10-CM

## 2024-07-23 DIAGNOSIS — Z7982 Long term (current) use of aspirin: Secondary | ICD-10-CM

## 2024-07-23 DIAGNOSIS — F17210 Nicotine dependence, cigarettes, uncomplicated: Secondary | ICD-10-CM | POA: Diagnosis present

## 2024-07-23 DIAGNOSIS — Z7951 Long term (current) use of inhaled steroids: Secondary | ICD-10-CM

## 2024-07-23 DIAGNOSIS — Z801 Family history of malignant neoplasm of trachea, bronchus and lung: Secondary | ICD-10-CM

## 2024-07-23 DIAGNOSIS — I2510 Atherosclerotic heart disease of native coronary artery without angina pectoris: Secondary | ICD-10-CM | POA: Diagnosis present

## 2024-07-23 DIAGNOSIS — Z1152 Encounter for screening for COVID-19: Secondary | ICD-10-CM | POA: Diagnosis not present

## 2024-07-23 DIAGNOSIS — K219 Gastro-esophageal reflux disease without esophagitis: Secondary | ICD-10-CM | POA: Diagnosis present

## 2024-07-23 DIAGNOSIS — Z8249 Family history of ischemic heart disease and other diseases of the circulatory system: Secondary | ICD-10-CM

## 2024-07-23 DIAGNOSIS — R0609 Other forms of dyspnea: Secondary | ICD-10-CM | POA: Diagnosis present

## 2024-07-23 DIAGNOSIS — I428 Other cardiomyopathies: Secondary | ICD-10-CM | POA: Diagnosis present

## 2024-07-23 DIAGNOSIS — Z85118 Personal history of other malignant neoplasm of bronchus and lung: Secondary | ICD-10-CM

## 2024-07-23 DIAGNOSIS — Z832 Family history of diseases of the blood and blood-forming organs and certain disorders involving the immune mechanism: Secondary | ICD-10-CM | POA: Diagnosis not present

## 2024-07-23 DIAGNOSIS — G936 Cerebral edema: Secondary | ICD-10-CM | POA: Diagnosis present

## 2024-07-23 DIAGNOSIS — R112 Nausea with vomiting, unspecified: Secondary | ICD-10-CM | POA: Diagnosis present

## 2024-07-23 DIAGNOSIS — Z9221 Personal history of antineoplastic chemotherapy: Secondary | ICD-10-CM | POA: Diagnosis not present

## 2024-07-23 DIAGNOSIS — Z886 Allergy status to analgesic agent status: Secondary | ICD-10-CM

## 2024-07-23 DIAGNOSIS — G9389 Other specified disorders of brain: Principal | ICD-10-CM

## 2024-07-23 DIAGNOSIS — F32A Depression, unspecified: Secondary | ICD-10-CM | POA: Diagnosis present

## 2024-07-23 DIAGNOSIS — Z833 Family history of diabetes mellitus: Secondary | ICD-10-CM

## 2024-07-23 DIAGNOSIS — Z825 Family history of asthma and other chronic lower respiratory diseases: Secondary | ICD-10-CM | POA: Diagnosis not present

## 2024-07-23 DIAGNOSIS — Z9889 Other specified postprocedural states: Secondary | ICD-10-CM

## 2024-07-23 DIAGNOSIS — Z923 Personal history of irradiation: Secondary | ICD-10-CM | POA: Diagnosis not present

## 2024-07-23 DIAGNOSIS — F419 Anxiety disorder, unspecified: Secondary | ICD-10-CM | POA: Diagnosis present

## 2024-07-23 DIAGNOSIS — E785 Hyperlipidemia, unspecified: Secondary | ICD-10-CM | POA: Diagnosis present

## 2024-07-23 DIAGNOSIS — Z885 Allergy status to narcotic agent status: Secondary | ICD-10-CM

## 2024-07-23 DIAGNOSIS — C3490 Malignant neoplasm of unspecified part of unspecified bronchus or lung: Secondary | ICD-10-CM | POA: Diagnosis not present

## 2024-07-23 DIAGNOSIS — Z9225 Personal history of immunosupression therapy: Secondary | ICD-10-CM

## 2024-07-23 DIAGNOSIS — J449 Chronic obstructive pulmonary disease, unspecified: Secondary | ICD-10-CM | POA: Diagnosis present

## 2024-07-23 DIAGNOSIS — Z79899 Other long term (current) drug therapy: Secondary | ICD-10-CM

## 2024-07-23 DIAGNOSIS — C801 Malignant (primary) neoplasm, unspecified: Secondary | ICD-10-CM | POA: Diagnosis not present

## 2024-07-23 DIAGNOSIS — Z888 Allergy status to other drugs, medicaments and biological substances status: Secondary | ICD-10-CM

## 2024-07-23 DIAGNOSIS — D630 Anemia in neoplastic disease: Secondary | ICD-10-CM | POA: Diagnosis present

## 2024-07-23 DIAGNOSIS — Z83719 Family history of colon polyps, unspecified: Secondary | ICD-10-CM | POA: Diagnosis not present

## 2024-07-23 DIAGNOSIS — C7931 Secondary malignant neoplasm of brain: Principal | ICD-10-CM | POA: Diagnosis present

## 2024-07-23 DIAGNOSIS — Z87891 Personal history of nicotine dependence: Secondary | ICD-10-CM | POA: Diagnosis not present

## 2024-07-23 DIAGNOSIS — R059 Cough, unspecified: Secondary | ICD-10-CM | POA: Diagnosis present

## 2024-07-23 DIAGNOSIS — G4733 Obstructive sleep apnea (adult) (pediatric): Secondary | ICD-10-CM | POA: Diagnosis present

## 2024-07-23 LAB — URINALYSIS, ROUTINE W REFLEX MICROSCOPIC
Bilirubin Urine: NEGATIVE
Glucose, UA: NEGATIVE mg/dL
Hgb urine dipstick: NEGATIVE
Ketones, ur: 20 mg/dL — AB
Leukocytes,Ua: NEGATIVE
Nitrite: NEGATIVE
Protein, ur: NEGATIVE mg/dL
Specific Gravity, Urine: >1.046 — ABNORMAL HIGH (ref 1.005–1.030)
pH: 5 (ref 5.0–8.0)

## 2024-07-23 LAB — CBC
HCT: 38.4 % — ABNORMAL LOW (ref 39.0–52.0)
Hemoglobin: 12.3 g/dL — ABNORMAL LOW (ref 13.0–17.0)
MCH: 32.6 pg (ref 26.0–34.0)
MCHC: 32 g/dL (ref 30.0–36.0)
MCV: 101.9 fL — ABNORMAL HIGH (ref 80.0–100.0)
Platelets: 152 K/uL (ref 150–400)
RBC: 3.77 MIL/uL — ABNORMAL LOW (ref 4.22–5.81)
RDW: 15.6 % — ABNORMAL HIGH (ref 11.5–15.5)
WBC: 8.4 K/uL (ref 4.0–10.5)
nRBC: 0 % (ref 0.0–0.2)

## 2024-07-23 LAB — COMPREHENSIVE METABOLIC PANEL WITH GFR
ALT: 12 U/L (ref 0–44)
AST: 24 U/L (ref 15–41)
Albumin: 4.1 g/dL (ref 3.5–5.0)
Alkaline Phosphatase: 118 U/L (ref 38–126)
Anion gap: 14 (ref 5–15)
BUN: 19 mg/dL (ref 8–23)
CO2: 23 mmol/L (ref 22–32)
Calcium: 9.5 mg/dL (ref 8.9–10.3)
Chloride: 105 mmol/L (ref 98–111)
Creatinine, Ser: 0.78 mg/dL (ref 0.61–1.24)
GFR, Estimated: >60 mL/min
Glucose, Bld: 99 mg/dL (ref 70–99)
Potassium: 4 mmol/L (ref 3.5–5.1)
Sodium: 142 mmol/L (ref 135–145)
Total Bilirubin: 0.8 mg/dL (ref 0.0–1.2)
Total Protein: 7.8 g/dL (ref 6.5–8.1)

## 2024-07-23 LAB — CBG MONITORING, ED
Glucose-Capillary: 98 mg/dL (ref 70–99)
Glucose-Capillary: 88 mg/dL (ref 70–99)

## 2024-07-23 LAB — LIPASE, BLOOD: Lipase: 12 U/L (ref 11–51)

## 2024-07-23 MED ORDER — DEXAMETHASONE SOD PHOSPHATE PF 10 MG/ML IJ SOLN
10.0000 mg | Freq: Once | INTRAMUSCULAR | Status: AC
  Administered 2024-07-23: 10 mg via INTRAVENOUS
  Filled 2024-07-23: qty 1

## 2024-07-23 MED ORDER — ONDANSETRON HCL 4 MG/2ML IJ SOLN: 4.0000 mg | Freq: Four times a day (QID) | INTRAMUSCULAR | Status: DC | PRN

## 2024-07-23 MED ORDER — SODIUM CHLORIDE 0.45 % IV SOLN: INTRAVENOUS | Status: AC

## 2024-07-23 MED ORDER — MORPHINE SULFATE (PF) 2 MG/ML IV SOLN
1.0000 mg | INTRAVENOUS | Status: DC | PRN
  Administered 2024-07-23 – 2024-07-25 (×15): 1 mg via INTRAVENOUS
  Filled 2024-07-23 (×15): qty 1

## 2024-07-23 MED ORDER — GADOBUTROL 1 MMOL/ML IV SOLN
7.0000 mL | Freq: Once | INTRAVENOUS | Status: AC | PRN
  Administered 2024-07-23: 7 mL via INTRAVENOUS

## 2024-07-23 MED ORDER — IOHEXOL 300 MG/ML  SOLN
100.0000 mL | Freq: Once | INTRAMUSCULAR | Status: AC | PRN
  Administered 2024-07-23: 100 mL via INTRAVENOUS

## 2024-07-23 MED ORDER — ONDANSETRON HCL 4 MG PO TABS: 4.0000 mg | ORAL_TABLET | Freq: Four times a day (QID) | ORAL | Status: DC | PRN

## 2024-07-23 MED ORDER — HYDRALAZINE HCL 20 MG/ML IJ SOLN
5.0000 mg | Freq: Four times a day (QID) | INTRAMUSCULAR | Status: AC | PRN
  Administered 2024-07-25: 5 mg via INTRAVENOUS
  Filled 2024-07-23: qty 1

## 2024-07-23 MED ORDER — SODIUM CHLORIDE 0.9 % IV SOLN
12.5000 mg | Freq: Once | INTRAVENOUS | Status: AC
  Administered 2024-07-23: 12.5 mg via INTRAVENOUS
  Filled 2024-07-23: qty 12.5

## 2024-07-23 MED ORDER — ACETAMINOPHEN 325 MG PO TABS: 650.0000 mg | ORAL_TABLET | Freq: Four times a day (QID) | ORAL | Status: DC | PRN

## 2024-07-23 MED ORDER — LACTATED RINGERS IV BOLUS
1000.0000 mL | Freq: Once | INTRAVENOUS | Status: AC
  Administered 2024-07-23: 1000 mL via INTRAVENOUS

## 2024-07-23 MED ORDER — DEXAMETHASONE SODIUM PHOSPHATE 4 MG/ML IJ SOLN
4.0000 mg | Freq: Four times a day (QID) | INTRAMUSCULAR | Status: DC
  Administered 2024-07-24 – 2024-07-25 (×7): 4 mg via INTRAVENOUS
  Filled 2024-07-23 (×7): qty 1

## 2024-07-23 MED ORDER — ACETAMINOPHEN 650 MG RE SUPP: 650.0000 mg | Freq: Four times a day (QID) | RECTAL | Status: DC | PRN

## 2024-07-23 NOTE — Telephone Encounter (Addendum)
 Spoke with son and he said Charles Grimes has also been having headaches , facial pain and Left eye pain since Saturday  He told me Charles Grimes is now with pt and to call her. [name] and said she is very concerned with Charles Grimes's appearance. It look like he had a stroke. Something is not right with him  I told her to call EMS and take him to ED.

## 2024-07-23 NOTE — ED Notes (Addendum)
 Pt in mri; unable to medicate at this time

## 2024-07-23 NOTE — H&P (Signed)
 " [facility] History and Physical  CARY LOTHROP FMW:[id number] DOB: December 22, 1955 DOA: 07/23/2024   PCP: [name], DO  Specialists: Dr. Sherrod is his oncologist  Chief Complaint: Headache with nausea and vomiting  HPI: Charles Grimes is a 69 y.o. male with a past medical history of stage IIIa non-small cell lung cancer which was initially diagnosed in August 2025.  He completed course of concurrent chemoradiation and then was started on immunotherapy recently.  Patient with also history of nonobstructive coronary artery disease under medical management.  Patient is accompanied by his 2 sons.  They mention that for the past several days patient has had a headache and has had multiple episodes of nausea followed by vomiting.  He denies any vision impairment.  He presented to the emergency department on 1/4 where he was treated symptomatically and he was discharged home when he felt better.  Came back to the hospital today due to recurrence of symptoms.  After being medicated he is feeling better though he still has a headache which is 7 out of 10 in intensity.  Denies any shortness of breath.  Does complain of pain in the left side of his chest where the cancer is located.  Diffuse abdominal pain is present.  No fever or chills.  No passing out episodes.  Evaluation in the emergency department raised concern for metastasis in the brain.  He was hospitalized for further management.  Home Medications: This list is not reconciled yet. Prior to Admission medications  Medication Sig Start Date End Date Taking? Authorizing Provider  arformoterol  (BROVANA ) 15 MCG/2ML NEBU Take 2 mLs (15 mcg total) by nebulization 2 (two) times daily. 05/01/24   Ruthell Lauraine FALCON, NP  budesonide  (PULMICORT ) 0.25 MG/2ML nebulizer solution Take 2 mLs (0.25 mg total) by nebulization 2 (two) times daily. 05/01/24 05/01/25  Ruthell Lauraine FALCON, NP  ipratropium (ATROVENT ) 0.02 % nebulizer solution Take 2.5 mLs (0.5  mg total) by nebulization 4 (four) times daily. 05/01/24   Ruthell Lauraine FALCON, NP  albuterol  (PROVENTIL ) (2.5 MG/3ML) 0.083% nebulizer solution Take 3 mLs (2.5 mg total) by nebulization every 6 (six) hours as needed for wheezing or shortness of breath. 04/27/24   Melvenia Motto, MD  albuterol  (VENTOLIN  HFA) 108 [id number] Base) MCG/ACT inhaler Inhale 2 puffs into the lungs every 6 (six) hours as needed for wheezing or shortness of breath. 02/25/24   Ruthell Lauraine FALCON, NP  ALPRAZolam  (XANAX ) 0.5 MG tablet Take 1 tablet (0.5 mg total) by mouth at bedtime as needed for anxiety. 10/24/23   [name], DO  alum & mag hydroxide-simeth (MAALOX MAX) 400-400-40 MG/5ML suspension Take 15 mLs by mouth every 6 (six) hours as needed for indigestion. 06/01/24   Kingsley, Victoria K, DO  aspirin  EC 81 MG tablet Take 81 mg by mouth daily.    [provider]  atorvastatin  (LIPITOR) 10 MG tablet Take 1 tablet (10 mg total) by mouth daily. 10/24/23   [name], DO  budesonide -glycopyrrolate -formoterol (BREZTRI  AEROSPHERE) 160-9-4.8 MCG/ACT AERO inhaler Inhale 2 puffs into the lungs in the morning and at bedtime. 02/25/24   Ruthell Lauraine FALCON, NP  budesonide -glycopyrrolate -formoterol (BREZTRI  AEROSPHERE) 160-9-4.8 MCG/ACT AERO inhaler Inhale 2 puffs into the lungs in the morning and at bedtime. 02/25/24   Ruthell Lauraine FALCON, NP  clotrimazole  (MYCELEX ) 10 MG troche Take 1 tablet (10 mg total) by mouth 3 (three) times daily. 02/25/24   Ruthell Lauraine FALCON, NP  colchicine  0.6 MG tablet Take 1 tablet (  0.6 mg total) by mouth daily as needed (gout flares). 02/19/24   Shelah Lamar RAMAN, MD  docusate sodium  (COLACE) 100 MG capsule Take 1 capsule (100 mg total) by mouth every 12 (twelve) hours. 04/14/24   Mannie Fairy DASEN, DO  HYDROcodone -acetaminophen  (NORCO/VICODIN) 5-325 MG tablet Take 1 tablet by mouth every 6 (six) hours as needed for moderate pain (pain score 4-6). 05/26/24   Shannon Agent, MD  lidocaine  (XYLOCAINE ) 2 % solution  Use as directed 15 mLs in the mouth or throat every 6 (six) hours as needed for mouth pain. 04/28/24   Sherrod Sherrod, MD  lidocaine  (XYLOCAINE ) 2 % solution Use as directed 15 mLs in the mouth or throat every 4 (four) hours as needed for mouth pain. 06/01/24   Kingsley, Victoria K, DO  lisinopril  (ZESTRIL ) 10 MG tablet Take 1 tablet (10 mg total) by mouth daily. 01/14/24   [name], DO  methylPREDNISolone  (MEDROL  DOSEPAK) 4 MG TBPK tablet Use as instructed 05/12/24   Sherrod Sherrod, MD  metoCLOPramide  (REGLAN ) 10 MG tablet Take 1 tablet (10 mg total) by mouth every 8 (eight) hours as needed for nausea. 04/27/24   Melvenia Motto, MD  Multiple Vitamins-Minerals (CENTRUM SILVER 50+MEN) TABS Take 1 tablet by mouth daily.    [provider]  nitroGLYCERIN  (NITROSTAT ) 0.4 MG SL tablet Place 1 tablet (0.4 mg total) under the tongue every 5 (five) minutes as needed for chest pain. 02/08/21   [name], DO  NON FORMULARY Pt uses a c-pap nightly    [provider]  omeprazole  (PRILOSEC ) 20 MG capsule Take 1 capsule (20 mg total) by mouth daily. 01/14/24   [name], DO  ondansetron  (ZOFRAN -ODT) 4 MG disintegrating tablet Take 1 tablet (4 mg total) by mouth every 8 (eight) hours as needed. 07/21/24   Griselda Norris, MD  polyethylene glycol powder (MIRALAX ) 17 GM/SCOOP powder Take 17 g by mouth daily. Dissolve 1 capful (17g) in 4-8 ounces of liquid and take by mouth daily. 04/14/24   Mannie Fairy T, DO  sertraline  (ZOLOFT ) 50 MG tablet Take 2 tabs in the morning and 1 tab in the evening. 01/14/24   [name], DO  Spacer/Aero-Holding Chambers (AEROCHAMBER PLUS) Device Per patient request to help with inhaler management and effectiveness. 04/28/24   Ruthell Lauraine FALCON, NP  sucralfate  (CARAFATE ) 1 g tablet Take 1 tablet (1 g total) by mouth 4 (four) times daily. Dissolve each tablet in 15 cc water before use. 06/01/24   Kingsley, Victoria K, DO   tiZANidine  (ZANAFLEX ) 4 MG tablet Take 1 tablet (4 mg total) by mouth at bedtime as needed for muscle spasms. 01/14/24   [name], DO  triamcinolone  cream (KENALOG ) 0.1 % Apply 1 Application topically 2 (two) times daily as needed (itch). 11/12/23   [name], DO    Allergies: Allergies[1]  Past Medical History: Past Medical History:  Diagnosis Date   Anginal pain    Anxiety    Arthritis    Coronary artery disease, non-occlusive 08/2013   Mild to moderate (40% OM1) single vessel CAD. Otherwise nonobstructed.   Depression    Dyslipidemia, goal LDL below 100    Essential hypertension    Family history of premature CAD    GERD (gastroesophageal reflux disease)    H/O skin disorder    Involving hands. Subsequently resolved.    Nonischemic cardiomyopathy (HCC) 05/2013   Non-ischemic: EF ~45% by Echo & Myoview  --> Non-obstructive CAD   Obesity (  BMI 30.0-34.9)    OSA (obstructive sleep apnea) 06/21/2017   uses CPAP   Sleep apnea     Past Surgical History:  Procedure Laterality Date   COLONOSCOPY  2015   COLONOSCOPY WITH PROPOFOL   01/02/2017   Dr.Pyrtle   IR IMAGING GUIDED PORT INSERTION  04/02/2024   LEFT HEART CATHETERIZATION WITH CORONARY ANGIOGRAM  08/26/2013   Mild to moderate disease with 40-50% stenosis and OM1. Otherwise no significant CAD --  Surgeon: Alm LELON Clay, MD;  Location: [facility] CATH LAB;  Service: Cardiovascular;;   Lower extremity arterial Dopplers  06/11/2013   No occlusive disease   LUMBAR LAMINECTOMY/DECOMPRESSION MICRODISCECTOMY Right 10/07/2014   Procedure: LUMBAR LAMINECTOMY/DECOMPRESSION MICRODISCECTOMY 1 LEVEL;  Surgeon: Arley Helling, MD;  Location: [facility] NEURO ORS;  Service: Neurosurgery;  Laterality: Right;  Right L3-L4 Microdiscectomy   NM MYOVIEW  LTD  06/03/2013   Low risk study with no ischemia. EF roughly 44% with no regional wall motion abnormalities noted   PFTs  06/16/2013   Normal Volumes &  Spirometry; Moderately reduced  DLCO   POLYPECTOMY     SHOULDER HEMI-ARTHROPLASTY Right 05/28/2015   Procedure: RIGHT SHOULDER HEMI-ARTHROPLASTY CTA HEAD AND SUBSCAP REPAIR ;  Surgeon: Marcey Her, MD;  Location: [facility] OR;  Service: Orthopedics;  Laterality: Right;   TRANSTHORACIC ECHOCARDIOGRAM  06/11/2013   Mildly reduced EF: 45-50%. Mild anterior hypokinesis with incoordinate septal motion. No evidence of pulmonary hypertension   VIDEO BRONCHOSCOPY WITH ENDOBRONCHIAL ULTRASOUND Left 02/19/2024   Procedure: BRONCHOSCOPY, WITH EBUS;  Surgeon: Shelah Lamar RAMAN, MD;  Location: [facility] ENDOSCOPY;  Service: Pulmonary;  Laterality: Left;    Social History: Lives in [place].  His sons care for him.  No current use of tobacco or alcohol.  No recreational drug use.  Family History:  Family History  Problem Relation Age of Onset   Atrial fibrillation Mother        Alive at 37   COPD Mother    Hypertension Mother    Colonic polyp Mother    Hypertension Father        Alive at 30   Lung cancer Father        Chronic smoker   Factor V Leiden deficiency Father        Also Lupus anticoagulant   Heart attack Maternal Grandmother 48   Heart failure Paternal Grandmother    Diabetes Paternal Grandmother    Heart attack Brother 47       X2   Heart attack Sister 58       Deceased   Healthy Sister        x2   Healthy Son        x2   Colon cancer Neg Hx    Esophageal cancer Neg Hx    Rectal cancer Neg Hx    Stomach cancer Neg Hx      Review of Systems -unable to do as he is somewhat lethargic and in a lot of pain and discomfort.  Physical Examination  Vitals:   07/23/24 1413 07/23/24 1826  BP: (!) 166/79 (!) 176/81  Pulse: (!) 51 (!) 53  Resp: 16 17  Temp: 97.7 F (36.5 C) 97.7 F (36.5 C)  TempSrc: Oral Oral  SpO2: 96% 96%    BP (!) 176/81   Pulse (!) 53   Temp 97.7 F (36.5 C) (Oral)   Resp 17   SpO2 96%   General appearance: alert, cooperative, appears stated age, fatigued, and no distress Head:  Normocephalic, without  obvious abnormality, atraumatic Eyes: conjunctivae/corneas clear. PERRL, EOM's intact.  Throat: lips, mucosa, and tongue normal; teeth and gums normal Neck: no adenopathy, no carotid bruit, no JVD, supple, symmetrical, trachea midline, and thyroid  not enlarged, symmetric, no tenderness/mass/nodules Resp: Diminished air entry at the bases without any wheezing rales or rhonchi. Cardio: regular rate and rhythm, S1, S2 normal, no murmur, click, rub or gallop GI: Abdomen is soft.  He does have vague tenderness diffusely without any rebound rigidity or guarding. Extremities: extremities normal, atraumatic, no cyanosis or edema Pulses: 2+ and symmetric Skin: Skin color, texture, turgor normal. No rashes or lesions Lymph nodes: Cervical, supraclavicular, and axillary nodes normal. Neurologic: Awake alert.  Somewhat slow to respond to questions.  No obvious focal neurological deficits noted.   Labs on Admission: I have personally reviewed following labs and imaging studies  CBC: Recent Labs  Lab 07/20/24 2202 07/23/24 1420  WBC 4.5 8.4  NEUTROABS 3.5  --   HGB 12.3* 12.3*  HCT 37.6* 38.4*  MCV 100.5* 101.9*  PLT 150 152   Basic Metabolic Panel: Recent Labs  Lab 07/20/24 2202 07/23/24 1420  NA 139 142  K 4.4 4.0  CL 105 105  CO2 23 23  GLUCOSE 90 99  BUN 19 19  CREATININE 0.82 0.78  CALCIUM  9.0 9.5   GFR: Estimated Creatinine Clearance: 93.8 mL/min (by C-G formula based on SCr of 0.78 mg/dL). Liver Function Tests: Recent Labs  Lab 07/20/24 2202 07/23/24 1420  AST 27 24  ALT 12 12  ALKPHOS 124 118  BILITOT 0.6 0.8  PROT 7.4 7.8  ALBUMIN 3.7 4.1   Recent Labs  Lab 07/20/24 2202 07/23/24 1420  LIPASE 12 12   CBG: Recent Labs  Lab 07/23/24 1607 07/23/24 1728  GLUCAP 98 88    Radiological Exams on Admission: CT Head Wo Contrast Result Date: 07/23/2024 CLINICAL DATA:  New onset headache EXAM: CT HEAD WITHOUT CONTRAST TECHNIQUE: Contiguous  axial images were obtained from the base of the skull through the vertex without intravenous contrast. RADIATION DOSE REDUCTION: This exam was performed according to the departmental dose-optimization program which includes automated exposure control, adjustment of the mA and/or kV according to patient size and/or use of iterative reconstruction technique. COMPARISON:  MRI 02/28/2024 FINDINGS: Brain: Interval development of a hyperdense mass within the left occipital lobe, this measures 2.8 x 1.8 by 2.9 cm. Considerable surrounding vasogenic edema. Interval heterogenous low-density in the right cerebellum with scattered areas of hyperdensity. Mass effect on fourth ventricle which is narrowed and displaced to the left. Hypodense edema probably extends to the right posterior pons. Additional focus of low density in the right frontal lobe. The ventricles are slightly enlarged compared with the prior MRI. No midline shift Vascular: No hyperdense vessels.  No unexpected calcification Skull: Normal. Negative for fracture or focal lesion. Sinuses/Orbits: No acute finding. Other: None IMPRESSION: 1. Interval development of a 2.9 cm hyperdense (?hemorrhagic) mass within the left occipital lobe with considerable surrounding vasogenic edema. Interval heterogenous low-density in the right cerebellum with scattered areas of hyperdensity, also suspicious for mass lesion but with possible petechial foci of hemorrhage or mineralization. Additional focus of low density in the right frontal lobe. Findings are suspicious for metastatic disease. Further evaluation with MRI with and without contrast is recommended. 2. Mass effect on the fourth ventricle which is narrowed and displaced to the left. Interval mild lateral and third ventricular enlargement compared with the prior MRI. Critical Value/emergent results were called by telephone at  the time of interpretation on 07/23/2024 at 5:28 pm to provider Dr. Gennaro, Who verbally  acknowledged these results. Electronically Signed   By: Luke Bun M.D.   On: 07/23/2024 17:30    My interpretation of Electrocardiogram: Sinus rhythm in the 60s.  Normal axis.  Intervals are normal.  No concerning ST or T wave changes   Problem List  Principal Problem:   Lung cancer metastatic to brain [facility]) Active Problems:   Essential hypertension   Assessment: This is a 69 year old Caucasian male with past medical history as stated earlier comes in with headache nausea vomiting.  Found to have metastatic disease and has brain which accounts for his symptoms.  He has a history of lung cancer as stated earlier.  Plan:  Brain metastases with lung primary Symptoms are due to his brain lesions.  Case was discussed with neurosurgery.  No role for operative management currently.  Steroids were recommended.  Patient has been ordered dexamethasone .  He will be given antiemetics.  MRI brain has been ordered and is pending. Patient has completed chemoradiation.  Started on immunotherapy recently.  First dose was about 2 weeks ago.  Will notify Dr. Justice of the patient's hospitalization. Consideration needs to be given to radiation treatment for his brain lesions. We will also proceed with CT scan of his chest abdomen pelvis.  No clear indication for antiepileptics at this time.  Essential hypertension Hydralazine  as needed.  Await medication reconciliation.  Nonobstructive CAD Cardiac status is stable.  Hold antiplatelets.  Medication reconciliation has not been completed yet.  DVT Prophylaxis: SCDs Code Status: CODE STATUS discussed with patient and his sons.  He is full code for now. Family Communication: Discussed with sons Disposition: To be determined Consults called: Neurosurgery called by EDP. Admission Status: Status is: Inpatient Remains inpatient appropriate because: New brain lesions requiring intravenous steroids and possible radiation treatment    Severity of  Illness: The appropriate patient status for this patient is INPATIENT. Inpatient status is judged to be reasonable and necessary in order to provide the required intensity of service to ensure the patient's safety. The patient's presenting symptoms, physical exam findings, and initial radiographic and laboratory data in the context of their chronic comorbidities is felt to place them at high risk for further clinical deterioration. Furthermore, it is not anticipated that the patient will be medically stable for discharge from the hospital within 2 midnights of admission.   * I certify that at the point of admission it is my clinical judgment that the patient will require inpatient hospital care spanning beyond 2 midnights from the point of admission due to high intensity of service, high risk for further deterioration and high frequency of surveillance required.*   Further management decisions will depend on results of further testing and patient's response to treatment.   Lakaisha Danish  [facility] Pager on [email or web address]  07/23/2024, 7:07 PM     [1]  Allergies Allergen Reactions   Ibuprofen     GI Upset   Isosorbide  Nitrate Other (See Comments)    CONTINUOUS HEADACHE    Oxycodone  Itching    Reaction was to straight oxycodone  15 mg tablets - no reaction to percocet   Penicillins Hives and Rash    Has patient had a PCN reaction causing immediate rash, facial/tongue/throat swelling, SOB or lightheadedness with hypotension: Yes Has patient had a PCN reaction causing severe rash involving mucus membranes or skin necrosis: No Has patient had a PCN reaction that required hospitalization No  Has patient had a PCN reaction occurring within the last 10 years: No If all of the above answers are NO, then may proceed with Cephalosporin use.   "

## 2024-07-23 NOTE — Telephone Encounter (Signed)
 N/V update- Today, patients son reports Charles Grimes continues to experience light-headedness and mild imbalance. Patient continues to have nausea and vomiting after eating or drinking, even small amounts.   Patient has been taking Zofran  every 8 hours PRN with limited relief. Blood pressure recorded yesterday during IVF infusion was 150/83 mmHg. Patient continues to take lisinopril  as prescribed.  Son is not with pt at this time so I called pt phone ,but no answer.

## 2024-07-23 NOTE — ED Triage Notes (Signed)
 Pt bib ems with ongoing nausea vomiting dizziness headache L eye pain since Friday. Unable to tolerate PO. Seen Sunday for the same. Given 4mg  IV zofran , 700cc LR.

## 2024-07-23 NOTE — ED Provider Triage Note (Signed)
 Emergency Medicine Provider Triage Evaluation Note  PHINEHAS GROUNDS , a 69 y.o. male  was evaluated in triage.  Pt complains of generalized weakness, nausea and vomiting, headache since Saturday since starting immunotherapy.  Seen 1/4 for similar complaints.  Review of Systems  Positive:  Negative:   Physical Exam  BP (!) 166/79 (BP Location: Right Arm)   Pulse (!) 51   Temp 97.7 F (36.5 C) (Oral)   Resp 16   SpO2 96%  Gen:   Awake, no distress   Resp:  Normal effort  MSK:   Moves extremities without difficulty  Other:  No gross deficits  Medical Decision Making  Medically screening exam initiated at 3:22 PM.  Appropriate orders placed.  Nancyann JONELLE Dollar was informed that the remainder of the evaluation will be completed by another provider, this initial triage assessment does not replace that evaluation, and the importance of remaining in the ED until their evaluation is complete.  Workup initiated   Donnajean Lynwood VEAR DEVONNA 07/23/24 [id number]

## 2024-07-23 NOTE — ED Provider Notes (Signed)
 " [place] [facility]MENT AT [facility] Provider Note   CSN: [id number] Arrival date & time: 07/23/24  1402     Patient presents with: No chief complaint on file.   Charles Grimes is a 69 y.o. male.   69 year old male presents for evaluation of nausea vomiting and headaches.  Was seen here few days ago for similar symptoms but was feeling better after fluids and nausea medicine.  States has been able unable to tolerate anything by mouth and now has a left-sided headache associated with some vision changes and dizziness.  Family at bedside helps provide history.  He states overall he feels very weak and has been unable to get out of bed or ambulate much due to dizziness.  Patient denies any numbness or tingling or focal weakness.  Denies any diarrhea, fevers, or any other symptoms or concerns.  He is currently being treated with immunotherapy for lung cancer.        Prior to Admission medications  Medication Sig Start Date End Date Taking? Authorizing Provider  arformoterol  (BROVANA ) 15 MCG/2ML NEBU Take 2 mLs (15 mcg total) by nebulization 2 (two) times daily. 05/01/24   Ruthell Lauraine FALCON, NP  budesonide  (PULMICORT ) 0.25 MG/2ML nebulizer solution Take 2 mLs (0.25 mg total) by nebulization 2 (two) times daily. 05/01/24 05/01/25  Ruthell Lauraine FALCON, NP  ipratropium (ATROVENT ) 0.02 % nebulizer solution Take 2.5 mLs (0.5 mg total) by nebulization 4 (four) times daily. 05/01/24   Ruthell Lauraine FALCON, NP  albuterol  (PROVENTIL ) (2.5 MG/3ML) 0.083% nebulizer solution Take 3 mLs (2.5 mg total) by nebulization every 6 (six) hours as needed for wheezing or shortness of breath. 04/27/24   Melvenia Motto, MD  albuterol  (VENTOLIN  HFA) 108 (90 Base) MCG/ACT inhaler Inhale 2 puffs into the lungs every 6 (six) hours as needed for wheezing or shortness of breath. 02/25/24   Ruthell Lauraine FALCON, NP  ALPRAZolam  (XANAX ) 0.5 MG tablet Take 1 tablet (0.5 mg total) by mouth at bedtime as needed for anxiety. 10/24/23    [name], DO  alum & mag hydroxide-simeth (MAALOX MAX) 400-400-40 MG/5ML suspension Take 15 mLs by mouth every 6 (six) hours as needed for indigestion. 06/01/24   Kingsley, Victoria K, DO  aspirin  EC 81 MG tablet Take 81 mg by mouth daily.    [provider]  atorvastatin  (LIPITOR) 10 MG tablet Take 1 tablet (10 mg total) by mouth daily. 10/24/23   [name], DO  budesonide -glycopyrrolate -formoterol (BREZTRI  AEROSPHERE) 160-9-4.8 MCG/ACT AERO inhaler Inhale 2 puffs into the lungs in the morning and at bedtime. 02/25/24   Ruthell Lauraine FALCON, NP  budesonide -glycopyrrolate -formoterol (BREZTRI  AEROSPHERE) 160-9-4.8 MCG/ACT AERO inhaler Inhale 2 puffs into the lungs in the morning and at bedtime. 02/25/24   Ruthell Lauraine FALCON, NP  clotrimazole  (MYCELEX ) 10 MG troche Take 1 tablet (10 mg total) by mouth 3 (three) times daily. 02/25/24   Ruthell Lauraine FALCON, NP  colchicine  0.6 MG tablet Take 1 tablet (0.6 mg total) by mouth daily as needed (gout flares). 02/19/24   Shelah Lamar RAMAN, MD  docusate sodium  (COLACE) 100 MG capsule Take 1 capsule (100 mg total) by mouth every 12 (twelve) hours. 04/14/24   Mannie Fairy DASEN, DO  HYDROcodone -acetaminophen  (NORCO/VICODIN) 5-325 MG tablet Take 1 tablet by mouth every 6 (six) hours as needed for moderate pain (pain score 4-6). 05/26/24   Shannon Agent, MD  lidocaine  (XYLOCAINE ) 2 % solution Use as directed 15 mLs in the mouth or throat  every 6 (six) hours as needed for mouth pain. 04/28/24   Sherrod Sherrod, MD  lidocaine  (XYLOCAINE ) 2 % solution Use as directed 15 mLs in the mouth or throat every 4 (four) hours as needed for mouth pain. 06/01/24   Kingsley, Victoria K, DO  lisinopril  (ZESTRIL ) 10 MG tablet Take 1 tablet (10 mg total) by mouth daily. 01/14/24   [name], DO  methylPREDNISolone  (MEDROL  DOSEPAK) 4 MG TBPK tablet Use as instructed 05/12/24   Sherrod Sherrod, MD  metoCLOPramide  (REGLAN ) 10 MG tablet Take 1 tablet (10 mg  total) by mouth every 8 (eight) hours as needed for nausea. 04/27/24   Melvenia Motto, MD  Multiple Vitamins-Minerals (CENTRUM SILVER 50+MEN) TABS Take 1 tablet by mouth daily.    [provider]  nitroGLYCERIN  (NITROSTAT ) 0.4 MG SL tablet Place 1 tablet (0.4 mg total) under the tongue every 5 (five) minutes as needed for chest pain. 02/08/21   [name], DO  NON FORMULARY Pt uses a c-pap nightly    [provider]  omeprazole  (PRILOSEC ) 20 MG capsule Take 1 capsule (20 mg total) by mouth daily. 01/14/24   [name], DO  ondansetron  (ZOFRAN -ODT) 4 MG disintegrating tablet Take 1 tablet (4 mg total) by mouth every 8 (eight) hours as needed. 07/21/24   Griselda Norris, MD  polyethylene glycol powder (MIRALAX ) 17 GM/SCOOP powder Take 17 g by mouth daily. Dissolve 1 capful (17g) in 4-8 ounces of liquid and take by mouth daily. 04/14/24   Mannie Fairy DASEN, DO  sertraline  (ZOLOFT ) 50 MG tablet Take 2 tabs in the morning and 1 tab in the evening. 01/14/24   [name], DO  Spacer/Aero-Holding Chambers (AEROCHAMBER PLUS) Device Per patient request to help with inhaler management and effectiveness. 04/28/24   Ruthell Lauraine FALCON, NP  sucralfate  (CARAFATE ) 1 g tablet Take 1 tablet (1 g total) by mouth 4 (four) times daily. Dissolve each tablet in 15 cc water before use. 06/01/24   Kingsley, Victoria K, DO  tiZANidine  (ZANAFLEX ) 4 MG tablet Take 1 tablet (4 mg total) by mouth at bedtime as needed for muscle spasms. 01/14/24   [name], DO  triamcinolone  cream (KENALOG ) 0.1 % Apply 1 Application topically 2 (two) times daily as needed (itch). 11/12/23   [name], DO    Allergies: Ibuprofen, Isosorbide  nitrate, Oxycodone , and Penicillins    Review of Systems  Constitutional:  Negative for chills and fever.  HENT:  Negative for ear pain and sore throat.   Eyes:  Negative for pain and visual disturbance.  Respiratory:  Negative for cough  and shortness of breath.   Cardiovascular:  Negative for chest pain and palpitations.  Gastrointestinal:  Positive for nausea and vomiting. Negative for abdominal pain.  Genitourinary:  Negative for dysuria and hematuria.  Musculoskeletal:  Negative for arthralgias and back pain.  Skin:  Negative for color change and rash.  Neurological:  Positive for dizziness and headaches. Negative for seizures and syncope.  All other systems reviewed and are negative.   Updated Vital Signs BP (!) 159/81   Pulse (!) 56   Temp 98.1 F (36.7 C) (Oral)   Resp 18   SpO2 94%   Physical Exam Vitals and nursing note reviewed.  Constitutional:      General: He is not in acute distress.    Appearance: Normal appearance. He is well-developed. He is ill-appearing.  HENT:     Head: Normocephalic and atraumatic.  Mouth/Throat:     Mouth: Mucous membranes are dry.  Eyes:     Conjunctiva/sclera: Conjunctivae normal.  Cardiovascular:     Rate and Rhythm: Normal rate and regular rhythm.     Heart sounds: No murmur heard. Pulmonary:     Effort: Pulmonary effort is normal. No respiratory distress.     Breath sounds: Normal breath sounds.  Abdominal:     Palpations: Abdomen is soft.     Tenderness: There is no abdominal tenderness.  Musculoskeletal:        General: No swelling.     Cervical back: Neck supple.  Skin:    General: Skin is warm and dry.     Capillary Refill: Capillary refill takes less than 2 seconds.  Neurological:     General: No focal deficit present.     Mental Status: He is alert.     Comments: Patient is globally weak, no focal weakness, finger intact  Psychiatric:        Mood and Affect: Mood normal.     (all labs ordered are listed, but only abnormal results are displayed) Labs Reviewed  CBC - Abnormal; Notable for the following components:      Result Value   RBC 3.77 (*)    Hemoglobin 12.3 (*)    HCT 38.4 (*)    MCV 101.9 (*)    RDW 15.6 (*)    All other  components within normal limits  COMPREHENSIVE METABOLIC PANEL WITH GFR  LIPASE, BLOOD  URINALYSIS, ROUTINE W REFLEX MICROSCOPIC  HIV ANTIBODY (ROUTINE TESTING W REFLEX)  COMPREHENSIVE METABOLIC PANEL WITH GFR  CBC  PROTIME-INR  CBG MONITORING, ED  CBG MONITORING, ED    EKG: None  Radiology: MR Brain W and Wo Contrast Result Date: 07/23/2024 EXAM: MRI BRAIN WITH AND WITHOUT CONTRAST 07/23/2024 07:10:00 PM TECHNIQUE: Multiplanar multisequence MRI of the head/brain was performed with and without the administration of 7 mL of gadobutrol  (GADAVIST ) 1 MMOL/ML injection. COMPARISON: Same day CT head. CLINICAL HISTORY: brain mass on ct brain mass on ct FINDINGS: BRAIN AND VENTRICLES: No acute infarct. No midline shift. No hydrocephalus. The sella is unremarkable. Normal flow voids. There is a 3.7 x 3.6 x 2.8 cm dominant heterogeneously enhancing mass in the right cerebellum with surrounding edema extending into the superior cerebellum and cerebellar vermis. This is associated with mass effect on the fourth ventricle and the right middle cerebellar peduncle. An additional 2.2 x 1.5 x 3.1 cm mass is present in the left occipital lobe with surrounding vasogenic edema and local mass effect. Edema partially extends to the left posterior aspect of the splenium and the corpus callosum. An additional 1.2 x 1.2 x 1.3 cm enhancing lesion is noted in the anterior right frontal lobe involving the right middle frontal gyrus with mild associated vasogenic edema. The left occipital and right cerebellar lesions demonstrate prominent areas of susceptibility suggestive of intralesional hemorrhage. Edema extending into the left periatrial white matter results in partial effacement of the atrium of the left lateral ventricle as well as effacement of the left occipital and left temporal horns. Mass effect in the posterior fossa results in anterior displacement of the brainstem with crowding of the prepontine and  cerebellopontine angle cisterns. There is anterior displacement and mass effect on the basilar artery without disruption of the flow void. There is mild ectopia of the right cerebellar tonsil extending below the foramen magnum by approximately 2.5 mm. Few scattered areas of T2 and FLAIR hyperintensity are present in  the periventricular and subcortical white matter suggestive of mild chronic microvascular ischemic changes. ORBITS: No acute abnormality. SINUSES: Defect in the anterior nasal septum. BONES AND SOFT TISSUES: Normal bone marrow signal and enhancement. No acute soft tissue abnormality. IMPRESSION: 1. Dominant 3.7 x 3.6 x 2.8 cm heterogeneously enhancing mass in the right cerebellum with surrounding edema, mass effect on the fourth ventricle and right middle cerebellar peduncle, intralesional hemorrhage, and posterior fossa mass effect resulting in anterior displacement of the brainstem and basilar artery. 2. 2.2 x 1.5 x 3.1 cm mass in the left occipital lobe with surrounding edema, local mass effect, partial effacement of the left lateral ventricle. 3. 1.2 x 1.2 x 1.3 cm enhancing lesion in the anterior right frontal lobe with mild associated vasogenic edema. 4. Findings concerning for intracranial metastatic disease. Electronically signed by: Donnice Mania MD MD 07/23/2024 08:52 PM EST RP Workstation: [id number]   CT CHEST ABDOMEN PELVIS W CONTRAST Result Date: 07/23/2024 EXAM: CT CHEST, ABDOMEN AND PELVIS WITH CONTRAST 07/23/2024 07:34:49 PM TECHNIQUE: CT of the chest, abdomen and pelvis was performed with the administration of 100 mL of iohexol  (OMNIPAQUE ) 300 MG/ML solution. Multiplanar reformatted images are provided for review. Automated exposure control, iterative reconstruction, and/or weight based adjustment of the mA/kV was utilized to reduce the radiation dose to as low as reasonably achievable. COMPARISON: Chest radiograph 07/20/2024, CT chest 06/19/2024, and CT abdomen and pelvis 05/06/2024.  CLINICAL HISTORY: Metastatic disease evaluation; metastatic lung cancer. Ongoing nausea, vomiting, dizziness, and headaches. FINDINGS: CHEST: MEDIASTINUM AND LYMPH NODES: Left hilar mass measures 2.3 x 2.3 cm, unchanged since prior study. This corresponds to non-treated neoplasm. No significant lymphadenopathy. The esophagus is not abnormally distended, but there is fluid in the esophagus suggesting reflux or dysmotility. Right central venous catheter with tip in the low SVC. Heart size is normal. No pericardial effusion. Calcification of the aorta. No aortic aneurysm or dissection. Central pulmonary arteries are patent without evidence of large pulmonary embolus. LUNGS AND PLEURA: Diffuse emphysematous changes in the lungs with scattered subpleural fibrosis. No focal consolidation or pulmonary edema. No pleural effusion or pneumothorax. Mild traction bronchiectasis in the bases. Scattered pulmonary nodules including representative nodule in the right upper lung, series 4 image 92, measuring 5 mm diameter. No change since previous study. ABDOMEN AND PELVIS: LIVER: Mild diffuse fatty infiltration of the liver. GALLBLADDER AND BILE DUCTS: Gallbladder and bile ducts are normal. No biliary ductal dilatation. SPLEEN: Spleen is normal. PANCREAS: Pancreas is normal. ADRENAL GLANDS: Adrenal glands are normal. KIDNEYS, URETERS AND BLADDER: Kidneys, ureters, and bladder are normal. No stones in the kidneys or ureters. No hydronephrosis. No perinephric or periureteral stranding. Urinary bladder is unremarkable. GI AND BOWEL: The stomach, small bowel, and colon are not abnormally distended. The stomach is filled with ingested material. The colon is stool-filled. No wall thickening or inflammatory stranding is noted. The appendix is normal. There is no bowel obstruction. REPRODUCTIVE ORGANS: Prostate gland is enlarged. PERITONEUM AND RETROPERITONEUM: No ascites. No free air. VASCULATURE: Aorta is normal in caliber. Calcification  of the aorta. No aneurysm. ABDOMINAL AND PELVIS LYMPH NODES: No lymphadenopathy. BONES AND SOFT TISSUES: Degenerative changes in the spine. No focal bone lesions. Compression of the L4 vertebra, unchanged since prior study. No focal soft tissue abnormality. IMPRESSION: 1. Left hilar mass measuring 2.3 x 2.3 cm, unchanged since prior study, consistent with known non-treated neoplasm. 2. No evidence of metastatic disease in the chest, abdomen, or pelvis. 3. Scattered pulmonary nodules, including a 5 mm right  upper lobe nodule, unchanged since previous study. 4. Fluid in the esophagus, which can be seen with reflux or dysmotility. Electronically signed by: Elsie Gravely MD 07/23/2024 07:50 PM EST RP Workstation: [id number]   CT Head Wo Contrast Result Date: 07/23/2024 CLINICAL DATA:  New onset headache EXAM: CT HEAD WITHOUT CONTRAST TECHNIQUE: Contiguous axial images were obtained from the base of the skull through the vertex without intravenous contrast. RADIATION DOSE REDUCTION: This exam was performed according to the departmental dose-optimization program which includes automated exposure control, adjustment of the mA and/or kV according to patient size and/or use of iterative reconstruction technique. COMPARISON:  MRI 02/28/2024 FINDINGS: Brain: Interval development of a hyperdense mass within the left occipital lobe, this measures 2.8 x 1.8 by 2.9 cm. Considerable surrounding vasogenic edema. Interval heterogenous low-density in the right cerebellum with scattered areas of hyperdensity. Mass effect on fourth ventricle which is narrowed and displaced to the left. Hypodense edema probably extends to the right posterior pons. Additional focus of low density in the right frontal lobe. The ventricles are slightly enlarged compared with the prior MRI. No midline shift Vascular: No hyperdense vessels.  No unexpected calcification Skull: Normal. Negative for fracture or focal lesion. Sinuses/Orbits: No acute finding.  Other: None IMPRESSION: 1. Interval development of a 2.9 cm hyperdense (?hemorrhagic) mass within the left occipital lobe with considerable surrounding vasogenic edema. Interval heterogenous low-density in the right cerebellum with scattered areas of hyperdensity, also suspicious for mass lesion but with possible petechial foci of hemorrhage or mineralization. Additional focus of low density in the right frontal lobe. Findings are suspicious for metastatic disease. Further evaluation with MRI with and without contrast is recommended. 2. Mass effect on the fourth ventricle which is narrowed and displaced to the left. Interval mild lateral and third ventricular enlargement compared with the prior MRI. Critical Value/emergent results were called by telephone at the time of interpretation on 07/23/2024 at 5:28 pm to provider Dr. Gennaro, Who verbally acknowledged these results. Electronically Signed   By: Luke Bun M.D.   On: 07/23/2024 17:30     Procedures   Medications Ordered in the ED  dexamethasone  (DECADRON ) injection 4 mg (has no administration in time range)  0.45 % sodium chloride  infusion ( Intravenous New Bag/Given 07/23/24 1953)  acetaminophen  (TYLENOL ) tablet 650 mg (has no administration in time range)    Or  acetaminophen  (TYLENOL ) suppository 650 mg (has no administration in time range)  morphine  (PF) 2 MG/ML injection 1 mg (1 mg Intravenous Given 07/23/24 2000)  ondansetron  (ZOFRAN ) tablet 4 mg (has no administration in time range)    Or  ondansetron  (ZOFRAN ) injection 4 mg (has no administration in time range)  hydrALAZINE  (APRESOLINE ) injection 5 mg (has no administration in time range)  lactated ringers  bolus 1,000 mL (0 mLs Intravenous Stopped 07/23/24 1828)  promethazine  (PHENERGAN ) 12.5 mg in sodium chloride  0.9 % 50 mL IVPB (0 mg Intravenous Stopped 07/23/24 1749)  dexamethasone  (DECADRON ) injection 10 mg (10 mg Intravenous Given 07/23/24 1953)  gadobutrol  (GADAVIST ) 1 MMOL/ML  injection 7 mL (7 mLs Intravenous Contrast Given 07/23/24 1857)  iohexol  (OMNIPAQUE ) 300 MG/ML solution 100 mL (100 mLs Intravenous Contrast Given 07/23/24 1922)                                    Medical Decision Making Cardiac monitor interpretation: Sinus bradycardia, no ectopy  Patient here for headache and nausea and vomiting.  Has known lung mass and has completed immunotherapy.  He is globally weak on exam and complaining of dizziness.  Vitals are fairly stable.  CT head shows new large probably hemorrhagic cerebellar mass that could be metastatic disease and possible mets in his frontal lobe as well.  MRI with and without contrast were ordered per neurosurgery recommendations.  I spoke with Meyran, for neurosurgery and she recommended Decadron  loading dose and 4 mg every 6 hours and admission.  They will plan to follow this patient.  Patient was given Dilaudid  IV fluids and Zofran  and feeling much improved.  I had a long discussion with patient and his family at bedside regarding the results and plan for admission. Discussed patients case with Dr .Verdene, hospitalist and patient will be admitted for further workup and management.  Patient family bedside are agreeable with the plan.  Problems Addressed: Brain mass: undiagnosed new problem with uncertain prognosis Dizziness: acute illness or injury Nausea and vomiting, unspecified vomiting type: acute illness or injury  Amount and/or Complexity of Data Reviewed External Data Reviewed: notes.    Details: Prior ED records reviewed patient was recently seen for nausea and vomiting with negative workup at that time Labs: ordered. Decision-making details documented in ED Course.    Details: Ordered and reviewed by me and unremarkable Radiology: ordered and independent interpretation performed. Decision-making details documented in ED Course.    Details: Ordered and interpreted by me independently of radiology Reviewed and discussed with  radiology-patient has a cerebellar mass is likely hemorrhagic with some other evidence of mets  Risk OTC drugs. Prescription drug management. Parenteral controlled substances. Drug therapy requiring intensive monitoring for toxicity. Decision regarding hospitalization. Risk Details: Meyran, neurosurgery -spoke with her regarding his patient's case and she recommend admission to hospital medicine, MRI with and without contrast and steroids  Dr. Verdene, hospitalist - will admit patient to his service for further workup and management     Final diagnoses:  Brain mass  Dizziness  Nausea and vomiting, unspecified vomiting type    ED Discharge Orders     None          Gennaro Duwaine CROME, DO 07/23/24 2246  "

## 2024-07-24 ENCOUNTER — Other Ambulatory Visit: Payer: Self-pay | Admitting: Radiation Therapy

## 2024-07-24 ENCOUNTER — Other Ambulatory Visit: Payer: Self-pay | Admitting: Neurological Surgery

## 2024-07-24 ENCOUNTER — Other Ambulatory Visit: Payer: Self-pay

## 2024-07-24 ENCOUNTER — Encounter (HOSPITAL_COMMUNITY): Payer: Self-pay | Admitting: Internal Medicine

## 2024-07-24 DIAGNOSIS — C7931 Secondary malignant neoplasm of brain: Principal | ICD-10-CM

## 2024-07-24 DIAGNOSIS — C3490 Malignant neoplasm of unspecified part of unspecified bronchus or lung: Secondary | ICD-10-CM | POA: Diagnosis not present

## 2024-07-24 LAB — COMPREHENSIVE METABOLIC PANEL WITH GFR
ALT: 14 U/L (ref 0–44)
AST: 19 U/L (ref 15–41)
Albumin: 3.9 g/dL (ref 3.5–5.0)
Alkaline Phosphatase: 113 U/L (ref 38–126)
Anion gap: 14 (ref 5–15)
BUN: 18 mg/dL (ref 8–23)
CO2: 23 mmol/L (ref 22–32)
Calcium: 9.1 mg/dL (ref 8.9–10.3)
Chloride: 102 mmol/L (ref 98–111)
Creatinine, Ser: 0.78 mg/dL (ref 0.61–1.24)
GFR, Estimated: >60 mL/min
Glucose, Bld: 112 mg/dL — ABNORMAL HIGH (ref 70–99)
Potassium: 4.2 mmol/L (ref 3.5–5.1)
Sodium: 139 mmol/L (ref 135–145)
Total Bilirubin: 0.7 mg/dL (ref 0.0–1.2)
Total Protein: 7.2 g/dL (ref 6.5–8.1)

## 2024-07-24 LAB — PROTIME-INR
INR: 1.2 (ref 0.8–1.2)
Prothrombin Time: 15.5 s — ABNORMAL HIGH (ref 11.4–15.2)

## 2024-07-24 LAB — CBC
HCT: 36.8 % — ABNORMAL LOW (ref 39.0–52.0)
Hemoglobin: 12.1 g/dL — ABNORMAL LOW (ref 13.0–17.0)
MCH: 32.9 pg (ref 26.0–34.0)
MCHC: 32.9 g/dL (ref 30.0–36.0)
MCV: 100 fL (ref 80.0–100.0)
Platelets: 148 K/uL — ABNORMAL LOW (ref 150–400)
RBC: 3.68 MIL/uL — ABNORMAL LOW (ref 4.22–5.81)
RDW: 15.2 % (ref 11.5–15.5)
WBC: 6.8 K/uL (ref 4.0–10.5)
nRBC: 0 % (ref 0.0–0.2)

## 2024-07-24 LAB — HIV ANTIBODY (ROUTINE TESTING W REFLEX): HIV Screen 4th Generation wRfx: NONREACTIVE

## 2024-07-24 NOTE — Evaluation (Signed)
 Clinical/Bedside Swallow Evaluation Patient Details  Name: Charles Grimes MRN: [id number] Date of Birth: [birth year]  Today's Date: 07/24/2024 Time: SLP Start Time (ACUTE ONLY): 1532 SLP Stop Time (ACUTE ONLY): 1556 SLP Time Calculation (min) (ACUTE ONLY): 24 min  Past Medical History:  Past Medical History:  Diagnosis Date   Anginal pain    Anxiety    Arthritis    Coronary artery disease, non-occlusive 08/2013   Mild to moderate (40% OM1) single vessel CAD. Otherwise nonobstructed.   Depression    Dyslipidemia, goal LDL below 100    Essential hypertension    Family history of premature CAD    GERD (gastroesophageal reflux disease)    H/O skin disorder    Involving hands. Subsequently resolved.    Nonischemic cardiomyopathy (HCC) 05/2013   Non-ischemic: EF ~45% by Echo & Myoview  --> Non-obstructive CAD   Obesity (BMI 30.0-34.9)    OSA (obstructive sleep apnea) 06/21/2017   uses CPAP   Sleep apnea    Past Surgical History:  Past Surgical History:  Procedure Laterality Date   COLONOSCOPY  2015   COLONOSCOPY WITH PROPOFOL   01/02/2017   Dr.Pyrtle   IR IMAGING GUIDED PORT INSERTION  04/02/2024   LEFT HEART CATHETERIZATION WITH CORONARY ANGIOGRAM  08/26/2013   Mild to moderate disease with 40-50% stenosis and OM1. Otherwise no significant CAD --  Surgeon: Alm LELON Clay, MD;  Location: [facility] CATH LAB;  Service: Cardiovascular;;   Lower extremity arterial Dopplers  06/11/2013   No occlusive disease   LUMBAR LAMINECTOMY/DECOMPRESSION MICRODISCECTOMY Right 10/07/2014   Procedure: LUMBAR LAMINECTOMY/DECOMPRESSION MICRODISCECTOMY 1 LEVEL;  Surgeon: Arley Helling, MD;  Location: [facility] NEURO ORS;  Service: Neurosurgery;  Laterality: Right;  Right L3-L4 Microdiscectomy   NM MYOVIEW  LTD  06/03/2013   Low risk study with no ischemia. EF roughly 44% with no regional wall motion abnormalities noted   PFTs  06/16/2013   Normal Volumes &  Spirometry; Moderately reduced DLCO   POLYPECTOMY     SHOULDER  HEMI-ARTHROPLASTY Right 05/28/2015   Procedure: RIGHT SHOULDER HEMI-ARTHROPLASTY CTA HEAD AND SUBSCAP REPAIR ;  Surgeon: Marcey Her, MD;  Location: [facility] OR;  Service: Orthopedics;  Laterality: Right;   TRANSTHORACIC ECHOCARDIOGRAM  06/11/2013   Mildly reduced EF: 45-50%. Mild anterior hypokinesis with incoordinate septal motion. No evidence of pulmonary hypertension   VIDEO BRONCHOSCOPY WITH ENDOBRONCHIAL ULTRASOUND Left 02/19/2024   Procedure: BRONCHOSCOPY, WITH EBUS;  Surgeon: Shelah Lamar RAMAN, MD;  Location: [facility] ENDOSCOPY;  Service: Pulmonary;  Laterality: Left;   HPI:  [name] is a 69 yo male presenting after several days of headache, N/V, and imbalance. 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  PMH includes: lung adenocarcinoma (completed chemoradiation, currently immunotherapy), GERD, CAD. He started to exhibit difficulty swallowing in Oct/Nov 2025 due to odynophagia and severe heartburn while undergoing chemoradiation.    Assessment / Plan / Recommendation  Clinical Impression  Pt's oropharyngeal swallow appears to be grossly functional with current clear liquid diet. Would advance as tolerated, per MD discretion in the setting of N/V. SLP will f/u briefly once diet is advanced, or if pt were to have surgery in the interim.   Pt's oral motor exam appears to be symmetrical, although marked by generalized weakness. It sounds like he had been experiencing difficulty swallowing  in the fall, associated with chemoradiation. This had improved, or was at least better managed by medications, to the point that he was eating regular solids/liquids up until the week PTA. Over the past week, his primary complaint has been N/V, although he does endorse trouble starting to  swallow. He reported this with a dry swallow but had no overt difficulty with swallowing when given water and jello. Suspect that may be either related to dry mucosa and/or esophageal component. Hopeful that he should be able to advance his diet as his N/V are more optimally managed. Would like to see him when he can be advanced medically to more solid diet, but would also like to reassess post-op if he were to have NSGY intervention.  SLP Visit Diagnosis: Dysphagia, unspecified (R13.10)      Swallow Evaluation Recommendations Recommendations: PO diet PO Diet Recommendation: Clear liquid diet (advance as tolerated/medically ready) Liquid Administration via: Cup;Straw Medication Administration: Whole meds with liquid Supervision: Patient able to self-feed;Intermittent supervision/cueing for swallowing strategies Swallowing strategies  : Slow rate;Small bites/sips Postural changes: Position pt fully upright for meals;Stay upright 30-60 min after meals Oral care recommendations: Oral care BID (2x/day)   Assistance Recommended at Discharge    Functional Status Assessment Patient has had a recent decline in their functional status and demonstrates the ability to make significant improvements in function in a reasonable and predictable amount of time.  Frequency and Duration min 2x/week  2 weeks       Prognosis Prognosis for improved oropharyngeal function: Good      Swallow Study   General HPI: [name] is a 69 yo male presenting after several days of headache, N/V, and imbalance. 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  PMH includes: lung adenocarcinoma (completed chemoradiation, currently immunotherapy), GERD, CAD. He started to exhibit difficulty  swallowing in Oct/Nov 2025 due to odynophagia and severe heartburn while undergoing chemo. Type of Study: Bedside Swallow Evaluation Previous Swallow Assessment: none in chart Diet Prior to this Study: Clear liquid diet;Thin liquids (Level 0) Temperature Spikes Noted: No Respiratory Status: Room air History of Recent Intubation: No Behavior/Cognition: Alert;Cooperative;Pleasant mood Oral Cavity Assessment: Within Functional Limits Oral Care Completed by SLP: No Oral Cavity - Dentition: Dentures, top;Missing dentition (has partials for bottom but they're at home/he doesn't wear them) Vision: Functional for self-feeding Self-Feeding Abilities: Able to feed self Patient Positioning: Upright in bed Baseline Vocal Quality: Normal Volitional Cough: Strong Volitional Swallow: Able to elicit    Oral/Motor/Sensory Function Overall Oral Motor/Sensory Function: Generalized oral weakness   Ice Chips Ice chips: Not tested   Thin Liquid Thin Liquid: Within functional limits Presentation: Self Fed;Straw    Nectar Thick Nectar Thick Liquid: Not tested   Honey Thick Honey Thick Liquid: Not tested   Puree Puree: Not tested   Solid     Solid: Not tested      [name], M.A. CCC-SLP Acute Rehabilitation Services Office: [phone]  Secure chat preferred  07/24/2024,4:15 PM

## 2024-07-24 NOTE — Plan of Care (Signed)
" °  Problem: Education: Goal: Knowledge of General Education information will improve Description: Including pain rating scale, medication(s)/side effects and non-pharmacologic comfort measures Outcome: Progressing   Problem: Health Behavior/Discharge Planning: Goal: Ability to manage health-related needs will improve Outcome: Progressing   Problem: Clinical Measurements: Goal: Will remain free from infection Outcome: Progressing Goal: Diagnostic test results will improve Outcome: Progressing Goal: Respiratory complications will improve Outcome: Progressing Goal: Cardiovascular complication will be avoided Outcome: Progressing   Problem: Pain Managment: Goal: General experience of comfort will improve and/or be controlled Outcome: Progressing   "

## 2024-07-24 NOTE — Progress Notes (Addendum)
 LETCHER Grimes   DOB:1956-04-03   FM#:[id number]      CLINICAL SUMMARY:  Charles Grimes is a 69 year old male patient who presented on 07/23/2024 with complaints of headache with nausea and vomiting.  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  Medical oncology following.    ASSESSMENT & PLAN:  Non-small cell lung cancer, adenocarcinoma Newly diagnosed brain mets - Initially diagnosed in August 2025 - Status post concurrent chemoradiation, received carboplatin  and paclitaxel  x 8 cycles.   --Subsequently immunotherapy with Imfinzi  every 4 weeks, first dose 07/08/2024. -- Appreciate Neurosurg evaluation, consideration for resection of cerebellar lesion.  -- Pending Radiation Oncology evaluation for brain RT.  -- Medical Oncology/Dr. Sherrod following closely and will make further recommendations.  Nausea/Vomiting Headaches  Gait disturbance -- Secondary to brain mets -- Continue anti-emetics as ordered -- Continue supportive care  Anemia Thrombocytopenia -- Mild. Secondary to recent oncologic therapy -- Hemoglobin stable 12.1 -- Platelets stable 148K -- Continue to monitor CBC with differential   Code Status Full  Subjective:  Patient seen resting flat in bed. States nausea/vomiting, headaches, with difficulty ambulating worsening for about 5 days. Reports that nausea/vomiting has eased up a lot. States that his balance is off. Patient's son at bedside. Denies acute pain or shortness of breath.   Objective:   Intake/Output Summary (Last 24 hours) at 07/24/2024 0951 Last data filed at 07/23/2024 1828 Gross per 24 hour  Intake 1013.4 ml  Output --  Net 1013.4 ml     PHYSICAL EXAMINATION: ECOG PERFORMANCE STATUS: 3 - Symptomatic, >50% confined to bed  Vitals:   07/24/24 0645 07/24/24 0830  BP: 137/69   Pulse: (!) 49   Resp: 16   Temp:  98 F (36.7 C)  SpO2: 93%    There were no vitals filed for this visit.  GENERAL: alert, no distress and  comfortable +ill-appearing SKIN: skin color, texture, turgor are normal, no rashes or significant lesions EYES: normal, conjunctiva are pink and non-injected, sclera clear OROPHARYNX: no exudate, no erythema and lips, buccal mucosa, and tongue normal  NECK: supple, thyroid  normal size, non-tender, without nodularity LYMPH: no palpable lymphadenopathy in the cervical, axillary or inguinal LUNGS: clear to auscultation and percussion with normal breathing effort HEART: regular rate & rhythm and no murmurs and no lower extremity edema ABDOMEN: abdomen soft, non-tender and normal bowel sounds MUSCULOSKELETAL: no cyanosis of digits and no clubbing  PSYCH: alert & oriented x 3 with fluent speech NEURO: no focal motor/sensory deficits   All questions were answered. The patient knows to call the clinic with any problems, questions or concerns.   I personally spent a total of 40 minutes minutes in the care of the patient today including preparing to see the patient, getting/reviewing separately obtained history, performing a medically appropriate exam/evaluation, referring and communicating with other health care professionals, documenting clinical information in the EHR, and coordinating care.    Olam PARAS Rouson, NP 07/24/2024 9:51 AM    Labs Reviewed:  Lab Results  Component Value Date   WBC 6.8 07/24/2024   HGB 12.1 (L) 07/24/2024   HCT 36.8 (L) 07/24/2024   MCV 100.0 07/24/2024   PLT 148 (L) 07/24/2024   Recent Labs    04/27/24 1941 04/28/24 0901 07/20/24 2202 07/23/24 1420 07/24/24 0512  NA 134*   < > 139 142 139  K 4.6   < > 4.4 4.0 4.2  CL 102   < > 105 105 102  CO2 21*   < >  23 23 23   GLUCOSE 100*   < > 90 99 112*  BUN 32*   < > 19 19 18   CREATININE 1.15   < > 0.82 0.78 0.78  CALCIUM  8.6*   < > 9.0 9.5 9.1  GFRNONAA >60   < > >60 >60 >60  PROT 6.8   < > 7.4 7.8 7.2  ALBUMIN 3.8   < > 3.7 4.1 3.9  AST 25   < > 27 24 19   ALT 23   < > 12 12 14   ALKPHOS 107   < > 124 118  113  BILITOT 0.5   < > 0.6 0.8 0.7  BILIDIR 0.2  --   --   --   --   IBILI 0.3  --   --   --   --    < > = values in this interval not displayed.    Studies Reviewed:   MR Brain W and Wo Contrast Result Date: 07/23/2024 EXAM: MRI BRAIN WITH AND WITHOUT CONTRAST 07/23/2024 07:10:00 PM TECHNIQUE: Multiplanar multisequence MRI of the head/brain was performed with and without the administration of 7 mL of gadobutrol  (GADAVIST ) 1 MMOL/ML injection. COMPARISON: Same day CT head. CLINICAL HISTORY: brain mass on ct brain mass on ct FINDINGS: BRAIN AND VENTRICLES: No acute infarct. No midline shift. No hydrocephalus. The sella is unremarkable. Normal flow voids. There is a 3.7 x 3.6 x 2.8 cm dominant heterogeneously enhancing mass in the right cerebellum with surrounding edema extending into the superior cerebellum and cerebellar vermis. This is associated with mass effect on the fourth ventricle and the right middle cerebellar peduncle. An additional 2.2 x 1.5 x 3.1 cm mass is present in the left occipital lobe with surrounding vasogenic edema and local mass effect. Edema partially extends to the left posterior aspect of the splenium and the corpus callosum. An additional 1.2 x 1.2 x 1.3 cm enhancing lesion is noted in the anterior right frontal lobe involving the right middle frontal gyrus with mild associated vasogenic edema. The left occipital and right cerebellar lesions demonstrate prominent areas of susceptibility suggestive of intralesional hemorrhage. Edema extending into the left periatrial white matter results in partial effacement of the atrium of the left lateral ventricle as well as effacement of the left occipital and left temporal horns. Mass effect in the posterior fossa results in anterior displacement of the brainstem with crowding of the prepontine and cerebellopontine angle cisterns. There is anterior displacement and mass effect on the basilar artery without disruption of the flow void. There  is mild ectopia of the right cerebellar tonsil extending below the foramen magnum by approximately 2.5 mm. Few scattered areas of T2 and FLAIR hyperintensity are present in the periventricular and subcortical white matter suggestive of mild chronic microvascular ischemic changes. ORBITS: No acute abnormality. SINUSES: Defect in the anterior nasal septum. BONES AND SOFT TISSUES: Normal bone marrow signal and enhancement. No acute soft tissue abnormality. IMPRESSION: 1. Dominant 3.7 x 3.6 x 2.8 cm heterogeneously enhancing mass in the right cerebellum with surrounding edema, mass effect on the fourth ventricle and right middle cerebellar peduncle, intralesional hemorrhage, and posterior fossa mass effect resulting in anterior displacement of the brainstem and basilar artery. 2. 2.2 x 1.5 x 3.1 cm mass in the left occipital lobe with surrounding edema, local mass effect, partial effacement of the left lateral ventricle. 3. 1.2 x 1.2 x 1.3 cm enhancing lesion in the anterior right frontal lobe with mild associated vasogenic edema. 4.  Findings concerning for intracranial metastatic disease. Electronically signed by: Donnice Mania MD MD 07/23/2024 08:52 PM EST RP Workstation: [id number]   CT CHEST ABDOMEN PELVIS W CONTRAST Result Date: 07/23/2024 EXAM: CT CHEST, ABDOMEN AND PELVIS WITH CONTRAST 07/23/2024 07:34:49 PM TECHNIQUE: CT of the chest, abdomen and pelvis was performed with the administration of 100 mL of iohexol  (OMNIPAQUE ) 300 MG/ML solution. Multiplanar reformatted images are provided for review. Automated exposure control, iterative reconstruction, and/or weight based adjustment of the mA/kV was utilized to reduce the radiation dose to as low as reasonably achievable. COMPARISON: Chest radiograph 07/20/2024, CT chest 06/19/2024, and CT abdomen and pelvis 05/06/2024. CLINICAL HISTORY: Metastatic disease evaluation; metastatic lung cancer. Ongoing nausea, vomiting, dizziness, and headaches. FINDINGS: CHEST:  MEDIASTINUM AND LYMPH NODES: Left hilar mass measures 2.3 x 2.3 cm, unchanged since prior study. This corresponds to non-treated neoplasm. No significant lymphadenopathy. The esophagus is not abnormally distended, but there is fluid in the esophagus suggesting reflux or dysmotility. Right central venous catheter with tip in the low SVC. Heart size is normal. No pericardial effusion. Calcification of the aorta. No aortic aneurysm or dissection. Central pulmonary arteries are patent without evidence of large pulmonary embolus. LUNGS AND PLEURA: Diffuse emphysematous changes in the lungs with scattered subpleural fibrosis. No focal consolidation or pulmonary edema. No pleural effusion or pneumothorax. Mild traction bronchiectasis in the bases. Scattered pulmonary nodules including representative nodule in the right upper lung, series 4 image 92, measuring 5 mm diameter. No change since previous study. ABDOMEN AND PELVIS: LIVER: Mild diffuse fatty infiltration of the liver. GALLBLADDER AND BILE DUCTS: Gallbladder and bile ducts are normal. No biliary ductal dilatation. SPLEEN: Spleen is normal. PANCREAS: Pancreas is normal. ADRENAL GLANDS: Adrenal glands are normal. KIDNEYS, URETERS AND BLADDER: Kidneys, ureters, and bladder are normal. No stones in the kidneys or ureters. No hydronephrosis. No perinephric or periureteral stranding. Urinary bladder is unremarkable. GI AND BOWEL: The stomach, small bowel, and colon are not abnormally distended. The stomach is filled with ingested material. The colon is stool-filled. No wall thickening or inflammatory stranding is noted. The appendix is normal. There is no bowel obstruction. REPRODUCTIVE ORGANS: Prostate gland is enlarged. PERITONEUM AND RETROPERITONEUM: No ascites. No free air. VASCULATURE: Aorta is normal in caliber. Calcification of the aorta. No aneurysm. ABDOMINAL AND PELVIS LYMPH NODES: No lymphadenopathy. BONES AND SOFT TISSUES: Degenerative changes in the spine.  No focal bone lesions. Compression of the L4 vertebra, unchanged since prior study. No focal soft tissue abnormality. IMPRESSION: 1. Left hilar mass measuring 2.3 x 2.3 cm, unchanged since prior study, consistent with known non-treated neoplasm. 2. No evidence of metastatic disease in the chest, abdomen, or pelvis. 3. Scattered pulmonary nodules, including a 5 mm right upper lobe nodule, unchanged since previous study. 4. Fluid in the esophagus, which can be seen with reflux or dysmotility. Electronically signed by: Elsie Gravely MD 07/23/2024 07:50 PM EST RP Workstation: [id number]   CT Head Wo Contrast Result Date: 07/23/2024 CLINICAL DATA:  New onset headache EXAM: CT HEAD WITHOUT CONTRAST TECHNIQUE: Contiguous axial images were obtained from the base of the skull through the vertex without intravenous contrast. RADIATION DOSE REDUCTION: This exam was performed according to the departmental dose-optimization program which includes automated exposure control, adjustment of the mA and/or kV according to patient size and/or use of iterative reconstruction technique. COMPARISON:  MRI 02/28/2024 FINDINGS: Brain: Interval development of a hyperdense mass within the left occipital lobe, this measures 2.8 x 1.8 by 2.9 cm. Considerable surrounding vasogenic  edema. Interval heterogenous low-density in the right cerebellum with scattered areas of hyperdensity. Mass effect on fourth ventricle which is narrowed and displaced to the left. Hypodense edema probably extends to the right posterior pons. Additional focus of low density in the right frontal lobe. The ventricles are slightly enlarged compared with the prior MRI. No midline shift Vascular: No hyperdense vessels.  No unexpected calcification Skull: Normal. Negative for fracture or focal lesion. Sinuses/Orbits: No acute finding. Other: None IMPRESSION: 1. Interval development of a 2.9 cm hyperdense (?hemorrhagic) mass within the left occipital lobe with considerable  surrounding vasogenic edema. Interval heterogenous low-density in the right cerebellum with scattered areas of hyperdensity, also suspicious for mass lesion but with possible petechial foci of hemorrhage or mineralization. Additional focus of low density in the right frontal lobe. Findings are suspicious for metastatic disease. Further evaluation with MRI with and without contrast is recommended. 2. Mass effect on the fourth ventricle which is narrowed and displaced to the left. Interval mild lateral and third ventricular enlargement compared with the prior MRI. Critical Value/emergent results were called by telephone at the time of interpretation on 07/23/2024 at 5:28 pm to provider Dr. Gennaro, Who verbally acknowledged these results. Electronically Signed   By: Luke Bun M.D.   On: 07/23/2024 17:30   DG Chest Port 1 View Result Date: 07/20/2024 EXAM: 1 VIEW(S) XRAY OF THE CHEST 07/20/2024 09:41:00 PM COMPARISON: Comparison with 06/01/2024. CLINICAL HISTORY: Nausea, vomiting, and dizziness for 2 days. FINDINGS: LINES, TUBES AND DEVICES: Port-type central venous catheter with tip over the low SVC (Superior Vena Cava) region. LUNGS AND PLEURA: Lungs are clear. Pulmonary vascularity is normal. No pleural effusion. No pneumothorax. HEART AND MEDIASTINUM: Heart size is normal. Mediastinal contours appear intact. Calcification of the aorta. BONES AND SOFT TISSUES: Postoperative changes in the right shoulder. No acute osseous abnormality. IMPRESSION: 1. No acute cardiopulmonary abnormality. Electronically signed by: Elsie Gravely MD 07/20/2024 09:54 PM EST RP Workstation: [id number]   ADDENDUM: Hematology/oncology Attending:  The patient is seen and examined today.  I agree with the above note.  I saw him in the hospital with several family members at the bedside.  He is a very pleasant 69 years old white male with history of stage IIIa non-small cell lung cancer diagnosed in August 2025 status post a course  of concurrent chemoradiation with weekly carboplatin  and paclitaxel .  He started consolidation treatment with immunotherapy with durvalumab  every 4 weeks on July 08, 2024.  He has been complaining of intractable nausea and vomiting over the last few days.  He was given several antiemetics with no improvement.  We recommended for him to have MRI of the brain to rule out brain metastasis.  Earlier yesterday he developed more headache and nausea and vomiting as well as gait disturbance and he presented to the emergency department for evaluation and for the MRI of the brain.  Unfortunately MRI of the brain showed a dominant 3.7 x 3.6 x 2.8 cm heterogeneously enhancing mass in the right cerebellum with surrounding edema and mass effect on the fourth ventricle and right middle cerebellar peduncle in addition to intralesional hemorrhage and posterior fossa mass effect resulting in anterior displacement of the brainstem and basilar artery.  There was also a 2.2 x 1.5 x 3.1 cm mass in the left occipital lobe with surrounding edema as well as local mass effect and partial effacement of the left lateral ventricle.  In addition there was 1.2 x 1.2 x 1.3 cm enhancing lesion in  the anterior right frontal lobe with mild associated vasogenic edema.  The finding were concerning for intracranial metastatic disease from his lung cancer.  The patient was admitted and started on Decadron  and he is feeling a little bit better.  He was seen by neurosurgery Dr. Alm Molt who is planning for craniotomy and resection of the right cerebellar mass.  He also has a consultation with radiation oncology and their plan is for the [facility] to the other brain lesions after the surgical resection of the cerebellar mass. I had a lengthy discussion with the patient and his family today about his condition.  The family were requesting transfer of the patient to [facility] because they were under the impression that [place]  radiation oncology does not have the ability for [facility] treatment.  They were also concerned about lack of explanation of his brain metastasis and the need for the craniotomy and resection of the cerebellar lesion.  I had a long discussion with the patient and his family and I showed them the images of the MRI of the brain and why it is important to consider surgical resection of the cerebellar lesion.  I also explained to them that our radiation oncology department has the ability to treat brain lesion with SRS. The patient had repeat CT scan of the chest, abdomen and pelvis that showed no concerning findings for disease progression systemically. He will continue on Decadron  for now until improvement of his neurological symptoms and this will be tapered by radiation oncology. I will arrange for the patient a follow-up appointment with me the cancer center after his surgical resection and brain radiation.  I will discuss with him systemic treatment options again. Thank you for taking good care of Mr. Tunney.  Please call if you have any questions. Disclaimer: This note was dictated with voice recognition software. Similar sounding words can inadvertently be transcribed and may be missed upon review.  Sherrod MARLA Sherrod, MD

## 2024-07-24 NOTE — Progress Notes (Signed)
 SLP Cancellation Note  Patient Details Name: Charles Grimes MRN: [id number] DOB: 09/13/1955   Cancelled treatment:       Reason Eval/Treat Not Completed: Fatigue/lethargy limiting ability to participate. Per RN, pt very sleepy as he had just received pain meds. Also about to transfer to the floor. Son present and says that he has been doing well with liquid diet, but that he hasn't had solids in a while. Will f/u for eval as able.    [name], M.A. CCC-SLP Acute Rehabilitation Services Office: [phone]  Secure chat preferred  07/24/2024, 10:55 AM

## 2024-07-24 NOTE — Consult Note (Signed)
 Reason for Consult: Metastatic brain lesions  Referring Physician: EDP  Charles Grimes is an 69 y.o. male.   HPI:  69 year old gentleman diagnosed with stage IIIa adenocarcinoma of the lung in August (5 the brain clean at that time )who is finished radiation therapy and chemotherapy and has started immunotherapy, presents with about a 5-day history of holocephalic headaches.  He feels poorly.  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  He has had some trouble with balance and with walking for the last few days.  He feels like his quality of life over the last 3 to 5 days has been bad.  Past Medical History:  Diagnosis Date   Anginal pain    Anxiety    Arthritis    Coronary artery disease, non-occlusive 08/2013   Mild to moderate (40% OM1) single vessel CAD. Otherwise nonobstructed.   Depression    Dyslipidemia, goal LDL below 100    Essential hypertension    Family history of premature CAD    GERD (gastroesophageal reflux disease)    H/O skin disorder    Involving hands. Subsequently resolved.    Nonischemic cardiomyopathy (HCC) 05/2013   Non-ischemic: EF ~45% by Echo & Myoview  --> Non-obstructive CAD   Obesity (BMI 30.0-34.9)    OSA (obstructive sleep apnea) 06/21/2017   uses CPAP   Sleep apnea     Past Surgical History:  Procedure Laterality Date   COLONOSCOPY  2015   COLONOSCOPY WITH PROPOFOL   01/02/2017   Dr.Pyrtle   IR IMAGING GUIDED PORT INSERTION  04/02/2024   LEFT HEART CATHETERIZATION WITH CORONARY ANGIOGRAM  08/26/2013   Mild to moderate disease with 40-50% stenosis and OM1. Otherwise no significant CAD --  Surgeon: Alm LELON Clay, MD;  Location: [facility] CATH LAB;  Service: Cardiovascular;;   Lower extremity arterial Dopplers  06/11/2013   No occlusive disease   LUMBAR LAMINECTOMY/DECOMPRESSION MICRODISCECTOMY Right 10/07/2014   Procedure: LUMBAR LAMINECTOMY/DECOMPRESSION MICRODISCECTOMY 1 LEVEL;  Surgeon: Arley Helling, MD;   Location: [facility] NEURO ORS;  Service: Neurosurgery;  Laterality: Right;  Right L3-L4 Microdiscectomy   NM MYOVIEW  LTD  06/03/2013   Low risk study with no ischemia. EF roughly 44% with no regional wall motion abnormalities noted   PFTs  06/16/2013   Normal Volumes &  Spirometry; Moderately reduced DLCO   POLYPECTOMY     SHOULDER HEMI-ARTHROPLASTY Right 05/28/2015   Procedure: RIGHT SHOULDER HEMI-ARTHROPLASTY CTA HEAD AND SUBSCAP REPAIR ;  Surgeon: Marcey Her, MD;  [facility];  Service: Orthopedics;  Laterality: Right;   TRANSTHORACIC ECHOCARDIOGRAM  06/11/2013   Mildly reduced EF: 45-50%. Mild anterior hypokinesis with incoordinate septal motion. No evidence of pulmonary hypertension   VIDEO BRONCHOSCOPY WITH ENDOBRONCHIAL ULTRASOUND Left 02/19/2024   Procedure: BRONCHOSCOPY, WITH EBUS;  Surgeon: Shelah Lamar RAMAN, MD;  Location: [facility] ENDOSCOPY;  Service: Pulmonary;  Laterality: Left;    Allergies[1]  Social History   Tobacco Use   Smoking status: Former    Current packs/day: 1.00    Average packs/day: 1 pack/day for 42.0 years (42.0 ttl pk-yrs)    Types: Cigarettes   Smokeless tobacco: Never   Tobacco comments:    3 cigarettes a day 02/12/2024 KRD    Quit x1 month ago 02/12/2024  Substance Use Topics   Alcohol use: Not Currently    Comment: rarely    Family History  Problem Relation Age of Onset   Atrial fibrillation Mother  Alive at 37   COPD Mother    Hypertension Mother    Colonic polyp Mother    Hypertension Father        Alive at 21   Lung cancer Father        Chronic smoker   Factor V Leiden deficiency Father        Also Lupus anticoagulant   Heart attack Maternal Grandmother 48   Heart failure Paternal Grandmother    Diabetes Paternal Grandmother    Heart attack Brother 103       X2   Heart attack Sister 4       Deceased   Healthy Sister        x2   Healthy Son        x2   Colon cancer Neg Hx    Esophageal cancer Neg Hx    Rectal cancer Neg Hx     Stomach cancer Neg Hx      Review of Systems  Positive ROS: As above  All other systems have been reviewed and were otherwise negative with the exception of those mentioned in the HPI and as above.  Objective: Vital signs in last 24 hours: Temp:  [97.7 F (36.5 C)-98.1 F (36.7 C)] 98 F (36.7 C) (01/08 0830) Pulse Rate:  [49-66] 49 (01/08 0645) Resp:  [16-18] 16 (01/08 0645) BP: (113-176)/(50-82) 137/69 (01/08 0645) SpO2:  [92 %-96 %] 93 % (01/08 0645)  General Appearance: Alert, cooperative, no distress, appears stated age Head: Normocephalic, without obvious abnormality, atraumatic Eyes: PERRL, conjunctiva/corneas clear, EOM's intact     Throat: benign Neck: Supple, symmetrical, trachea midline Lungs: respirations unlabored Heart: Regular rate and rhythm Abdomen: Soft, non-tender Extremities: Extremities normal, atraumatic, no cyanosis or edema Pulses: 2+ and symmetric all extremities Skin: Skin color, texture, turgor normal, no rashes or lesions  NEUROLOGIC:   Mental status: A&O x4, no aphasia, good attention span, Memory and fund of knowledge appear to be appropriate Motor Exam - grossly normal, normal tone and bulk Sensory Exam - grossly normal Reflexes: symmetric, no pathologic reflexes, No Hoffman's, No clonus Coordination - grossly normal to in bed exam Gait -not tested Balance -not tested Cranial Nerves: I: smell Not tested  II: visual acuity  OS: na  OD: na  II: visual fields Full to confrontation  II: pupils Equal, round, reactive to light  III,VII: ptosis None  III,IV,VI: extraocular muscles  Full ROM  V: mastication Normal  V: facial light touch sensation  Normal  V,VII: corneal reflex  Present  VII: facial muscle function - upper  Normal  VII: facial muscle function - lower Normal  VIII: hearing Not tested  IX: soft palate elevation  Normal  IX,X: gag reflex Present  XI: trapezius strength  5/5  XI: sternocleidomastoid strength 5/5  XI: neck  flexion strength  5/5  XII: tongue strength  Normal    Data Review Lab Results  Component Value Date   WBC 6.8 07/24/2024   HGB 12.1 (L) 07/24/2024   HCT 36.8 (L) 07/24/2024   MCV 100.0 07/24/2024   PLT 148 (L) 07/24/2024   Lab Results  Component Value Date   NA 139 07/24/2024   K 4.2 07/24/2024   CL 102 07/24/2024   CO2 23 07/24/2024   BUN 18 07/24/2024   CREATININE 0.78 07/24/2024   GLUCOSE 112 (H) 07/24/2024   Lab Results  Component Value Date   INR 1.2 07/24/2024    Radiology: MR Brain W and Wo Contrast  Result Date: 07/23/2024 EXAM: MRI BRAIN WITH AND WITHOUT CONTRAST 07/23/2024 07:10:00 PM TECHNIQUE: Multiplanar multisequence MRI of the head/brain was performed with and without the administration of 7 mL of gadobutrol  (GADAVIST ) 1 MMOL/ML injection. COMPARISON: Same day CT head. CLINICAL HISTORY: brain mass on ct brain mass on ct FINDINGS: BRAIN AND VENTRICLES: No acute infarct. No midline shift. No hydrocephalus. The sella is unremarkable. Normal flow voids. There is a 3.7 x 3.6 x 2.8 cm dominant heterogeneously enhancing mass in the right cerebellum with surrounding edema extending into the superior cerebellum and cerebellar vermis. This is associated with mass effect on the fourth ventricle and the right middle cerebellar peduncle. An additional 2.2 x 1.5 x 3.1 cm mass is present in the left occipital lobe with surrounding vasogenic edema and local mass effect. Edema partially extends to the left posterior aspect of the splenium and the corpus callosum. An additional 1.2 x 1.2 x 1.3 cm enhancing lesion is noted in the anterior right frontal lobe involving the right middle frontal gyrus with mild associated vasogenic edema. The left occipital and right cerebellar lesions demonstrate prominent areas of susceptibility suggestive of intralesional hemorrhage. Edema extending into the left periatrial white matter results in partial effacement of the atrium of the left lateral ventricle  as well as effacement of the left occipital and left temporal horns. Mass effect in the posterior fossa results in anterior displacement of the brainstem with crowding of the prepontine and cerebellopontine angle cisterns. There is anterior displacement and mass effect on the basilar artery without disruption of the flow void. There is mild ectopia of the right cerebellar tonsil extending below the foramen magnum by approximately 2.5 mm. Few scattered areas of T2 and FLAIR hyperintensity are present in the periventricular and subcortical white matter suggestive of mild chronic microvascular ischemic changes. ORBITS: No acute abnormality. SINUSES: Defect in the anterior nasal septum. BONES AND SOFT TISSUES: Normal bone marrow signal and enhancement. No acute soft tissue abnormality. IMPRESSION: 1. Dominant 3.7 x 3.6 x 2.8 cm heterogeneously enhancing mass in the right cerebellum with surrounding edema, mass effect on the fourth ventricle and right middle cerebellar peduncle, intralesional hemorrhage, and posterior fossa mass effect resulting in anterior displacement of the brainstem and basilar artery. 2. 2.2 x 1.5 x 3.1 cm mass in the left occipital lobe with surrounding edema, local mass effect, partial effacement of the left lateral ventricle. 3. 1.2 x 1.2 x 1.3 cm enhancing lesion in the anterior right frontal lobe with mild associated vasogenic edema. 4. Findings concerning for intracranial metastatic disease. Electronically signed by: Donnice Mania MD MD 07/23/2024 08:52 PM EST RP Workstation: [id number]   CT CHEST ABDOMEN PELVIS W CONTRAST Result Date: 07/23/2024 EXAM: CT CHEST, ABDOMEN AND PELVIS WITH CONTRAST 07/23/2024 07:34:49 PM TECHNIQUE: CT of the chest, abdomen and pelvis was performed with the administration of 100 mL of iohexol  (OMNIPAQUE ) 300 MG/ML solution. Multiplanar reformatted images are provided for review. Automated exposure control, iterative reconstruction, and/or weight based adjustment  of the mA/kV was utilized to reduce the radiation dose to as low as reasonably achievable. COMPARISON: Chest radiograph 07/20/2024, CT chest 06/19/2024, and CT abdomen and pelvis 05/06/2024. CLINICAL HISTORY: Metastatic disease evaluation; metastatic lung cancer. Ongoing nausea, vomiting, dizziness, and headaches. FINDINGS: CHEST: MEDIASTINUM AND LYMPH NODES: Left hilar mass measures 2.3 x 2.3 cm, unchanged since prior study. This corresponds to non-treated neoplasm. No significant lymphadenopathy. The esophagus is not abnormally distended, but there is fluid in the esophagus suggesting reflux or dysmotility. Right  central venous catheter with tip in the low SVC. Heart size is normal. No pericardial effusion. Calcification of the aorta. No aortic aneurysm or dissection. Central pulmonary arteries are patent without evidence of large pulmonary embolus. LUNGS AND PLEURA: Diffuse emphysematous changes in the lungs with scattered subpleural fibrosis. No focal consolidation or pulmonary edema. No pleural effusion or pneumothorax. Mild traction bronchiectasis in the bases. Scattered pulmonary nodules including representative nodule in the right upper lung, series 4 image 92, measuring 5 mm diameter. No change since previous study. ABDOMEN AND PELVIS: LIVER: Mild diffuse fatty infiltration of the liver. GALLBLADDER AND BILE DUCTS: Gallbladder and bile ducts are normal. No biliary ductal dilatation. SPLEEN: Spleen is normal. PANCREAS: Pancreas is normal. ADRENAL GLANDS: Adrenal glands are normal. KIDNEYS, URETERS AND BLADDER: Kidneys, ureters, and bladder are normal. No stones in the kidneys or ureters. No hydronephrosis. No perinephric or periureteral stranding. Urinary bladder is unremarkable. GI AND BOWEL: The stomach, small bowel, and colon are not abnormally distended. The stomach is filled with ingested material. The colon is stool-filled. No wall thickening or inflammatory stranding is noted. The appendix is normal.  There is no bowel obstruction. REPRODUCTIVE ORGANS: Prostate gland is enlarged. PERITONEUM AND RETROPERITONEUM: No ascites. No free air. VASCULATURE: Aorta is normal in caliber. Calcification of the aorta. No aneurysm. ABDOMINAL AND PELVIS LYMPH NODES: No lymphadenopathy. BONES AND SOFT TISSUES: Degenerative changes in the spine. No focal bone lesions. Compression of the L4 vertebra, unchanged since prior study. No focal soft tissue abnormality. IMPRESSION: 1. Left hilar mass measuring 2.3 x 2.3 cm, unchanged since prior study, consistent with known non-treated neoplasm. 2. No evidence of metastatic disease in the chest, abdomen, or pelvis. 3. Scattered pulmonary nodules, including a 5 mm right upper lobe nodule, unchanged since previous study. 4. Fluid in the esophagus, which can be seen with reflux or dysmotility. Electronically signed by: Elsie Gravely MD 07/23/2024 07:50 PM EST RP Workstation: [id number]   CT Head Wo Contrast Result Date: 07/23/2024 CLINICAL DATA:  New onset headache EXAM: CT HEAD WITHOUT CONTRAST TECHNIQUE: Contiguous axial images were obtained from the base of the skull through the vertex without intravenous contrast. RADIATION DOSE REDUCTION: This exam was performed according to the departmental dose-optimization program which includes automated exposure control, adjustment of the mA and/or kV according to patient size and/or use of iterative reconstruction technique. COMPARISON:  MRI 02/28/2024 FINDINGS: Brain: Interval development of a hyperdense mass within the left occipital lobe, this measures 2.8 x 1.8 by 2.9 cm. Considerable surrounding vasogenic edema. Interval heterogenous low-density in the right cerebellum with scattered areas of hyperdensity. Mass effect on fourth ventricle which is narrowed and displaced to the left. Hypodense edema probably extends to the right posterior pons. Additional focus of low density in the right frontal lobe. The ventricles are slightly enlarged  compared with the prior MRI. No midline shift Vascular: No hyperdense vessels.  No unexpected calcification Skull: Normal. Negative for fracture or focal lesion. Sinuses/Orbits: No acute finding. Other: None IMPRESSION: 1. Interval development of a 2.9 cm hyperdense (?hemorrhagic) mass within the left occipital lobe with considerable surrounding vasogenic edema. Interval heterogenous low-density in the right cerebellum with scattered areas of hyperdensity, also suspicious for mass lesion but with possible petechial foci of hemorrhage or mineralization. Additional focus of low density in the right frontal lobe. Findings are suspicious for metastatic disease. Further evaluation with MRI with and without contrast is recommended. 2. Mass effect on the fourth ventricle which is narrowed and displaced to the  left. Interval mild lateral and third ventricular enlargement compared with the prior MRI. Critical Value/emergent results were called by telephone at the time of interpretation on 07/23/2024 at 5:28 pm to provider Dr. Gennaro, Who verbally acknowledged these results. Electronically Signed   By: Luke Bun M.D.   On: 07/23/2024 17:30     Assessment/Plan: Estimated body mass index is 23.07 kg/m as calculated from the following:   Height as of 07/20/24: 5' 11 (1.803 m).   Weight as of 07/22/24: 75 kg.  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  The cerebellar lesion is quite large and has mass effect on the fourth ventricle and I suspect he may develop hydrocephalus without treatment.  I have had a long discussion with the patient and his son regarding prognosis now that he has metastatic lesions to the brain.  He understands that without any treatment he may live for a few weeks.  With palliative radiation and chemotherapy I suspect he may get a few weeks to a few short months.  With aggressive surgery followed by  adjuvant therapy he may get 3 to 12 months.  Long-term survival was probably in the range of 5% to my understanding.  Obviously, radiation oncology and medical oncology should weigh in on this prognostic information.  I think he needs a very frank discussion from oncology regarding his prognosis and his treatment options.  This is so they can make informed decisions regarding his life goals.  Now I think he is leaning toward aggressive surgical intervention and I would recommend a right suboccipital craniotomy or craniectomy for resection of the cerebellar lesion.  I would then recommend radiation whether it be SRS or whole brain radiation to the other lesions.  Talked about typical risks and outcomes and recovery times.  Probably should consider surgery in the near term if all are agreeable after meeting with radiation oncology and medical oncology.  Certainly, if radiation oncology feels they can shrink the lesion safely and avoid surgery that is fine with me.  Continue Decadron  for now.  We will follow and for now look for a spot in the operative schedule for him in the next few days assuming that will be the plan.   Alm GORMAN Molt 07/24/2024 8:31 AM        [1]  Allergies Allergen Reactions   Ibuprofen     GI Upset   Isosorbide  Nitrate Other (See Comments)    CONTINUOUS HEADACHE    Oxycodone  Itching    Reaction was to straight oxycodone  15 mg tablets - no reaction to percocet   Penicillins Hives and Rash    Has patient had a PCN reaction causing immediate rash, facial/tongue/throat swelling, SOB or lightheadedness with hypotension: Yes Has patient had a PCN reaction causing severe rash involving mucus membranes or skin necrosis: No Has patient had a PCN reaction that required hospitalization No Has patient had a PCN reaction occurring within the last 10 years: No If all of the above answers are NO, then may proceed with Cephalosporin use.

## 2024-07-24 NOTE — Progress Notes (Signed)
 " PROGRESS NOTE    Charles Grimes  FMW:[id number] DOB: 24-Jan-1956 DOA: 07/23/2024 PCP: [name], DO  Subjective: Patient reports having headaches, but somewhat better than before.  Has not complained of nausea today, no vomiting, but his appetite continues to remain poor  Hospital Course: 69 year old male with PMH of lung adenocarcinoma (completed chemoradiation, currently immunotherapy), CAD, who presented to the ED with headache, nausea/vomiting and imbalance.  CT head showed new mass, MRI was done that showed mass in the right cerebellum with surrounding edema, mass effect on the fourth ventricle, and a mass in the left occipital lobe with surrounding edema, and lesion in the anterior right frontal lobe, findings concerning for intracranial metastatic disease.  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  Currently on hydralazine  as needed, and normotensive at this time.  Resume lisinopril  as appropriate  CAD - hold aspirin  for now  - will resume statin   Resumed PRN albuterol       DVT prophylaxis: SCDs Start: 07/23/24 1859    Code Status: Full Code Family Communication: Updated at bedside Disposition Plan: TBD Reason for continuing need for hospitalization: Further management of brain mets  Objective: Vitals:   07/24/24 0645 07/24/24 0830 07/24/24 1015 07/24/24 1100  BP: 137/69  114/64 (!) 111/59  Pulse: (!) 49  (!) 53 (!) 48  Resp: 16  16 16   Temp:  98 F (36.7 C) (!) 97.5 F (36.4 C) 97.8 F (36.6 C)  TempSrc:   Oral Oral  SpO2: 93%  97% 94%     Intake/Output Summary (Last 24 hours) at 07/24/2024 1454 Last data filed at 07/23/2024 1828 Gross per 24 hour  Intake 1013.4 ml  Output --  Net 1013.4 ml   There were no vitals filed for this visit.  Examination:  Physical Exam Vitals and nursing note reviewed.  Constitutional:      General: He is not in acute distress. Cardiovascular:     Rate and Rhythm: Normal rate.  Pulmonary:     Effort: No respiratory distress.  Abdominal:     General: There is no distension.  Musculoskeletal:     Right lower leg: No edema.     Left lower leg: No edema.     Data Reviewed: I have personally reviewed following labs and imaging studies  CBC: Recent Labs  Lab 07/20/24 2202 07/23/24 1420 07/24/24 0512  WBC 4.5 8.4 6.8  NEUTROABS 3.5  --   --   HGB 12.3* 12.3* 12.1*  HCT 37.6* 38.4* 36.8*  MCV 100.5* 101.9* 100.0  PLT 150 152 148*   Basic Metabolic Panel: Recent Labs  Lab 07/20/24 2202 07/23/24 1420 07/24/24 0512  NA 139 142 139  K 4.4 4.0 4.2  CL 105 105 102  CO2 23 23 23   GLUCOSE 90 99 112*  BUN 19 19 18   CREATININE 0.82 0.78 0.78  CALCIUM  9.0 9.5 9.1   GFR: Estimated Creatinine Clearance: 93.8 mL/min (by C-G formula based on SCr of 0.78 mg/dL). Liver Function Tests: Recent Labs  Lab 07/20/24 2202 07/23/24 1420 07/24/24 0512  AST 27 24 19   ALT 12 12 14   ALKPHOS 124 118 113  BILITOT  0.6 0.8 0.7  PROT 7.4 7.8 7.2  ALBUMIN 3.7 4.1 3.9   Recent Labs  Lab 07/20/24 2202 07/23/24 1420  LIPASE 12 12   No results for input(s): AMMONIA in the last 168 hours. Coagulation Profile: Recent Labs  Lab 07/24/24 0512  INR 1.2   Cardiac Enzymes: No results for input(s): CKTOTAL, CKMB, CKMBINDEX, TROPONINI in the last 168 hours. ProBNP, BNP (last 5 results) Recent Labs    04/27/24 1941 05/06/24 1611  PROBNP 55.6 92.2   HbA1C: No results for input(s): HGBA1C in the last 72 hours. CBG: Recent Labs  Lab 07/23/24 1607 07/23/24 1728  GLUCAP  98 88   Lipid Profile: No results for input(s): CHOL, HDL, LDLCALC, TRIG, CHOLHDL, LDLDIRECT in the last 72 hours. Thyroid  Function Tests: No results for input(s): TSH, T4TOTAL, FREET4, T3FREE, THYROIDAB in the last 72 hours. Anemia Panel: No results for input(s): VITAMINB12, FOLATE, FERRITIN, TIBC, IRON, RETICCTPCT in the last 72 hours. Sepsis Labs: No results for input(s): PROCALCITON, LATICACIDVEN in the last 168 hours.  Recent Results (from the past 240 hours)  Resp panel by RT-PCR (RSV, Flu A&B, Covid) Anterior Nasal Swab     Status: None   Collection Time: 07/20/24 10:02 PM   Specimen: Anterior Nasal Swab  Result Value Ref Range Status   SARS Coronavirus 2 by RT PCR NEGATIVE NEGATIVE Final    Comment: (NOTE) SARS-CoV-2 target nucleic acids are NOT DETECTED.  The SARS-CoV-2 RNA is generally detectable in upper respiratory specimens during the acute phase of infection. The lowest concentration of SARS-CoV-2 viral copies this assay can detect is 138 copies/mL. A negative result does not preclude SARS-Cov-2 infection and should not be used as the sole basis for treatment or other patient management decisions. A negative result may occur with  improper specimen collection/handling, submission of specimen other than nasopharyngeal swab, presence of viral mutation(s) within the areas targeted by this assay, and inadequate number of viral copies(<138 copies/mL). A negative result must be combined with clinical observations, patient history, and epidemiological information. The expected result is Negative.  Fact Sheet for Patients:  bloggercourse.com  Fact Sheet for Healthcare Providers:  seriousbroker.it  This test is no t yet approved or cleared by the [place]  FDA and  has been authorized for detection and/or diagnosis of SARS-CoV-2 by FDA under an Emergency Use Authorization (EUA).  This EUA will remain  in effect (meaning this test can be used) for the duration of the COVID-19 declaration under Section 564(b)(1) of the Act, 21 U.S.C.section 360bbb-3(b)(1), unless the authorization is terminated  or revoked sooner.       Influenza A by PCR NEGATIVE NEGATIVE Final   Influenza B by PCR NEGATIVE NEGATIVE Final    Comment: (NOTE) The Xpert Xpress SARS-CoV-2/FLU/RSV plus assay is intended as an aid in the diagnosis of influenza from Nasopharyngeal swab specimens and should not be used as a sole basis for treatment. Nasal washings and aspirates are unacceptable for Xpert Xpress SARS-CoV-2/FLU/RSV testing.  Fact Sheet for Patients: bloggercourse.com  Fact Sheet for Healthcare Providers: seriousbroker.it  This test is not yet approved or cleared by the [place]  FDA and has been authorized for detection and/or diagnosis of SARS-CoV-2 by FDA under an Emergency Use Authorization (EUA). This EUA will remain in effect (meaning this test can be used) for the duration of the COVID-19 declaration under Section 564(b)(1) of the Act, 21 U.S.C. section 360bbb-3(b)(1), unless the authorization is terminated or revoked.     Resp Syncytial Virus  by PCR NEGATIVE NEGATIVE Final    Comment: (NOTE) Fact Sheet for Patients: bloggercourse.com  Fact Sheet for Healthcare Providers: seriousbroker.it  This test is not yet approved or cleared by the [place]  FDA and has been authorized for detection and/or diagnosis of SARS-CoV-2 by FDA under an Emergency Use Authorization (EUA). This EUA will remain in effect (meaning this test can be used) for the duration of the COVID-19 declaration under Section 564(b)(1) of the Act, 21 U.S.C. section 360bbb-3(b)(1), unless the authorization is terminated or revoked.  Performed at [facility], [address], [place], [place] [address]      Radiology Studies: MR Brain W and Wo Contrast Result Date: 07/23/2024 EXAM: MRI BRAIN WITH AND WITHOUT CONTRAST 07/23/2024 07:10:00 PM TECHNIQUE: Multiplanar multisequence MRI of the head/brain was performed with and without the administration of 7 mL of gadobutrol  (GADAVIST ) 1 MMOL/ML injection. COMPARISON: Same day CT head. CLINICAL HISTORY: brain mass on ct brain mass on ct FINDINGS: BRAIN AND VENTRICLES: No acute infarct. No midline shift. No hydrocephalus. The sella is unremarkable. Normal flow voids. There is a 3.7 x 3.6 x 2.8 cm dominant heterogeneously enhancing mass in the right cerebellum with surrounding edema extending into the superior cerebellum and cerebellar vermis. This is associated with mass effect on the fourth ventricle and the right middle cerebellar peduncle. An additional 2.2 x 1.5 x 3.1 cm mass is present in the left occipital lobe with surrounding vasogenic edema and local mass effect. Edema partially extends to the left posterior aspect of the splenium and the corpus callosum. An additional 1.2 x 1.2 x 1.3 cm enhancing lesion is noted in the anterior right frontal lobe involving the right middle frontal gyrus with mild associated vasogenic edema. The left occipital and right cerebellar lesions demonstrate prominent areas of susceptibility suggestive of intralesional hemorrhage. Edema extending into the left periatrial white matter results in partial effacement of the atrium of the left lateral ventricle as well as effacement of the left occipital and left temporal horns. Mass effect in the posterior fossa results in anterior displacement of the brainstem with crowding of the prepontine and cerebellopontine angle cisterns. There is anterior displacement and mass effect on the basilar artery without disruption of the flow void. There is mild ectopia of the right cerebellar tonsil extending below the foramen magnum by approximately 2.5 mm. Few scattered  areas of T2 and FLAIR hyperintensity are present in the periventricular and subcortical white matter suggestive of mild chronic microvascular ischemic changes. ORBITS: No acute abnormality. SINUSES: Defect in the anterior nasal septum. BONES AND SOFT TISSUES: Normal bone marrow signal and enhancement. No acute soft tissue abnormality. IMPRESSION: 1. Dominant 3.7 x 3.6 x 2.8 cm heterogeneously enhancing mass in the right cerebellum with surrounding edema, mass effect on the fourth ventricle and right middle cerebellar peduncle, intralesional hemorrhage, and posterior fossa mass effect resulting in anterior displacement of the brainstem and basilar artery. 2. 2.2 x 1.5 x 3.1 cm mass in the left occipital lobe with surrounding edema, local mass effect, partial effacement of the left lateral ventricle. 3. 1.2 x 1.2 x 1.3 cm enhancing lesion in the anterior right frontal lobe with mild associated vasogenic edema. 4. Findings concerning for intracranial metastatic disease. Electronically signed by: Donnice Mania MD MD 07/23/2024 08:52 PM EST RP Workstation: [id number]   CT CHEST ABDOMEN PELVIS W CONTRAST Result Date: 07/23/2024 EXAM: CT CHEST, ABDOMEN AND PELVIS WITH CONTRAST 07/23/2024 07:34:49 PM TECHNIQUE: CT of the chest, abdomen and pelvis  was performed with the administration of 100 mL of iohexol  (OMNIPAQUE ) 300 MG/ML solution. Multiplanar reformatted images are provided for review. Automated exposure control, iterative reconstruction, and/or weight based adjustment of the mA/kV was utilized to reduce the radiation dose to as low as reasonably achievable. COMPARISON: Chest radiograph 07/20/2024, CT chest 06/19/2024, and CT abdomen and pelvis 05/06/2024. CLINICAL HISTORY: Metastatic disease evaluation; metastatic lung cancer. Ongoing nausea, vomiting, dizziness, and headaches. FINDINGS: CHEST: MEDIASTINUM AND LYMPH NODES: Left hilar mass measures 2.3 x 2.3 cm, unchanged since prior study. This corresponds to  non-treated neoplasm. No significant lymphadenopathy. The esophagus is not abnormally distended, but there is fluid in the esophagus suggesting reflux or dysmotility. Right central venous catheter with tip in the low SVC. Heart size is normal. No pericardial effusion. Calcification of the aorta. No aortic aneurysm or dissection. Central pulmonary arteries are patent without evidence of large pulmonary embolus. LUNGS AND PLEURA: Diffuse emphysematous changes in the lungs with scattered subpleural fibrosis. No focal consolidation or pulmonary edema. No pleural effusion or pneumothorax. Mild traction bronchiectasis in the bases. Scattered pulmonary nodules including representative nodule in the right upper lung, series 4 image 92, measuring 5 mm diameter. No change since previous study. ABDOMEN AND PELVIS: LIVER: Mild diffuse fatty infiltration of the liver. GALLBLADDER AND BILE DUCTS: Gallbladder and bile ducts are normal. No biliary ductal dilatation. SPLEEN: Spleen is normal. PANCREAS: Pancreas is normal. ADRENAL GLANDS: Adrenal glands are normal. KIDNEYS, URETERS AND BLADDER: Kidneys, ureters, and bladder are normal. No stones in the kidneys or ureters. No hydronephrosis. No perinephric or periureteral stranding. Urinary bladder is unremarkable. GI AND BOWEL: The stomach, small bowel, and colon are not abnormally distended. The stomach is filled with ingested material. The colon is stool-filled. No wall thickening or inflammatory stranding is noted. The appendix is normal. There is no bowel obstruction. REPRODUCTIVE ORGANS: Prostate gland is enlarged. PERITONEUM AND RETROPERITONEUM: No ascites. No free air. VASCULATURE: Aorta is normal in caliber. Calcification of the aorta. No aneurysm. ABDOMINAL AND PELVIS LYMPH NODES: No lymphadenopathy. BONES AND SOFT TISSUES: Degenerative changes in the spine. No focal bone lesions. Compression of the L4 vertebra, unchanged since prior study. No focal soft tissue abnormality.  IMPRESSION: 1. Left hilar mass measuring 2.3 x 2.3 cm, unchanged since prior study, consistent with known non-treated neoplasm. 2. No evidence of metastatic disease in the chest, abdomen, or pelvis. 3. Scattered pulmonary nodules, including a 5 mm right upper lobe nodule, unchanged since previous study. 4. Fluid in the esophagus, which can be seen with reflux or dysmotility. Electronically signed by: Elsie Gravely MD 07/23/2024 07:50 PM EST RP Workstation: [id number]   CT Head Wo Contrast Result Date: 07/23/2024 CLINICAL DATA:  New onset headache EXAM: CT HEAD WITHOUT CONTRAST TECHNIQUE: Contiguous axial images were obtained from the base of the skull through the vertex without intravenous contrast. RADIATION DOSE REDUCTION: This exam was performed according to the departmental dose-optimization program which includes automated exposure control, adjustment of the mA and/or kV according to patient size and/or use of iterative reconstruction technique. COMPARISON:  MRI 02/28/2024 FINDINGS: Brain: Interval development of a hyperdense mass within the left occipital lobe, this measures 2.8 x 1.8 by 2.9 cm. Considerable surrounding vasogenic edema. Interval heterogenous low-density in the right cerebellum with scattered areas of hyperdensity. Mass effect on fourth ventricle which is narrowed and displaced to the left. Hypodense edema probably extends to the right posterior pons. Additional focus of low density in the right frontal lobe. The ventricles are slightly enlarged  compared with the prior MRI. No midline shift Vascular: No hyperdense vessels.  No unexpected calcification Skull: Normal. Negative for fracture or focal lesion. Sinuses/Orbits: No acute finding. Other: None IMPRESSION: 1. Interval development of a 2.9 cm hyperdense (?hemorrhagic) mass within the left occipital lobe with considerable surrounding vasogenic edema. Interval heterogenous low-density in the right cerebellum with scattered areas of  hyperdensity, also suspicious for mass lesion but with possible petechial foci of hemorrhage or mineralization. Additional focus of low density in the right frontal lobe. Findings are suspicious for metastatic disease. Further evaluation with MRI with and without contrast is recommended. 2. Mass effect on the fourth ventricle which is narrowed and displaced to the left. Interval mild lateral and third ventricular enlargement compared with the prior MRI. Critical Value/emergent results were called by telephone at the time of interpretation on 07/23/2024 at 5:28 pm to provider Dr. Gennaro, Who verbally acknowledged these results. Electronically Signed   By: Luke Bun M.D.   On: 07/23/2024 17:30    Scheduled Meds:  dexamethasone  (DECADRON ) injection  4 mg Intravenous Q6H   Continuous Infusions:  sodium chloride  100 mL/hr at 07/24/24 0615     LOS: 1 day   Time spent: 40 minutes  Casimer Dare, MD  [facility]  07/24/2024, 2:54 PM   "

## 2024-07-24 NOTE — Consult Note (Signed)
 " Radiation Oncology         [phone] ________________________________  Name: Charles Grimes        MRN: [id number]  Date of Service: 07/24/24        DOB: 1956/02/18  RR:Tzwiopwh, Charles Mt, DO  No ref. provider found       DIAGNOSIS: The primary encounter diagnosis was Brain mass. Diagnoses of Dizziness and Nausea and vomiting, unspecified vomiting type were also pertinent to this visit.   HISTORY OF PRESENT ILLNESS: Charles Grimes is a 69 y.o. male seen at the request of Dr. Caleen with the [facility]. Charles Grimes is known to us  with a history of lung cancer. He was evaluated in the lung cancer screening clinic and underwent a CT scan on 01/21/2024 which showed a 5.5 cm left suprahilar mass obliterating the apical posterior segmental bronchus of the left upper lobe, this obscured evaluation of the nodal station, and he underwent bronchoscopy on 02/19/2024. Brushings and fine-needle aspirate biopsy of the left upper lobe mass were consistent with malignant cells, favoring poorly differentiated adenocarcinoma, and fine-needle aspirate of the 4R lymph node is negative for malignancy. A PET scan on 02/18/2024 was read on 02/25/2024 and confirmed a 4.2 cm left upper lobe suprahilar mass with an SUV of 18.9, mild activity throughout the esophagus which favored to be inflammatory, and a subcarinal lymph nodes measuring 10 mm with mild SUV activity of 3.9 with mediastinal blood pool SUV not noted. No evidence of metastatic disease was appreciated. An MRI of the brain on 02/28/2024 was also performed but has not been read. This case was discussed in multidisciplinary thoracic oncology conference, discussion was to consider further testing of the subcarinal lymph node station. He was not a candidate for surgical resection, and clinically it was determined he had node involvement based on his imaging results from PET. He proceeded with chemoradiation to the left lung and regional nodes which he  completed on 05/29/24.   He had a recent CT chest on 06/19/24 hat showed improvement in his cancer and a new 4 mm subpleural nodule that was indeterminate. He began Imfinizi immunotherapy for consolidation on 07/08/24. He developed progressive and persistent nausea and vomiting since that time and presented to the ED and a CT of the head without contrast showed a mass in the left occipital lobe and mass effect on the 4th ventricle. An MRI with and without contrast yesterday showed a total of 3 lesions, a 3.7 cm mass in the right cerebellum with mass effect on the 4th ventricle and right middle cerebellar peduncle with intralesional hemorrahage and posterior fossa mass effect with anterior displacement of the brainstem and basilar artery. A 3.1 cm mass in the left occipital lobe with surrounding edema and partial effacement of the left lateral ventricle, and a 1.3 cm lesion in the anterior right frontal lobe was also noted. He has been started on Dexamethasone  IV. No new sites of disease are suspected on restaging CT CAP yesterday, and Dr. Joshua has recommended surgical debulking of the right cerebellar lesion and this is scheduled for tomorrow. We've been asked to make recommendations on his new brain disease.     PREVIOUS RADIATION THERAPY: Yes   04/14/24-05/29/24: Plan Name: Lung_L Site: Lung, Left Technique: 3D Mode: Photon Dose Per Fraction: 2 Gy Prescribed Dose (Delivered / Prescribed): 60 Gy / 60 Gy Prescribed Fxs (Delivered / Prescribed): 30 / 30   Plan Name: Lung_L_Bst Site: Lung, Left Technique: 3D Mode: Photon  Dose Per Fraction: 2 Gy Prescribed Dose (Delivered / Prescribed): 6 Gy / 6 Gy Prescribed Fxs (Delivered / Prescribed): 3 / 3   PAST MEDICAL HISTORY:  Past Medical History:  Diagnosis Date   Anginal pain    Anxiety    Arthritis    Coronary artery disease, non-occlusive 08/2013   Mild to moderate (40% OM1) single vessel CAD. Otherwise nonobstructed.   Depression     Dyslipidemia, goal LDL below 100    Essential hypertension    Family history of premature CAD    GERD (gastroesophageal reflux disease)    H/O skin disorder    Involving hands. Subsequently resolved.    Nonischemic cardiomyopathy (HCC) 05/2013   Non-ischemic: EF ~45% by Echo & Myoview  --> Non-obstructive CAD   Obesity (BMI 30.0-34.9)    OSA (obstructive sleep apnea) 06/21/2017   uses CPAP   Sleep apnea        PAST SURGICAL HISTORY: Past Surgical History:  Procedure Laterality Date   COLONOSCOPY  2015   COLONOSCOPY WITH PROPOFOL   01/02/2017   Dr.Pyrtle   IR IMAGING GUIDED PORT INSERTION  04/02/2024   LEFT HEART CATHETERIZATION WITH CORONARY ANGIOGRAM  08/26/2013   Mild to moderate disease with 40-50% stenosis and OM1. Otherwise no significant CAD --  Surgeon: Alm LELON Clay, MD;  Location: [facility] CATH LAB;  Service: Cardiovascular;;   Lower extremity arterial Dopplers  06/11/2013   No occlusive disease   LUMBAR LAMINECTOMY/DECOMPRESSION MICRODISCECTOMY Right 10/07/2014   Procedure: LUMBAR LAMINECTOMY/DECOMPRESSION MICRODISCECTOMY 1 LEVEL;  Surgeon: Arley Helling, MD;  Location: [facility] NEURO ORS;  Service: Neurosurgery;  Laterality: Right;  Right L3-L4 Microdiscectomy   NM MYOVIEW  LTD  06/03/2013   Low risk study with no ischemia. EF roughly 44% with no regional wall motion abnormalities noted   PFTs  06/16/2013   Normal Volumes &  Spirometry; Moderately reduced DLCO   POLYPECTOMY     SHOULDER HEMI-ARTHROPLASTY Right 05/28/2015   Procedure: RIGHT SHOULDER HEMI-ARTHROPLASTY CTA HEAD AND SUBSCAP REPAIR ;  Surgeon: Marcey Her, MD;  Location: [facility] OR;  Service: Orthopedics;  Laterality: Right;   TRANSTHORACIC ECHOCARDIOGRAM  06/11/2013   Mildly reduced EF: 45-50%. Mild anterior hypokinesis with incoordinate septal motion. No evidence of pulmonary hypertension   VIDEO BRONCHOSCOPY WITH ENDOBRONCHIAL ULTRASOUND Left 02/19/2024   Procedure: BRONCHOSCOPY, WITH EBUS;  Surgeon: Shelah Lamar RAMAN, MD;   Location: [facility] ENDOSCOPY;  Service: Pulmonary;  Laterality: Left;     FAMILY HISTORY:  Family History  Problem Relation Age of Onset   Atrial fibrillation Mother        Alive at 29   COPD Mother    Hypertension Mother    Colonic polyp Mother    Hypertension Father        Alive at 27   Lung cancer Father        Chronic smoker   Factor V Leiden deficiency Father        Also Lupus anticoagulant   Heart attack Maternal Grandmother 48   Heart failure Paternal Grandmother    Diabetes Paternal Grandmother    Heart attack Brother 39       X2   Heart attack Sister 18       Deceased   Healthy Sister        x2   Healthy Son        x2   Colon cancer Neg Hx    Esophageal cancer Neg Hx    Rectal cancer Neg Hx    Stomach  cancer Neg Hx      SOCIAL HISTORY:  reports that he has quit smoking. His smoking use included cigarettes. He has a 42 pack-year smoking history. He has never used smokeless tobacco. He reports that he does not currently use alcohol. He reports that he does not use drugs. The patient is widowed. 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 He has adult sons who typically accompany him during treatment.  ALLERGIES: Ibuprofen, Isosorbide  nitrate, Oxycodone , and Penicillins   MEDICATIONS:  Current Facility-Administered Medications  Medication Dose Route Frequency Provider Last Rate Last Admin   0.45 % sodium chloride  infusion   Intravenous Continuous Krishnan, Gokul, MD 100 mL/hr at 07/24/24 0615 New Bag at 07/24/24 0615   acetaminophen  (TYLENOL ) tablet 650 mg  650 mg Oral Q6H PRN Krishnan, Gokul, MD       Or   acetaminophen  (TYLENOL ) suppository 650 mg  650 mg Rectal Q6H PRN Krishnan, Gokul, MD       dexamethasone  (DECADRON ) injection 4 mg  4 mg Intravenous Q6H Krishnan, Gokul, MD   4 mg at 07/24/24 1231   hydrALAZINE  (APRESOLINE ) injection 5 mg  5 mg Intravenous Q6H PRN Krishnan, Gokul, MD       morphine  (PF) 2 MG/ML injection 1 mg  1 mg Intravenous Q2H  PRN Krishnan, Gokul, MD   1 mg at 07/24/24 1021   ondansetron  (ZOFRAN ) tablet 4 mg  4 mg Oral Q6H PRN Krishnan, Gokul, MD       Or   ondansetron  (ZOFRAN ) injection 4 mg  4 mg Intravenous Q6H PRN Verdene Purchase, MD         REVIEW OF SYSTEMS: On review of systems, I was unable to reach the patient but spoke with his son Hanzel Pizzo who is known to our team as well. He states his father has been feeling slightly better since receiving steroids. His vision had been slightly altered but his main symptoms were nausea and vomiting. No other complaints are verbalized. He does state however they have been discussing his case with other friends who have connections at other facilities and asks about Gamma Knife style therapy.     PHYSICAL EXAM:  Unable to assess due to encounter type.    ECOG = 2  0 - Asymptomatic (Fully active, able to carry on all predisease activities without restriction)  1 - Symptomatic but completely ambulatory (Restricted in physically strenuous activity but ambulatory and able to carry out work of a light or sedentary nature. For example, light housework, office work)  2 - Symptomatic, <50% in bed during the day (Ambulatory and capable of all self care but unable to carry out any work activities. Up and about more than 50% of waking hours)  3 - Symptomatic, >50% in bed, but not bedbound (Capable of only limited self-care, confined to bed or chair 50% or more of waking hours)  4 - Bedbound (Completely disabled. Cannot carry on any self-care. Totally confined to bed or chair)  5 - Death   Raylene MM, Creech RH, Tormey DC, et al. [phone]). Toxicity and response criteria of the [facility]. Am. DOROTHA Bridges. Oncol. 5 (6): 649-55    LABORATORY DATA:  Lab Results  Component Value Date   WBC 6.8 07/24/2024   HGB 12.1 (L) 07/24/2024   HCT 36.8 (L) 07/24/2024   MCV 100.0 07/24/2024   PLT 148 (L) 07/24/2024   Lab Results  Component Value Date   NA 139  07/24/2024  K 4.2 07/24/2024   CL 102 07/24/2024   CO2 23 07/24/2024   Lab Results  Component Value Date   ALT 14 07/24/2024   AST 19 07/24/2024   ALKPHOS 113 07/24/2024   BILITOT 0.7 07/24/2024      RADIOGRAPHY: MR Brain W and Wo Contrast Result Date: 07/23/2024 EXAM: MRI BRAIN WITH AND WITHOUT CONTRAST 07/23/2024 07:10:00 PM TECHNIQUE: Multiplanar multisequence MRI of the head/brain was performed with and without the administration of 7 mL of gadobutrol  (GADAVIST ) 1 MMOL/ML injection. COMPARISON: Same day CT head. CLINICAL HISTORY: brain mass on ct brain mass on ct FINDINGS: BRAIN AND VENTRICLES: No acute infarct. No midline shift. No hydrocephalus. The sella is unremarkable. Normal flow voids. There is a 3.7 x 3.6 x 2.8 cm dominant heterogeneously enhancing mass in the right cerebellum with surrounding edema extending into the superior cerebellum and cerebellar vermis. This is associated with mass effect on the fourth ventricle and the right middle cerebellar peduncle. An additional 2.2 x 1.5 x 3.1 cm mass is present in the left occipital lobe with surrounding vasogenic edema and local mass effect. Edema partially extends to the left posterior aspect of the splenium and the corpus callosum. An additional 1.2 x 1.2 x 1.3 cm enhancing lesion is noted in the anterior right frontal lobe involving the right middle frontal gyrus with mild associated vasogenic edema. The left occipital and right cerebellar lesions demonstrate prominent areas of susceptibility suggestive of intralesional hemorrhage. Edema extending into the left periatrial white matter results in partial effacement of the atrium of the left lateral ventricle as well as effacement of the left occipital and left temporal horns. Mass effect in the posterior fossa results in anterior displacement of the brainstem with crowding of the prepontine and cerebellopontine angle cisterns. There is anterior displacement and mass effect on the basilar  artery without disruption of the flow void. There is mild ectopia of the right cerebellar tonsil extending below the foramen magnum by approximately 2.5 mm. Few scattered areas of T2 and FLAIR hyperintensity are present in the periventricular and subcortical white matter suggestive of mild chronic microvascular ischemic changes. ORBITS: No acute abnormality. SINUSES: Defect in the anterior nasal septum. BONES AND SOFT TISSUES: Normal bone marrow signal and enhancement. No acute soft tissue abnormality. IMPRESSION: 1. Dominant 3.7 x 3.6 x 2.8 cm heterogeneously enhancing mass in the right cerebellum with surrounding edema, mass effect on the fourth ventricle and right middle cerebellar peduncle, intralesional hemorrhage, and posterior fossa mass effect resulting in anterior displacement of the brainstem and basilar artery. 2. 2.2 x 1.5 x 3.1 cm mass in the left occipital lobe with surrounding edema, local mass effect, partial effacement of the left lateral ventricle. 3. 1.2 x 1.2 x 1.3 cm enhancing lesion in the anterior right frontal lobe with mild associated vasogenic edema. 4. Findings concerning for intracranial metastatic disease. Electronically signed by: Donnice Mania MD MD 07/23/2024 08:52 PM EST RP Workstation: [id number]   CT CHEST ABDOMEN PELVIS W CONTRAST Result Date: 07/23/2024 EXAM: CT CHEST, ABDOMEN AND PELVIS WITH CONTRAST 07/23/2024 07:34:49 PM TECHNIQUE: CT of the chest, abdomen and pelvis was performed with the administration of 100 mL of iohexol  (OMNIPAQUE ) 300 MG/ML solution. Multiplanar reformatted images are provided for review. Automated exposure control, iterative reconstruction, and/or weight based adjustment of the mA/kV was utilized to reduce the radiation dose to as low as reasonably achievable. COMPARISON: Chest radiograph 07/20/2024, CT chest 06/19/2024, and CT abdomen and pelvis 05/06/2024. CLINICAL HISTORY: Metastatic disease evaluation; metastatic  lung cancer. Ongoing nausea,  vomiting, dizziness, and headaches. FINDINGS: CHEST: MEDIASTINUM AND LYMPH NODES: Left hilar mass measures 2.3 x 2.3 cm, unchanged since prior study. This corresponds to non-treated neoplasm. No significant lymphadenopathy. The esophagus is not abnormally distended, but there is fluid in the esophagus suggesting reflux or dysmotility. Right central venous catheter with tip in the low SVC. Heart size is normal. No pericardial effusion. Calcification of the aorta. No aortic aneurysm or dissection. Central pulmonary arteries are patent without evidence of large pulmonary embolus. LUNGS AND PLEURA: Diffuse emphysematous changes in the lungs with scattered subpleural fibrosis. No focal consolidation or pulmonary edema. No pleural effusion or pneumothorax. Mild traction bronchiectasis in the bases. Scattered pulmonary nodules including representative nodule in the right upper lung, series 4 image 92, measuring 5 mm diameter. No change since previous study. ABDOMEN AND PELVIS: LIVER: Mild diffuse fatty infiltration of the liver. GALLBLADDER AND BILE DUCTS: Gallbladder and bile ducts are normal. No biliary ductal dilatation. SPLEEN: Spleen is normal. PANCREAS: Pancreas is normal. ADRENAL GLANDS: Adrenal glands are normal. KIDNEYS, URETERS AND BLADDER: Kidneys, ureters, and bladder are normal. No stones in the kidneys or ureters. No hydronephrosis. No perinephric or periureteral stranding. Urinary bladder is unremarkable. GI AND BOWEL: The stomach, small bowel, and colon are not abnormally distended. The stomach is filled with ingested material. The colon is stool-filled. No wall thickening or inflammatory stranding is noted. The appendix is normal. There is no bowel obstruction. REPRODUCTIVE ORGANS: Prostate gland is enlarged. PERITONEUM AND RETROPERITONEUM: No ascites. No free air. VASCULATURE: Aorta is normal in caliber. Calcification of the aorta. No aneurysm. ABDOMINAL AND PELVIS LYMPH NODES: No lymphadenopathy. BONES  AND SOFT TISSUES: Degenerative changes in the spine. No focal bone lesions. Compression of the L4 vertebra, unchanged since prior study. No focal soft tissue abnormality. IMPRESSION: 1. Left hilar mass measuring 2.3 x 2.3 cm, unchanged since prior study, consistent with known non-treated neoplasm. 2. No evidence of metastatic disease in the chest, abdomen, or pelvis. 3. Scattered pulmonary nodules, including a 5 mm right upper lobe nodule, unchanged since previous study. 4. Fluid in the esophagus, which can be seen with reflux or dysmotility. Electronically signed by: Elsie Gravely MD 07/23/2024 07:50 PM EST RP Workstation: [id number]   CT Head Wo Contrast Result Date: 07/23/2024 CLINICAL DATA:  New onset headache EXAM: CT HEAD WITHOUT CONTRAST TECHNIQUE: Contiguous axial images were obtained from the base of the skull through the vertex without intravenous contrast. RADIATION DOSE REDUCTION: This exam was performed according to the departmental dose-optimization program which includes automated exposure control, adjustment of the mA and/or kV according to patient size and/or use of iterative reconstruction technique. COMPARISON:  MRI 02/28/2024 FINDINGS: Brain: Interval development of a hyperdense mass within the left occipital lobe, this measures 2.8 x 1.8 by 2.9 cm. Considerable surrounding vasogenic edema. Interval heterogenous low-density in the right cerebellum with scattered areas of hyperdensity. Mass effect on fourth ventricle which is narrowed and displaced to the left. Hypodense edema probably extends to the right posterior pons. Additional focus of low density in the right frontal lobe. The ventricles are slightly enlarged compared with the prior MRI. No midline shift Vascular: No hyperdense vessels.  No unexpected calcification Skull: Normal. Negative for fracture or focal lesion. Sinuses/Orbits: No acute finding. Other: None IMPRESSION: 1. Interval development of a 2.9 cm hyperdense (?hemorrhagic)  mass within the left occipital lobe with considerable surrounding vasogenic edema. Interval heterogenous low-density in the right cerebellum with scattered areas of hyperdensity, also  suspicious for mass lesion but with possible petechial foci of hemorrhage or mineralization. Additional focus of low density in the right frontal lobe. Findings are suspicious for metastatic disease. Further evaluation with MRI with and without contrast is recommended. 2. Mass effect on the fourth ventricle which is narrowed and displaced to the left. Interval mild lateral and third ventricular enlargement compared with the prior MRI. Critical Value/emergent results were called by telephone at the time of interpretation on 07/23/2024 at 5:28 pm to provider Dr. Gennaro, Who verbally acknowledged these results. Electronically Signed   By: Luke Bun M.D.   On: 07/23/2024 17:30   DG Chest Port 1 View Result Date: 07/20/2024 EXAM: 1 VIEW(S) XRAY OF THE CHEST 07/20/2024 09:41:00 PM COMPARISON: Comparison with 06/01/2024. CLINICAL HISTORY: Nausea, vomiting, and dizziness for 2 days. FINDINGS: LINES, TUBES AND DEVICES: Port-type central venous catheter with tip over the low SVC (Superior Vena Cava) region. LUNGS AND PLEURA: Lungs are clear. Pulmonary vascularity is normal. No pleural effusion. No pneumothorax. HEART AND MEDIASTINUM: Heart size is normal. Mediastinal contours appear intact. Calcification of the aorta. BONES AND SOFT TISSUES: Postoperative changes in the right shoulder. No acute osseous abnormality. IMPRESSION: 1. No acute cardiopulmonary abnormality. Electronically signed by: Elsie Gravely MD 07/20/2024 09:54 PM EST RP Workstation: [id number]       IMPRESSION/PLAN: 1. 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 Dr. Dewey has reviewed the patient's course to date and has evaluated the imaging from his MRI scan. Dr. Joshua recommends resection of  the cerebellar lesion given the pressure and concern of obstruction of the 4th ventricle and compression of the brainstem. Dr. Dewey would offer postoperative stereotactic radiosurgery [facility]) after he heals from surgery and at that time would also treat with fractionation to that and the additional two lesions. In the meantime the patient will continue dexamethasone  as well.  We discussed the risks, benefits, short, and long term effects of radiotherapy, as well as the palliative intent, and the patient is interested in proceeding at the appropriate time. Dr. Dewey discusses the delivery and logistics of radiotherapy and anticipates a course of 3 fractions radiotherapy every other day. We will need 3T MRI scan prior to proceeding, and simulation with IV contrast. He will sign written consent to proceed closer to the time of MRI and simulation. He will follow up with Dr. Sherrod as an outpatient as well and will likely continue immunotherapy. He will continue steroids and these will be tapered at the conclusion of radiation. His sign is in agreement to proceed.   In a visit lasting 65 minutes, greater than 50% of the time was spent by phone and in floor time discussing the patient's condition, in preparation for the discussion, and coordinating the patient's care.       Waylyn KYM Husband, [facility]   **Disclaimer: This note was dictated with voice recognition software. Similar sounding words can inadvertently be transcribed and this note may contain transcription errors which may not have been corrected upon publication of note.**  "

## 2024-07-24 NOTE — TOC Initial Note (Signed)
 Transition of Care [facility]) - Initial/Assessment Note    Patient Details  Name: Charles Grimes MRN: [id number] Date of Birth: [birth year]  Transition of Care [facility]) CM/SW Contact:    Charles LITTIE Agar, RN Phone [phone]  07/24/2024, 3:22 PM  Clinical Narrative:                 Inpatient care manager following patient admitted from home alone where he functions independently. Patient presented to the ED with headache, nausea/vomiting and imbalance. CM at bedside patient is having pain and gives verbal consent for family at bedside to answer assessment questions. Patient has PCP Charles Grimes, Charles Mt, DO ). Currently patient has no DME or HH needs. Patient has insurance and access to affordable medications. Currently there are no inpatient care manager needs. CM following .   Expected Discharge Plan: Home/Self Care Barriers to Discharge: Continued Medical Work up   Patient Goals and CMS Choice Patient states their goals for this hospitalization and ongoing recovery are:: Wants to feel better          Expected Discharge Plan and Services In-house Referral: NA Discharge Planning Services: CM Consult Post Acute Care Choice: NA Living arrangements for the past 2 months: Apartment                   DME Agency: NA       HH Arranged: NA HH Agency: NA        Prior Living Arrangements/Services Living arrangements for the past 2 months: Apartment Lives with:: Self Patient language and need for interpreter reviewed:: Yes Do you feel safe going back to the place where you live?: Yes      Need for Family Participation in Patient Care: Yes (Comment) Care giver support system in place?: Yes (comment) Current home services:  (n/a) Criminal Activity/Legal Involvement Pertinent to Current Situation/Hospitalization: No - Comment as needed  Activities of Daily Living   ADL Screening (condition at time of admission) Independently performs ADLs?: Yes (appropriate for developmental  age) Is the patient deaf or have difficulty hearing?: No Does the patient have difficulty seeing, even when wearing glasses/contacts?: No Does the patient have difficulty concentrating, remembering, or making decisions?: No  Permission Sought/Granted Permission sought to share information with : Family Supports Permission granted to share information with : Yes, Verbal Permission Granted  Share Information with NAME: Charles Grimes son     Permission granted to share info w Relationship: son  Permission granted to share info w Contact Information: [phone]  Emotional Assessment Appearance:: Appears stated age Attitude/Demeanor/Rapport: Unable to Assess (patient currently in pain requested that family at bedside assist with assessment questions) Affect (typically observed): Overwhelmed Orientation: : Oriented to Self, Oriented to Place, Oriented to  Time, Oriented to Situation Alcohol / Substance Use: Not Applicable Psych Involvement: No (comment)  Admission diagnosis:  Dizziness [R42] Brain mass [G93.89] Lung cancer metastatic to brain (HCC) [C34.90, C79.31] Nausea and vomiting, unspecified vomiting type [R11.2] Patient Active Problem List   Diagnosis Date Noted   Lung cancer metastatic to brain (HCC) 07/23/2024   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) 03/26/2024   Mediastinal adenopathy 02/19/2024   Lung mass 02/12/2024   Primary osteoarthritis of right knee 11/12/2023   Chronic pain of both shoulders 07/10/2023   Gastroesophageal reflux disease 06/04/2023   Abdominal pain 05/17/2023   GAD (generalized anxiety disorder) 08/16/2021   Recurrent major depressive disorder, in full remission 08/16/2021   Leukocytosis 03/18/2021   Hypocalcemia 03/18/2021  Pancreatitis 03/17/2021   Centrilobular emphysema (HCC) 02/08/2021   Tobacco abuse 02/25/2018   Lateral epicondylitis of left elbow 02/25/2018   OSA (obstructive sleep apnea) 06/21/2017   History of colon  polyps 12/29/2016   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   HNP (herniated nucleus pulposus), lumbar 10/07/2014   Prostate cancer screening 06/10/2014   Visit for preventive health examination 06/10/2014   Fall at home 05/16/2014   Anxiety and depression 01/11/2014   Colon cancer screening 01/11/2014   Former heavy cigarette smoker (20-39 per day) 09/18/2013   Coronary artery disease, non-occlusive 08/17/2013   Essential hypertension 07/28/2013   Dyslipidemia 07/28/2013   Family history of premature CAD    Family history of factor V Leiden mutation 06/17/2013   Obesity (BMI 30-39.9) 05/25/2013   Shortness of breath on exertion 05/23/2013   Intermittent claudication 05/23/2013   Nonischemic cardiomyopathy - mild 05/17/2013   PCP:  Frann Charles Mt, DO Pharmacy:   [facility] [address] ([address]), [place] [address] [place] ([place]) [place] [address] Phone: [phone] Fax: [phone]     Social Drivers of Health (SDOH) Social History: SDOH Screenings   Food Insecurity: No Food Insecurity (07/24/2024)  Housing: Low Risk (07/24/2024)  Transportation Needs: No Transportation Needs (07/24/2024)  Utilities: Not At Risk (07/24/2024)  Alcohol Screen: Low Risk (10/02/2023)  Depression (PHQ2-9): Low Risk (07/08/2024)  Financial Resource Strain: Low Risk (10/02/2023)  Physical Activity: Inactive (10/02/2023)  Social Connections: Socially Isolated (07/24/2024)  Stress: No Stress Concern Present (10/02/2023)  Tobacco Use: Medium Risk (07/24/2024)  Health Literacy: Adequate Health Literacy (10/02/2023)   SDOH Interventions:     Readmission Risk Interventions    07/24/2024    2:54 PM  Readmission Risk Prevention Plan  Transportation Screening Complete  PCP or Specialist Appt within 3-5 Days Complete  HRI or Home Care Consult Complete  Social Work Consult for Recovery Care Planning/Counseling Complete  Palliative Care Screening Not Applicable  Medication  Review Oceanographer) Referral to Pharmacy

## 2024-07-25 ENCOUNTER — Inpatient Hospital Stay (HOSPITAL_COMMUNITY): Admitting: Anesthesiology

## 2024-07-25 ENCOUNTER — Ambulatory Visit (HOSPITAL_COMMUNITY): Admit: 2024-07-25 | Source: Other Acute Inpatient Hospital

## 2024-07-25 ENCOUNTER — Encounter (HOSPITAL_COMMUNITY): Payer: Self-pay | Admitting: Internal Medicine

## 2024-07-25 ENCOUNTER — Encounter (HOSPITAL_COMMUNITY)
Admission: EM | Disposition: A | Discharge status: Home Health | Payer: Self-pay | Source: Home / Self Care | Attending: Neurological Surgery

## 2024-07-25 DIAGNOSIS — C801 Malignant (primary) neoplasm, unspecified: Secondary | ICD-10-CM | POA: Diagnosis not present

## 2024-07-25 DIAGNOSIS — Z87891 Personal history of nicotine dependence: Secondary | ICD-10-CM | POA: Diagnosis not present

## 2024-07-25 DIAGNOSIS — I10 Essential (primary) hypertension: Secondary | ICD-10-CM

## 2024-07-25 DIAGNOSIS — R112 Nausea with vomiting, unspecified: Secondary | ICD-10-CM

## 2024-07-25 DIAGNOSIS — C7931 Secondary malignant neoplasm of brain: Secondary | ICD-10-CM | POA: Diagnosis not present

## 2024-07-25 DIAGNOSIS — J449 Chronic obstructive pulmonary disease, unspecified: Secondary | ICD-10-CM

## 2024-07-25 DIAGNOSIS — Z9889 Other specified postprocedural states: Secondary | ICD-10-CM

## 2024-07-25 HISTORY — PX: CRANIOTOMY: SHX93

## 2024-07-25 LAB — TYPE AND SCREEN
ABO/RH(D): A NEG
Antibody Screen: NEGATIVE

## 2024-07-25 LAB — ABO/RH: ABO/RH(D): A NEG

## 2024-07-25 MED ORDER — DEXAMETHASONE SODIUM PHOSPHATE 4 MG/ML IJ SOLN
4.0000 mg | Freq: Four times a day (QID) | INTRAMUSCULAR | Status: AC
  Administered 2024-07-26 – 2024-07-27 (×4): 4 mg via INTRAVENOUS
  Filled 2024-07-25 (×4): qty 1

## 2024-07-25 MED ORDER — FENTANYL CITRATE (PF) 100 MCG/2ML IJ SOLN
INTRAMUSCULAR | Status: AC
  Filled 2024-07-25: qty 2

## 2024-07-25 MED ORDER — SUGAMMADEX SODIUM 200 MG/2ML IV SOLN
INTRAVENOUS | Status: DC | PRN
  Administered 2024-07-25: 200 mg via INTRAVENOUS

## 2024-07-25 MED ORDER — LIDOCAINE-EPINEPHRINE 1 %-1:100000 IJ SOLN
INTRAMUSCULAR | Status: AC
  Filled 2024-07-25: qty 1

## 2024-07-25 MED ORDER — PROMETHAZINE HCL 25 MG PO TABS: 12.5000 mg | ORAL_TABLET | ORAL | Status: DC | PRN

## 2024-07-25 MED ORDER — ONDANSETRON HCL 4 MG/2ML IJ SOLN
4.0000 mg | INTRAMUSCULAR | Status: DC | PRN
  Administered 2024-07-26: 4 mg via INTRAVENOUS
  Filled 2024-07-25: qty 2

## 2024-07-25 MED ORDER — SODIUM CHLORIDE 0.9 % IV SOLN: INTRAVENOUS | Status: DC

## 2024-07-25 MED ORDER — PROPOFOL 10 MG/ML IV BOLUS
INTRAVENOUS | Status: DC | PRN
  Administered 2024-07-25: 80 mg via INTRAVENOUS
  Administered 2024-07-25: 120 mg via INTRAVENOUS

## 2024-07-25 MED ORDER — MIDAZOLAM HCL 2 MG/2ML IJ SOLN
INTRAMUSCULAR | Status: AC
  Filled 2024-07-25: qty 2

## 2024-07-25 MED ORDER — HEMOSTATIC AGENTS (NO CHARGE) OPTIME
TOPICAL | Status: DC | PRN
  Administered 2024-07-25: 1 via TOPICAL

## 2024-07-25 MED ORDER — POTASSIUM CHLORIDE IN NACL 20-0.9 MEQ/L-% IV SOLN
INTRAVENOUS | Status: DC
  Filled 2024-07-25 (×3): qty 1000

## 2024-07-25 MED ORDER — THROMBIN 20000 UNITS EX SOLR
CUTANEOUS | Status: AC
  Filled 2024-07-25: qty 20000

## 2024-07-25 MED ORDER — DEXAMETHASONE SOD PHOSPHATE PF 10 MG/ML IJ SOLN
6.0000 mg | Freq: Four times a day (QID) | INTRAMUSCULAR | Status: AC
  Administered 2024-07-25 – 2024-07-26 (×4): 6 mg via INTRAVENOUS
  Filled 2024-07-25 (×4): qty 1

## 2024-07-25 MED ORDER — LIDOCAINE 2% (20 MG/ML) 5 ML SYRINGE
INTRAMUSCULAR | Status: DC | PRN
  Administered 2024-07-25: 50 mg via INTRAVENOUS

## 2024-07-25 MED ORDER — MIDAZOLAM HCL (PF) 2 MG/2ML IJ SOLN
INTRAMUSCULAR | Status: DC | PRN
  Administered 2024-07-25: 2 mg via INTRAVENOUS

## 2024-07-25 MED ORDER — ARFORMOTEROL TARTRATE 15 MCG/2ML IN NEBU
15.0000 ug | INHALATION_SOLUTION | Freq: Two times a day (BID) | RESPIRATORY_TRACT | Status: DC
  Administered 2024-07-25 – 2024-07-28 (×7): 15 ug via RESPIRATORY_TRACT
  Filled 2024-07-25 (×8): qty 2

## 2024-07-25 MED ORDER — PHENYLEPHRINE HCL-NACL 20-0.9 MG/250ML-% IV SOLN
INTRAVENOUS | Status: DC | PRN
  Administered 2024-07-25: 10 ug/min via INTRAVENOUS

## 2024-07-25 MED ORDER — SENNA 8.6 MG PO TABS
1.0000 | ORAL_TABLET | Freq: Two times a day (BID) | ORAL | Status: DC
  Administered 2024-07-26 – 2024-07-29 (×6): 8.6 mg via ORAL
  Filled 2024-07-25 (×6): qty 1

## 2024-07-25 MED ORDER — LABETALOL HCL 5 MG/ML IV SOLN
10.0000 mg | INTRAVENOUS | Status: DC | PRN
  Administered 2024-07-25: 20 mg via INTRAVENOUS
  Filled 2024-07-25: qty 4

## 2024-07-25 MED ORDER — THROMBIN 5000 UNITS EX KIT
PACK | CUTANEOUS | Status: AC
  Filled 2024-07-25: qty 1

## 2024-07-25 MED ORDER — ONDANSETRON HCL 4 MG/2ML IJ SOLN
INTRAMUSCULAR | Status: DC | PRN
  Administered 2024-07-25: 4 mg via INTRAVENOUS

## 2024-07-25 MED ORDER — CHLORHEXIDINE GLUCONATE 0.12 % MT SOLN: 15.0000 mL | Freq: Once | OROMUCOSAL | Status: AC

## 2024-07-25 MED ORDER — PROPOFOL 10 MG/ML IV BOLUS
INTRAVENOUS | Status: AC
  Filled 2024-07-25: qty 20

## 2024-07-25 MED ORDER — REVEFENACIN 175 MCG/3ML IN SOLN
175.0000 ug | Freq: Every day | RESPIRATORY_TRACT | Status: DC
  Administered 2024-07-26 – 2024-07-28 (×3): 175 ug via RESPIRATORY_TRACT
  Filled 2024-07-25 (×4): qty 3

## 2024-07-25 MED ORDER — THROMBIN 20000 UNITS EX SOLR
CUTANEOUS | Status: DC | PRN
  Administered 2024-07-25: 20 mL via TOPICAL

## 2024-07-25 MED ORDER — BACITRACIN ZINC 500 UNIT/GM EX OINT
TOPICAL_OINTMENT | CUTANEOUS | Status: AC
  Filled 2024-07-25: qty 28.35

## 2024-07-25 MED ORDER — PANTOPRAZOLE SODIUM 40 MG IV SOLR
40.0000 mg | Freq: Every day | INTRAVENOUS | Status: DC
  Administered 2024-07-25 – 2024-07-26 (×2): 40 mg via INTRAVENOUS
  Filled 2024-07-25 (×2): qty 10

## 2024-07-25 MED ORDER — CEFAZOLIN SODIUM-DEXTROSE 2-4 GM/100ML-% IV SOLN
INTRAVENOUS | Status: AC
  Filled 2024-07-25: qty 100

## 2024-07-25 MED ORDER — CHLORHEXIDINE GLUCONATE CLOTH 2 % EX PADS
6.0000 | MEDICATED_PAD | Freq: Every day | CUTANEOUS | Status: DC
  Administered 2024-07-25 – 2024-07-28 (×4): 6 via TOPICAL

## 2024-07-25 MED ORDER — DEXAMETHASONE SOD PHOSPHATE PF 10 MG/ML IJ SOLN
INTRAMUSCULAR | Status: DC | PRN
  Administered 2024-07-25: 10 mg via INTRAVENOUS

## 2024-07-25 MED ORDER — ACETAMINOPHEN 325 MG PO TABS
650.0000 mg | ORAL_TABLET | ORAL | Status: DC | PRN
  Administered 2024-07-27 – 2024-07-29 (×2): 650 mg via ORAL
  Filled 2024-07-25 (×2): qty 2

## 2024-07-25 MED ORDER — FENTANYL CITRATE (PF) 100 MCG/2ML IJ SOLN
25.0000 ug | INTRAMUSCULAR | Status: DC | PRN
  Administered 2024-07-25 (×2): 25 ug via INTRAVENOUS

## 2024-07-25 MED ORDER — CEFAZOLIN SODIUM-DEXTROSE 2-4 GM/100ML-% IV SOLN: 2.0000 g | INTRAVENOUS | Status: DC

## 2024-07-25 MED ORDER — CHLORHEXIDINE GLUCONATE CLOTH 2 % EX PADS: 6.0000 | MEDICATED_PAD | Freq: Once | CUTANEOUS | Status: DC

## 2024-07-25 MED ORDER — VANCOMYCIN HCL 1000 MG IV SOLR
INTRAVENOUS | Status: DC | PRN
  Administered 2024-07-25: 1500 mg via INTRAVENOUS

## 2024-07-25 MED ORDER — MORPHINE SULFATE (PF) 2 MG/ML IV SOLN
1.0000 mg | INTRAVENOUS | Status: DC | PRN
  Administered 2024-07-25 – 2024-07-27 (×7): 2 mg via INTRAVENOUS
  Filled 2024-07-25 (×7): qty 1

## 2024-07-25 MED ORDER — THROMBIN 5000 UNITS EX SOLR
OROMUCOSAL | Status: DC | PRN
  Administered 2024-07-25 (×2): 5 mL via TOPICAL

## 2024-07-25 MED ORDER — ONDANSETRON HCL 4 MG PO TABS: 4.0000 mg | ORAL_TABLET | ORAL | Status: DC | PRN

## 2024-07-25 MED ORDER — LIDOCAINE-EPINEPHRINE 1 %-1:100000 IJ SOLN
INTRAMUSCULAR | Status: DC | PRN
  Administered 2024-07-25: 6 mL

## 2024-07-25 MED ORDER — BACITRACIN ZINC 500 UNIT/GM EX OINT
TOPICAL_OINTMENT | CUTANEOUS | Status: DC | PRN
  Administered 2024-07-25: 1 via TOPICAL

## 2024-07-25 MED ORDER — HYDROCODONE-ACETAMINOPHEN 5-325 MG PO TABS
1.0000 | ORAL_TABLET | ORAL | Status: DC | PRN
  Administered 2024-07-26 – 2024-07-28 (×10): 1 via ORAL
  Filled 2024-07-25 (×10): qty 1

## 2024-07-25 MED ORDER — BUDESONIDE 0.5 MG/2ML IN SUSP
0.5000 mg | Freq: Two times a day (BID) | RESPIRATORY_TRACT | Status: DC
  Administered 2024-07-25 – 2024-07-28 (×7): 0.5 mg via RESPIRATORY_TRACT
  Filled 2024-07-25 (×8): qty 2

## 2024-07-25 MED ORDER — DEXAMETHASONE SODIUM PHOSPHATE 4 MG/ML IJ SOLN
4.0000 mg | Freq: Three times a day (TID) | INTRAMUSCULAR | Status: DC
  Administered 2024-07-27 – 2024-07-28 (×2): 4 mg via INTRAVENOUS
  Filled 2024-07-25 (×2): qty 1

## 2024-07-25 MED ORDER — CHLORHEXIDINE GLUCONATE 0.12 % MT SOLN
OROMUCOSAL | Status: AC
  Administered 2024-07-25: 15 mL via OROMUCOSAL
  Filled 2024-07-25: qty 15

## 2024-07-25 MED ORDER — ACETAMINOPHEN 650 MG RE SUPP: 650.0000 mg | RECTAL | Status: DC | PRN

## 2024-07-25 MED ORDER — ORAL CARE MOUTH RINSE: 15.0000 mL | Freq: Once | OROMUCOSAL | Status: AC

## 2024-07-25 MED ORDER — ROCURONIUM BROMIDE 10 MG/ML (PF) SYRINGE
PREFILLED_SYRINGE | INTRAVENOUS | Status: DC | PRN
  Administered 2024-07-25: 50 mg via INTRAVENOUS
  Administered 2024-07-25: 20 mg via INTRAVENOUS
  Administered 2024-07-25: 30 mg via INTRAVENOUS

## 2024-07-25 MED ORDER — FENTANYL CITRATE (PF) 250 MCG/5ML IJ SOLN
INTRAMUSCULAR | Status: DC | PRN
  Administered 2024-07-25: 100 ug via INTRAVENOUS

## 2024-07-25 NOTE — Progress Notes (Signed)
 Report called to [facility], CHG bath completed, pain meds to be given when transport arrives, pt will be saline locked as well.

## 2024-07-25 NOTE — Progress Notes (Signed)
 " PROGRESS NOTE    Charles Grimes  FMW:[id number] DOB: 12-Oct-1955 DOA: 07/23/2024 PCP: [name], DO  Subjective: Patient reports ongoing headache but improved from before. Also has nausea but not as bad, hasn't had vomiting in a few days now.     Hospital Course: 69 year old male with PMH of lung adenocarcinoma (completed chemoradiation, currently immunotherapy), CAD, who presented to the ED with headache, nausea/vomiting and imbalance. CT head showed new mass, MRI was done that showed mass in the right cerebellum with surrounding edema, mass effect on the fourth ventricle, and a mass in the left occipital lobe with surrounding edema, and lesion in the anterior right frontal lobe, findings concerning for intracranial metastatic disease. Neurosurgery and medical oncology and radiation oncology were consulted. Plan for craniotomy with surgical resection of the cerebellar lesion, and transfer to [facility] today    Assessment and Plan:  Lung adenocarcinoma, now with newly found brain mets - neurosurgery following, plan for surgical resection of the cerebellar lesion today and transfer to [facility]   - oncology consulted, appreciate recommendations - radiation oncology consulted, plan for SRS once recovered from his surgery  - continue Decadron , to be tapered by rad onc  - continue antiemetics and IV morphine  for pain control    HTN - awaiting med rec, is on lisinopril  at home per home medications.  Currently on hydralazine  as needed, and normotensive at this time.  Resume lisinopril  as appropriate   CAD - hold aspirin  for now pending surgery  - continue statin     DVT prophylaxis: SCDs Start: 07/23/24 1859   Code Status: Full Code Family Communication: Updated at bedside Disposition Plan: Transfer to [facility] Reason for continuing need for hospitalization: Need for surgery   Objective: Vitals:   07/24/24 1100 07/24/24 1520 07/24/24 1900 07/25/24 0400  BP: (!) 111/59 (!) 145/72 (!) 142/74  (!) 151/74  Pulse: (!) 48 (!) 52 (!) 55 (!) 50  Resp: 16   15  Temp: 97.8 F (36.6 C) 97.8 F (36.6 C) 98.1 F (36.7 C) 97.6 F (36.4 C)  TempSrc: Oral Oral Oral Oral  SpO2: 94% 93% 95% 93%   No intake or output data in the 24 hours ending 07/25/24 0852 There were no vitals filed for this visit.  Examination:  Physical Exam Vitals and nursing note reviewed.  Constitutional:      General: He is not in acute distress. Cardiovascular:     Rate and Rhythm: Normal rate.  Pulmonary:     Effort: No respiratory distress.  Abdominal:     General: There is no distension.  Musculoskeletal:     Right lower leg: No edema.     Left lower leg: No edema.  Neurological:     Mental Status: Mental status is at baseline.     Data Reviewed: I have personally reviewed following labs and imaging studies  CBC: Recent Labs  Lab 07/20/24 2202 07/23/24 1420 07/24/24 0512  WBC 4.5 8.4 6.8  NEUTROABS 3.5  --   --   HGB 12.3* 12.3* 12.1*  HCT 37.6* 38.4* 36.8*  MCV 100.5* 101.9* 100.0  PLT 150 152 148*   Basic Metabolic Panel: Recent Labs  Lab 07/20/24 2202 07/23/24 1420 07/24/24 0512  NA 139 142 139  K 4.4 4.0 4.2  CL 105 105 102  CO2 23 23 23   GLUCOSE 90 99 112*  BUN 19 19 18   CREATININE 0.82 0.78 0.78  CALCIUM  9.0 9.5 9.1   GFR: Estimated Creatinine Clearance:  93.8 mL/min (by C-G formula based on SCr of 0.78 mg/dL). Liver Function Tests: Recent Labs  Lab 07/20/24 2202 07/23/24 1420 07/24/24 0512  AST 27 24 19   ALT 12 12 14   ALKPHOS 124 118 113  BILITOT 0.6 0.8 0.7  PROT 7.4 7.8 7.2  ALBUMIN 3.7 4.1 3.9   Recent Labs  Lab 07/20/24 2202 07/23/24 1420  LIPASE 12 12   No results for input(s): AMMONIA in the last 168 hours. Coagulation Profile: Recent Labs  Lab 07/24/24 0512  INR 1.2   Cardiac Enzymes: No results for input(s): CKTOTAL, CKMB, CKMBINDEX, TROPONINI in the last 168 hours. ProBNP, BNP (last 5 results) Recent Labs    04/27/24 1941  05/06/24 1611  PROBNP 55.6 92.2   HbA1C: No results for input(s): HGBA1C in the last 72 hours. CBG: Recent Labs  Lab 07/23/24 1607 07/23/24 1728  GLUCAP 98 88   Lipid Profile: No results for input(s): CHOL, HDL, LDLCALC, TRIG, CHOLHDL, LDLDIRECT in the last 72 hours. Thyroid  Function Tests: No results for input(s): TSH, T4TOTAL, FREET4, T3FREE, THYROIDAB in the last 72 hours. Anemia Panel: No results for input(s): VITAMINB12, FOLATE, FERRITIN, TIBC, IRON, RETICCTPCT in the last 72 hours. Sepsis Labs: No results for input(s): PROCALCITON, LATICACIDVEN in the last 168 hours.  Recent Results (from the past 240 hours)  Resp panel by RT-PCR (RSV, Flu A&B, Covid) Anterior Nasal Swab     Status: None   Collection Time: 07/20/24 10:02 PM   Specimen: Anterior Nasal Swab  Result Value Ref Range Status   SARS Coronavirus 2 by RT PCR NEGATIVE NEGATIVE Final    Comment: (NOTE) SARS-CoV-2 target nucleic acids are NOT DETECTED.  The SARS-CoV-2 RNA is generally detectable in upper respiratory specimens during the acute phase of infection. The lowest concentration of SARS-CoV-2 viral copies this assay can detect is 138 copies/mL. A negative result does not preclude SARS-Cov-2 infection and should not be used as the sole basis for treatment or other patient management decisions. A negative result may occur with  improper specimen collection/handling, submission of specimen other than nasopharyngeal swab, presence of viral mutation(s) within the areas targeted by this assay, and inadequate number of viral copies(<138 copies/mL). A negative result must be combined with clinical observations, patient history, and epidemiological information. The expected result is Negative.  Fact Sheet for Patients:  bloggercourse.com  Fact Sheet for Healthcare Providers:  seriousbroker.it  This test is no t yet  approved or cleared by the [place]  FDA and  has been authorized for detection and/or diagnosis of SARS-CoV-2 by FDA under an Emergency Use Authorization (EUA). This EUA will remain  in effect (meaning this test can be used) for the duration of the COVID-19 declaration under Section 564(b)(1) of the Act, 21 U.S.C.section 360bbb-3(b)(1), unless the authorization is terminated  or revoked sooner.       Influenza A by PCR NEGATIVE NEGATIVE Final   Influenza B by PCR NEGATIVE NEGATIVE Final    Comment: (NOTE) The Xpert Xpress SARS-CoV-2/FLU/RSV plus assay is intended as an aid in the diagnosis of influenza from Nasopharyngeal swab specimens and should not be used as a sole basis for treatment. Nasal washings and aspirates are unacceptable for Xpert Xpress SARS-CoV-2/FLU/RSV testing.  Fact Sheet for Patients: bloggercourse.com  Fact Sheet for Healthcare Providers: seriousbroker.it  This test is not yet approved or cleared by the [place]  FDA and has been authorized for detection and/or diagnosis of SARS-CoV-2 by FDA under an Emergency Use Authorization (EUA). This EUA  will remain in effect (meaning this test can be used) for the duration of the COVID-19 declaration under Section 564(b)(1) of the Act, 21 U.S.C. section 360bbb-3(b)(1), unless the authorization is terminated or revoked.     Resp Syncytial Virus by PCR NEGATIVE NEGATIVE Final    Comment: (NOTE) Fact Sheet for Patients: bloggercourse.com  Fact Sheet for Healthcare Providers: seriousbroker.it  This test is not yet approved or cleared by the [place]  FDA and has been authorized for detection and/or diagnosis of SARS-CoV-2 by FDA under an Emergency Use Authorization (EUA). This EUA will remain in effect (meaning this test can be used) for the duration of the COVID-19 declaration under Section 564(b)(1) of  the Act, 21 U.S.C. section 360bbb-3(b)(1), unless the authorization is terminated or revoked.  Performed at [facility], [address], [place], [place] [address]      Radiology Studies: MR Brain W and Wo Contrast Result Date: 07/23/2024 EXAM: MRI BRAIN WITH AND WITHOUT CONTRAST 07/23/2024 07:10:00 PM TECHNIQUE: Multiplanar multisequence MRI of the head/brain was performed with and without the administration of 7 mL of gadobutrol  (GADAVIST ) 1 MMOL/ML injection. COMPARISON: Same day CT head. CLINICAL HISTORY: brain mass on ct brain mass on ct FINDINGS: BRAIN AND VENTRICLES: No acute infarct. No midline shift. No hydrocephalus. The sella is unremarkable. Normal flow voids. There is a 3.7 x 3.6 x 2.8 cm dominant heterogeneously enhancing mass in the right cerebellum with surrounding edema extending into the superior cerebellum and cerebellar vermis. This is associated with mass effect on the fourth ventricle and the right middle cerebellar peduncle. An additional 2.2 x 1.5 x 3.1 cm mass is present in the left occipital lobe with surrounding vasogenic edema and local mass effect. Edema partially extends to the left posterior aspect of the splenium and the corpus callosum. An additional 1.2 x 1.2 x 1.3 cm enhancing lesion is noted in the anterior right frontal lobe involving the right middle frontal gyrus with mild associated vasogenic edema. The left occipital and right cerebellar lesions demonstrate prominent areas of susceptibility suggestive of intralesional hemorrhage. Edema extending into the left periatrial white matter results in partial effacement of the atrium of the left lateral ventricle as well as effacement of the left occipital and left temporal horns. Mass effect in the posterior fossa results in anterior displacement of the brainstem with crowding of the prepontine and cerebellopontine angle cisterns. There is anterior displacement and mass effect on the basilar artery  without disruption of the flow void. There is mild ectopia of the right cerebellar tonsil extending below the foramen magnum by approximately 2.5 mm. Few scattered areas of T2 and FLAIR hyperintensity are present in the periventricular and subcortical white matter suggestive of mild chronic microvascular ischemic changes. ORBITS: No acute abnormality. SINUSES: Defect in the anterior nasal septum. BONES AND SOFT TISSUES: Normal bone marrow signal and enhancement. No acute soft tissue abnormality. IMPRESSION: 1. Dominant 3.7 x 3.6 x 2.8 cm heterogeneously enhancing mass in the right cerebellum with surrounding edema, mass effect on the fourth ventricle and right middle cerebellar peduncle, intralesional hemorrhage, and posterior fossa mass effect resulting in anterior displacement of the brainstem and basilar artery. 2. 2.2 x 1.5 x 3.1 cm mass in the left occipital lobe with surrounding edema, local mass effect, partial effacement of the left lateral ventricle. 3. 1.2 x 1.2 x 1.3 cm enhancing lesion in the anterior right frontal lobe with mild associated vasogenic edema. 4. Findings concerning for intracranial metastatic disease. Electronically signed by:  Donnice Mania MD MD 07/23/2024 08:52 PM EST RP Workstation: [id number]   CT CHEST ABDOMEN PELVIS W CONTRAST Result Date: 07/23/2024 EXAM: CT CHEST, ABDOMEN AND PELVIS WITH CONTRAST 07/23/2024 07:34:49 PM TECHNIQUE: CT of the chest, abdomen and pelvis was performed with the administration of 100 mL of iohexol  (OMNIPAQUE ) 300 MG/ML solution. Multiplanar reformatted images are provided for review. Automated exposure control, iterative reconstruction, and/or weight based adjustment of the mA/kV was utilized to reduce the radiation dose to as low as reasonably achievable. COMPARISON: Chest radiograph 07/20/2024, CT chest 06/19/2024, and CT abdomen and pelvis 05/06/2024. CLINICAL HISTORY: Metastatic disease evaluation; metastatic lung cancer. Ongoing nausea, vomiting,  dizziness, and headaches. FINDINGS: CHEST: MEDIASTINUM AND LYMPH NODES: Left hilar mass measures 2.3 x 2.3 cm, unchanged since prior study. This corresponds to non-treated neoplasm. No significant lymphadenopathy. The esophagus is not abnormally distended, but there is fluid in the esophagus suggesting reflux or dysmotility. Right central venous catheter with tip in the low SVC. Heart size is normal. No pericardial effusion. Calcification of the aorta. No aortic aneurysm or dissection. Central pulmonary arteries are patent without evidence of large pulmonary embolus. LUNGS AND PLEURA: Diffuse emphysematous changes in the lungs with scattered subpleural fibrosis. No focal consolidation or pulmonary edema. No pleural effusion or pneumothorax. Mild traction bronchiectasis in the bases. Scattered pulmonary nodules including representative nodule in the right upper lung, series 4 image 92, measuring 5 mm diameter. No change since previous study. ABDOMEN AND PELVIS: LIVER: Mild diffuse fatty infiltration of the liver. GALLBLADDER AND BILE DUCTS: Gallbladder and bile ducts are normal. No biliary ductal dilatation. SPLEEN: Spleen is normal. PANCREAS: Pancreas is normal. ADRENAL GLANDS: Adrenal glands are normal. KIDNEYS, URETERS AND BLADDER: Kidneys, ureters, and bladder are normal. No stones in the kidneys or ureters. No hydronephrosis. No perinephric or periureteral stranding. Urinary bladder is unremarkable. GI AND BOWEL: The stomach, small bowel, and colon are not abnormally distended. The stomach is filled with ingested material. The colon is stool-filled. No wall thickening or inflammatory stranding is noted. The appendix is normal. There is no bowel obstruction. REPRODUCTIVE ORGANS: Prostate gland is enlarged. PERITONEUM AND RETROPERITONEUM: No ascites. No free air. VASCULATURE: Aorta is normal in caliber. Calcification of the aorta. No aneurysm. ABDOMINAL AND PELVIS LYMPH NODES: No lymphadenopathy. BONES AND SOFT  TISSUES: Degenerative changes in the spine. No focal bone lesions. Compression of the L4 vertebra, unchanged since prior study. No focal soft tissue abnormality. IMPRESSION: 1. Left hilar mass measuring 2.3 x 2.3 cm, unchanged since prior study, consistent with known non-treated neoplasm. 2. No evidence of metastatic disease in the chest, abdomen, or pelvis. 3. Scattered pulmonary nodules, including a 5 mm right upper lobe nodule, unchanged since previous study. 4. Fluid in the esophagus, which can be seen with reflux or dysmotility. Electronically signed by: Elsie Gravely MD 07/23/2024 07:50 PM EST RP Workstation: [id number]   CT Head Wo Contrast Result Date: 07/23/2024 CLINICAL DATA:  New onset headache EXAM: CT HEAD WITHOUT CONTRAST TECHNIQUE: Contiguous axial images were obtained from the base of the skull through the vertex without intravenous contrast. RADIATION DOSE REDUCTION: This exam was performed according to the departmental dose-optimization program which includes automated exposure control, adjustment of the mA and/or kV according to patient size and/or use of iterative reconstruction technique. COMPARISON:  MRI 02/28/2024 FINDINGS: Brain: Interval development of a hyperdense mass within the left occipital lobe, this measures 2.8 x 1.8 by 2.9 cm. Considerable surrounding vasogenic edema. Interval heterogenous low-density in the right cerebellum with  scattered areas of hyperdensity. Mass effect on fourth ventricle which is narrowed and displaced to the left. Hypodense edema probably extends to the right posterior pons. Additional focus of low density in the right frontal lobe. The ventricles are slightly enlarged compared with the prior MRI. No midline shift Vascular: No hyperdense vessels.  No unexpected calcification Skull: Normal. Negative for fracture or focal lesion. Sinuses/Orbits: No acute finding. Other: None IMPRESSION: 1. Interval development of a 2.9 cm hyperdense (?hemorrhagic) mass  within the left occipital lobe with considerable surrounding vasogenic edema. Interval heterogenous low-density in the right cerebellum with scattered areas of hyperdensity, also suspicious for mass lesion but with possible petechial foci of hemorrhage or mineralization. Additional focus of low density in the right frontal lobe. Findings are suspicious for metastatic disease. Further evaluation with MRI with and without contrast is recommended. 2. Mass effect on the fourth ventricle which is narrowed and displaced to the left. Interval mild lateral and third ventricular enlargement compared with the prior MRI. Critical Value/emergent results were called by telephone at the time of interpretation on 07/23/2024 at 5:28 pm to provider Dr. Gennaro, Who verbally acknowledged these results. Electronically Signed   By: Luke Bun M.D.   On: 07/23/2024 17:30    Scheduled Meds:  dexamethasone  (DECADRON ) injection  4 mg Intravenous Q6H   Continuous Infusions:   LOS: 2 days   Time spent: 40 minutes  Casimer Dare, MD  [facility]  07/25/2024, 8:52 AM   "

## 2024-07-25 NOTE — Anesthesia Procedure Notes (Signed)
 Arterial Line Insertion Start/End1/03/2025 1:45 PM, 07/25/2024 1:50 PM Performed by: Tilford Franky BIRCH, MD, anesthesiologist  Patient location: Pre-op. Preanesthetic checklist: patient identified, IV checked, site marked, risks and benefits discussed, surgical consent, monitors and equipment checked, pre-op evaluation, timeout performed and anesthesia consent Lidocaine  1% used for infiltration Left, radial was placed Catheter size: 20 G Hand hygiene performed  and maximum sterile barriers used   Attempts: 1 Following insertion, dressing applied and Biopatch. Post procedure assessment: normal and unchanged  Post procedure complications: second provider assisted. Patient tolerated the procedure well with no immediate complications. Additional procedure comments: CRNA attempt x2.

## 2024-07-25 NOTE — Op Note (Signed)
 07/25/2024  4:23 PM  PATIENT:  Charles Grimes  69 y.o. male  PRE-OPERATIVE DIAGNOSIS: Large right cerebellar metastasis  POST-OPERATIVE DIAGNOSIS:  same  PROCEDURE: Right suboccipital craniectomy for resection of cerebellar metastasis  SURGEON:  Alm Molt, MD  ASSISTANTS: Suzen Pean, FNP  ANESTHESIA:   General  EBL: 100 ml  Total I/O In: 1075 [I.V.:700; IV Piggyback:375] Out: 200 [Urine:200]  BLOOD ADMINISTERED: none  DRAINS: none  SPECIMEN:  none  INDICATION FOR PROCEDURE: This patient presented with headaches and imbalance. Imaging showed multiple cranial metastasis.  He has a history of lung cancer.  There was a large right cerebellar lesion that we recommended for resection. Patient understood the risks, benefits, and alternatives and potential outcomes and wished to proceed.  PROCEDURE DETAILS: The patient was taken to the operating room and after induction of adequate generalized endotracheal anesthesia, the head was affixed in a 3 point Mayfield head rest and the patient was rolled into the prone position on chest rolls.  His suboccipital region was shaved and cleaned with alcohol.  We then marked our incision.  Midline to the right.  The skin was then prepped with DuraPrep and draped in the usual sterile fashion.  10 cc of local anesthesia was injected and a incision was made paramedian to the right from the inion down to the level of about C2.  We dissected down through the fascia to identify the suboccipital bone.  We widened the decompression laterally and open the muscle and fascia distally.  We placed cerebellar retractors.  We exposed the suboccipital bone.  We tried to identify where we felt the sinus was located.  A burr hole was placed, and we drilled the suboccipital bone to an eggshell with an acorn bit.  We then used a 4 mm Kerrison punch to perform a craniectomy.  We made the craniectomy about 4 cm x 4 cm.  Used a 15 blade scalpel to perform a durotomy and  then opened the dura in a cruciate fashion.  The cerebellum was under significant pressure and herniated out toward us .  We could see tumor and mucin.  Therefore we performed a corticectomy and sucked out the middle of the tumor to relax the cerebellum.  We then worked around the edges and used suction and bipolar cautery to remove the remainder of the tumor from the tumor bed.  We released some CSF from the foramen magnum.  This relaxed things nicely.  We continue to work circumferentially around the tumor bed edges and removed tumor until we felt like we had a gross total resection of the tumor.  We dried all the surgical bed with bipolar cautery.  We used cottonoids and cotton balls to help.  We also used Surgifoam.  We sent several pieces of tumor for permanent section.  Once the tumor was removed I dried the surgical bed with bipolar cautery and  Surgifoam, and then lined the surgical bed with Surgicel.  We then used a small bovine pericardium to show and a patch graft.  We aligned this with Gelfoam.  We then used a piece of mesh and secured this over the craniectomy.  We then irrigated with saline solution.  The muscle and fascia was then closed with interrupted 2-0 Vicryl suture.  Subcutaneous tissues were closed with 2-0 Vicryl.  The skin was then closed with staples a sterile dressing was applied. The patient was then taken out of the 3-point Mayfield headrest and awakened from general anesthesia, and transported to  the recovery room in stable condition. At the end of the procedure all sponge, needle, and instrument counts were correct.      PLAN OF CARE: Admit to inpatient   PATIENT DISPOSITION:  PACU - hemodynamically stable.   Delay start of Pharmacological VTE agent (>24hrs) due to surgical blood loss or risk of bleeding:  yes

## 2024-07-25 NOTE — H&P (Signed)
 Subjective: Patient is a 69 y.o. male admitted for brain masses. Onset of symptoms was a few days ago, unchanged since that time.  The pain is rated moderate, and is located at the frontal region.  Has been having some issues with balance.   MRI or CT showed multiple brain metastasis but a large right cerebellar metastasis with compression of the fourth ventricle.  We recommended resection of the lesion.  He has been seen by oncology and radiation oncology.  Past Medical History:  Diagnosis Date   Anginal pain    Anxiety    Arthritis    Coronary artery disease, non-occlusive 08/2013   Mild to moderate (40% OM1) single vessel CAD. Otherwise nonobstructed.   Depression    Dyslipidemia, goal LDL below 100    Essential hypertension    Family history of premature CAD    GERD (gastroesophageal reflux disease)    H/O skin disorder    Involving hands. Subsequently resolved.    Nonischemic cardiomyopathy (HCC) 05/2013   Non-ischemic: EF ~45% by Echo & Myoview  --> Non-obstructive CAD   Obesity (BMI 30.0-34.9)    OSA (obstructive sleep apnea) 06/21/2017   uses CPAP   Sleep apnea     Past Surgical History:  Procedure Laterality Date   COLONOSCOPY  2015   COLONOSCOPY WITH PROPOFOL   01/02/2017   Dr.Pyrtle   IR IMAGING GUIDED PORT INSERTION  04/02/2024   LEFT HEART CATHETERIZATION WITH CORONARY ANGIOGRAM  08/26/2013   Mild to moderate disease with 40-50% stenosis and OM1. Otherwise no significant CAD --  Surgeon: Charles LELON Clay, MD;  Location: [facility] CATH LAB;  Service: Cardiovascular;;   Lower extremity arterial Dopplers  06/11/2013   No occlusive disease   LUMBAR LAMINECTOMY/DECOMPRESSION MICRODISCECTOMY Right 10/07/2014   Procedure: LUMBAR LAMINECTOMY/DECOMPRESSION MICRODISCECTOMY 1 LEVEL;  Surgeon: Arley Helling, MD;  Location: [facility] NEURO ORS;  Service: Neurosurgery;  Laterality: Right;  Right L3-L4 Microdiscectomy   NM MYOVIEW  LTD  06/03/2013   Low risk study with no ischemia. EF roughly 44% with no  regional wall motion abnormalities noted   PFTs  06/16/2013   Normal Volumes &  Spirometry; Moderately reduced DLCO   POLYPECTOMY     SHOULDER HEMI-ARTHROPLASTY Right 05/28/2015   Procedure: RIGHT SHOULDER HEMI-ARTHROPLASTY CTA HEAD AND SUBSCAP REPAIR ;  Surgeon: Marcey Her, MD;  Location: [facility] OR;  Service: Orthopedics;  Laterality: Right;   TRANSTHORACIC ECHOCARDIOGRAM  06/11/2013   Mildly reduced EF: 45-50%. Mild anterior hypokinesis with incoordinate septal motion. No evidence of pulmonary hypertension   VIDEO BRONCHOSCOPY WITH ENDOBRONCHIAL ULTRASOUND Left 02/19/2024   Procedure: BRONCHOSCOPY, WITH EBUS;  Surgeon: Shelah Lamar RAMAN, MD;  Location: [facility] ENDOSCOPY;  Service: Pulmonary;  Laterality: Left;    Prior to Admission medications  Medication Sig Start Date End Date Taking? Authorizing Provider  arformoterol  (BROVANA ) 15 MCG/2ML NEBU Take 2 mLs (15 mcg total) by nebulization 2 (two) times daily. 05/01/24   Ruthell Lauraine FALCON, NP  budesonide  (PULMICORT ) 0.25 MG/2ML nebulizer solution Take 2 mLs (0.25 mg total) by nebulization 2 (two) times daily. 05/01/24 05/01/25  Ruthell Lauraine FALCON, NP  ipratropium (ATROVENT ) 0.02 % nebulizer solution Take 2.5 mLs (0.5 mg total) by nebulization 4 (four) times daily. 05/01/24   Ruthell Lauraine FALCON, NP  albuterol  (PROVENTIL ) (2.5 MG/3ML) 0.083% nebulizer solution Take 3 mLs (2.5 mg total) by nebulization every 6 (six) hours as needed for wheezing or shortness of breath. 04/27/24   Melvenia Motto, MD  albuterol  (VENTOLIN  HFA) 108 [phone] Base) MCG/ACT inhaler Inhale  2 puffs into the lungs every 6 (six) hours as needed for wheezing or shortness of breath. 02/25/24   Ruthell Lauraine FALCON, NP  ALPRAZolam  (XANAX ) 0.5 MG tablet Take 1 tablet (0.5 mg total) by mouth at bedtime as needed for anxiety. 10/24/23   [name], DO  alum & mag hydroxide-simeth (MAALOX MAX) 400-400-40 MG/5ML suspension Take 15 mLs by mouth every 6 (six) hours as needed for indigestion. 06/01/24   Kingsley,  Victoria K, DO  aspirin  EC 81 MG tablet Take 81 mg by mouth daily.    [provider]  atorvastatin  (LIPITOR) 10 MG tablet Take 1 tablet (10 mg total) by mouth daily. 10/24/23   [name], DO  budesonide -glycopyrrolate -formoterol (BREZTRI  AEROSPHERE) 160-9-4.8 MCG/ACT AERO inhaler Inhale 2 puffs into the lungs in the morning and at bedtime. 02/25/24   Ruthell Lauraine FALCON, NP  budesonide -glycopyrrolate -formoterol (BREZTRI  AEROSPHERE) 160-9-4.8 MCG/ACT AERO inhaler Inhale 2 puffs into the lungs in the morning and at bedtime. 02/25/24   Ruthell Lauraine FALCON, NP  clotrimazole  (MYCELEX ) 10 MG troche Take 1 tablet (10 mg total) by mouth 3 (three) times daily. 02/25/24   Ruthell Lauraine FALCON, NP  colchicine  0.6 MG tablet Take 1 tablet (0.6 mg total) by mouth daily as needed (gout flares). 02/19/24   Shelah Lamar RAMAN, MD  docusate sodium  (COLACE) 100 MG capsule Take 1 capsule (100 mg total) by mouth every 12 (twelve) hours. 04/14/24   Mannie Fairy DASEN, DO  HYDROcodone -acetaminophen  (NORCO/VICODIN) 5-325 MG tablet Take 1 tablet by mouth every 6 (six) hours as needed for moderate pain (pain score 4-6). 05/26/24   Shannon Agent, MD  lidocaine  (XYLOCAINE ) 2 % solution Use as directed 15 mLs in the mouth or throat every 6 (six) hours as needed for mouth pain. 04/28/24   Sherrod Sherrod, MD  lidocaine  (XYLOCAINE ) 2 % solution Use as directed 15 mLs in the mouth or throat every 4 (four) hours as needed for mouth pain. 06/01/24   Kingsley, Victoria K, DO  lisinopril  (ZESTRIL ) 10 MG tablet Take 1 tablet (10 mg total) by mouth daily. 01/14/24   [name], DO  methylPREDNISolone  (MEDROL  DOSEPAK) 4 MG TBPK tablet Use as instructed 05/12/24   Sherrod Sherrod, MD  metoCLOPramide  (REGLAN ) 10 MG tablet Take 1 tablet (10 mg total) by mouth every 8 (eight) hours as needed for nausea. 04/27/24   Melvenia Motto, MD  Multiple Vitamins-Minerals (CENTRUM SILVER 50+MEN) TABS Take 1 tablet by mouth daily.    [provider]  nitroGLYCERIN  (NITROSTAT ) 0.4 MG SL tablet Place 1 tablet (0.4 mg total) under the tongue every 5 (five) minutes as needed for chest pain. 02/08/21   [name], DO  NON FORMULARY Pt uses a c-pap nightly    [provider]  omeprazole  (PRILOSEC ) 20 MG capsule Take 1 capsule (20 mg total) by mouth daily. 01/14/24   [name], DO  ondansetron  (ZOFRAN -ODT) 4 MG disintegrating tablet Take 1 tablet (4 mg total) by mouth every 8 (eight) hours as needed. 07/21/24   Griselda Norris, MD  polyethylene glycol powder (MIRALAX ) 17 GM/SCOOP powder Take 17 g by mouth daily. Dissolve 1 capful (17g) in 4-8 ounces of liquid and take by mouth daily. 04/14/24   Mannie Fairy T, DO  sertraline  (ZOLOFT ) 50 MG tablet Take 2 tabs in the morning and 1 tab in the evening. 01/14/24   [name], DO  Spacer/Aero-Holding Chambers (AEROCHAMBER PLUS) Device Per patient request to help with inhaler management  and effectiveness. 04/28/24   Ruthell Lauraine FALCON, NP  sucralfate  (CARAFATE ) 1 g tablet Take 1 tablet (1 g total) by mouth 4 (four) times daily. Dissolve each tablet in 15 cc water before use. 06/01/24   Kingsley, Victoria K, DO  tiZANidine  (ZANAFLEX ) 4 MG tablet Take 1 tablet (4 mg total) by mouth at bedtime as needed for muscle spasms. 01/14/24   [name], DO  triamcinolone  cream (KENALOG ) 0.1 % Apply 1 Application topically 2 (two) times daily as needed (itch). 11/12/23   [name], DO   Allergies[1]  Social History   Tobacco Use   Smoking status: Former    Current packs/day: 1.00    Average packs/day: 1 pack/day for 42.0 years (42.0 ttl pk-yrs)    Types: Cigarettes   Smokeless tobacco: Never   Tobacco comments:    3 cigarettes a day 02/12/2024 KRD    Quit x1 month ago 02/12/2024  Substance Use Topics   Alcohol use: Not Currently    Comment: rarely    Family History  Problem Relation Age of Onset   Atrial fibrillation Mother         Alive at 55   COPD Mother    Hypertension Mother    Colonic polyp Mother    Hypertension Father        Alive at 60   Lung cancer Father        Chronic smoker   Factor V Leiden deficiency Father        Also Lupus anticoagulant   Heart attack Maternal Grandmother 48   Heart failure Paternal Grandmother    Diabetes Paternal Grandmother    Heart attack Brother 56       X2   Heart attack Sister 90       Deceased   Healthy Sister        x2   Healthy Son        x2   Colon cancer Neg Hx    Esophageal cancer Neg Hx    Rectal cancer Neg Hx    Stomach cancer Neg Hx      Review of Systems  Positive ROS: As above  All other systems have been reviewed and were otherwise negative with the exception of those mentioned in the HPI and as above.  Objective: Vital signs in last 24 hours: Temp:  [97.6 F (36.4 C)-98.1 F (36.7 C)] 97.7 F (36.5 C) (01/09 1213) Pulse Rate:  [50-55] 51 (01/09 1213) Resp:  [15-19] 19 (01/09 1213) BP: (142-165)/(72-83) 165/83 (01/09 1213) SpO2:  [93 %-98 %] 98 % (01/09 1213) Weight:  [74.8 kg] 74.8 kg (01/09 1213)  General Appearance: Alert, cooperative, no distress, appears stated age Head: Normocephalic, without obvious abnormality, atraumatic Eyes: PERRL, conjunctiva/corneas clear, EOM's intact    Neck: Supple, symmetrical, trachea midline Lungs:  respirations unlabored Heart: Regular rate and rhythm Abdomen: Soft, non-tender Extremities: Extremities normal, atraumatic, no cyanosis or edema Pulses: 2+ and symmetric all extremities Skin: Skin color, texture, turgor normal, no rashes or lesions  NEUROLOGIC:   Mental status: Alert and oriented x4,  no aphasia, good attention span, fund of knowledge, and memory appear to be appropriate Motor Exam - grossly normal Sensory Exam - grossly normal Reflexes: Trace Coordination - grossly normal Gait -not tested Balance -not tested Cranial Nerves: I: smell Not tested  II: visual acuity  OS: nl     OD: nl  II: visual fields Full to confrontation  II: pupils Equal, round, reactive to  light  III,VII: ptosis None  III,IV,VI: extraocular muscles  Full ROM  V: mastication Normal  V: facial light touch sensation  Normal  V,VII: corneal reflex  Present  VII: facial muscle function - upper  Normal  VII: facial muscle function - lower Normal  VIII: hearing Not tested  IX: soft palate elevation  Normal  IX,X: gag reflex Present  XI: trapezius strength  5/5  XI: sternocleidomastoid strength 5/5  XI: neck flexion strength  5/5  XII: tongue strength  Normal    Data Review Lab Results  Component Value Date   WBC 6.8 07/24/2024   HGB 12.1 (L) 07/24/2024   HCT 36.8 (L) 07/24/2024   MCV 100.0 07/24/2024   PLT 148 (L) 07/24/2024   Lab Results  Component Value Date   NA 139 07/24/2024   K 4.2 07/24/2024   CL 102 07/24/2024   CO2 23 07/24/2024   BUN 18 07/24/2024   CREATININE 0.78 07/24/2024   GLUCOSE 112 (H) 07/24/2024   Lab Results  Component Value Date   INR 1.2 07/24/2024    Assessment/Plan:  Estimated body mass index is 23.01 kg/m as calculated from the following:   Height as of this encounter: 5' 11 (1.803 m).   Weight as of this encounter: 74.8 kg. Patient admitted for multiple brain metastasis.  We recommend removal of the large right cerebellar lesion.  He understands the risk include but are not limited to bleeding, infection, CSF leak, need for further surgery, lack of complete resection of the lesion, stroke, vascular injury, brainstem or cranial nerve injury, lack of relief of symptoms, worsening symptoms, hydrocephalus, and anesthesia risk including DVT pneumonia MI and death.  He understands his alternatives.  He agrees to proceed.  I explained the condition and procedure to the patient and answered any questions.  Patient wishes to proceed with procedure as planned. Understands risks/ benefits and typical outcomes of procedure.   Charles Grimes 07/25/2024 1:02  PM      [1]  Allergies Allergen Reactions   Ibuprofen     GI Upset   Isosorbide  Nitrate Other (See Comments)    CONTINUOUS HEADACHE    Oxycodone  Itching    Reaction was to straight oxycodone  15 mg tablets - no reaction to percocet   Penicillins Hives and Rash    Has patient had a PCN reaction causing immediate rash, facial/tongue/throat swelling, SOB or lightheadedness with hypotension: Yes Has patient had a PCN reaction causing severe rash involving mucus membranes or skin necrosis: No Has patient had a PCN reaction that required hospitalization No Has patient had a PCN reaction occurring within the last 10 years: No If all of the above answers are NO, then may proceed with Cephalosporin use.

## 2024-07-25 NOTE — Anesthesia Procedure Notes (Signed)
 Procedure Name: Intubation Date/Time: 07/25/2024 2:03 PM  Performed by: Alen Motto D, CRNAPre-anesthesia Checklist: Patient identified, Emergency Drugs available, Suction available and Patient being monitored Patient Re-evaluated:Patient Re-evaluated prior to induction Oxygen Delivery Method: Circle System Utilized Preoxygenation: Pre-oxygenation with 100% oxygen Induction Type: IV induction Ventilation: Mask ventilation without difficulty Laryngoscope Size: Mac and 4 Grade View: Grade I Tube type: Oral Tube size: 7.5 mm Number of attempts: 1 Airway Equipment and Method: Stylet and Oral airway Placement Confirmation: ETT inserted through vocal cords under direct vision, positive ETCO2 and breath sounds checked- equal and bilateral Secured at: 22 cm Tube secured with: Tape Dental Injury: Teeth and Oropharynx as per pre-operative assessment

## 2024-07-25 NOTE — Progress Notes (Signed)
 Patient ID: Charles Grimes, male   DOB: 09-20-1955, 69 y.o.   MRN: [id number] Will plan for surgery today, please arrange transfer to [facility] for surgery this afternoon.

## 2024-07-25 NOTE — Progress Notes (Signed)
 eLink Physician-Brief Progress Note Patient Name: Charles Grimes DOB: 1956/03/05 MRN: [id number]   Date of Service  07/25/2024  HPI/Events of Note  Patient with a history of lung cancer s/p chemo-radiation therapy followed by immunotherapy who presented with suspected intra-cranial metastasis s/p sub-occipital craniotomy to resect a cerebellar mass.  eICU Interventions  New Patient Evaluation.        Laszlo Ellerby U Freeman Borba 07/25/2024, 8:03 PM

## 2024-07-25 NOTE — Consult Note (Signed)
 "  NAME:  Charles Grimes, MRN:  [id number], DOB:  Sep 07, 1955, LOS: 2 ADMISSION DATE:  07/23/2024, CONSULTATION DATE:  1/9 REFERRING MD:  Dr Caleen , CHIEF COMPLAINT:  brain mass   History of Present Illness:  69 year old male with past medical history as below, which is significant for coronary artery disease, hypertension, hyperlipidemia, nonischemic cardiomyopathy, sleep apnea, and adenocarcinoma of the lung with since completed chemoradiation and is currently being managed on immunotherapy.  He presented to the emergency department with complaints of headache, nausea and vomiting, and gait disturbance.  CT head in the emergency department showed a new mass in the right cerebellum with surrounding edema and mass effect on the fourth ventricle.  There was also a mass found in the left occipital lobe and anterior right frontal lobe concerning for intracranial metastatic disease.  Neurosurgery and radiation oncology were consulted with plans for surgical resection of the cerebellar lesion followed by stereo static radiosurgery.  He was transferred to [facility] 1/9 and underwent right suboccipital craniectomy for resection of the cerebellar mets.  Postoperatively he was transferred to the neuro ICU for ongoing care.  PCCM was consulted.   Pertinent  Medical History   has a past medical history of Anginal pain, Anxiety, Arthritis, Coronary artery disease, non-occlusive (08/2013), Depression, Dyslipidemia, goal LDL below 100, Essential hypertension, Family history of premature CAD, GERD (gastroesophageal reflux disease), H/O skin disorder, Nonischemic cardiomyopathy (HCC) (05/2013), Obesity (BMI 30.0-34.9), OSA (obstructive sleep apnea) (06/21/2017), and Sleep apnea.   Significant Hospital Events: Including procedures, antibiotic start and stop dates in addition to other pertinent events   1/7 presented for headache and imbalance. Multiple brain lesions. Admitted 1/9 transferred to [facility] for resection of  cerebellar lesion  Interim History / Subjective:    Objective    Blood pressure (!) 150/89, pulse 74, temperature 97.8 F (36.6 C), resp. rate 20, height 5' 11 (1.803 m), weight 74.8 kg, SpO2 100%.        Intake/Output Summary (Last 24 hours) at 07/25/2024 1734 Last data filed at 07/25/2024 1623 Gross per 24 hour  Intake 1075 ml  Output 200 ml  Net 875 ml   Filed Weights   07/25/24 1213  Weight: 74.8 kg    Examination: General: Adult male in NAD HENT: Surgical dressing in place R posterior skull.  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 He had previously completed chemoradiation and was on immunotherapy. Now with 3 brain mets most notably one to the right cerebellum s/p resection 1/9.  - Admit to neuro ICU - Management per neurosurgery  - Decadron  - Frequent neuro checks - Radiation oncology following with plans for SRS once recovered - Hydrocodone  , morphine   HTN - holding home lisinopril  until taking PO - PRN hydralazine  - Keep SBP < 160 mmHg  CAD - holding ASA perioperatively - Statin when taking PO  COPD without acute exacerbation - Needs home med rec, unclear if he is on Breztri  or nebs at home.  - Will given triple neb therapy while inpatient.    Labs   CBC: Recent Labs  Lab 07/20/24 2202 07/23/24 1420 07/24/24 0512  WBC 4.5 8.4 6.8  NEUTROABS 3.5  --   --   HGB 12.3* 12.3* 12.1*  HCT 37.6* 38.4* 36.8*  MCV 100.5* 101.9* 100.0  PLT 150 152 148*    Basic Metabolic Panel: Recent Labs  Lab 07/20/24 2202 07/23/24 1420 07/24/24 0512  NA 139 142 139  K 4.4 4.0 4.2  CL 105 105 102  CO2 23 23 23   GLUCOSE 90 99 112*  BUN 19 19 18   CREATININE 0.82 0.78 0.78  CALCIUM  9.0 9.5 9.1    GFR: Estimated Creatinine Clearance: 93.5 mL/min (by C-G formula based on SCr of 0.78 mg/dL). Recent Labs  Lab 07/20/24 2202 07/23/24 1420 07/24/24 0512  WBC 4.5 8.4 6.8    Liver Function Tests: Recent Labs  Lab 07/20/24 2202 07/23/24 1420 07/24/24 0512  AST 27 24 19   ALT 12 12 14   ALKPHOS 124 118 113  BILITOT 0.6 0.8 0.7  PROT 7.4 7.8 7.2  ALBUMIN 3.7 4.1 3.9   Recent Labs  Lab 07/20/24 2202 07/23/24 1420  LIPASE 12 12   No results for input(s): AMMONIA in the last 168 hours.  ABG    Component Value Date/Time   HCO3 21.1 04/27/2024 1941   ACIDBASEDEF 3.0 (H) 04/27/2024 1941   O2SAT 98.5 04/27/2024 1941     Coagulation Profile: Recent Labs  Lab 07/24/24 0512  INR 1.2    Cardiac Enzymes: No results for input(s): CKTOTAL, CKMB, CKMBINDEX, TROPONINI in the last 168 hours.  HbA1C: Hgb A1c MFr Bld  Date/Time Value Ref Range Status  10/25/2015 08:59 AM 5.9 4.6 - 6.5 % Final    Comment:    Glycemic Control Guidelines for People with Diabetes:Non Diabetic:  <6%Goal of Therapy: <7%Additional Action Suggested:  >8%   06/10/2014 08:20 AM 6.0 4.6 - 6.5 % Final    Comment:    Glycemic Control Guidelines for People with Diabetes:Non Diabetic:  <6%Goal of Therapy: <7%Additional Action Suggested:  >8%     CBG: Recent Labs  Lab 07/23/24 1607 07/23/24 1728  GLUCAP 98 88    Review of Systems:   Patient is encephalopathic and/or intubated; therefore, history has been obtained from chart review.    Past Medical History:  He,  has a past medical history of Anginal pain, Anxiety, Arthritis, Coronary artery disease, non-occlusive (08/2013), Depression, Dyslipidemia, goal LDL below 100, Essential hypertension, Family history of premature CAD, GERD (gastroesophageal reflux disease), H/O skin disorder, Nonischemic cardiomyopathy (HCC) (05/2013), Obesity (BMI 30.0-34.9), OSA (obstructive sleep apnea) (06/21/2017), and Sleep apnea.   Surgical History:    Past Surgical History:  Procedure Laterality Date   COLONOSCOPY  2015   COLONOSCOPY WITH PROPOFOL   01/02/2017   Dr.Pyrtle   IR IMAGING GUIDED PORT INSERTION  04/02/2024   LEFT HEART CATHETERIZATION WITH CORONARY ANGIOGRAM  08/26/2013   Mild to moderate disease with 40-50% stenosis and OM1. Otherwise no significant CAD --  Surgeon: Alm LELON Clay, MD;  Location[facility] CATH LAB;  Service: Cardiovascular;;   Lower extremity arterial Dopplers  06/11/2013   No occlusive disease   LUMBAR LAMINECTOMY/DECOMPRESSION MICRODISCECTOMY Right 10/07/2014   Procedure: LUMBAR LAMINECTOMY/DECOMPRESSION MICRODISCECTOMY 1 LEVEL;  Surgeon: Arley Helling, MD;  Location: [facility] NEURO ORS;  Service: Neurosurgery;  Laterality: Right;  Right L3-L4 Microdiscectomy   NM MYOVIEW  LTD  06/03/2013   Low risk study with no ischemia. EF roughly 44% with no regional wall motion abnormalities noted   PFTs  06/16/2013   Normal Volumes &  Spirometry; Moderately reduced DLCO   POLYPECTOMY     SHOULDER HEMI-ARTHROPLASTY Right 05/28/2015   Procedure: RIGHT SHOULDER HEMI-ARTHROPLASTY CTA HEAD AND SUBSCAP REPAIR ;  Surgeon: Marcey  Kay, MD;  Location: [facility] OR;  Service: Orthopedics;  Laterality: Right;   TRANSTHORACIC ECHOCARDIOGRAM  06/11/2013   Mildly reduced EF: 45-50%. Mild anterior hypokinesis with incoordinate septal motion. No evidence of pulmonary hypertension   VIDEO BRONCHOSCOPY WITH ENDOBRONCHIAL ULTRASOUND Left 02/19/2024   Procedure: BRONCHOSCOPY, WITH EBUS;  Surgeon: Shelah Lamar RAMAN, MD;  Location: [facility] ENDOSCOPY;  Service: Pulmonary;  Laterality: Left;     Social History:   reports that he has quit smoking. His smoking use included cigarettes. He has a 42 pack-year smoking history. He has never used smokeless tobacco. He reports that he does not currently use alcohol. He reports that he does not use drugs.   Family History:  His family history includes Atrial fibrillation in his mother; COPD in his mother; Colonic polyp in his  mother; Diabetes in his paternal grandmother; Factor V Leiden deficiency in his father; Healthy in his sister and son; Heart attack (age of onset: 54) in his maternal grandmother; Heart attack (age of onset: 63) in his sister; Heart attack (age of onset: 69) in his brother; Heart failure in his paternal grandmother; Hypertension in his father and mother; Lung cancer in his father. There is no history of Colon cancer, Esophageal cancer, Rectal cancer, or Stomach cancer.   Allergies Allergies[1]   Home Medications  Prior to Admission medications  Medication Sig Start Date End Date Taking? Authorizing Provider  arformoterol  (BROVANA ) 15 MCG/2ML NEBU Take 2 mLs (15 mcg total) by nebulization 2 (two) times daily. 05/01/24   Ruthell Lauraine FALCON, NP  budesonide  (PULMICORT ) 0.25 MG/2ML nebulizer solution Take 2 mLs (0.25 mg total) by nebulization 2 (two) times daily. 05/01/24 05/01/25  Ruthell Lauraine FALCON, NP  ipratropium (ATROVENT ) 0.02 % nebulizer solution Take 2.5 mLs (0.5 mg total) by nebulization 4 (four) times daily. 05/01/24   Ruthell Lauraine FALCON, NP  albuterol  (PROVENTIL ) (2.5 MG/3ML) 0.083% nebulizer solution Take 3 mLs (2.5 mg total) by nebulization every 6 (six) hours as needed for wheezing or shortness of breath. 04/27/24   Melvenia Motto, MD  albuterol  (VENTOLIN  HFA) 108 (90 Base) MCG/ACT inhaler Inhale 2 puffs into the lungs every 6 (six) hours as needed for wheezing or shortness of breath. 02/25/24   Ruthell Lauraine FALCON, NP  ALPRAZolam  (XANAX ) 0.5 MG tablet Take 1 tablet (0.5 mg total) by mouth at bedtime as needed for anxiety. 10/24/23   [name], DO  alum & mag hydroxide-simeth (MAALOX MAX) 400-400-40 MG/5ML suspension Take 15 mLs by mouth every 6 (six) hours as needed for indigestion. 06/01/24   Kingsley, Victoria K, DO  aspirin  EC 81 MG tablet Take 81 mg by mouth daily.    [provider]  atorvastatin  (LIPITOR) 10 MG tablet Take 1 tablet (10 mg total) by mouth daily. 10/24/23   [name], DO  budesonide -glycopyrrolate -formoterol (BREZTRI  AEROSPHERE) 160-9-4.8 MCG/ACT AERO inhaler Inhale 2 puffs into the lungs in the morning and at bedtime. 02/25/24   Ruthell Lauraine FALCON, NP  budesonide -glycopyrrolate -formoterol (BREZTRI  AEROSPHERE) 160-9-4.8 MCG/ACT AERO inhaler Inhale 2 puffs into the lungs in the morning and at bedtime. 02/25/24   Ruthell Lauraine FALCON, NP  clotrimazole  (MYCELEX ) 10 MG troche Take 1 tablet (10 mg total) by mouth 3 (three) times daily. 02/25/24   Ruthell Lauraine FALCON, NP  colchicine  0.6 MG tablet Take 1 tablet (0.6 mg total) by mouth daily as needed (gout flares). 02/19/24   Shelah Lamar RAMAN, MD  docusate sodium  (COLACE) 100 MG capsule Take 1 capsule (100 mg total)  by mouth every 12 (twelve) hours. 04/14/24   Mannie Pac T, DO  HYDROcodone -acetaminophen  (NORCO/VICODIN) 5-325 MG tablet Take 1 tablet by mouth every 6 (six) hours as needed for moderate pain (pain score 4-6). 05/26/24   Shannon Agent, MD  lidocaine  (XYLOCAINE ) 2 % solution Use as directed 15 mLs in the mouth or throat every 6 (six) hours as needed for mouth pain. 04/28/24   Sherrod Sherrod, MD  lidocaine  (XYLOCAINE ) 2 % solution Use as directed 15 mLs in the mouth or throat every 4 (four) hours as needed for mouth pain. 06/01/24   Kingsley, Victoria K, DO  lisinopril  (ZESTRIL ) 10 MG tablet Take 1 tablet (10 mg total) by mouth daily. 01/14/24   [name], DO  methylPREDNISolone  (MEDROL  DOSEPAK) 4 MG TBPK tablet Use as instructed 05/12/24   Sherrod Sherrod, MD  metoCLOPramide  (REGLAN ) 10 MG tablet Take 1 tablet (10 mg total) by mouth every 8 (eight) hours as needed for nausea. 04/27/24   Melvenia Motto, MD  Multiple Vitamins-Minerals (CENTRUM SILVER 50+MEN) TABS Take 1 tablet by mouth daily.    [provider]  nitroGLYCERIN  (NITROSTAT ) 0.4 MG SL tablet Place 1 tablet (0.4 mg total) under the tongue every 5 (five) minutes as needed for chest pain. 02/08/21   [name], DO  NON  FORMULARY Pt uses a c-pap nightly    [provider]  omeprazole  (PRILOSEC ) 20 MG capsule Take 1 capsule (20 mg total) by mouth daily. 01/14/24   [name], DO  ondansetron  (ZOFRAN -ODT) 4 MG disintegrating tablet Take 1 tablet (4 mg total) by mouth every 8 (eight) hours as needed. 07/21/24   Griselda Norris, MD  polyethylene glycol powder (MIRALAX ) 17 GM/SCOOP powder Take 17 g by mouth daily. Dissolve 1 capful (17g) in 4-8 ounces of liquid and take by mouth daily. 04/14/24   Mannie Pac T, DO  sertraline  (ZOLOFT ) 50 MG tablet Take 2 tabs in the morning and 1 tab in the evening. 01/14/24   [name], DO  Spacer/Aero-Holding Chambers (AEROCHAMBER PLUS) Device Per patient request to help with inhaler management and effectiveness. 04/28/24   Ruthell Lauraine FALCON, NP  sucralfate  (CARAFATE ) 1 g tablet Take 1 tablet (1 g total) by mouth 4 (four) times daily. Dissolve each tablet in 15 cc water before use. 06/01/24   Kingsley, Victoria K, DO  tiZANidine  (ZANAFLEX ) 4 MG tablet Take 1 tablet (4 mg total) by mouth at bedtime as needed for muscle spasms. 01/14/24   [name], DO  triamcinolone  cream (KENALOG ) 0.1 % Apply 1 Application topically 2 (two) times daily as needed (itch). 11/12/23   [name], DO     Critical care time: 39 minutes     [place], AGACNP-BC [facility]  See Amion for personal pager PCCM on call pager [phone] until 7pm. Please call Elink 7p-7a. [phone]  07/25/2024 7:06 PM              [1]  Allergies Allergen Reactions   Ibuprofen     GI Upset   Isosorbide  Nitrate Other (See Comments)    CONTINUOUS HEADACHE    Oxycodone  Itching    Reaction was to straight oxycodone  15 mg tablets - no reaction to percocet   Penicillins Hives and Rash    Has patient had a PCN reaction causing immediate rash, facial/tongue/throat swelling, SOB or lightheadedness with hypotension: Yes Has  patient had a PCN reaction causing severe rash involving mucus membranes or  skin necrosis: No Has patient had a PCN reaction that required hospitalization No Has patient had a PCN reaction occurring within the last 10 years: No If all of the above answers are NO, then may proceed with Cephalosporin use.   "

## 2024-07-25 NOTE — Transfer of Care (Signed)
 Immediate Anesthesia Transfer of Care Note  Patient: Charles Grimes  Procedure(s) Performed: CRANIOTOMY TUMOR EXCISION (Right: Head)  Patient Location: PACU  Anesthesia Type:General  Level of Consciousness: awake, alert , and oriented  Airway & Oxygen Therapy: Patient Spontanous Breathing and Patient connected to nasal cannula oxygen  Post-op Assessment: Report given to RN and Post -op Vital signs reviewed and stable  Post vital signs: Reviewed and stable  Last Vitals:  Vitals Value Taken Time  BP 131/77 07/25/24 16:30  Temp    Pulse 84 07/25/24 16:34  Resp 14 07/25/24 16:34  SpO2 100 % 07/25/24 16:34  Vitals shown include unfiled device data.  Last Pain:  Vitals:   07/25/24 1241  TempSrc:   PainSc: 0-No pain         Complications: There were no known notable events for this encounter.

## 2024-07-25 NOTE — Progress Notes (Signed)
 First attempt to receive report from Zoe, [place] [id number].

## 2024-07-25 NOTE — Plan of Care (Signed)
  Problem: Clinical Measurements: Goal: Will remain free from infection Outcome: Progressing   Problem: Nutrition: Goal: Adequate nutrition will be maintained Outcome: Progressing   Problem: Coping: Goal: Level of anxiety will decrease Outcome: Progressing   Problem: Elimination: Goal: Will not experience complications related to urinary retention Outcome: Progressing   Problem: Safety: Goal: Ability to remain free from injury will improve Outcome: Progressing

## 2024-07-25 NOTE — Anesthesia Postprocedure Evaluation (Signed)
"   Anesthesia Post Note  Patient: ALEJANDRA BARNA  Procedure(s) Performed: CRANIOTOMY TUMOR EXCISION (Right: Head)     Patient location during evaluation: PACU Anesthesia Type: General Level of consciousness: awake and alert Pain management: pain level controlled Vital Signs Assessment: post-procedure vital signs reviewed and stable Respiratory status: spontaneous breathing, nonlabored ventilation, respiratory function stable and patient connected to nasal cannula oxygen Cardiovascular status: blood pressure returned to baseline and stable Postop Assessment: no apparent nausea or vomiting Anesthetic complications: no   There were no known notable events for this encounter.  Last Vitals:  Vitals:   07/25/24 0400 07/25/24 1213  BP: (!) 151/74 (!) 165/83  Pulse: (!) 50 (!) 51  Resp: 15 19  Temp: 36.4 C 36.5 C  SpO2: 93% 98%    Last Pain:  Vitals:   07/25/24 1241  TempSrc:   PainSc: 0-No pain                 Annabella Elford      "

## 2024-07-25 NOTE — Anesthesia Preprocedure Evaluation (Addendum)
"                                    Anesthesia Evaluation  Patient identified by MRN, date of birth, ID band Patient awake    Reviewed: Allergy & Precautions, NPO status , Patient's Chart, lab work & pertinent test results  Airway Mallampati: I  TM Distance: >3 FB Neck ROM: Full    Dental  (+) Edentulous Upper, Dental Advisory Given   Pulmonary sleep apnea , COPD,  COPD inhaler, Patient abstained from smoking., former smoker    + decreased breath sounds      Cardiovascular hypertension, Pt. on medications + CAD   Rhythm:Regular Rate:Normal  Echo:  1. Left ventricular ejection fraction, by estimation, is 55 to 60%. The  left ventricle has normal function. The left ventricle has no regional  wall motion abnormalities. Left ventricular diastolic parameters were  normal.   2. Right ventricular systolic function is normal. The right ventricular  size is normal.   3. The mitral valve is normal in structure. Trivial mitral valve  regurgitation. No evidence of mitral stenosis.   4. The aortic valve is normal in structure. Aortic valve regurgitation is  trivial. No aortic stenosis is present.   5. The inferior vena cava is normal in size with greater than 50%  respiratory variability, suggesting right atrial pressure of 3 mmHg.     Neuro/Psych  PSYCHIATRIC DISORDERS Anxiety Depression    negative neurological ROS     GI/Hepatic Neg liver ROS,GERD  Medicated,,  Endo/Other  negative endocrine ROS    Renal/GU negative Renal ROS     Musculoskeletal  (+) Arthritis ,    Abdominal   Peds  Hematology negative hematology ROS (+)   Anesthesia Other Findings   Reproductive/Obstetrics                              Anesthesia Physical Anesthesia Plan  ASA: 3  Anesthesia Plan: General   Post-op Pain Management: Ofirmev  IV (intra-op)*   Induction: Intravenous  PONV Risk Score and Plan: 3 and Ondansetron  and Treatment may vary due to  age or medical condition  Airway Management Planned: Oral ETT  Additional Equipment: Arterial line  Intra-op Plan:   Post-operative Plan: Possible Post-op intubation/ventilation  Informed Consent: I have reviewed the patients History and Physical, chart, labs and discussed the procedure including the risks, benefits and alternatives for the proposed anesthesia with the patient or authorized representative who has indicated his/her understanding and acceptance.       Plan Discussed with: CRNA  Anesthesia Plan Comments:          Anesthesia Quick Evaluation  "

## 2024-07-26 ENCOUNTER — Inpatient Hospital Stay (HOSPITAL_COMMUNITY)

## 2024-07-26 DIAGNOSIS — I2510 Atherosclerotic heart disease of native coronary artery without angina pectoris: Secondary | ICD-10-CM | POA: Diagnosis not present

## 2024-07-26 DIAGNOSIS — C3490 Malignant neoplasm of unspecified part of unspecified bronchus or lung: Secondary | ICD-10-CM | POA: Diagnosis not present

## 2024-07-26 DIAGNOSIS — I10 Essential (primary) hypertension: Secondary | ICD-10-CM | POA: Diagnosis not present

## 2024-07-26 DIAGNOSIS — C7931 Secondary malignant neoplasm of brain: Secondary | ICD-10-CM | POA: Diagnosis not present

## 2024-07-26 DIAGNOSIS — J449 Chronic obstructive pulmonary disease, unspecified: Secondary | ICD-10-CM

## 2024-07-26 MED ORDER — ALPRAZOLAM 0.5 MG PO TABS
0.5000 mg | ORAL_TABLET | Freq: Every evening | ORAL | Status: DC | PRN
  Administered 2024-07-28: 0.5 mg via ORAL
  Filled 2024-07-26: qty 1

## 2024-07-26 MED ORDER — GADOBUTROL 1 MMOL/ML IV SOLN
8.0000 mL | Freq: Once | INTRAVENOUS | Status: AC | PRN
  Administered 2024-07-26: 8 mL via INTRAVENOUS

## 2024-07-26 MED ORDER — ATORVASTATIN CALCIUM 10 MG PO TABS
10.0000 mg | ORAL_TABLET | Freq: Every day | ORAL | Status: DC
  Administered 2024-07-26 – 2024-07-29 (×4): 10 mg via ORAL
  Filled 2024-07-26 (×4): qty 1

## 2024-07-26 MED ORDER — LISINOPRIL 5 MG PO TABS
10.0000 mg | ORAL_TABLET | Freq: Every day | ORAL | Status: DC
  Administered 2024-07-26 – 2024-07-29 (×4): 10 mg via ORAL
  Filled 2024-07-26: qty 1
  Filled 2024-07-26: qty 2
  Filled 2024-07-26: qty 1
  Filled 2024-07-26: qty 2

## 2024-07-26 NOTE — Progress Notes (Signed)
 eLink Physician-Brief Progress Note Patient Name: Charles Grimes DOB: 10-Jul-1956 MRN: [id number]   Date of Service  07/26/2024  HPI/Events of Note  Patient passed bedside swallow evaluation.  eICU Interventions  Regular diet ordered.        Charles Grimes U Iliani Vejar 07/26/2024, 3:41 AM

## 2024-07-26 NOTE — Progress Notes (Signed)
 "  NAME:  Charles Grimes, MRN:  [id number], DOB:  1955/10/29, LOS: 3 ADMISSION DATE:  07/23/2024, CONSULTATION DATE:  1/9 REFERRING MD:  Dr Caleen , CHIEF COMPLAINT:  brain mass   History of Present Illness:  69 year old male with past medical history as below, which is significant for coronary artery disease, hypertension, hyperlipidemia, nonischemic cardiomyopathy, sleep apnea, and adenocarcinoma of the lung with since completed chemoradiation and is currently being managed on immunotherapy.  He presented to the emergency department with complaints of headache, nausea and vomiting, and gait disturbance.  CT head in the emergency department showed a new mass in the right cerebellum with surrounding edema and mass effect on the fourth ventricle.  There was also a mass found in the left occipital lobe and anterior right frontal lobe concerning for intracranial metastatic disease.  Neurosurgery and radiation oncology were consulted with plans for surgical resection of the cerebellar lesion followed by stereo static radiosurgery.  He was transferred to [facility] 1/9 and underwent right suboccipital craniectomy for resection of the cerebellar mets.  Postoperatively he was transferred to the neuro ICU for ongoing care.  PCCM was consulted.   Pertinent  Medical History   has a past medical history of Anginal pain, Anxiety, Arthritis, Coronary artery disease, non-occlusive (08/2013), Depression, Dyslipidemia, goal LDL below 100, Essential hypertension, Family history of premature CAD, GERD (gastroesophageal reflux disease), H/O skin disorder, Nonischemic cardiomyopathy (HCC) (05/2013), Obesity (BMI 30.0-34.9), OSA (obstructive sleep apnea) (06/21/2017), and Sleep apnea.   Significant Hospital Events: Including procedures, antibiotic start and stop dates in addition to other pertinent events   1/7 presented for headache and imbalance. Multiple brain lesions. Admitted 1/9 transferred to [facility] for resection of  cerebellar lesion  Interim History / Subjective:  NAEON, ambulated with PT, he is hungry  Objective    Blood pressure (!) 143/82, pulse (!) 59, temperature (!) 97.5 F (36.4 C), temperature source Axillary, resp. rate 14, height 5' 11 (1.803 m), weight 74.8 kg, SpO2 93%.    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 He had previously completed chemoradiation and was on immunotherapy. Now with 3 brain mets most notably one to the right cerebellum s/p resection 1/9.  - Management per neurosurgery  - Decadron  - Frequent neuro checks - Radiation oncology following with plans for SRS once recovered - Hydrocodone  , morphine  for pain control  HTN - Resume home lisinopril  1/10 - PRN hydralazine  - Keep SBP < 160 mmHg  CAD - holding ASA perioperatively - Statin resumd 1/10  COPD without acute exacerbation - LAMA LABA ICS nebs   Labs   CBC: Recent Labs  Lab 07/20/24 2202 07/23/24 1420 07/24/24 0512  WBC 4.5 8.4 6.8  NEUTROABS 3.5  --   --   HGB 12.3* 12.3* 12.1*  HCT 37.6* 38.4* 36.8*  MCV 100.5* 101.9* 100.0  PLT 150 152 148*    Basic Metabolic Panel: Recent Labs  Lab 07/20/24  2202 07/23/24 1420 07/24/24 0512  NA 139 142 139  K 4.4 4.0 4.2  CL 105 105 102  CO2 23 23 23   GLUCOSE 90 99 112*  BUN 19 19 18   CREATININE 0.82 0.78 0.78  CALCIUM  9.0 9.5 9.1   GFR: Estimated Creatinine Clearance: 93.5 mL/min (by C-G formula based on SCr of 0.78  mg/dL). Recent Labs  Lab 07/20/24 2202 07/23/24 1420 07/24/24 0512  WBC 4.5 8.4 6.8    Liver Function Tests: Recent Labs  Lab 07/20/24 2202 07/23/24 1420 07/24/24 0512  AST 27 24 19   ALT 12 12 14   ALKPHOS 124 118 113  BILITOT 0.6 0.8 0.7  PROT 7.4 7.8 7.2  ALBUMIN 3.7 4.1 3.9   Recent Labs  Lab 07/20/24 2202 07/23/24 1420  LIPASE 12 12   No results for input(s): AMMONIA in the last 168 hours.  ABG    Component Value Date/Time   HCO3 21.1 04/27/2024 1941   ACIDBASEDEF 3.0 (H) 04/27/2024 1941   O2SAT 98.5 04/27/2024 1941     Coagulation Profile: Recent Labs  Lab 07/24/24 0512  INR 1.2    Cardiac Enzymes: No results for input(s): CKTOTAL, CKMB, CKMBINDEX, TROPONINI in the last 168 hours.  HbA1C: Hgb A1c MFr Bld  Date/Time Value Ref Range Status  10/25/2015 08:59 AM 5.9 4.6 - 6.5 % Final    Comment:    Glycemic Control Guidelines for People with Diabetes:Non Diabetic:  <6%Goal of Therapy: <7%Additional Action Suggested:  >8%   06/10/2014 08:20 AM 6.0 4.6 - 6.5 % Final    Comment:    Glycemic Control Guidelines for People with Diabetes:Non Diabetic:  <6%Goal of Therapy: <7%Additional Action Suggested:  >8%     CBG: Recent Labs  Lab 07/23/24 1607 07/23/24 1728  GLUCAP 98 88    Review of Systems:   Patient is encephalopathic and/or intubated; therefore, history has been obtained from chart review.    Past Medical History:  He,  has a past medical history of Anginal pain, Anxiety, Arthritis, Coronary artery disease, non-occlusive (08/2013), Depression, Dyslipidemia, goal LDL below 100, Essential hypertension, Family history of premature CAD, GERD (gastroesophageal reflux disease), H/O skin disorder, Nonischemic cardiomyopathy (HCC) (05/2013), Obesity (BMI 30.0-34.9), OSA (obstructive sleep apnea) (06/21/2017), and Sleep apnea.   Surgical History:   Past Surgical History:  Procedure Laterality Date   COLONOSCOPY  2015   COLONOSCOPY WITH  PROPOFOL   01/02/2017   Dr.Pyrtle   IR IMAGING GUIDED PORT INSERTION  04/02/2024   LEFT HEART CATHETERIZATION WITH CORONARY ANGIOGRAM  08/26/2013   Mild to moderate disease with 40-50% stenosis and OM1. Otherwise no significant CAD --  Surgeon: Alm LELON Clay, MD;  Location: [facility] C[facility] LAB;  Service: Cardiovascular;;   Lower extremity arterial Dopplers  06/11/2013   No occlusive disease   LUMBAR LAMINECTOMY/DECOMPRESSION MICRODISCECTOMY Right 10/07/2014   Procedure: LUMBAR LAMINECTOMY/DECOMPRESSION MICRODISCECTOMY 1 LEVEL;  Surgeon: Arley Helling, MD;  Location: [facility] NEURO ORS;  Service: Neurosurgery;  Laterality: Right;  Right L3-L4 Microdiscectomy   NM MYOVIEW  LTD  06/03/2013   Low risk study with no ischemia. EF roughly 44% with no regional wall motion abnormalities noted   PFTs  06/16/2013   Normal Volumes &  Spirometry; Moderately reduced DLCO   POLYPECTOMY     SHOULDER HEMI-ARTHROPLASTY Right 05/28/2015   Procedure: RIGHT SHOULDER HEMI-ARTHROPLASTY CTA HEAD AND SUBSCAP REPAIR ;  Surgeon: Marcey Her, MD;  Location: [facility] OR;  Service: Orthopedics;  Laterality: Right;   TRANSTHORACIC ECHOCARDIOGRAM  06/11/2013  Mildly reduced EF: 45-50%. Mild anterior hypokinesis with incoordinate septal motion. No evidence of pulmonary hypertension   VIDEO BRONCHOSCOPY WITH ENDOBRONCHIAL ULTRASOUND Left 02/19/2024   Procedure: BRONCHOSCOPY, WITH EBUS;  Surgeon: Shelah Lamar RAMAN, MD;  Location: [facility] ENDOSCOPY;  Service: Pulmonary;  Laterality: Left;     Social History:   reports that he has quit smoking. His smoking use included cigarettes. He has a 42 pack-year smoking history. He has never used smokeless tobacco. He reports that he does not currently use alcohol. He reports that he does not use drugs.   Family History:  His family history includes Atrial fibrillation in his mother; COPD in his mother; Colonic polyp in his mother; Diabetes in his paternal grandmother; Factor V Leiden deficiency in his father; Healthy  in his sister and son; Heart attack (age of onset: 64) in his maternal grandmother; Heart attack (age of onset: 83) in his sister; Heart attack (age of onset: 13) in his brother; Heart failure in his paternal grandmother; Hypertension in his father and mother; Lung cancer in his father. There is no history of Colon cancer, Esophageal cancer, Rectal cancer, or Stomach cancer.   Allergies Allergies[1]   Home Medications  Prior to Admission medications  Medication Sig Start Date End Date Taking? Authorizing Provider  arformoterol  (BROVANA ) 15 MCG/2ML NEBU Take 2 mLs (15 mcg total) by nebulization 2 (two) times daily. 05/01/24   Ruthell Lauraine FALCON, NP  budesonide  (PULMICORT ) 0.25 MG/2ML nebulizer solution Take 2 mLs (0.25 mg total) by nebulization 2 (two) times daily. 05/01/24 05/01/25  Ruthell Lauraine FALCON, NP  ipratropium (ATROVENT ) 0.02 % nebulizer solution Take 2.5 mLs (0.5 mg total) by nebulization 4 (four) times daily. 05/01/24   Ruthell Lauraine FALCON, NP  albuterol  (PROVENTIL ) (2.5 MG/3ML) 0.083% nebulizer solution Take 3 mLs (2.5 mg total) by nebulization every 6 (six) hours as needed for wheezing or shortness of breath. 04/27/24   Melvenia Motto, MD  albuterol  (VENTOLIN  HFA) 108 [phone] Base) MCG/ACT inhaler Inhale 2 puffs into the lungs every 6 (six) hours as needed for wheezing or shortness of breath. 02/25/24   Ruthell Lauraine FALCON, NP  ALPRAZolam  (XANAX ) 0.5 MG tablet Take 1 tablet (0.5 mg total) by mouth at bedtime as needed for anxiety. 10/24/23   [name], DO  alum & mag hydroxide-simeth (MAALOX MAX) 400-400-40 MG/5ML suspension Take 15 mLs by mouth every 6 (six) hours as needed for indigestion. 06/01/24   Kingsley, Victoria K, DO  aspirin  EC 81 MG tablet Take 81 mg by mouth daily.    [provider]  atorvastatin  (LIPITOR) 10 MG tablet Take 1 tablet (10 mg total) by mouth daily. 10/24/23   [name], DO  budesonide -glycopyrrolate -formoterol (BREZTRI  AEROSPHERE) 160-9-4.8 MCG/ACT AERO  inhaler Inhale 2 puffs into the lungs in the morning and at bedtime. 02/25/24   Ruthell Lauraine FALCON, NP  budesonide -glycopyrrolate -formoterol (BREZTRI  AEROSPHERE) 160-9-4.8 MCG/ACT AERO inhaler Inhale 2 puffs into the lungs in the morning and at bedtime. 02/25/24   Ruthell Lauraine FALCON, NP  clotrimazole  (MYCELEX ) 10 MG troche Take 1 tablet (10 mg total) by mouth 3 (three) times daily. 02/25/24   Ruthell Lauraine FALCON, NP  colchicine  0.6 MG tablet Take 1 tablet (0.6 mg total) by mouth daily as needed (gout flares). 02/19/24   Shelah Lamar RAMAN, MD  docusate sodium  (COLACE) 100 MG capsule Take 1 capsule (100 mg total) by mouth every 12 (twelve) hours. 04/14/24   Mannie Pac T, DO  HYDROcodone -acetaminophen  (NORCO/VICODIN) 5-325 MG tablet Take  1 tablet by mouth every 6 (six) hours as needed for moderate pain (pain score 4-6). 05/26/24   Shannon Agent, MD  lidocaine  (XYLOCAINE ) 2 % solution Use as directed 15 mLs in the mouth or throat every 6 (six) hours as needed for mouth pain. 04/28/24   Sherrod Sherrod, MD  lidocaine  (XYLOCAINE ) 2 % solution Use as directed 15 mLs in the mouth or throat every 4 (four) hours as needed for mouth pain. 06/01/24   Kingsley, Victoria K, DO  lisinopril  (ZESTRIL ) 10 MG tablet Take 1 tablet (10 mg total) by mouth daily. 01/14/24   [name], DO  methylPREDNISolone  (MEDROL  DOSEPAK) 4 MG TBPK tablet Use as instructed 05/12/24   Sherrod Sherrod, MD  metoCLOPramide  (REGLAN ) 10 MG tablet Take 1 tablet (10 mg total) by mouth every 8 (eight) hours as needed for nausea. 04/27/24   Melvenia Motto, MD  Multiple Vitamins-Minerals (CENTRUM SILVER 50+MEN) TABS Take 1 tablet by mouth daily.    [provider]  nitroGLYCERIN  (NITROSTAT ) 0.4 MG SL tablet Place 1 tablet (0.4 mg total) under the tongue every 5 (five) minutes as needed for chest pain. 02/08/21   [name], DO  NON FORMULARY Pt uses a c-pap nightly    [provider]  omeprazole  (PRILOSEC ) 20 MG capsule Take  1 capsule (20 mg total) by mouth daily. 01/14/24   [name], DO  ondansetron  (ZOFRAN -ODT) 4 MG disintegrating tablet Take 1 tablet (4 mg total) by mouth every 8 (eight) hours as needed. 07/21/24   Griselda Norris, MD  polyethylene glycol powder (MIRALAX ) 17 GM/SCOOP powder Take 17 g by mouth daily. Dissolve 1 capful (17g) in 4-8 ounces of liquid and take by mouth daily. 04/14/24   Mannie Fairy DASEN, DO  sertraline  (ZOLOFT ) 50 MG tablet Take 2 tabs in the morning and 1 tab in the evening. 01/14/24   [name], DO  Spacer/Aero-Holding Chambers (AEROCHAMBER PLUS) Device Per patient request to help with inhaler management and effectiveness. 04/28/24   Ruthell Lauraine FALCON, NP  sucralfate  (CARAFATE ) 1 g tablet Take 1 tablet (1 g total) by mouth 4 (four) times daily. Dissolve each tablet in 15 cc water before use. 06/01/24   Kingsley, Victoria K, DO  tiZANidine  (ZANAFLEX ) 4 MG tablet Take 1 tablet (4 mg total) by mouth at bedtime as needed for muscle spasms. 01/14/24   [name], DO  triamcinolone  cream (KENALOG ) 0.1 % Apply 1 Application topically 2 (two) times daily as needed (itch). 11/12/23   [name], DO     Critical care time: n/a     Donnice JONELLE Beals, MD [facility]  See Amion for contact info PCCM on call pager [phone] until 7pm. Please call Elink 7p-7a. [phone]  07/26/2024 12:02 PM               [1]  Allergies Allergen Reactions   Ibuprofen     GI Upset   Isosorbide  Nitrate Other (See Comments)    CONTINUOUS HEADACHE    Oxycodone  Itching    Reaction was to straight oxycodone  15 mg tablets - no reaction to percocet   Penicillins Hives and Rash    Has patient had a PCN reaction causing immediate rash, facial/tongue/throat swelling, SOB or lightheadedness with hypotension: Yes Has patient had a PCN reaction causing severe rash involving mucus membranes or skin necrosis: No Has patient had a  PCN reaction that required hospitalization No Has patient had a PCN reaction  occurring within the last 10 years: No If all of the above answers are NO, then may proceed with Cephalosporin use.   "

## 2024-07-26 NOTE — Progress Notes (Signed)
 Patient ID: Charles Grimes, male   DOB: July 07, 1956, 69 y.o.   MRN: [id number] He looks great clinically.  He is awake and alert.  He denies headache.  He is moving all extremities.  Dressing is dry.  I reviewed his MRI.  Subtotal resection of cerebellar metastasis.  Could consider repeat resection but I think probably SRS at this point would be reasonable.  He seems pleased with how he is doing this morning.  Will mobilize today.

## 2024-07-26 NOTE — Evaluation (Signed)
 Physical Therapy Evaluation Patient Details Name: Charles Grimes MRN: [id number] DOB: May 25, 1956 Today's Date: 07/26/2024  History of Present Illness  Pt is a 69 y/o male admitted 1/7 with brain masses, with symptom onset over a few days with pain and balance issues.  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  1/9   Right suboccipital craniectomy for resection of cerebellar metastasis  PMHx:CAD, HTN, NICM, OSA/sleep apnea on CPAP  Clinical Impression  Pt admitted with/for brain mets causing pain and balance issues, s/p resection.  Pt currently limited functionally needing CGA to light min assist due to the problems listed below.  (see problems list.)  Pt will benefit from PT to maximize function and safety to be able to get home safely with available assist.         If plan is discharge home, recommend the following:     Can travel by private vehicle        Equipment Recommendations None recommended by PT  Recommendations for Other Services       Functional Status Assessment Patient has had a recent decline in their functional status and demonstrates the ability to make significant improvements in function in a reasonable and predictable amount of time.     Precautions / Restrictions Precautions Precautions: Fall      Mobility  Bed Mobility               General bed mobility comments: OOB on arrival    Transfers Overall transfer level: Needs assistance   Transfers: Sit to/from Stand Sit to Stand: Contact guard assist           General transfer comment: cues for hand placement/safety    Ambulation/Gait Ambulation/Gait assistance: Contact guard assist, Min assist Gait Distance (Feet): 240 Feet Assistive device: Rolling walker (2 wheels) Gait Pattern/deviations: Step-through pattern   Gait velocity interpretation: <1.8 ft/sec, indicate of risk for recurrent falls   General Gait Details: mildly  unsteady with listing R and minimal drift, o/w symetrical gait and light use of the RW  Stairs            Wheelchair Mobility     Tilt Bed    Modified Rankin (Stroke Patients Only)       Balance Overall balance assessment: Needs assistance Sitting-balance support: No upper extremity supported Sitting balance-Leahy Scale: Normal     Standing balance support: Single extremity supported, Bilateral upper extremity supported, During functional activity Standing balance-Leahy Scale: Fair Standing balance comment: mildly unsteady without any UE or external assist                             Pertinent Vitals/Pain Pain Assessment Pain Assessment: Faces Faces Pain Scale: Hurts a little bit Pain Location: incision Pain Descriptors / Indicators: Sore Pain Intervention(s): Monitored during session    Home Living Family/patient expects to be discharged to:: Private residence Living Arrangements: Alone Available Help at Discharge: Family;Available 24 hours/day;Available PRN/intermittently Type of Home: Mobile home Home Access: Stairs to enter Entrance Stairs-Rails: Right;Left;Can reach both Entrance Stairs-Number of Steps: 3   Home Layout: One level Home Equipment: Grab bars - toilet;Grab bars - tub/shower;Shower seat;BSC/3in1;Cane - quad      Prior Function Prior Level of Function : Independent/Modified Independent;Needs assist;Driving                     Extremity/Trunk Assessment   Upper Extremity  Assessment Upper Extremity Assessment: Overall WFL for tasks assessed    Lower Extremity Assessment Lower Extremity Assessment: Overall WFL for tasks assessed (mild incoordination)       Communication        Cognition Arousal: Alert Behavior During Therapy: WFL for tasks assessed/performed   PT - Cognitive impairments: No apparent impairments                         Following commands: Intact       Cueing       General  Comments General comments (skin integrity, edema, etc.): vss    Exercises     Assessment/Plan    PT Assessment Patient needs continued PT services  PT Problem List Decreased activity tolerance;Decreased balance;Decreased mobility;Decreased coordination       PT Treatment Interventions Gait training;Stair training;Functional mobility training;Therapeutic activities;Balance training;Cognitive remediation    PT Goals (Current goals can be found in the Care Plan section)  Acute Rehab PT Goals Patient Stated Goal: back independent PT Goal Formulation: With patient Time For Goal Achievement: 08/09/24 Potential to Achieve Goals: Good    Frequency Min 3X/week     Co-evaluation               AM-PAC PT 6 Clicks Mobility  Outcome Measure Help needed turning from your back to your side while in a flat bed without using bedrails?: A Little Help needed moving from lying on your back to sitting on the side of a flat bed without using bedrails?: A Little Help needed moving to and from a bed to a chair (including a wheelchair)?: A Little Help needed standing up from a chair using your arms (e.g., wheelchair or bedside chair)?: A Little Help needed to walk in hospital room?: A Little Help needed climbing 3-5 steps with a railing? : A Little 6 Click Score: 18    End of Session   Activity Tolerance: Patient tolerated treatment well Patient left: in chair;with call bell/phone within reach;with family/visitor present Nurse Communication: Mobility status PT Visit Diagnosis: Unsteadiness on feet (R26.81);Other symptoms and signs involving the nervous system (R29.898)    Time: 8950-8884 PT Time Calculation (min) (ACUTE ONLY): 26 min   Charges:   PT Evaluation $PT Eval Moderate Complexity: 1 Mod PT Treatments $Gait Training: 8-22 mins PT General Charges $$ ACUTE PT VISIT: 1 Visit         07/26/2024  India HERO., PT Acute Rehabilitation Services [phone]   (office)  Vinie GAILS Serrina [place] 07/26/2024, 11:46 AM

## 2024-07-27 DIAGNOSIS — J449 Chronic obstructive pulmonary disease, unspecified: Secondary | ICD-10-CM | POA: Diagnosis not present

## 2024-07-27 DIAGNOSIS — C3490 Malignant neoplasm of unspecified part of unspecified bronchus or lung: Secondary | ICD-10-CM | POA: Diagnosis not present

## 2024-07-27 DIAGNOSIS — C7931 Secondary malignant neoplasm of brain: Secondary | ICD-10-CM | POA: Diagnosis not present

## 2024-07-27 DIAGNOSIS — I10 Essential (primary) hypertension: Secondary | ICD-10-CM | POA: Diagnosis not present

## 2024-07-27 MED ORDER — SENNOSIDES-DOCUSATE SODIUM 8.6-50 MG PO TABS: 1.0000 | ORAL_TABLET | Freq: Every evening | ORAL | Status: DC | PRN

## 2024-07-27 MED ORDER — POLYETHYLENE GLYCOL 3350 17 G PO PACK
17.0000 g | PACK | Freq: Every day | ORAL | Status: DC
  Administered 2024-07-27 – 2024-07-28 (×2): 17 g via ORAL
  Filled 2024-07-27 (×3): qty 1

## 2024-07-27 MED ORDER — ALUM & MAG HYDROXIDE-SIMETH 200-200-20 MG/5ML PO SUSP
30.0000 mL | ORAL | Status: DC | PRN
  Administered 2024-07-27: 30 mL via ORAL
  Filled 2024-07-27: qty 30

## 2024-07-27 MED ORDER — PANTOPRAZOLE SODIUM 40 MG PO TBEC
40.0000 mg | DELAYED_RELEASE_TABLET | Freq: Every day | ORAL | Status: DC
  Administered 2024-07-27 – 2024-07-28 (×2): 40 mg via ORAL
  Filled 2024-07-27 (×2): qty 1

## 2024-07-27 MED ORDER — SODIUM CHLORIDE 0.9% FLUSH
10.0000 mL | Freq: Two times a day (BID) | INTRAVENOUS | Status: DC
  Administered 2024-07-27 – 2024-07-29 (×4): 10 mL

## 2024-07-27 MED ORDER — SODIUM CHLORIDE 0.9% FLUSH
10.0000 mL | INTRAVENOUS | Status: DC | PRN
  Administered 2024-07-29: 10 mL

## 2024-07-27 MED ORDER — SERTRALINE HCL 50 MG PO TABS
50.0000 mg | ORAL_TABLET | Freq: Two times a day (BID) | ORAL | Status: DC
  Administered 2024-07-27 – 2024-07-29 (×5): 50 mg via ORAL
  Filled 2024-07-27 (×5): qty 1

## 2024-07-27 NOTE — Progress Notes (Signed)
 Attempted to call report again - no answer.   Care ongoing. Berneda Essex, RN

## 2024-07-27 NOTE — Progress Notes (Signed)
 Postop day 2.  Patient doing well overall.  Minimal headache.  Afebrile.  Vital signs are stable.  Urine output good.  Awake and alert.  Oriented and appropriate.  Motor and sensory function intact.  Wound clean and dry.  Patient doing well following posterior fossa craniectomy and resection of cerebellar metastasis.  Pathology pending.  Okay to transfer to floor.

## 2024-07-27 NOTE — Progress Notes (Signed)
 "  NAME:  Charles Grimes, MRN:  [id number], DOB:  1956-07-01, LOS: 4 ADMISSION DATE:  07/23/2024, CONSULTATION DATE:  1/9 REFERRING MD:  Dr Caleen , CHIEF COMPLAINT:  brain mass   History of Present Illness:  69 year old male with past medical history as below, which is significant for coronary artery disease, hypertension, hyperlipidemia, nonischemic cardiomyopathy, sleep apnea, and adenocarcinoma of the lung with since completed chemoradiation and is currently being managed on immunotherapy.  He presented to the emergency department with complaints of headache, nausea and vomiting, and gait disturbance.  CT head in the emergency department showed a new mass in the right cerebellum with surrounding edema and mass effect on the fourth ventricle.  There was also a mass found in the left occipital lobe and anterior right frontal lobe concerning for intracranial metastatic disease.  Neurosurgery and radiation oncology were consulted with plans for surgical resection of the cerebellar lesion followed by stereo static radiosurgery.  He was transferred to [facility] 1/9 and underwent right suboccipital craniectomy for resection of the cerebellar mets.  Postoperatively he was transferred to the neuro ICU for ongoing care.  PCCM was consulted.   Pertinent  Medical History   has a past medical history of Anginal pain, Anxiety, Arthritis, Coronary artery disease, non-occlusive (08/2013), Depression, Dyslipidemia, goal LDL below 100, Essential hypertension, Family history of premature CAD, GERD (gastroesophageal reflux disease), H/O skin disorder, Nonischemic cardiomyopathy (HCC) (05/2013), Obesity (BMI 30.0-34.9), OSA (obstructive sleep apnea) (06/21/2017), and Sleep apnea.   Significant Hospital Events: Including procedures, antibiotic start and stop dates in addition to other pertinent events   1/7 presented for headache and imbalance. Multiple brain lesions. Admitted 1/9 transferred to [facility] for resection of  cerebellar lesion  Interim History / Subjective:  NAEON, then up in chair, no distress  Objective    Blood pressure (!) 145/85, pulse (!) 53, temperature 97.6 F (36.4 C), temperature source Axillary, resp. rate 10, height 5' 11 (1.803 m), weight 74.8 kg, SpO2 94%.        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 He had previously completed chemoradiation and was on immunotherapy. Now with 3 brain mets most notably one to the right cerebellum s/p resection 1/9.  - Management per neurosurgery  - Decadron  taper per neurosurgery - Radiation oncology following with plans for SRS once recovered - Relief p.o. medications, IV morphine  DC'd  HTN - Resumed home lisinopril  1/10 - PRN hydralazine   CAD - holding ASA perioperatively - Statin resumd 1/10  COPD without acute exacerbation - LAMA LABA ICS nebs  It appears patient is transferring out of the ICU, once out of ICU PCCM will no longer follow.  Labs   CBC: Recent Labs  Lab 07/20/24 2202 07/23/24 1420 07/24/24 0512  WBC 4.5 8.4 6.8  NEUTROABS 3.5  --   --   HGB 12.3* 12.3* 12.1*  HCT 37.6* 38.4* 36.8*  MCV 100.5* 101.9* 100.0  PLT 150 152 148*    Basic Metabolic Panel: Recent Labs  Lab 07/20/24 2202 07/23/24 1420 07/24/24 0512  NA 139 142 139  K 4.4 4.0 4.2  CL 105 105 102  CO2 23 23 23   GLUCOSE 90 99 112*  BUN 19 19 18   CREATININE 0.82 0.78 0.78  CALCIUM  9.0 9.5  9.1   GFR: Estimated Creatinine Clearance: 93.5 mL/min (by C-G formula based on SCr of 0.78 mg/dL). Recent Labs  Lab 07/20/24 2202 07/23/24 1420 07/24/24 0512  WBC 4.5 8.4 6.8    Liver Function Tests: Recent Labs  Lab 07/20/24 2202 07/23/24 1420 07/24/24 0512  AST 27 24 19   ALT 12 12 14   ALKPHOS 124 118 113  BILITOT 0.6 0.8 0.7  PROT 7.4 7.8 7.2  ALBUMIN 3.7 4.1 3.9   Recent Labs  Lab 07/20/24 2202 07/23/24 1420  LIPASE 12 12   No results for input(s): AMMONIA in the last 168 hours.  ABG    Component Value Date/Time   HCO3 21.1 04/27/2024 1941   ACIDBASEDEF 3.0 (H) 04/27/2024 1941   O2SAT 98.5 04/27/2024 1941     Coagulation Profile: Recent Labs  Lab 07/24/24 0512  INR 1.2    Cardiac Enzymes: No results for input(s): CKTOTAL, CKMB, CKMBINDEX, TROPONINI in the last 168 hours.  HbA1C: Hgb A1c MFr Bld  Date/Time Value Ref Range Status  10/25/2015 08:59 AM 5.9 4.6 - 6.5 % Final    Comment:    Glycemic Control Guidelines for People with Diabetes:Non Diabetic:  <6%Goal of Therapy: <7%Additional Action Suggested:  >8%   06/10/2014 08:20 AM 6.0 4.6 - 6.5 % Final    Comment:    Glycemic Control Guidelines for People with Diabetes:Non Diabetic:  <6%Goal of Therapy: <7%Additional Action Suggested:  >8%     CBG: Recent Labs  Lab 07/23/24 1607 07/23/24 1728  GLUCAP 98 88    Review of Systems:   N/a   Past Medical History:  He,  has a past medical history of Anginal pain, Anxiety, Arthritis, Coronary artery disease, non-occlusive (08/2013), Depression, Dyslipidemia, goal LDL below 100, Essential hypertension, Family history of premature CAD, GERD (gastroesophageal reflux disease), H/O skin disorder, Nonischemic cardiomyopathy (HCC) (05/2013), Obesity (BMI 30.0-34.9), OSA (obstructive sleep apnea) (06/21/2017), and Sleep apnea.   Surgical History:   Past Surgical History:  Procedure Laterality Date   COLONOSCOPY  2015   COLONOSCOPY WITH  PROPOFOL   01/02/2017   Dr.Pyrtle   IR IMAGING GUIDED PORT INSERTION  04/02/2024   LEFT HEART CATHETERIZATION WITH CORONARY ANGIOGRAM  08/26/2013   Mild to moderate disease with 40-50% stenosis and OM1. Otherwise no significant CAD --  Surgeon: Alm LELON Clay, MD;  Location: [facility] CATH LAB;  Service: Cardiovascular;;   Lower extremity arterial Dopplers  06/11/2013   No occlusive disease   LUMBAR LAMINECTOMY/DECOMPRESSION MICRODISCECTOMY Right 10/07/2014   Procedure: LUMBAR LAMINECTOMY/DECOMPRESSION MICRODISCECTOMY 1 LEVEL;  Surgeon: Arley Helling, MD;  Location: [facility] NEURO ORS;  Service: Neurosurgery;  Laterality: Right;  Right L3-L4 Microdiscectomy   NM MYOVIEW  LTD  06/03/2013   Low risk study with no ischemia. EF roughly 44% with no regional wall motion abnormalities noted   PFTs  06/16/2013   Normal Volumes &  Spirometry; Moderately reduced DLCO   POLYPECTOMY     SHOULDER HEMI-ARTHROPLASTY Right 05/28/2015   Procedure: RIGHT SHOULDER HEMI-ARTHROPLASTY CTA HEAD AND SUBSCAP REPAIR ;  Surgeon: Marcey Her, MD;  Location: [facility] OR;  Service: Orthopedics;  Laterality: Right;   TRANSTHORACIC ECHOCARDIOGRAM  06/11/2013  Mildly reduced EF: 45-50%. Mild anterior hypokinesis with incoordinate septal motion. No evidence of pulmonary hypertension   VIDEO BRONCHOSCOPY WITH ENDOBRONCHIAL ULTRASOUND Left 02/19/2024   Procedure: BRONCHOSCOPY, WITH EBUS;  Surgeon: Shelah Lamar RAMAN, MD;  Location: [facility] ENDOSCOPY;  Service: Pulmonary;  Laterality: Left;     Social History:   reports that he has quit smoking. His smoking use included cigarettes. He has a 42 pack-year smoking history. He has never used smokeless tobacco. He reports that he does not currently use alcohol. He reports that he does not use drugs.   Family History:  His family history includes Atrial fibrillation in his mother; COPD in his mother; Colonic polyp in his mother; Diabetes in his paternal grandmother; Factor V Leiden deficiency in his father; Healthy  in his sister and son; Heart attack (age of onset: 44) in his maternal grandmother; Heart attack (age of onset: 49) in his sister; Heart attack (age of onset: 41) in his brother; Heart failure in his paternal grandmother; Hypertension in his father and mother; Lung cancer in his father. There is no history of Colon cancer, Esophageal cancer, Rectal cancer, or Stomach cancer.   Allergies Allergies[1]   Home Medications  Prior to Admission medications  Medication Sig Start Date End Date Taking? Authorizing Provider  arformoterol  (BROVANA ) 15 MCG/2ML NEBU Take 2 mLs (15 mcg total) by nebulization 2 (two) times daily. 05/01/24   Ruthell Lauraine FALCON, NP  budesonide  (PULMICORT ) 0.25 MG/2ML nebulizer solution Take 2 mLs (0.25 mg total) by nebulization 2 (two) times daily. 05/01/24 05/01/25  Ruthell Lauraine FALCON, NP  ipratropium (ATROVENT ) 0.02 % nebulizer solution Take 2.5 mLs (0.5 mg total) by nebulization 4 (four) times daily. 05/01/24   Ruthell Lauraine FALCON, NP  albuterol  (PROVENTIL ) (2.5 MG/3ML) 0.083% nebulizer solution Take 3 mLs (2.5 mg total) by nebulization every 6 (six) hours as needed for wheezing or shortness of breath. 04/27/24   Melvenia Motto, MD  albuterol  (VENTOLIN  HFA) 108 (90 Base) MCG/ACT inhaler Inhale 2 puffs into the lungs every 6 (six) hours as needed for wheezing or shortness of breath. 02/25/24   Ruthell Lauraine FALCON, NP  ALPRAZolam  (XANAX ) 0.5 MG tablet Take 1 tablet (0.5 mg total) by mouth at bedtime as needed for anxiety. 10/24/23   [name], DO  alum & mag hydroxide-simeth (MAALOX MAX) 400-400-40 MG/5ML suspension Take 15 mLs by mouth every 6 (six) hours as needed for indigestion. 06/01/24   Kingsley, Victoria K, DO  aspirin  EC 81 MG tablet Take 81 mg by mouth daily.    [provider]  atorvastatin  (LIPITOR) 10 MG tablet Take 1 tablet (10 mg total) by mouth daily. 10/24/23   [name], DO  budesonide -glycopyrrolate -formoterol (BREZTRI  AEROSPHERE) 160-9-4.8 MCG/ACT AERO  inhaler Inhale 2 puffs into the lungs in the morning and at bedtime. 02/25/24   Ruthell Lauraine FALCON, NP  budesonide -glycopyrrolate -formoterol (BREZTRI  AEROSPHERE) 160-9-4.8 MCG/ACT AERO inhaler Inhale 2 puffs into the lungs in the morning and at bedtime. 02/25/24   Ruthell Lauraine FALCON, NP  clotrimazole  (MYCELEX ) 10 MG troche Take 1 tablet (10 mg total) by mouth 3 (three) times daily. 02/25/24   Ruthell Lauraine FALCON, NP  colchicine  0.6 MG tablet Take 1 tablet (0.6 mg total) by mouth daily as needed (gout flares). 02/19/24   Shelah Lamar RAMAN, MD  docusate sodium  (COLACE) 100 MG capsule Take 1 capsule (100 mg total) by mouth every 12 (twelve) hours. 04/14/24   Mannie Fairy DASEN, DO  HYDROcodone -acetaminophen  (NORCO/VICODIN) 5-325 MG tablet Take  1 tablet by mouth every 6 (six) hours as needed for moderate pain (pain score 4-6). 05/26/24   Shannon Agent, MD  lidocaine  (XYLOCAINE ) 2 % solution Use as directed 15 mLs in the mouth or throat every 6 (six) hours as needed for mouth pain. 04/28/24   Sherrod Sherrod, MD  lidocaine  (XYLOCAINE ) 2 % solution Use as directed 15 mLs in the mouth or throat every 4 (four) hours as needed for mouth pain. 06/01/24   Kingsley, Victoria K, DO  lisinopril  (ZESTRIL ) 10 MG tablet Take 1 tablet (10 mg total) by mouth daily. 01/14/24   [name], DO  methylPREDNISolone  (MEDROL  DOSEPAK) 4 MG TBPK tablet Use as instructed 05/12/24   Sherrod Sherrod, MD  metoCLOPramide  (REGLAN ) 10 MG tablet Take 1 tablet (10 mg total) by mouth every 8 (eight) hours as needed for nausea. 04/27/24   Melvenia Motto, MD  Multiple Vitamins-Minerals (CENTRUM SILVER 50+MEN) TABS Take 1 tablet by mouth daily.    [provider]  nitroGLYCERIN  (NITROSTAT ) 0.4 MG SL tablet Place 1 tablet (0.4 mg total) under the tongue every 5 (five) minutes as needed for chest pain. 02/08/21   [name], DO  NON FORMULARY Pt uses a c-pap nightly    [provider]  omeprazole  (PRILOSEC ) 20 MG capsule Take  1 capsule (20 mg total) by mouth daily. 01/14/24   [name], DO  ondansetron  (ZOFRAN -ODT) 4 MG disintegrating tablet Take 1 tablet (4 mg total) by mouth every 8 (eight) hours as needed. 07/21/24   Griselda Norris, MD  polyethylene glycol powder (MIRALAX ) 17 GM/SCOOP powder Take 17 g by mouth daily. Dissolve 1 capful (17g) in 4-8 ounces of liquid and take by mouth daily. 04/14/24   Mannie Pac T, DO  sertraline  (ZOLOFT ) 50 MG tablet Take 2 tabs in the morning and 1 tab in the evening. 01/14/24   [name], DO  Spacer/Aero-Holding Chambers (AEROCHAMBER PLUS) Device Per patient request to help with inhaler management and effectiveness. 04/28/24   Ruthell Lauraine FALCON, NP  sucralfate  (CARAFATE ) 1 g tablet Take 1 tablet (1 g total) by mouth 4 (four) times daily. Dissolve each tablet in 15 cc water before use. 06/01/24   Kingsley, Victoria K, DO  tiZANidine  (ZANAFLEX ) 4 MG tablet Take 1 tablet (4 mg total) by mouth at bedtime as needed for muscle spasms. 01/14/24   [name], DO  triamcinolone  cream (KENALOG ) 0.1 % Apply 1 Application topically 2 (two) times daily as needed (itch). 11/12/23   [name], DO     Critical care time: n/a     Donnice JONELLE Beals, MD [facility]  See Amion for contact info PCCM on call pager [phone] until 7pm. Please call Elink 7p-7a. [phone]  07/27/2024 10:45 AM                [1]  Allergies Allergen Reactions   Ibuprofen     GI Upset   Isosorbide  Nitrate Other (See Comments)    CONTINUOUS HEADACHE    Oxycodone  Itching    Reaction was to straight oxycodone  15 mg tablets - no reaction to percocet   Penicillins Hives and Rash    Has patient had a PCN reaction causing immediate rash, facial/tongue/throat swelling, SOB or lightheadedness with hypotension: Yes Has patient had a PCN reaction causing severe rash involving mucus membranes or skin necrosis: No Has patient had  a PCN reaction that required hospitalization No Has patient had a PCN  reaction occurring within the last 10 years: No If all of the above answers are NO, then may proceed with Cephalosporin use.   "

## 2024-07-27 NOTE — Progress Notes (Signed)
 Attempted to call report to 4NP - no answer. Will try again in 10 minutes.  Care ongoing.  Berneda Essex, RN

## 2024-07-28 ENCOUNTER — Encounter (HOSPITAL_COMMUNITY): Payer: Self-pay | Admitting: Neurological Surgery

## 2024-07-28 ENCOUNTER — Inpatient Hospital Stay

## 2024-07-28 MED ORDER — DEXAMETHASONE 4 MG PO TABS
4.0000 mg | ORAL_TABLET | Freq: Three times a day (TID) | ORAL | Status: DC
  Administered 2024-07-28 – 2024-07-29 (×4): 4 mg via ORAL
  Filled 2024-07-28 (×5): qty 1

## 2024-07-28 NOTE — Progress Notes (Signed)
 Patient ID: Charles Grimes, male   DOB: Dec 22, 1955, 69 y.o.   MRN: [id number] He looks good today.  No real headache.  He states he is walking better with the less dizziness.  Incision looks good.  He is awake and alert and conversant.  Moving all extremities well.  PT and OT.  Discharge when he is stable.  Continue Decadron .  Will need follow-up with radiation oncology and medical oncology for adjuvant therapy

## 2024-07-28 NOTE — Evaluation (Signed)
 Occupational Therapy Evaluation Patient Details Name: Charles Grimes MRN: [id number] DOB: [birth year] Today's Date: 07/28/2024   History of Present Illness   Pt is a 69 y/o male admitted 1/7 with brain masses, with symptom onset over a few days with pain and balance issues.  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  1/9   Right suboccipital craniectomy for resection of cerebellar metastasis  PMHx:CAD, HTN, NICM, OSA/sleep apnea on CPAP     Clinical Impressions PTA, pt lived alone and was independent for BADL, IADL, and driving. Upon eval, pt presents with decreased strength, balance, and vision. Pt needing up to set-up for UB ADL and CGA for LB ADL. Brushing teeth, washing face at sink and using restroom with CGA and RW for mobility with good tolerance this session. Pt to continue to benefit from acute OT services. Pt will continue to benefit from acute OT services. Recommending discharge home with OP OT follow up.        If plan is discharge home, recommend the following:   A little help with walking and/or transfers;A little help with bathing/dressing/bathroom;Assistance with cooking/housework;Assist for transportation;Help with stairs or ramp for entrance     Functional Status Assessment   Patient has had a recent decline in their functional status and demonstrates the ability to make significant improvements in function in a reasonable and predictable amount of time.     Equipment Recommendations   Tub/shower seat     Recommendations for Other Services         Precautions/Restrictions   Precautions Precautions: Fall Restrictions Weight Bearing Restrictions Per Provider Order: No     Mobility Bed Mobility Overal bed mobility: Needs Assistance Bed Mobility: Supine to Sit     Supine to sit: Contact guard     General bed mobility comments: HOB elevated, increased time, no difficulty     Transfers Overall transfer level: Needs assistance   Transfers: Sit to/from Stand Sit to Stand: Contact guard assist           General transfer comment: cues for hand placement/safety, increased time, wide base of support      Balance Overall balance assessment: Needs assistance Sitting-balance support: No upper extremity supported Sitting balance-Leahy Scale: Good     Standing balance support: Single extremity supported, Bilateral upper extremity supported, During functional activity Standing balance-Leahy Scale: Fair Standing balance comment: mildly unsteady without any UE or external assist                           ADL either performed or assessed with clinical judgement   ADL Overall ADL's : Needs assistance/impaired Eating/Feeding: Set up;Sitting   Grooming: Contact guard assist;Standing;Oral care;Wash/dry face;Wash/dry hands   Upper Body Bathing: Set up;Sitting   Lower Body Bathing: Contact guard assist;Sit to/from stand   Upper Body Dressing : Set up;Sitting   Lower Body Dressing: Contact guard assist;Sit to/from stand   Toilet Transfer: Contact guard assist;Ambulation;Rolling walker (2 wheels)   Toileting- Clothing Manipulation and Hygiene: Supervision/safety;Sitting/lateral lean       Functional mobility during ADLs: Contact guard assist;Rolling walker (2 wheels)       Vision Baseline Vision/History: 0 No visual deficits Patient Visual Report: Other (comment) (pt reports it feels like there is sand in his R eye and that MD said from procedure. Pt reports that he sometimes sees blurry) Vision Assessment?: Vision impaired- to be further tested in functional  context Additional Comments: pt able to track in all quads but losing stimulus for brief periods with additional blinking while tracking on L. visual fields appear to be intact     Perception         Praxis         Pertinent Vitals/Pain Pain Assessment Pain Assessment:  Faces Faces Pain Scale: Hurts a little bit Pain Location: incision Pain Descriptors / Indicators: Sore Pain Intervention(s): Limited activity within patient's tolerance, Monitored during session     Extremity/Trunk Assessment Upper Extremity Assessment Upper Extremity Assessment: Overall WFL for tasks assessed   Lower Extremity Assessment Lower Extremity Assessment: Defer to PT evaluation       Communication Communication Communication: No apparent difficulties   Cognition Arousal: Alert Behavior During Therapy: WFL for tasks assessed/performed Cognition: No apparent impairments                               Following commands: Intact       Cueing  General Comments      VSS   Exercises     Shoulder Instructions      Home Living Family/patient expects to be discharged to:: Private residence Living Arrangements: Alone Available Help at Discharge: Family;Available 24 hours/day;Available PRN/intermittently Type of Home: Mobile home Home Access: Stairs to enter Entrance Stairs-Number of Steps: 3 Entrance Stairs-Rails: Right;Left;Can reach both Home Layout: One level     Bathroom Shower/Tub: Tub/shower unit;Walk-in shower   Bathroom Toilet: Standard     Home Equipment: Grab bars - toilet;Grab bars - tub/shower;Shower seat;BSC/3in1;Cane - quad          Prior Functioning/Environment Prior Level of Function : Independent/Modified Independent;Driving                    OT Problem List: Decreased strength;Decreased activity tolerance;Impaired balance (sitting and/or standing)   OT Treatment/Interventions: Self-care/ADL training;Therapeutic exercise;DME and/or AE instruction;Therapeutic activities;Patient/family education;Balance training      OT Goals(Current goals can be found in the care plan section)   Acute Rehab OT Goals Patient Stated Goal: get back to being independent OT Goal Formulation: With patient Time For Goal  Achievement: 08/11/24 Potential to Achieve Goals: Good   OT Frequency:  Min 2X/week    Co-evaluation              AM-PAC OT 6 Clicks Daily Activity     Outcome Measure Help from another person eating meals?: None Help from another person taking care of personal grooming?: A Little Help from another person toileting, which includes using toliet, bedpan, or urinal?: A Little Help from another person bathing (including washing, rinsing, drying)?: A Little Help from another person to put on and taking off regular upper body clothing?: A Little Help from another person to put on and taking off regular lower body clothing?: A Little 6 Click Score: 19   End of Session Equipment Utilized During Treatment: Gait belt;Rolling walker (2 wheels) Nurse Communication: Mobility status  Activity Tolerance: Patient tolerated treatment well Patient left: in chair;with call bell/phone within reach;with chair alarm set  OT Visit Diagnosis: Unsteadiness on feet (R26.81);Muscle weakness (generalized) (M62.81);Low vision, both eyes (H54.2)                Time: 8671-8645 OT Time Calculation (min): 26 min Charges:  OT General Charges $OT Visit: 1 Visit OT Evaluation $OT Eval Moderate Complexity: 1 Mod OT Treatments $Self Care/Home Management : 8-22 mins  Charles Grimes, OTR/L [facility] Acute Rehabilitation Office: [phone]   Charles Grimes 07/28/2024, 4:51 PM

## 2024-07-28 NOTE — Progress Notes (Signed)
 Physical Therapy Treatment Patient Details Name: Charles Grimes MRN: [id number] DOB: 05/17/1956 Today's Date: 07/28/2024   History of Present Illness Pt is a 69 y/o male admitted 1/7 with brain masses, with symptom onset over a few days with pain and balance issues.  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 Attempted to ambulate without RW however pt unsteady and requiring external assist to maintain balance. Will trial rollator vs RW tomorrow for ADs. Pt reports family to stay with him at his house upon d/c to provide 24/7 assist. Pt to continue to benefit from oupt neuro PT to address impaired balance and decrease falls risk. Acute PT to cont to follow.    If plan is discharge home, recommend the following:     Can travel by private vehicle        Equipment Recommendations  Rollator (4 wheels)    Recommendations for Other Services       Precautions / Restrictions Precautions Precautions: Fall Restrictions Weight Bearing Restrictions Per Provider Order: No     Mobility  Bed Mobility Overal bed mobility: Needs Assistance Bed Mobility: Supine to Sit     Supine to sit: Contact guard     General bed mobility comments: HOB elevated, increased time, no difficulty    Transfers Overall transfer level: Needs assistance   Transfers: Sit to/from Stand Sit to Stand: Contact guard assist           General transfer comment: cues for hand placement/safety, increased time, wide base of support    Ambulation/Gait Ambulation/Gait assistance: Contact guard assist, Min assist Gait Distance (Feet): 200 Feet Assistive device: Rolling walker (2 wheels) Gait Pattern/deviations: Step-through pattern Gait velocity: faster with RW compared to HHA Gait  velocity interpretation: <1.8 ft/sec, indicate of risk for recurrent falls   General Gait Details: pt with increased bilat UE dependence. Attempted to amb without RW however pt requiring minA and HHA to maintain stability   Stairs             Wheelchair Mobility     Tilt Bed    Modified Rankin (Stroke Patients Only)       Balance Overall balance assessment: Needs assistance Sitting-balance support: No upper extremity supported Sitting balance-Leahy Scale: Good     Standing balance support: Single extremity supported, Bilateral upper extremity supported, During functional activity Standing balance-Leahy Scale: Fair Standing balance comment: mildly unsteady without any UE or external assist                            Communication Communication Communication: No apparent difficulties  Cognition Arousal: Alert Behavior During Therapy: WFL for tasks assessed/performed   PT - Cognitive impairments: No apparent impairments                         Following commands: Intact      Cueing    Exercises      General Comments General comments (skin integrity, edema, etc.): VSS      Pertinent Vitals/Pain      Home Living Family/patient expects to be discharged to:: Private residence Living Arrangements: Alone Available Help at Discharge: Family;Available 24 hours/day;Available PRN/intermittently Type of Home: Mobile home Home Access:  Stairs to enter Entrance Stairs-Rails: Right;Left;Can reach both Entrance Stairs-Number of Steps: 3   Home Layout: One level Home Equipment: Grab bars - toilet;Grab bars - tub/shower;Shower seat;BSC/3in1;Cane - quad      Prior Function            PT Goals (current goals can now be found in the care plan section) Acute Rehab PT Goals Patient Stated Goal: back independent PT Goal Formulation: With patient Time For Goal Achievement: 08/09/24 Potential to Achieve Goals: Good Progress towards PT goals:  Progressing toward goals    Frequency    Min 3X/week      PT Plan      Co-evaluation              AM-PAC PT 6 Clicks Mobility   Outcome Measure  Help needed turning from your back to your side while in a flat bed without using bedrails?: A Little Help needed moving from lying on your back to sitting on the side of a flat bed without using bedrails?: A Little Help needed moving to and from a bed to a chair (including a wheelchair)?: A Little Help needed standing up from a chair using your arms (e.g., wheelchair or bedside chair)?: A Little Help needed to walk in hospital room?: A Little Help needed climbing 3-5 steps with a railing? : A Little 6 Click Score: 18    End of Session Equipment Utilized During Treatment: Gait belt Activity Tolerance: Patient tolerated treatment well Patient left: in chair (with OT present) Nurse Communication: Mobility status PT Visit Diagnosis: Unsteadiness on feet (R26.81);Other symptoms and signs involving the nervous system (R29.898)     Time: 8682-8665 PT Time Calculation (min) (ACUTE ONLY): 17 min  Charges:    $Gait Training: 8-22 mins PT General Charges $$ ACUTE PT VISIT: 1 Visit                     Norene Ames, PT, DPT Acute Rehabilitation Services Secure chat preferred Office #: [phone]    Norene CHRISTELLA Ames 07/28/2024, 3:00 PM

## 2024-07-28 NOTE — Progress Notes (Signed)
 I spoke with the patient's son Charles Grimes today by phone and reviewed our discussion from brain oncology conference. It appears the resection went well as well as his recover so far. We will have our special procedures navigator reach out to the paitent and his family later this week in preparation for fractionated SRS treatment.

## 2024-07-28 NOTE — Care Management Important Message (Signed)
 Important Message  Patient Details  Name: Charles Grimes MRN: [id number] Date of Birth: 1956/01/29   Important Message Given:  Yes - Medicare IM     [name] 07/28/2024, 1:10 PM

## 2024-07-29 MED ORDER — DEXAMETHASONE 4 MG PO TABS: 4.0000 mg | ORAL_TABLET | Freq: Three times a day (TID) | ORAL | 0 refills | Status: AC

## 2024-07-29 MED ORDER — HEPARIN SOD (PORK) LOCK FLUSH 100 UNIT/ML IV SOLN
500.0000 [IU] | INTRAVENOUS | Status: AC | PRN
  Administered 2024-07-29: 500 [IU]

## 2024-07-29 NOTE — Plan of Care (Signed)
" °  Problem: Education: Goal: Knowledge of General Education information will improve Description: Including pain rating scale, medication(s)/side effects and non-pharmacologic comfort measures Outcome: Completed/Met   Problem: Health Behavior/Discharge Planning: Goal: Ability to manage health-related needs will improve Outcome: Completed/Met   Problem: Clinical Measurements: Goal: Ability to maintain clinical measurements within normal limits will improve Outcome: Completed/Met Goal: Will remain free from infection Outcome: Completed/Met Goal: Diagnostic test results will improve Outcome: Completed/Met Goal: Respiratory complications will improve Outcome: Completed/Met Goal: Cardiovascular complication will be avoided Outcome: Completed/Met   Problem: Activity: Goal: Risk for activity intolerance will decrease Outcome: Completed/Met   Problem: Nutrition: Goal: Adequate nutrition will be maintained Outcome: Completed/Met   Problem: Coping: Goal: Level of anxiety will decrease Outcome: Completed/Met   Problem: Elimination: Goal: Will not experience complications related to bowel motility Outcome: Completed/Met Goal: Will not experience complications related to urinary retention Outcome: Completed/Met   Problem: Pain Managment: Goal: General experience of comfort will improve and/or be controlled Outcome: Completed/Met   Problem: Safety: Goal: Ability to remain free from injury will improve Outcome: Completed/Met   Problem: Skin Integrity: Goal: Risk for impaired skin integrity will decrease Outcome: Completed/Met   Problem: Education: Goal: Knowledge of the prescribed therapeutic regimen will improve Outcome: Completed/Met   Problem: Bowel/Gastric: Goal: Gastrointestinal status for postoperative course will improve Outcome: Completed/Met   Problem: Cardiac: Goal: Ability to maintain an adequate cardiac output Outcome: Completed/Met Goal: Will show no  evidence of cardiac arrhythmias Outcome: Completed/Met   Problem: Nutritional: Goal: Will attain and maintain optimal nutritional status Outcome: Completed/Met   Problem: Neurological: Goal: Will regain or maintain usual level of consciousness Outcome: Completed/Met   Problem: Clinical Measurements: Goal: Ability to maintain clinical measurements within normal limits Outcome: Completed/Met Goal: Postoperative complications will be avoided or minimized Outcome: Completed/Met   Problem: Respiratory: Goal: Will regain and/or maintain adequate ventilation Outcome: Completed/Met Goal: Respiratory status will improve Outcome: Completed/Met   Problem: Skin Integrity: Goal: Demonstrates signs of wound healing without infection Outcome: Completed/Met   Problem: Urinary Elimination: Goal: Will remain free from infection Outcome: Completed/Met Goal: Ability to achieve and maintain adequate urine output Outcome: Completed/Met   Problem: Education: Goal: Knowledge of the prescribed therapeutic regimen will improve Outcome: Completed/Met   Problem: Clinical Measurements: Goal: Usual level of consciousness will be regained or maintained. Outcome: Completed/Met Goal: Neurologic status will improve Outcome: Completed/Met Goal: Ability to maintain intracranial pressure will improve Outcome: Completed/Met   Problem: Skin Integrity: Goal: Demonstration of wound healing without infection will improve Outcome: Completed/Met   "

## 2024-07-29 NOTE — Progress Notes (Signed)
 Speech Language Pathology Treatment: Dysphagia  Patient Details Name: Charles Grimes MRN: [id number] DOB: [birth year] Today's Date: 07/29/2024 Time: 8944-8884 SLP Time Calculation (min) (ACUTE ONLY): 20 min  Assessment / Plan / Recommendation Clinical Impression  Pt has had neurosurgical intervention since initial SLP eval, so oral motor exam was repeated. Pt reports subtle changes to sensation on the lower R side of his face (CN V). He is experiencing changes to his R eye as well but reports no other new changes. He had one instance of trace, anterior spillage when cued to drink thin liquids at a faster rate. Otherwise, oropharyngeal swallow appears to be grossly functional.   PLAN: Continue with regular solids and thin liquids using general aspiration and esophageal precautions. No further acute SLP interventions indicated at this time.    HPI HPI: Charles Grimes is a 69 yo male presenting after several days of headache, N/V, and imbalance. 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 He started to exhibit difficulty swallowing in Oct/Nov 2025 due to odynophagia and severe heartburn while undergoing chemoradiation.      SLP Plan  All goals met        Swallow Evaluation Recommendations   Recommendations: PO diet PO Diet Recommendation: Regular;Thin liquids (Level 0) Liquid Administration via: Cup;Straw Medication Administration: Whole meds with liquid Supervision: Patient able to self-feed Postural changes: Position pt fully upright for meals;Stay upright 30-60 min after meals Oral care  recommendations: Oral care BID (2x/day)     Recommendations                           Dysphagia, unspecified (R13.10)     All goals met     [name], M.A. CCC-SLP Acute Rehabilitation Services Office: [phone]  Secure chat preferred   07/29/2024, 11:23 AM

## 2024-07-29 NOTE — TOC Transition Note (Signed)
 Transition of Care (TOC) - Discharge Note Rayfield Gobble RN,BSN Inpatient Care Management Unit 4NP (Non Trauma)- RN Case Manager See Treatment Team for direct Phone #   Patient Details  Name: Charles Grimes MRN: [id number] Date of Birth: [birth year]  Transition of Care [facility]) CM/SW Contact:  Gobble Rayfield Hurst, RN Phone Number: 07/29/2024, 11:35 AM   Clinical Narrative:    Pt stable for transition home today, orders placed for [facility] and DME needs. PT to eval this am for rollator with pt.   1115- updated recs for rollator per PT- call made to K. Meyran NP, for verbal order.   CM spoke with pt and son who is at the bedside to transport pt home. Discussed DME and HH needs. Explained to pt that his associate professor with [employer] for all DME needs- pt request that rollator be delivered to his home as his son needs to get back to work. Referral for DME- rollator sent to [employer] via [facility] as well as msg to liaison- request home delivery.   List provided for [facility] choice- Per CMS guidelines from phonefinancing.pl website with star ratings (copy placed in shadow chart)- [facility] was selected per pt/son for [facility] needs.  Referral sent to [facility] via Hub- referral has been accepted.   Address, phone # and PCP all confirmed- home # is preferred for contacting.   IP CM interventions have been completed no further needs noted.   Final next level of care: Home w Home Health Services Barriers to Discharge: Barriers Resolved   Patient Goals and CMS Choice Patient states their goals for this hospitalization and ongoing recovery are:: Wants to feel better CMS Medicare.gov Compare Post Acute Care list provided to:: Patient Choice offered to / list presented to : Patient, Adult Children      Discharge Placement               Home w/ [facility]        Discharge Plan and Services Additional resources added to the After Visit Summary for   In-house Referral: NA Discharge Planning Services: CM Consult Post  Acute Care Choice: Home Health, Durable Medical Equipment          DME Arranged: Walker rolling with seat DME Agency: [employer] Date DME Agency Contacted: 07/29/24 Time DME Agency Contacted: 1045 Representative spoke with at DME Agency: Charles Grimes/[facility] HH Arranged: PT, OT HH Agency: [facility] Date [facility] Agency Contacted: 07/29/24 Time HH Agency Contacted: 1130 Representative spoke with at [facility] Agency: [facility]  Social Drivers of Health (SDOH) Interventions SDOH Screenings   Food Insecurity: No Food Insecurity (07/24/2024)  Housing: Low Risk (07/24/2024)  Transportation Needs: No Transportation Needs (07/24/2024)  Utilities: Not At Risk (07/24/2024)  Alcohol Screen: Low Risk (10/02/2023)  Depression (PHQ2-9): Low Risk (07/08/2024)  Financial Resource Strain: Low Risk (10/02/2023)  Physical Activity: Inactive (10/02/2023)  Social Connections: Socially Isolated (07/24/2024)  Stress: No Stress Concern Present (10/02/2023)  Tobacco Use: Medium Risk (07/25/2024)  Health Literacy: Adequate Health Literacy (10/02/2023)     Readmission Risk Interventions    07/24/2024    2:54 PM  Readmission Risk Prevention Plan  Transportation Screening Complete  PCP or Specialist Appt within 3-5 Days Complete  HRI or Home Care Consult Complete  Social Work Consult for Recovery Care Planning/Counseling Complete  Palliative Care Screening Not Applicable  Medication Review Oceanographer) Referral to Pharmacy

## 2024-07-29 NOTE — Progress Notes (Signed)
 Physical Therapy Treatment Patient Details Name: Charles Grimes MRN: [id number] DOB: 1956-01-24 Today's Date: 07/29/2024   History of Present Illness Pt is a 69 y/o male admitted 1/7 with brain masses, with symptom onset over a few days with pain and balance issues.  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 Trialed rollator this date. Pt with good management of brakes during transfers. Pt with improved fluidity of gait with rollator and increase cadence. Pt to benefit from rollator upon d/c to improve safety with ambulation and to provide safe rest area with onset of fatigue. Acute PT to cont to follow. Continue to recommend follow up PT to address balance deficits to decrease fall risk.    If plan is discharge home, recommend the following:     Can travel by private vehicle        Equipment Recommendations  Rollator (4 wheels)    Recommendations for Other Services       Precautions / Restrictions Precautions Precautions: Fall Restrictions Weight Bearing Restrictions Per Provider Order: No     Mobility  Bed Mobility Overal bed mobility: Needs Assistance Bed Mobility: Sit to Supine       Sit to supine: Contact guard assist   General bed mobility comments: pt received sitting EOB, pt able to return to supine without physical assist    Transfers Overall transfer level: Needs assistance Equipment used: Rollator (4 wheels) Transfers: Sit to/from Stand Sit to Stand: Contact guard assist           General transfer comment: cues for hand placement/safety, educated on how to lock brakes on rollator, pt with good return demonstration    Ambulation/Gait Ambulation/Gait assistance: Contact guard assist, Min assist Gait Distance (Feet): 250  Feet Assistive device: Rollator (4 wheels) Gait Pattern/deviations: Step-through pattern Gait velocity: faster with rollator compared to RW Gait velocity interpretation: 1.31 - 2.62 ft/sec, indicative of limited community ambulator   General Gait Details: pt with more fluid gait pattern this date and good use/management of rollator   Stairs             Wheelchair Mobility     Tilt Bed    Modified Rankin (Stroke Patients Only)       Balance Overall balance assessment: Needs assistance Sitting-balance support: No upper extremity supported Sitting balance-Leahy Scale: Good     Standing balance support: Single extremity supported, Bilateral upper extremity supported, During functional activity Standing balance-Leahy Scale: Fair Standing balance comment: mildly unsteady without any UE or external assist                            Communication Communication Communication: No apparent difficulties  Cognition Arousal: Alert Behavior During Therapy: WFL for tasks assessed/performed   PT - Cognitive impairments: No apparent impairments                         Following commands: Intact      Cueing    Exercises      General Comments General comments (skin integrity, edema, etc.): VSS      Pertinent Vitals/Pain Pain Assessment Pain Assessment: Faces Faces Pain Scale: Hurts a little bit Pain Location: incision Pain Descriptors / Indicators:  Sore    Home Living                          Prior Function            PT Goals (current goals can now be found in the care plan section) Acute Rehab PT Goals Patient Stated Goal: back independent PT Goal Formulation: With patient Time For Goal Achievement: 08/09/24 Potential to Achieve Goals: Good Progress towards PT goals: Progressing toward goals    Frequency    Min 3X/week      PT Plan      Co-evaluation              AM-PAC PT 6 Clicks Mobility   Outcome  Measure  Help needed turning from your back to your side while in a flat bed without using bedrails?: A Little Help needed moving from lying on your back to sitting on the side of a flat bed without using bedrails?: A Little Help needed moving to and from a bed to a chair (including a wheelchair)?: A Little Help needed standing up from a chair using your arms (e.g., wheelchair or bedside chair)?: A Little Help needed to walk in hospital room?: A Little Help needed climbing 3-5 steps with a railing? : A Little 6 Click Score: 18    End of Session Equipment Utilized During Treatment: Gait belt Activity Tolerance: Patient tolerated treatment well Patient left: in bed;with call bell/phone within reach;with family/visitor present Nurse Communication: Mobility status PT Visit Diagnosis: Unsteadiness on feet (R26.81);Other symptoms and signs involving the nervous system (R29.898)     Time: 8961-8941 PT Time Calculation (min) (ACUTE ONLY): 20 min  Charges:    $Gait Training: 8-22 mins PT General Charges $$ ACUTE PT VISIT: 1 Visit                     Norene Ames, PT, DPT Acute Rehabilitation Services Secure chat preferred Office #: [phone]    Norene CHRISTELLA Ames 07/29/2024, 11:04 AM

## 2024-07-29 NOTE — Discharge Summary (Signed)
 Physician Discharge Summary  Patient ID: IHAN PAT MRN: [id number] DOB/AGE: 09-07-1955 69 y.o.  Admit date: 07/23/2024 Discharge date: 07/29/2024  Admission Diagnoses: Multiple brain metastasis    Discharge Diagnoses: same, status post suboccipital craniectomy for tumor   Discharged Condition: good  Hospital Course: The patient was admitted on 07/23/2024 and taken to the operating room where the patient underwent right suboccipital craniectomy for excision of cerebellar tumor. The patient tolerated the procedure well and was taken to the recovery room and then to the ICU in stable condition. The hospital course was routine. There were no complications. The wound remained clean dry and intact. Pt had appropriate incisional soreness.  Follow-up MRI was reviewed.  He was discussed at brain tumor conference and oncology is arranging follow-up for radiation therapy.  He remained on Decadron  4 mg every 8 hours.  Oncology would like to continue this.  The patient remained afebrile with stable vital signs, and tolerated a regular diet. The patient continued to increase activities, and pain was well controlled with oral pain medications.   Consults: pulmonary/intensive care and hematology/oncology  Significant Diagnostic Studies:  Results for orders placed or performed during the hospital encounter of 07/23/24  Comprehensive metabolic panel   Collection Time: 07/23/24  2:20 PM  Result Value Ref Range   Sodium 142 135 - 145 mmol/L   Potassium 4.0 3.5 - 5.1 mmol/L   Chloride 105 98 - 111 mmol/L   CO2 23 22 - 32 mmol/L   Glucose, Bld 99 70 - 99 mg/dL   BUN 19 8 - 23 mg/dL   Creatinine, Ser 9.21 0.61 - 1.24 mg/dL   Calcium  9.5 8.9 - 10.3 mg/dL   Total Protein 7.8 6.5 - 8.1 g/dL   Albumin 4.1 3.5 - 5.0 g/dL   AST 24 15 - 41 U/L   ALT 12 0 - 44 U/L   Alkaline Phosphatase 118 38 - 126 U/L   Total Bilirubin 0.8 0.0 - 1.2 mg/dL   GFR, Estimated >39 >39 mL/min   Anion gap 14 5 - 15  CBC    Collection Time: 07/23/24  2:20 PM  Result Value Ref Range   WBC 8.4 4.0 - 10.5 K/uL   RBC 3.77 (L) 4.22 - 5.81 MIL/uL   Hemoglobin 12.3 (L) 13.0 - 17.0 g/dL   HCT 61.5 (L) 60.9 - 47.9 %   MCV 101.9 (H) 80.0 - 100.0 fL   MCH 32.6 26.0 - 34.0 pg   MCHC 32.0 30.0 - 36.0 g/dL   RDW 84.3 (H) 88.4 - 84.4 %   Platelets 152 150 - 400 K/uL   nRBC 0.0 0.0 - 0.2 %  Lipase, blood   Collection Time: 07/23/24  2:20 PM  Result Value Ref Range   Lipase 12 11 - 51 U/L  CBG monitoring, ED   Collection Time: 07/23/24  4:07 PM  Result Value Ref Range   Glucose-Capillary 98 70 - 99 mg/dL  CBG monitoring, ED   Collection Time: 07/23/24  5:28 PM  Result Value Ref Range   Glucose-Capillary 88 70 - 99 mg/dL  Urinalysis, Routine w reflex microscopic -Urine, Clean Catch   Collection Time: 07/23/24 11:18 PM  Result Value Ref Range   Color, Urine YELLOW YELLOW   APPearance CLEAR CLEAR   Specific Gravity, Urine >1.046 (H) 1.005 - 1.030   pH 5.0 5.0 - 8.0   Glucose, UA NEGATIVE NEGATIVE mg/dL   Hgb urine dipstick NEGATIVE NEGATIVE   Bilirubin Urine NEGATIVE NEGATIVE  Ketones, ur 20 (A) NEGATIVE mg/dL   Protein, ur NEGATIVE NEGATIVE mg/dL   Nitrite NEGATIVE NEGATIVE   Leukocytes,Ua NEGATIVE NEGATIVE  HIV Antibody (routine testing w rflx)   Collection Time: 07/24/24  5:12 AM  Result Value Ref Range   HIV Screen 4th Generation wRfx Non Reactive Non Reactive  Comprehensive metabolic panel   Collection Time: 07/24/24  5:12 AM  Result Value Ref Range   Sodium 139 135 - 145 mmol/L   Potassium 4.2 3.5 - 5.1 mmol/L   Chloride 102 98 - 111 mmol/L   CO2 23 22 - 32 mmol/L   Glucose, Bld 112 (H) 70 - 99 mg/dL   BUN 18 8 - 23 mg/dL   Creatinine, Ser 9.21 0.61 - 1.24 mg/dL   Calcium  9.1 8.9 - 10.3 mg/dL   Total Protein 7.2 6.5 - 8.1 g/dL   Albumin 3.9 3.5 - 5.0 g/dL   AST 19 15 - 41 U/L   ALT 14 0 - 44 U/L   Alkaline Phosphatase 113 38 - 126 U/L   Total Bilirubin 0.7 0.0 - 1.2 mg/dL   GFR, Estimated  >39 >39 mL/min   Anion gap 14 5 - 15  CBC   Collection Time: 07/24/24  5:12 AM  Result Value Ref Range   WBC 6.8 4.0 - 10.5 K/uL   RBC 3.68 (L) 4.22 - 5.81 MIL/uL   Hemoglobin 12.1 (L) 13.0 - 17.0 g/dL   HCT 63.1 (L) 60.9 - 47.9 %   MCV 100.0 80.0 - 100.0 fL   MCH 32.9 26.0 - 34.0 pg   MCHC 32.9 30.0 - 36.0 g/dL   RDW 84.7 88.4 - 84.4 %   Platelets 148 (L) 150 - 400 K/uL   nRBC 0.0 0.0 - 0.2 %  Protime-INR   Collection Time: 07/24/24  5:12 AM  Result Value Ref Range   Prothrombin Time 15.5 (H) 11.4 - 15.2 seconds   INR 1.2 0.8 - 1.2  Type and screen [facility]   Collection Time: 07/25/24 12:50 PM  Result Value Ref Range   ABO/RH(D) A NEG    Antibody Screen NEG    Sample Expiration      07/28/2024,2359 Performed at [facility], [address], [place], [place] [address]   ABO/Rh   Collection Time: 07/25/24  1:49 PM  Result Value Ref Range   ABO/RH(D)      A NEG Performed at [facility], [address], [place], [place] [address]     MR BRAIN W WO CONTRAST Result Date: 07/26/2024 EXAM: MRI BRAIN WITH AND WITHOUT CONTRAST 07/26/2024 12:43:33 AM TECHNIQUE: Multiplanar multisequence MRI of the head/brain was performed with and without the administration of intravenous contrast. CONTRAST: 8 mL of Gadobutrol . COMPARISON: MR Head Without and With Contrast 07/23/2024. CLINICAL HISTORY: Brain metastases, assess treatment response. FINDINGS: BRAIN AND VENTRICLES: Status post right suboccipital craniectomy. The right cerebellar lesion has decreased in size and now measures 2.9 cm (series 16, image 15). The lesion in the left occipital lobe is unchanged measuring 1.8 cm (image 22). The lesion of the right frontal lobe is unchanged at 10 mm (image 44). There are blood products and vasogenic edema associated with the left occipital and right cerebellar lesions. The right frontal lesion also has mild surrounding edema. There is persistent mild leftward midline  shift within the posterior fossa with mass effect on the 4th ventricle. Unchanged size and configuration of the lateral and third ventricles. Small amount of  anterior right convexity subdural blood and postoperative subdural blood in the right posterior fossa. Normal flow voids. No acute infarct. No hydrocephalus. The sella is unremarkable. ORBITS: No acute abnormality. SINUSES: No acute abnormality. BONES AND SOFT TISSUES: Normal bone marrow signal and enhancement. No acute soft tissue abnormality. Status post right suboccipital craniectomy. IMPRESSION: 1. Status post right suboccipital craniectomy for treatment of right cerebellar lesion, which has decreased in size to 2.9 cm with associated blood products and vasogenic edema. 2. Unchanged left occipital lobe lesion measuring 1.8 cm and right frontal lobe lesion measuring 10 mm. 3. Persistent mild leftward midline shift within the posterior fossa with mass effect on the fourth ventricle. 4. Small amount of anterior right convexity and right posterior fossa subdural blood. Electronically signed by: Franky Stanford MD MD 07/26/2024 01:04 AM EST RP Workstation: [id number]   MR Brain W and Wo Contrast Result Date: 07/23/2024 EXAM: MRI BRAIN WITH AND WITHOUT CONTRAST 07/23/2024 07:10:00 PM TECHNIQUE: Multiplanar multisequence MRI of the head/brain was performed with and without the administration of 7 mL of gadobutrol  (GADAVIST ) 1 MMOL/ML injection. COMPARISON: Same day CT head. CLINICAL HISTORY: brain mass on ct brain mass on ct FINDINGS: BRAIN AND VENTRICLES: No acute infarct. No midline shift. No hydrocephalus. The sella is unremarkable. Normal flow voids. There is a 3.7 x 3.6 x 2.8 cm dominant heterogeneously enhancing mass in the right cerebellum with surrounding edema extending into the superior cerebellum and cerebellar vermis. This is associated with mass effect on the fourth ventricle and the right middle cerebellar peduncle. An additional 2.2 x 1.5 x 3.1 cm  mass is present in the left occipital lobe with surrounding vasogenic edema and local mass effect. Edema partially extends to the left posterior aspect of the splenium and the corpus callosum. An additional 1.2 x 1.2 x 1.3 cm enhancing lesion is noted in the anterior right frontal lobe involving the right middle frontal gyrus with mild associated vasogenic edema. The left occipital and right cerebellar lesions demonstrate prominent areas of susceptibility suggestive of intralesional hemorrhage. Edema extending into the left periatrial white matter results in partial effacement of the atrium of the left lateral ventricle as well as effacement of the left occipital and left temporal horns. Mass effect in the posterior fossa results in anterior displacement of the brainstem with crowding of the prepontine and cerebellopontine angle cisterns. There is anterior displacement and mass effect on the basilar artery without disruption of the flow void. There is mild ectopia of the right cerebellar tonsil extending below the foramen magnum by approximately 2.5 mm. Few scattered areas of T2 and FLAIR hyperintensity are present in the periventricular and subcortical white matter suggestive of mild chronic microvascular ischemic changes. ORBITS: No acute abnormality. SINUSES: Defect in the anterior nasal septum. BONES AND SOFT TISSUES: Normal bone marrow signal and enhancement. No acute soft tissue abnormality. IMPRESSION: 1. Dominant 3.7 x 3.6 x 2.8 cm heterogeneously enhancing mass in the right cerebellum with surrounding edema, mass effect on the fourth ventricle and right middle cerebellar peduncle, intralesional hemorrhage, and posterior fossa mass effect resulting in anterior displacement of the brainstem and basilar artery. 2. 2.2 x 1.5 x 3.1 cm mass in the left occipital lobe with surrounding edema, local mass effect, partial effacement of the left lateral ventricle. 3. 1.2 x 1.2 x 1.3 cm enhancing lesion in the anterior  right frontal lobe with mild associated vasogenic edema. 4. Findings concerning for intracranial metastatic disease. Electronically signed by: Donnice Mania MD MD 07/23/2024 08:52 PM  EST RP Workstation: [id number]   CT CHEST ABDOMEN PELVIS W CONTRAST Result Date: 07/23/2024 EXAM: CT CHEST, ABDOMEN AND PELVIS WITH CONTRAST 07/23/2024 07:34:49 PM TECHNIQUE: CT of the chest, abdomen and pelvis was performed with the administration of 100 mL of iohexol  (OMNIPAQUE ) 300 MG/ML solution. Multiplanar reformatted images are provided for review. Automated exposure control, iterative reconstruction, and/or weight based adjustment of the mA/kV was utilized to reduce the radiation dose to as low as reasonably achievable. COMPARISON: Chest radiograph 07/20/2024, CT chest 06/19/2024, and CT abdomen and pelvis 05/06/2024. CLINICAL HISTORY: Metastatic disease evaluation; metastatic lung cancer. Ongoing nausea, vomiting, dizziness, and headaches. FINDINGS: CHEST: MEDIASTINUM AND LYMPH NODES: Left hilar mass measures 2.3 x 2.3 cm, unchanged since prior study. This corresponds to non-treated neoplasm. No significant lymphadenopathy. The esophagus is not abnormally distended, but there is fluid in the esophagus suggesting reflux or dysmotility. Right central venous catheter with tip in the low SVC. Heart size is normal. No pericardial effusion. Calcification of the aorta. No aortic aneurysm or dissection. Central pulmonary arteries are patent without evidence of large pulmonary embolus. LUNGS AND PLEURA: Diffuse emphysematous changes in the lungs with scattered subpleural fibrosis. No focal consolidation or pulmonary edema. No pleural effusion or pneumothorax. Mild traction bronchiectasis in the bases. Scattered pulmonary nodules including representative nodule in the right upper lung, series 4 image 92, measuring 5 mm diameter. No change since previous study. ABDOMEN AND PELVIS: LIVER: Mild diffuse fatty infiltration of the liver.  GALLBLADDER AND BILE DUCTS: Gallbladder and bile ducts are normal. No biliary ductal dilatation. SPLEEN: Spleen is normal. PANCREAS: Pancreas is normal. ADRENAL GLANDS: Adrenal glands are normal. KIDNEYS, URETERS AND BLADDER: Kidneys, ureters, and bladder are normal. No stones in the kidneys or ureters. No hydronephrosis. No perinephric or periureteral stranding. Urinary bladder is unremarkable. GI AND BOWEL: The stomach, small bowel, and colon are not abnormally distended. The stomach is filled with ingested material. The colon is stool-filled. No wall thickening or inflammatory stranding is noted. The appendix is normal. There is no bowel obstruction. REPRODUCTIVE ORGANS: Prostate gland is enlarged. PERITONEUM AND RETROPERITONEUM: No ascites. No free air. VASCULATURE: Aorta is normal in caliber. Calcification of the aorta. No aneurysm. ABDOMINAL AND PELVIS LYMPH NODES: No lymphadenopathy. BONES AND SOFT TISSUES: Degenerative changes in the spine. No focal bone lesions. Compression of the L4 vertebra, unchanged since prior study. No focal soft tissue abnormality. IMPRESSION: 1. Left hilar mass measuring 2.3 x 2.3 cm, unchanged since prior study, consistent with known non-treated neoplasm. 2. No evidence of metastatic disease in the chest, abdomen, or pelvis. 3. Scattered pulmonary nodules, including a 5 mm right upper lobe nodule, unchanged since previous study. 4. Fluid in the esophagus, which can be seen with reflux or dysmotility. Electronically signed by: Elsie Gravely MD 07/23/2024 07:50 PM EST RP Workstation: [id number]   CT Head Wo Contrast Result Date: 07/23/2024 CLINICAL DATA:  New onset headache EXAM: CT HEAD WITHOUT CONTRAST TECHNIQUE: Contiguous axial images were obtained from the base of the skull through the vertex without intravenous contrast. RADIATION DOSE REDUCTION: This exam was performed according to the departmental dose-optimization program which includes automated exposure control,  adjustment of the mA and/or kV according to patient size and/or use of iterative reconstruction technique. COMPARISON:  MRI 02/28/2024 FINDINGS: Brain: Interval development of a hyperdense mass within the left occipital lobe, this measures 2.8 x 1.8 by 2.9 cm. Considerable surrounding vasogenic edema. Interval heterogenous low-density in the right cerebellum with scattered areas of hyperdensity. Mass effect on  fourth ventricle which is narrowed and displaced to the left. Hypodense edema probably extends to the right posterior pons. Additional focus of low density in the right frontal lobe. The ventricles are slightly enlarged compared with the prior MRI. No midline shift Vascular: No hyperdense vessels.  No unexpected calcification Skull: Normal. Negative for fracture or focal lesion. Sinuses/Orbits: No acute finding. Other: None IMPRESSION: 1. Interval development of a 2.9 cm hyperdense (?hemorrhagic) mass within the left occipital lobe with considerable surrounding vasogenic edema. Interval heterogenous low-density in the right cerebellum with scattered areas of hyperdensity, also suspicious for mass lesion but with possible petechial foci of hemorrhage or mineralization. Additional focus of low density in the right frontal lobe. Findings are suspicious for metastatic disease. Further evaluation with MRI with and without contrast is recommended. 2. Mass effect on the fourth ventricle which is narrowed and displaced to the left. Interval mild lateral and third ventricular enlargement compared with the prior MRI. Critical Value/emergent results were called by telephone at the time of interpretation on 07/23/2024 at 5:28 pm to provider Dr. Gennaro, Who verbally acknowledged these results. Electronically Signed   By: Luke Bun M.D.   On: 07/23/2024 17:30   DG Chest Port 1 View Result Date: 07/20/2024 EXAM: 1 VIEW(S) XRAY OF THE CHEST 07/20/2024 09:41:00 PM COMPARISON: Comparison with 06/01/2024. CLINICAL HISTORY:  Nausea, vomiting, and dizziness for 2 days. FINDINGS: LINES, TUBES AND DEVICES: Port-type central venous catheter with tip over the low SVC (Superior Vena Cava) region. LUNGS AND PLEURA: Lungs are clear. Pulmonary vascularity is normal. No pleural effusion. No pneumothorax. HEART AND MEDIASTINUM: Heart size is normal. Mediastinal contours appear intact. Calcification of the aorta. BONES AND SOFT TISSUES: Postoperative changes in the right shoulder. No acute osseous abnormality. IMPRESSION: 1. No acute cardiopulmonary abnormality. Electronically signed by: Elsie Gravely MD 07/20/2024 09:54 PM EST RP Workstation: [id number]    Antibiotics:  Anti-infectives (From admission, onward)    Start     Dose/Rate Route Frequency Ordered Stop   07/26/24 0600  ceFAZolin  (ANCEF ) IVPB 2g/100 mL premix  Status:  Discontinued        2 g 200 mL/hr over 30 Minutes Intravenous On call to O.R. 07/25/24 1249 07/25/24 1833   07/25/24 1246  ceFAZolin  (ANCEF ) 2-4 GM/100ML-% IVPB       Note to Pharmacy: Coni Sensor M: cabinet override      07/25/24 1246 07/26/24 0059       Discharge Exam: Blood pressure (!) 153/93, pulse (!) 106, temperature 97.9 F (36.6 C), temperature source Oral, resp. rate 12, height 5' 11 (1.803 m), weight 74.8 kg, SpO2 91%. Neurologic: Grossly normal with some R visual field distortion Wound CDI  Discharge Medications:   Allergies as of 07/29/2024       Reactions   Ibuprofen    GI Upset   Isosorbide  Nitrate Other (See Comments)   CONTINUOUS HEADACHE    Oxycodone  Itching   Reaction was to straight oxycodone  15 mg tablets - no reaction to percocet   Penicillins Hives, Rash   Has patient had a PCN reaction causing immediate rash, facial/tongue/throat swelling, SOB or lightheadedness with hypotension: Yes Has patient had a PCN reaction causing severe rash involving mucus membranes or skin necrosis: No Has patient had a PCN reaction that required hospitalization No Has patient had  a PCN reaction occurring within the last 10 years: No If all of the above answers are NO, then may proceed with Cephalosporin use.  Medication List     TAKE these medications    Aerochamber Plus Device Per patient request to help with inhaler management and effectiveness.   albuterol  108 (90 Base) MCG/ACT inhaler Commonly known as: VENTOLIN  HFA Inhale 2 puffs into the lungs every 6 (six) hours as needed for wheezing or shortness of breath.   albuterol  (2.5 MG/3ML) 0.083% nebulizer solution Commonly known as: PROVENTIL  Take 3 mLs (2.5 mg total) by nebulization every 6 (six) hours as needed for wheezing or shortness of breath.   ALPRAZolam  0.5 MG tablet Commonly known as: XANAX  Take 1 tablet (0.5 mg total) by mouth at bedtime as needed for anxiety.   arformoterol  15 MCG/2ML Nebu Commonly known as: Brovana  Take 2 mLs (15 mcg total) by nebulization 2 (two) times daily.   aspirin  EC 81 MG tablet Take 81 mg by mouth daily.   atorvastatin  10 MG tablet Commonly known as: LIPITOR Take 1 tablet (10 mg total) by mouth daily.   Breztri  Aerosphere 160-9-4.8 MCG/ACT Aero inhaler Generic drug: budesonide -glycopyrrolate -formoterol Inhale 2 puffs into the lungs in the morning and at bedtime. What changed: how much to take   Breztri  Aerosphere 160-9-4.8 MCG/ACT Aero inhaler Generic drug: budesonide -glycopyrrolate -formoterol Inhale 2 puffs into the lungs in the morning and at bedtime. What changed: Another medication with the same name was changed. Make sure you understand how and when to take each.   budesonide  0.25 MG/2ML nebulizer solution Commonly known as: PULMICORT  Take 2 mLs (0.25 mg total) by nebulization 2 (two) times daily.   clotrimazole  10 MG troche Commonly known as: MYCELEX  Take 1 tablet (10 mg total) by mouth 3 (three) times daily. What changed: when to take this   colchicine  0.6 MG tablet Take 1 tablet (0.6 mg total) by mouth daily as needed (gout  flares).   dexamethasone  4 MG tablet Commonly known as: DECADRON  Take 1 tablet (4 mg total) by mouth 3 (three) times daily.   docusate sodium  100 MG capsule Commonly known as: COLACE Take 1 capsule (100 mg total) by mouth every 12 (twelve) hours.   HYDROcodone -acetaminophen  5-325 MG tablet Commonly known as: NORCO/VICODIN Take 1 tablet by mouth every 6 (six) hours as needed for moderate pain (pain score 4-6).   ipratropium 0.02 % nebulizer solution Commonly known as: ATROVENT  Take 2.5 mLs (0.5 mg total) by nebulization 4 (four) times daily. What changed:  when to take this reasons to take this   lidocaine  2 % solution Commonly known as: XYLOCAINE  Use as directed 15 mLs in the mouth or throat every 4 (four) hours as needed for mouth pain.   lisinopril  10 MG tablet Commonly known as: ZESTRIL  Take 1 tablet (10 mg total) by mouth daily.   Maalox Max 400-400-40 MG/5ML suspension Generic drug: alum & mag hydroxide-simeth Take 15 mLs by mouth every 6 (six) hours as needed for indigestion.   metoCLOPramide  10 MG tablet Commonly known as: REGLAN  Take 1 tablet (10 mg total) by mouth every 8 (eight) hours as needed for nausea.   nitroGLYCERIN  0.4 MG SL tablet Commonly known as: NITROSTAT  Place 1 tablet (0.4 mg total) under the tongue every 5 (five) minutes as needed for chest pain.   NON FORMULARY Pt uses a c-pap nightly   omeprazole  20 MG capsule Commonly known as: PRILOSEC  Take 1 capsule (20 mg total) by mouth daily.   ondansetron  4 MG disintegrating tablet Commonly known as: ZOFRAN -ODT Take 1 tablet (4 mg total) by mouth every 8 (eight) hours as needed.   polyethylene  glycol powder 17 GM/SCOOP powder Commonly known as: MiraLax  Take 17 g by mouth daily. Dissolve 1 capful (17g) in 4-8 ounces of liquid and take by mouth daily. What changed:  when to take this reasons to take this   sertraline  50 MG tablet Commonly known as: ZOLOFT  Take 2 tabs in the morning and 1 tab  in the evening. What changed:  how much to take how to take this when to take this   sucralfate  1 g tablet Commonly known as: Carafate  Take 1 tablet (1 g total) by mouth 4 (four) times daily. Dissolve each tablet in 15 cc water before use.   tiZANidine  4 MG tablet Commonly known as: Zanaflex  Take 1 tablet (4 mg total) by mouth at bedtime as needed for muscle spasms.   triamcinolone  cream 0.1 % Commonly known as: KENALOG  Apply 1 Application topically 2 (two) times daily as needed (itch).               Durable Medical Equipment  (From admission, onward)           Start     Ordered   07/29/24 0818  DME Vannie  Once       Question Answer Comment  Walker: With 5 Inch Wheels   Patient needs a walker to treat with the following condition Gait disturbance      07/29/24 0819            Disposition: home with [facility]   Final Dx: craniotomy for brain met  Discharge Instructions     Call MD for:  difficulty breathing, headache or visual disturbances   Complete by: As directed    Call MD for:  persistant nausea and vomiting   Complete by: As directed    Call MD for:  redness, tenderness, or signs of infection (pain, swelling, redness, odor or green/yellow discharge around incision site)   Complete by: As directed    Call MD for:  severe uncontrolled pain   Complete by: As directed    Call MD for:  temperature >100.4   Complete by: As directed    Increase activity slowly   Complete by: As directed    No wound care   Complete by: As directed         Follow-up Information     Joshua Alm Hamilton, MD. Schedule an appointment as soon as possible for a visit in 9 day(s).   Specialty: Neurosurgery Contact information: [address] [place] [address] [phone]                  Signed: Alm GORMAN Joshua 07/29/2024, 8:19 AM

## 2024-07-30 ENCOUNTER — Other Ambulatory Visit: Payer: Self-pay | Admitting: Radiation Therapy

## 2024-07-30 ENCOUNTER — Telehealth: Payer: Self-pay

## 2024-07-30 DIAGNOSIS — C7931 Secondary malignant neoplasm of brain: Secondary | ICD-10-CM

## 2024-07-30 LAB — SURGICAL PATHOLOGY

## 2024-07-30 NOTE — Transitions of Care (Post Inpatient/ED Visit) (Signed)
" ° °  07/30/2024  Name: Charles Grimes MRN: [id number] DOB: August 21, 1955  Today's TOC FU Call Status: Today's TOC FU Call Status:: Unsuccessful Call (1st Attempt) Unsuccessful Call (1st Attempt) Date: 07/30/24  Attempted to reach the patient regarding the most recent Inpatient/ED visit.  Follow Up Plan: Additional outreach attempts will be made to reach the patient to complete the Transitions of Care (Post Inpatient/ED visit) call.   Medford Balboa, BSN, RN [place]  VBCI - Population Health RN Care Manager [phone]  "

## 2024-07-31 ENCOUNTER — Telehealth: Payer: Self-pay | Admitting: Radiation Therapy

## 2024-07-31 ENCOUNTER — Telehealth: Payer: Self-pay

## 2024-07-31 NOTE — Telephone Encounter (Signed)
 I called to introduce myself and to review the radiation treatment planning appointments we have set up. Charles Grimes son, Charles Grimes, answered . Charles Grimes said that his number is listed as a contact b/c his father doesn't answer the phone most of the time.  Charles Grimes and I discussed the planned appointment dates and times. We reviewed what each appointment is for and that there are no restrictions for any.  He has my contact information to call back with any questions. I also encouraged him to have his father call if he wanted to review things with me as well.   Devere Perch R.T(R)(T) Radiation Special Procedures Lead

## 2024-07-31 NOTE — Progress Notes (Unsigned)
 [facility] OFFICE PROGRESS NOTE  [name], DO [address] [place] [address]  DIAGNOSIS: Stage IV lung cancer, initially diagnosed as  non-small cell lung cancer, adenocarcinoma presented with left suprahilar mass in addition to suspicious subcarinal lymphadenopathy diagnosed in August 2025. 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    PDL1: 0%   Molecular studies: ***  PRIOR THERAPY: 1) A course of concurrent chemoradiation with weekly carboplatin  for AUC 2 and paclitaxel  45 MGs/M2.  Status post 8 cycles.  Last dose of treatment on 05/26/2024  2) Consolidation immunotherapy with Imfinzi  1500 mg IV every 4 weeks. First dose expected on 07/09/2024. Status post 1 cycle.  3) status post suboccipital craniectomy for tumor  of Dr. Joshua on 07/25/24.   CURRENT THERAPY: 1) SRS to the metastatic disease to the brain expected on 08/20/24.  2) ***  INTERVAL HISTORY: Charles Grimes 69 y.o. male returns to the clinic today for a follow-up visit accompanied by her son and daughter in a law.   He was last seen in the clinic on 07/01/25.   He started consolidation immunotherapy with Imfinzi . He called the clinic endorsing *** on ***.   He presented to the ER on 07/20/24 for nausea and emesis. He returned to the ER on 07/22/24 due to decreased oral intake, lightheadedness, and dizziness. He also was endorsing imbalance, headache and facial pain. He returned to the ER on 07/23/24.His CT of the head showed new metastatic disease to the brain. He was seen by neurosurgery. He underwent resection under the care of Dr. Joshua on 07/25/24. He is currently on decadron  ***  He He has some occasional intermittent cough produces a small amount of phlegm that is stable. He denies any hemoptysis.  He sometimes experiences right lateral rib soreness. It lasts about 10-15 minutes then improves without intervention. Tylenol  provides some relief.      He denies constipation. He denies diarrhea.  He denies nausea and vomiting. He is here for evaluation and repeat blood work before undergoing cycle #2.    MEDICAL HISTORY: Past Medical History:  Diagnosis Date   Anginal pain    Anxiety    Arthritis    Coronary artery disease, non-occlusive 08/2013   Mild to moderate (40% OM1) single vessel CAD. Otherwise nonobstructed.   Depression    Dyslipidemia, goal LDL below 100    Essential hypertension    Family history of premature CAD    GERD (gastroesophageal reflux disease)    H/O skin disorder    Involving hands. Subsequently resolved.    Nonischemic cardiomyopathy (HCC) 05/2013   Non-ischemic: EF ~45% by Echo & Myoview  --> Non-obstructive CAD   Obesity (BMI 30.0-34.9)    OSA (obstructive sleep apnea) 06/21/2017   uses CPAP   Sleep apnea     ALLERGIES:  is allergic to ibuprofen, isosorbide  nitrate, oxycodone , and penicillins.  MEDICATIONS:  Current Outpatient Medications  Medication Sig Dispense Refill   albuterol  (PROVENTIL ) (2.5 MG/3ML) 0.083% nebulizer solution Take 3 mLs (2.5 mg total) by nebulization every 6 (six) hours as needed for wheezing or shortness of breath. 75 mL 12   albuterol  (VENTOLIN  HFA) 108 (90 Base) MCG/ACT inhaler Inhale 2 puffs into the lungs every 6 (six) hours as needed for wheezing or shortness of breath. 8 g 2   ALPRAZolam  (XANAX ) 0.5 MG tablet Take 1 tablet (0.5 mg total) by mouth at bedtime as needed for anxiety.  30 tablet 0   alum & mag hydroxide-simeth (MAALOX MAX) 400-400-40 MG/5ML suspension Take 15 mLs by mouth every 6 (six) hours as needed for indigestion. 355 mL 0   arformoterol  (BROVANA ) 15 MCG/2ML NEBU Take 2 mLs (15 mcg total) by nebulization 2 (two) times daily. 120 mL 6   aspirin  EC 81 MG tablet Take 81 mg by mouth daily.     atorvastatin  (LIPITOR) 10 MG tablet Take 1 tablet (10 mg total) by mouth daily. 90 tablet 3   budesonide  (PULMICORT ) 0.25 MG/2ML nebulizer solution Take 2 mLs (0.25 mg  total) by nebulization 2 (two) times daily. 60 mL 2   budesonide -glycopyrrolate -formoterol (BREZTRI  AEROSPHERE) 160-9-4.8 MCG/ACT AERO inhaler Inhale 2 puffs into the lungs in the morning and at bedtime. (Patient taking differently: Inhale 1 puff into the lungs in the morning and at bedtime.) 10.7 each 6   budesonide -glycopyrrolate -formoterol (BREZTRI  AEROSPHERE) 160-9-4.8 MCG/ACT AERO inhaler Inhale 2 puffs into the lungs in the morning and at bedtime.     clotrimazole  (MYCELEX ) 10 MG troche Take 1 tablet (10 mg total) by mouth 3 (three) times daily. (Patient taking differently: Take 10 mg by mouth daily.) 21 Troche 0   colchicine  0.6 MG tablet Take 1 tablet (0.6 mg total) by mouth daily as needed (gout flares).     dexamethasone  (DECADRON ) 4 MG tablet Take 1 tablet (4 mg total) by mouth 3 (three) times daily. 90 tablet 0   docusate sodium  (COLACE) 100 MG capsule Take 1 capsule (100 mg total) by mouth every 12 (twelve) hours. 60 capsule 0   HYDROcodone -acetaminophen  (NORCO/VICODIN) 5-325 MG tablet Take 1 tablet by mouth every 6 (six) hours as needed for moderate pain (pain score 4-6). 25 tablet 0   ipratropium (ATROVENT ) 0.02 % nebulizer solution Take 2.5 mLs (0.5 mg total) by nebulization 4 (four) times daily. (Patient taking differently: Take 0.5 mg by nebulization every 4 (four) hours as needed for wheezing or shortness of breath.) 240 each 12   lidocaine  (XYLOCAINE ) 2 % solution Use as directed 15 mLs in the mouth or throat every 4 (four) hours as needed for mouth pain. 100 mL 0   lisinopril  (ZESTRIL ) 10 MG tablet Take 1 tablet (10 mg total) by mouth daily. 90 tablet 3   metoCLOPramide  (REGLAN ) 10 MG tablet Take 1 tablet (10 mg total) by mouth every 8 (eight) hours as needed for nausea. 30 tablet 0   nitroGLYCERIN  (NITROSTAT ) 0.4 MG SL tablet Place 1 tablet (0.4 mg total) under the tongue every 5 (five) minutes as needed for chest pain. 30 tablet 1   NON FORMULARY Pt uses a c-pap nightly      omeprazole  (PRILOSEC ) 20 MG capsule Take 1 capsule (20 mg total) by mouth daily. 90 capsule 3   ondansetron  (ZOFRAN -ODT) 4 MG disintegrating tablet Take 1 tablet (4 mg total) by mouth every 8 (eight) hours as needed. 12 tablet 0   polyethylene glycol powder (MIRALAX ) 17 GM/SCOOP powder Take 17 g by mouth daily. Dissolve 1 capful (17g) in 4-8 ounces of liquid and take by mouth daily. (Patient taking differently: Take 17 g by mouth as needed for moderate constipation. Dissolve 1 capful (17g) in 4-8 ounces of liquid and take by mouth daily.) 238 g 0   sertraline  (ZOLOFT ) 50 MG tablet Take 2 tabs in the morning and 1 tab in the evening. (Patient taking differently: Take 50 mg by mouth 2 (two) times daily. Take 2 tabs in the morning and 1 tab in the evening.)  270 tablet 3   Spacer/Aero-Holding Chambers (AEROCHAMBER PLUS) Device Per patient request to help with inhaler management and effectiveness. 1 each 0   sucralfate  (CARAFATE ) 1 g tablet Take 1 tablet (1 g total) by mouth 4 (four) times daily. Dissolve each tablet in 15 cc water before use. (Patient not taking: Reported on 07/26/2024) 120 tablet 2   tiZANidine  (ZANAFLEX ) 4 MG tablet Take 1 tablet (4 mg total) by mouth at bedtime as needed for muscle spasms. 30 tablet 2   triamcinolone  cream (KENALOG ) 0.1 % Apply 1 Application topically 2 (two) times daily as needed (itch). 30 g 1   No current facility-administered medications for this visit.    SURGICAL HISTORY:  Past Surgical History:  Procedure Laterality Date   COLONOSCOPY  2015   COLONOSCOPY WITH PROPOFOL   01/02/2017   Dr.Pyrtle   CRANIOTOMY Right 07/25/2024   Procedure: CRANIOTOMY TUMOR EXCISION;  Surgeon: Joshua Alm Hamilton, MD;  Location: [facility] OR;  Service: Neurosurgery;  Laterality: Right;   IR IMAGING GUIDED PORT INSERTION  04/02/2024   LEFT HEART CATHETERIZATION WITH CORONARY ANGIOGRAM  08/26/2013   Mild to moderate disease with 40-50% stenosis and OM1. Otherwise no significant CAD --   Surgeon: Alm LELON Clay, MD;  Location: [facility] CATH LAB;  Service: Cardiovascular;;   Lower extremity arterial Dopplers  06/11/2013   No occlusive disease   LUMBAR LAMINECTOMY/DECOMPRESSION MICRODISCECTOMY Right 10/07/2014   Procedure: LUMBAR LAMINECTOMY/DECOMPRESSION MICRODISCECTOMY 1 LEVEL;  Surgeon: Arley Helling, MD;  Location: [facility] NEURO ORS;  Service: Neurosurgery;  Laterality: Right;  Right L3-L4 Microdiscectomy   NM MYOVIEW  LTD  06/03/2013   Low risk study with no ischemia. EF roughly 44% with no regional wall motion abnormalities noted   PFTs  06/16/2013   Normal Volumes &  Spirometry; Moderately reduced DLCO   POLYPECTOMY     SHOULDER HEMI-ARTHROPLASTY Right 05/28/2015   Procedure: RIGHT SHOULDER HEMI-ARTHROPLASTY CTA HEAD AND SUBSCAP REPAIR ;  Surgeon: Marcey Her, MD;  Location: [facility] OR;  Service: Orthopedics;  Laterality: Right;   TRANSTHORACIC ECHOCARDIOGRAM  06/11/2013   Mildly reduced EF: 45-50%. Mild anterior hypokinesis with incoordinate septal motion. No evidence of pulmonary hypertension   VIDEO BRONCHOSCOPY WITH ENDOBRONCHIAL ULTRASOUND Left 02/19/2024   Procedure: BRONCHOSCOPY, WITH EBUS;  Surgeon: Shelah Lamar RAMAN, MD;  Location: [facility] ENDOSCOPY;  Service: Pulmonary;  Laterality: Left;    REVIEW OF SYSTEMS:   Review of Systems  Constitutional: Negative for appetite change, chills, fatigue, fever and unexpected weight change.  HENT:   Negative for mouth sores, nosebleeds, sore throat and trouble swallowing.   Eyes: Negative for eye problems and icterus.  Respiratory: Negative for cough, hemoptysis, shortness of breath and wheezing.   Cardiovascular: Negative for chest pain and leg swelling.  Gastrointestinal: Negative for abdominal pain, constipation, diarrhea, nausea and vomiting.  Genitourinary: Negative for bladder incontinence, difficulty urinating, dysuria, frequency and hematuria.   Musculoskeletal: Negative for back pain, gait problem, neck pain and neck stiffness.  Skin:  Negative for itching and rash.  Neurological: Negative for dizziness, extremity weakness, gait problem, headaches, light-headedness and seizures.  Hematological: Negative for adenopathy. Does not bruise/bleed easily.  Psychiatric/Behavioral: Negative for confusion, depression and sleep disturbance. The patient is not nervous/anxious.     PHYSICAL EXAMINATION:  There were no vitals taken for this visit.  ECOG PERFORMANCE STATUS: {CHL ONC ECOG [id number]  Physical Exam  Constitutional: Oriented to person, place, and time and well-developed, well-nourished, and in no distress. No distress.  HENT:  Head: Normocephalic and atraumatic.  Mouth/Throat: Oropharynx is clear and moist. No oropharyngeal exudate.  Eyes: Conjunctivae are normal. Right eye exhibits no discharge. Left eye exhibits no discharge. No scleral icterus.  Neck: Normal range of motion. Neck supple.  Cardiovascular: Normal rate, regular rhythm, normal heart sounds and intact distal pulses.   Pulmonary/Chest: Effort normal and breath sounds normal. No respiratory distress. No wheezes. No rales.  Abdominal: Soft. Bowel sounds are normal. Exhibits no distension and no mass. There is no tenderness.  Musculoskeletal: Normal range of motion. Exhibits no edema.  Lymphadenopathy:    No cervical adenopathy.  Neurological: Alert and oriented to person, place, and time. Exhibits normal muscle tone. Gait normal. Coordination normal.  Skin: Skin is warm and dry. No rash noted. Not diaphoretic. No erythema. No pallor.  Psychiatric: Mood, memory and judgment normal.  Vitals reviewed.  LABORATORY DATA: Lab Results  Component Value Date   WBC 6.8 07/24/2024   HGB 12.1 (L) 07/24/2024   HCT 36.8 (L) 07/24/2024   MCV 100.0 07/24/2024   PLT 148 (L) 07/24/2024      Chemistry      Component Value Date/Time   NA 139 07/24/2024 0512   K 4.2 07/24/2024 0512   CL 102 07/24/2024 0512   CO2 23 07/24/2024 0512   BUN 18 07/24/2024 0512    CREATININE 0.78 07/24/2024 0512   CREATININE 1.25 (H) 07/08/2024 1230   CREATININE 0.84 04/30/2015 1738      Component Value Date/Time   CALCIUM  9.1 07/24/2024 0512   ALKPHOS 113 07/24/2024 0512   AST 19 07/24/2024 0512   AST 18 07/08/2024 1230   ALT 14 07/24/2024 0512   ALT 9 07/08/2024 1230   BILITOT 0.7 07/24/2024 0512   BILITOT 0.5 07/08/2024 1230       RADIOGRAPHIC STUDIES:  MR BRAIN W WO CONTRAST Result Date: 07/26/2024 EXAM: MRI BRAIN WITH AND WITHOUT CONTRAST 07/26/2024 12:43:33 AM TECHNIQUE: Multiplanar multisequence MRI of the head/brain was performed with and without the administration of intravenous contrast. CONTRAST: 8 mL of Gadobutrol . COMPARISON: MR Head Without and With Contrast 07/23/2024. CLINICAL HISTORY: Brain metastases, assess treatment response. FINDINGS: BRAIN AND VENTRICLES: Status post right suboccipital craniectomy. The right cerebellar lesion has decreased in size and now measures 2.9 cm (series 16, image 15). The lesion in the left occipital lobe is unchanged measuring 1.8 cm (image 22). The lesion of the right frontal lobe is unchanged at 10 mm (image 44). There are blood products and vasogenic edema associated with the left occipital and right cerebellar lesions. The right frontal lesion also has mild surrounding edema. There is persistent mild leftward midline shift within the posterior fossa with mass effect on the 4th ventricle. Unchanged size and configuration of the lateral and third ventricles. Small amount of anterior right convexity subdural blood and postoperative subdural blood in the right posterior fossa. Normal flow voids. No acute infarct. No hydrocephalus. The sella is unremarkable. ORBITS: No acute abnormality. SINUSES: No acute abnormality. BONES AND SOFT TISSUES: Normal bone marrow signal and enhancement. No acute soft tissue abnormality. Status post right suboccipital craniectomy. IMPRESSION: 1. Status post right suboccipital craniectomy for  treatment of right cerebellar lesion, which has decreased in size to 2.9 cm with associated blood products and vasogenic edema. 2. Unchanged left occipital lobe lesion measuring 1.8 cm and right frontal lobe lesion measuring 10 mm. 3. Persistent mild leftward midline shift within the posterior fossa with mass effect on the fourth ventricle. 4. Small amount of anterior right convexity and right posterior  fossa subdural blood. Electronically signed by: Franky Stanford MD MD 07/26/2024 01:04 AM EST RP Workstation: [id number]   MR Brain W and Wo Contrast Result Date: 07/23/2024 EXAM: MRI BRAIN WITH AND WITHOUT CONTRAST 07/23/2024 07:10:00 PM TECHNIQUE: Multiplanar multisequence MRI of the head/brain was performed with and without the administration of 7 mL of gadobutrol  (GADAVIST ) 1 MMOL/ML injection. COMPARISON: Same day CT head. CLINICAL HISTORY: brain mass on ct brain mass on ct FINDINGS: BRAIN AND VENTRICLES: No acute infarct. No midline shift. No hydrocephalus. The sella is unremarkable. Normal flow voids. There is a 3.7 x 3.6 x 2.8 cm dominant heterogeneously enhancing mass in the right cerebellum with surrounding edema extending into the superior cerebellum and cerebellar vermis. This is associated with mass effect on the fourth ventricle and the right middle cerebellar peduncle. An additional 2.2 x 1.5 x 3.1 cm mass is present in the left occipital lobe with surrounding vasogenic edema and local mass effect. Edema partially extends to the left posterior aspect of the splenium and the corpus callosum. An additional 1.2 x 1.2 x 1.3 cm enhancing lesion is noted in the anterior right frontal lobe involving the right middle frontal gyrus with mild associated vasogenic edema. The left occipital and right cerebellar lesions demonstrate prominent areas of susceptibility suggestive of intralesional hemorrhage. Edema extending into the left periatrial white matter results in partial effacement of the atrium of the left  lateral ventricle as well as effacement of the left occipital and left temporal horns. Mass effect in the posterior fossa results in anterior displacement of the brainstem with crowding of the prepontine and cerebellopontine angle cisterns. There is anterior displacement and mass effect on the basilar artery without disruption of the flow void. There is mild ectopia of the right cerebellar tonsil extending below the foramen magnum by approximately 2.5 mm. Few scattered areas of T2 and FLAIR hyperintensity are present in the periventricular and subcortical white matter suggestive of mild chronic microvascular ischemic changes. ORBITS: No acute abnormality. SINUSES: Defect in the anterior nasal septum. BONES AND SOFT TISSUES: Normal bone marrow signal and enhancement. No acute soft tissue abnormality. IMPRESSION: 1. Dominant 3.7 x 3.6 x 2.8 cm heterogeneously enhancing mass in the right cerebellum with surrounding edema, mass effect on the fourth ventricle and right middle cerebellar peduncle, intralesional hemorrhage, and posterior fossa mass effect resulting in anterior displacement of the brainstem and basilar artery. 2. 2.2 x 1.5 x 3.1 cm mass in the left occipital lobe with surrounding edema, local mass effect, partial effacement of the left lateral ventricle. 3. 1.2 x 1.2 x 1.3 cm enhancing lesion in the anterior right frontal lobe with mild associated vasogenic edema. 4. Findings concerning for intracranial metastatic disease. Electronically signed by: Donnice Mania MD MD 07/23/2024 08:52 PM EST RP Workstation: [id number]   CT CHEST ABDOMEN PELVIS W CONTRAST Result Date: 07/23/2024 EXAM: CT CHEST, ABDOMEN AND PELVIS WITH CONTRAST 07/23/2024 07:34:49 PM TECHNIQUE: CT of the chest, abdomen and pelvis was performed with the administration of 100 mL of iohexol  (OMNIPAQUE ) 300 MG/ML solution. Multiplanar reformatted images are provided for review. Automated exposure control, iterative reconstruction, and/or weight  based adjustment of the mA/kV was utilized to reduce the radiation dose to as low as reasonably achievable. COMPARISON: Chest radiograph 07/20/2024, CT chest 06/19/2024, and CT abdomen and pelvis 05/06/2024. CLINICAL HISTORY: Metastatic disease evaluation; metastatic lung cancer. Ongoing nausea, vomiting, dizziness, and headaches. FINDINGS: CHEST: MEDIASTINUM AND LYMPH NODES: Left hilar mass measures 2.3 x 2.3 cm, unchanged since prior study. This  corresponds to non-treated neoplasm. No significant lymphadenopathy. The esophagus is not abnormally distended, but there is fluid in the esophagus suggesting reflux or dysmotility. Right central venous catheter with tip in the low SVC. Heart size is normal. No pericardial effusion. Calcification of the aorta. No aortic aneurysm or dissection. Central pulmonary arteries are patent without evidence of large pulmonary embolus. LUNGS AND PLEURA: Diffuse emphysematous changes in the lungs with scattered subpleural fibrosis. No focal consolidation or pulmonary edema. No pleural effusion or pneumothorax. Mild traction bronchiectasis in the bases. Scattered pulmonary nodules including representative nodule in the right upper lung, series 4 image 92, measuring 5 mm diameter. No change since previous study. ABDOMEN AND PELVIS: LIVER: Mild diffuse fatty infiltration of the liver. GALLBLADDER AND BILE DUCTS: Gallbladder and bile ducts are normal. No biliary ductal dilatation. SPLEEN: Spleen is normal. PANCREAS: Pancreas is normal. ADRENAL GLANDS: Adrenal glands are normal. KIDNEYS, URETERS AND BLADDER: Kidneys, ureters, and bladder are normal. No stones in the kidneys or ureters. No hydronephrosis. No perinephric or periureteral stranding. Urinary bladder is unremarkable. GI AND BOWEL: The stomach, small bowel, and colon are not abnormally distended. The stomach is filled with ingested material. The colon is stool-filled. No wall thickening or inflammatory stranding is noted. The  appendix is normal. There is no bowel obstruction. REPRODUCTIVE ORGANS: Prostate gland is enlarged. PERITONEUM AND RETROPERITONEUM: No ascites. No free air. VASCULATURE: Aorta is normal in caliber. Calcification of the aorta. No aneurysm. ABDOMINAL AND PELVIS LYMPH NODES: No lymphadenopathy. BONES AND SOFT TISSUES: Degenerative changes in the spine. No focal bone lesions. Compression of the L4 vertebra, unchanged since prior study. No focal soft tissue abnormality. IMPRESSION: 1. Left hilar mass measuring 2.3 x 2.3 cm, unchanged since prior study, consistent with known non-treated neoplasm. 2. No evidence of metastatic disease in the chest, abdomen, or pelvis. 3. Scattered pulmonary nodules, including a 5 mm right upper lobe nodule, unchanged since previous study. 4. Fluid in the esophagus, which can be seen with reflux or dysmotility. Electronically signed by: Elsie Gravely MD 07/23/2024 07:50 PM EST RP Workstation: [id number]   CT Head Wo Contrast Result Date: 07/23/2024 CLINICAL DATA:  New onset headache EXAM: CT HEAD WITHOUT CONTRAST TECHNIQUE: Contiguous axial images were obtained from the base of the skull through the vertex without intravenous contrast. RADIATION DOSE REDUCTION: This exam was performed according to the departmental dose-optimization program which includes automated exposure control, adjustment of the mA and/or kV according to patient size and/or use of iterative reconstruction technique. COMPARISON:  MRI 02/28/2024 FINDINGS: Brain: Interval development of a hyperdense mass within the left occipital lobe, this measures 2.8 x 1.8 by 2.9 cm. Considerable surrounding vasogenic edema. Interval heterogenous low-density in the right cerebellum with scattered areas of hyperdensity. Mass effect on fourth ventricle which is narrowed and displaced to the left. Hypodense edema probably extends to the right posterior pons. Additional focus of low density in the right frontal lobe. The ventricles are  slightly enlarged compared with the prior MRI. No midline shift Vascular: No hyperdense vessels.  No unexpected calcification Skull: Normal. Negative for fracture or focal lesion. Sinuses/Orbits: No acute finding. Other: None IMPRESSION: 1. Interval development of a 2.9 cm hyperdense (?hemorrhagic) mass within the left occipital lobe with considerable surrounding vasogenic edema. Interval heterogenous low-density in the right cerebellum with scattered areas of hyperdensity, also suspicious for mass lesion but with possible petechial foci of hemorrhage or mineralization. Additional focus of low density in the right frontal lobe. Findings are suspicious for metastatic  disease. Further evaluation with MRI with and without contrast is recommended. 2. Mass effect on the fourth ventricle which is narrowed and displaced to the left. Interval mild lateral and third ventricular enlargement compared with the prior MRI. Critical Value/emergent results were called by telephone at the time of interpretation on 07/23/2024 at 5:28 pm to provider Dr. Gennaro, Who verbally acknowledged these results. Electronically Signed   By: Luke Bun M.D.   On: 07/23/2024 17:30   DG Chest Port 1 View Result Date: 07/20/2024 EXAM: 1 VIEW(S) XRAY OF THE CHEST 07/20/2024 09:41:00 PM COMPARISON: Comparison with 06/01/2024. CLINICAL HISTORY: Nausea, vomiting, and dizziness for 2 days. FINDINGS: LINES, TUBES AND DEVICES: Port-type central venous catheter with tip over the low SVC (Superior Vena Cava) region. LUNGS AND PLEURA: Lungs are clear. Pulmonary vascularity is normal. No pleural effusion. No pneumothorax. HEART AND MEDIASTINUM: Heart size is normal. Mediastinal contours appear intact. Calcification of the aorta. BONES AND SOFT TISSUES: Postoperative changes in the right shoulder. No acute osseous abnormality. IMPRESSION: 1. No acute cardiopulmonary abnormality. Electronically signed by: Elsie Gravely MD 07/20/2024 09:54 PM EST RP  Workstation: [id number]     ASSESSMENT/PLAN:  This is a very pleasant 68 year old Caucasian male diagnosed with stage IV lung cancer, initially diagnosed as stage IIIa (T3, N2, M0) non-small cell lung cancer, adenocarcinoma.  The patient presented with a left suprahilar mass in addition to suspicious subcarinal lymphadenopathy.  The patient was diagnosed in August 2025. 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   The patient completed a course of concurrent chemoradiation with weekly carboplatin  for an AUC of 2 and paclitaxel  45 mg/m.  He is status post 8 treatments.  Treatment has been held on occasion due to thrombocytopenia.   He is undergoing consolidation immunotherapy with Imfinzi  1500 mg IV every 4 weeks. He is status post 1 cycle.   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 He underwent resection by Dr. Joshua on 07/25/24. He is expected to have SRS soon. The last day is expected on 08/20/24.   The patient was seen with Dr. Sherrod today. Labs were reviewed. Recommend he *** with cycle #2 of imfinzi .   We will see him back for labs and follow up in 1 month before undergoing cycle #3.   Decadron  ***  The patient was advised to call immediately if she has any concerning symptoms in the interval. The patient voices understanding of current disease status and treatment options and is in agreement with the current care plan. All questions were answered. The patient knows to call the clinic with any problems, questions or concerns. We can certainly see the patient much sooner if necessary     No orders of the defined types were placed in this encounter.    I spent {CHL ONC TIME VISIT - DTPQU:[id number]} counseling the patient face to face. The total time spent in the appointment was {CHL ONC TIME VISIT - DTPQU:8845999869}.  Will Schier L Dagon Budai, PA-C 07/31/24

## 2024-07-31 NOTE — Transitions of Care (Post Inpatient/ED Visit) (Signed)
 "  07/31/2024  Name: Charles Grimes MRN: [id number] DOB: 1956-07-05  Today's TOC FU Call Status: Today's TOC FU Call Status:: Successful TOC FU Call Completed TOC FU Call Complete Date: 07/31/24  Patient's Name and Date of Birth confirmed. Name, DOB  Transition Care Management Follow-up Telephone Call Date of Discharge: 07/29/24 Discharge Facility: [facility]) Type of Discharge: Inpatient Admission Primary Inpatient Discharge Diagnosis:: Craniectomy How have you been since you were released from the hospital?: Same Any questions or concerns?: No  Items Reviewed: Did you receive and understand the discharge instructions provided?: Yes Medications obtained,verified, and reconciled?: Yes (Medications Reviewed) Any new allergies since your discharge?: No Dietary orders reviewed?: Yes Type of Diet Ordered:: Low sodium Do you have support at home?: Yes People in Home [RPT]: child(ren), adult Name of Support/Comfort Primary Source: Jones Dollar  Medications Reviewed Today: Medications Reviewed Today     Reviewed by Moises Reusing, RN (Case Manager) on 07/31/24 at 1524  Med List Status: <None>   Medication Order Taking? Sig Documenting Provider Last Dose Status Informant  albuterol  (PROVENTIL ) (2.5 MG/3ML) 0.083% nebulizer solution [id number] No Take 3 mLs (2.5 mg total) by nebulization every 6 (six) hours as needed for wheezing or shortness of breath. Melvenia Motto, MD 07/24/2024 Active Self, Pharmacy Records  albuterol  (VENTOLIN  HFA) 108 (90 Base) MCG/ACT inhaler [id number] No Inhale 2 puffs into the lungs every 6 (six) hours as needed for wheezing or shortness of breath. Ruthell Lauraine FALCON, NP 07/24/2024 Active Self, Pharmacy Records  ALPRAZolam  (XANAX ) 0.5 MG tablet [id number] No Take 1 tablet (0.5 mg total) by mouth at bedtime as needed for anxiety. [name], DO Unknown Active Self, Pharmacy Records  alum & mag hydroxide-simeth (MAALOX MAX) 400-400-40 MG/5ML suspension  [id number] No Take 15 mLs by mouth every 6 (six) hours as needed for indigestion. Kingsley, Victoria K, DO Past Month Active Self, Pharmacy Records  arformoterol  (BROVANA ) 15 MCG/2ML NEBU [id number] No Take 2 mLs (15 mcg total) by nebulization 2 (two) times daily. Ruthell Lauraine FALCON, NP 07/22/2024 Active Self, Pharmacy Records  aspirin  EC 81 MG tablet [id number] No Take 81 mg by mouth daily. [provider] 07/21/2024 Active Self, Pharmacy Records  atorvastatin  (LIPITOR) 10 MG tablet [id number] No Take 1 tablet (10 mg total) by mouth daily. [name], DO 07/20/2024 Active Self, Pharmacy Records  budesonide  (PULMICORT ) 0.25 MG/2ML nebulizer solution [id number] No Take 2 mLs (0.25 mg total) by nebulization 2 (two) times daily. Ruthell Lauraine FALCON, NP 07/22/2024 Active Self, Pharmacy Records  budesonide -glycopyrrolate -formoterol (BREZTRI  AEROSPHERE) 160-9-4.8 MCG/ACT AERO inhaler [id number] No Inhale 2 puffs into the lungs in the morning and at bedtime.  Patient taking differently: Inhale 1 puff into the lungs in the morning and at bedtime.   Ruthell Lauraine FALCON, NP 07/22/2024 Active Self, Pharmacy Records  budesonide -glycopyrrolate -formoterol (BREZTRI  AEROSPHERE) 160-9-4.8 MCG/ACT AERO inhaler [id number] No Inhale 2 puffs into the lungs in the morning and at bedtime. Ruthell Lauraine FALCON, NP 07/22/2024 Active Self, Pharmacy Records  clotrimazole  (MYCELEX ) 10 MG troche [id number] No Take 1 tablet (10 mg total) by mouth 3 (three) times daily.  Patient taking differently: Take 10 mg by mouth daily.   Ruthell Lauraine FALCON, NP 07/22/2024 Active Self, Pharmacy Records  colchicine  0.6 MG tablet [id number] No Take 1 tablet (0.6 mg total) by mouth daily as needed (gout flares). Shelah Lamar RAMAN, MD Unknown Active Self, Pharmacy Records  dexamethasone  (DECADRON ) 4 MG tablet [id number]  Take 1 tablet (4 mg total) by mouth 3 (  three) times daily. Joshua Alm Hamilton, MD  Active   docusate sodium  (COLACE) 100 MG capsule [id number] No Take 1 capsule  (100 mg total) by mouth every 12 (twelve) hours. Mannie Fairy DASEN, DO Past Week Active Self, Pharmacy Records  HYDROcodone -acetaminophen  (NORCO/VICODIN) 5-325 MG tablet [id number] No Take 1 tablet by mouth every 6 (six) hours as needed for moderate pain (pain score 4-6). Shannon Agent, MD Past Week Active Self, Pharmacy Records  ipratropium (ATROVENT ) 0.02 % nebulizer solution [id number] No Take 2.5 mLs (0.5 mg total) by nebulization 4 (four) times daily.  Patient taking differently: Take 0.5 mg by nebulization every 4 (four) hours as needed for wheezing or shortness of breath.   Ruthell Lauraine FALCON, NP 07/22/2024 Active Self, Pharmacy Records  lidocaine  (XYLOCAINE ) 2 % solution [id number] No Use as directed 15 mLs in the mouth or throat every 4 (four) hours as needed for mouth pain. Kingsley, Victoria K, DO Unknown Active Self, Pharmacy Records  lisinopril  (ZESTRIL ) 10 MG tablet [id number] No Take 1 tablet (10 mg total) by mouth daily. [name], DO 07/22/2024 Active Self, Pharmacy Records  metoCLOPramide  (REGLAN ) 10 MG tablet [id number] No Take 1 tablet (10 mg total) by mouth every 8 (eight) hours as needed for nausea. Melvenia Motto, MD Past Month Active Self, Pharmacy Records  nitroGLYCERIN  (NITROSTAT ) 0.4 MG SL tablet [id number] No Place 1 tablet (0.4 mg total) under the tongue every 5 (five) minutes as needed for chest pain. [name], DO Unknown Active Self, Pharmacy Records           Med Note (MARROW, ERIN T   Sat Jul 26, 2024  8:20 AM) Pt has this medication available at home if needed; however, it's been over a month since he's needed it  NON FORMULARY [id number] No Pt uses a c-pap nightly [provider] 07/21/2024 Active Self, Pharmacy Records  omeprazole  (PRILOSEC ) 20 MG capsule [id number] No Take 1 capsule (20 mg total) by mouth daily. [name], DO 07/22/2024 Active Self, Pharmacy Records  ondansetron  (ZOFRAN -ODT) 4 MG disintegrating tablet [id number] No Take 1  tablet (4 mg total) by mouth every 8 (eight) hours as needed. Griselda Norris, MD Past Month Active Self, Pharmacy Records  polyethylene glycol powder (MIRALAX ) 17 GM/SCOOP powder [id number] No Take 17 g by mouth daily. Dissolve 1 capful (17g) in 4-8 ounces of liquid and take by mouth daily.  Patient taking differently: Take 17 g by mouth as needed for moderate constipation. Dissolve 1 capful (17g) in 4-8 ounces of liquid and take by mouth daily.   Mannie Fairy DASEN, DO Past Month Active Self, Pharmacy Records  sertraline  (ZOLOFT ) 50 MG tablet [id number] No Take 2 tabs in the morning and 1 tab in the evening.  Patient taking differently: Take 50 mg by mouth 2 (two) times daily. Take 2 tabs in the morning and 1 tab in the evening.   [name], DO 07/22/2024 Active Self, Pharmacy Records  Spacer/Aero-Holding Chambers (AEROCHAMBER PLUS) Device [id number]  Per patient request to help with inhaler management and effectiveness. Ruthell Lauraine FALCON, NP  Active Self, Pharmacy Records  sucralfate  (CARAFATE ) 1 g tablet [id number] No Take 1 tablet (1 g total) by mouth 4 (four) times daily. Dissolve each tablet in 15 cc water before use.  Patient not taking: Reported on 07/26/2024   Ellouise Richerd POUR, DO Not Taking Active Self, Pharmacy Records  tiZANidine  (ZANAFLEX ) 4 MG tablet [id number] No Take 1 tablet (4 mg total) by mouth at bedtime as  needed for muscle spasms. [name], DO Unknown Active Self, Pharmacy Records  triamcinolone  cream (KENALOG ) 0.1 % [id number] No Apply 1 Application topically 2 (two) times daily as needed (itch). [name], DO Past Week Active Self, Pharmacy Records            Home Care and Equipment/Supplies: Were Home Health Services Ordered?: Yes Name of Home Health Agency:: [facility] Has Agency set up a time to come to your home?: Yes First Home Health Visit Date: 07/31/24 Any new equipment or medical supplies ordered?: Yes Name of Medical supply  agency?: [facility] Were you able to get the equipment/medical supplies?: No (Waiting on the delivery today) Do you have any questions related to the use of the equipment/supplies?: No  Functional Questionnaire: Do you need assistance with bathing/showering or dressing?: Yes (Supervision) Do you need assistance with meal preparation?: Yes Do you need assistance with eating?: No Do you have difficulty maintaining continence: No Do you need assistance with getting out of bed/getting out of a chair/moving?: No Do you have difficulty managing or taking your medications?: Yes (The son prepares the medications)  Follow up appointments reviewed: PCP Follow-up appointment confirmed?: No (The patient states he has too many cancer related appointments right now) MD Provider Line [phone] Given: No Specialist Hospital Follow-up appointment confirmed?: Yes Date of Specialist follow-up appointment?: 08/06/24 Follow-Up Specialty Provider:: Dr. Nina Do you need transportation to your follow-up appointment?: No Do you understand care options if your condition(s) worsen?: Yes-patient verbalized understanding  SDOH Interventions Today    Flowsheet Row Most Recent Value  SDOH Interventions   Food Insecurity Interventions Intervention Not Indicated  Housing Interventions Intervention Not Indicated  Transportation Interventions Intervention Not Indicated  Utilities Interventions Intervention Not Indicated    Medford Balboa, BSN, RN [place]  [facility]BC[facility] - Population Health RN Care Manager [phone]  "

## 2024-08-03 ENCOUNTER — Other Ambulatory Visit: Payer: Self-pay | Admitting: Family Medicine

## 2024-08-03 DIAGNOSIS — Z634 Disappearance and death of family member: Secondary | ICD-10-CM

## 2024-08-04 ENCOUNTER — Other Ambulatory Visit: Payer: Self-pay

## 2024-08-04 ENCOUNTER — Telehealth: Payer: Self-pay

## 2024-08-04 NOTE — Telephone Encounter (Signed)
 Called spoke with [facility], per DR.Frann looks like ER rxd the Reglan .

## 2024-08-04 NOTE — Telephone Encounter (Signed)
 Copied from CRM [id number]. Topic: Clinical - Prescription Issue >> Aug 04, 2024 11:43 AM Robinson H wrote: Reason for CRM: Notify provider of drug interaction with metoCLOPramide  (REGLAN ) 10 MG tablet and sertraline  (ZOLOFT ) 50 MG tablet. Severity level 2  Charles Grimes [phone]

## 2024-08-04 NOTE — Telephone Encounter (Signed)
 Looks like ER rxd the Reglan . Is he even taking it still?

## 2024-08-04 NOTE — Telephone Encounter (Signed)
 Copied from CRM [id number]. Topic: Clinical - Home Health Verbal Orders >> Aug 04, 2024 11:40 AM Robinson DEL wrote: Caller/Agency: [facility] Number: [phone] Service Requested: Physical Therapy Frequency: 1 week 5 starting 07/30/2024 Any new concerns about the patient? No  Called verbal order was give for PT and advised about medication per Dr.Wendling that not something he prescribe.

## 2024-08-06 ENCOUNTER — Inpatient Hospital Stay

## 2024-08-06 ENCOUNTER — Inpatient Hospital Stay: Admitting: Physician Assistant

## 2024-08-06 VITALS — BP 119/76 | HR 66 | Temp 97.6°F | Resp 17 | Ht 71.0 in | Wt 161.8 lb

## 2024-08-06 DIAGNOSIS — B370 Candidal stomatitis: Secondary | ICD-10-CM

## 2024-08-06 DIAGNOSIS — E860 Dehydration: Secondary | ICD-10-CM | POA: Diagnosis not present

## 2024-08-06 DIAGNOSIS — C3412 Malignant neoplasm of upper lobe, left bronchus or lung: Secondary | ICD-10-CM

## 2024-08-06 LAB — CBC WITH DIFFERENTIAL (CANCER CENTER ONLY)
Abs Immature Granulocytes: 0.12 K/uL — ABNORMAL HIGH (ref 0.00–0.07)
Basophils Absolute: 0 K/uL (ref 0.0–0.1)
Basophils Relative: 0 %
Eosinophils Absolute: 0 K/uL (ref 0.0–0.5)
Eosinophils Relative: 0 %
HCT: 36.6 % — ABNORMAL LOW (ref 39.0–52.0)
Hemoglobin: 12.3 g/dL — ABNORMAL LOW (ref 13.0–17.0)
Immature Granulocytes: 1 %
Lymphocytes Relative: 2 %
Lymphs Abs: 0.4 K/uL — ABNORMAL LOW (ref 0.7–4.0)
MCH: 32.6 pg (ref 26.0–34.0)
MCHC: 33.6 g/dL (ref 30.0–36.0)
MCV: 97.1 fL (ref 80.0–100.0)
Monocytes Absolute: 0.5 K/uL (ref 0.1–1.0)
Monocytes Relative: 3 %
Neutro Abs: 16.9 K/uL — ABNORMAL HIGH (ref 1.7–7.7)
Neutrophils Relative %: 94 %
Platelet Count: 158 K/uL (ref 150–400)
RBC: 3.77 MIL/uL — ABNORMAL LOW (ref 4.22–5.81)
RDW: 14 % (ref 11.5–15.5)
WBC Count: 17.9 K/uL — ABNORMAL HIGH (ref 4.0–10.5)
nRBC: 0 % (ref 0.0–0.2)

## 2024-08-06 LAB — CMP (CANCER CENTER ONLY)
ALT: 66 U/L — ABNORMAL HIGH (ref 0–44)
AST: 25 U/L (ref 15–41)
Albumin: 3.6 g/dL (ref 3.5–5.0)
Alkaline Phosphatase: 97 U/L (ref 38–126)
Anion gap: 11 (ref 5–15)
BUN: 36 mg/dL — ABNORMAL HIGH (ref 8–23)
CO2: 23 mmol/L (ref 22–32)
Calcium: 8.4 mg/dL — ABNORMAL LOW (ref 8.9–10.3)
Chloride: 102 mmol/L (ref 98–111)
Creatinine: 0.9 mg/dL (ref 0.61–1.24)
GFR, Estimated: >60 mL/min
Glucose, Bld: 120 mg/dL — ABNORMAL HIGH (ref 70–99)
Potassium: 4.4 mmol/L (ref 3.5–5.1)
Sodium: 136 mmol/L (ref 135–145)
Total Bilirubin: 0.8 mg/dL (ref 0.0–1.2)
Total Protein: 6.3 g/dL — ABNORMAL LOW (ref 6.5–8.1)

## 2024-08-06 MED ORDER — FLUCONAZOLE 100 MG PO TABS: 100.0000 mg | ORAL_TABLET | Freq: Every day | ORAL | 0 refills | Status: AC

## 2024-08-10 ENCOUNTER — Encounter: Payer: Self-pay | Admitting: Internal Medicine

## 2024-08-12 ENCOUNTER — Ambulatory Visit: Admitting: Radiation Oncology

## 2024-08-12 ENCOUNTER — Ambulatory Visit (HOSPITAL_COMMUNITY)
Admission: RE | Admit: 2024-08-12 | Discharge: 2024-08-12 | Disposition: A | Discharge status: Home / Self Care | Source: Ambulatory Visit | Attending: Radiation Oncology | Admitting: Radiation Oncology

## 2024-08-12 DIAGNOSIS — C7931 Secondary malignant neoplasm of brain: Secondary | ICD-10-CM | POA: Diagnosis present

## 2024-08-12 MED ORDER — GADOBUTROL 1 MMOL/ML IV SOLN
7.0000 mL | Freq: Once | INTRAVENOUS | Status: AC | PRN
  Administered 2024-08-12: 7 mL via INTRAVENOUS

## 2024-08-12 MED ORDER — HEPARIN SOD (PORK) LOCK FLUSH 100 UNIT/ML IV SOLN
500.0000 [IU] | INTRAVENOUS | Status: AC | PRN
  Administered 2024-08-12: 500 [IU]

## 2024-08-12 NOTE — Progress Notes (Signed)
 Has armband been applied?  {yes no:314532}  Does patient have an allergy to IV contrast dye?: {yes no:314532}   Has patient ever received premedication for IV contrast dye?: {yes no:314532}   Does patient take metformin?: {yes no:314532}  If patient does take metformin when was the last dose: {Time; dates multiple:15870}  Date of lab work: 08/06/2024 BUN: 36 CR: 0.90 eGfr: >60  IV site: Right Chest Port  Has IV site been added to flowsheet?  {yes no:314532}  There were no vitals taken for this visit.

## 2024-08-13 ENCOUNTER — Ambulatory Visit
Admission: RE | Admit: 2024-08-13 | Discharge: 2024-08-13 | Disposition: A | Source: Ambulatory Visit | Attending: Radiation Oncology

## 2024-08-13 ENCOUNTER — Ambulatory Visit: Admitting: Radiation Oncology

## 2024-08-13 ENCOUNTER — Ambulatory Visit
Admission: RE | Admit: 2024-08-13 | Discharge: 2024-08-13 | Disposition: A | Discharge status: Home / Self Care | Source: Ambulatory Visit | Attending: Radiation Oncology | Admitting: Radiation Oncology

## 2024-08-15 ENCOUNTER — Encounter: Payer: Self-pay | Admitting: Radiation Oncology

## 2024-08-15 ENCOUNTER — Encounter: Payer: Self-pay | Admitting: Internal Medicine

## 2024-08-15 ENCOUNTER — Ambulatory Visit
Admission: RE | Admit: 2024-08-15 | Discharge: 2024-08-15 | Disposition: A | Source: Ambulatory Visit | Attending: Radiation Oncology | Admitting: Radiation Oncology

## 2024-08-15 ENCOUNTER — Other Ambulatory Visit: Payer: Self-pay

## 2024-08-15 LAB — RAD ONC ARIA SESSION SUMMARY
Course Elapsed Days: 0
Plan Fractions Treated to Date: 1
Plan Prescribed Dose Per Fraction: 9 Gy
Plan Total Fractions Prescribed: 1
Plan Total Prescribed Dose: 9 Gy
Reference Point Dosage Given to Date: 9 Gy
Reference Point Session Dosage Given: 9 Gy
Session Number: 1

## 2024-08-15 NOTE — Progress Notes (Signed)
 Mr. Gribble rested with us  for 15 minutes following his SRS treatment.  Patient denies headache, dizziness, nausea, diplopia or ringing in the ears. Denies fatigue. Patient without complaints. Understands to avoid strenuous activity for the next 24 hours and call [phone] with needs.   BP 117/89 (BP Location: Left Arm, Patient Position: Sitting, Cuff Size: Large)   Pulse 74   Temp 97.6 F (36.4 C)   Resp 20   Ht 5' 11 (1.803 m)   Wt 160 lb (72.6 kg)   SpO2 100%   BMI 22.32 kg/m

## 2024-08-15 NOTE — Progress Notes (Signed)
 Patient ID: BRANDLEY ALDRETE, male   DOB: [birth year], 69 y.o.   MRN: [id number]  Name: ENMANUEL ZUFALL  MRN: [id number]  Date: 08/15/2024   DOB: 25-Oct-1955  Stereotactic Radiosurgery Operative Note  PRE-OPERATIVE DIAGNOSIS:  Multiple Brain Metastases  POST-OPERATIVE DIAGNOSIS:  Multiple Brain Metastases  PROCEDURE:  Stereotactic Radiosurgery  SURGEON:  Alm GORMAN Molt, MD  NARRATIVE: The patient underwent a radiation treatment planning session in the radiation oncology simulation suite under the care of the radiation oncology physician and physicist.  I participated closely in the radiation treatment planning afterwards. The patient underwent planning CT which was fused to 3T high resolution MRI with 1 mm axial slices.  These images were fused on the planning system.  We contoured the gross target volumes and subsequently expanded this to yield the Planning Target Volume. I actively participated in the planning process.  I helped to define and review the target contours and also the contours of the optic pathway, eyes, brainstem and selected nearby organs at risk.  All the dose constraints for critical structures were reviewed and compared to AAPM Task Group 101.  The prescription dose conformity was reviewed.  I approved the plan electronically.    Accordingly, Nancyann JONELLE Dollar was brought to the TrueBeam stereotactic radiation treatment linac and placed in the custom immobilization mask.  The patient was aligned according to the IR fiducial markers with BrainLab Exactrac, then orthogonal x-rays were used in ExacTrac with the 6DOF robotic table and the shifts were made to align the patient  Nancyann JONELLE Dollar received stereotactic radiosurgery uneventfully.    Lesions treated:  3   Complex lesions treated:  0 (>3.5 cm, <79mm of optic path, or within the brainstem)   The detailed description of the procedure is recorded in the radiation oncology procedure note.  I was present for the duration of the  procedure.  DISPOSITION:  Following delivery, the patient was transported to nursing in stable condition and monitored for possible acute effects to be discharged to home in stable condition with follow-up in one month.  Alm GORMAN Molt, MD 08/15/2024 11:46 AM

## 2024-08-18 ENCOUNTER — Ambulatory Visit: Admitting: Radiation Oncology

## 2024-08-19 ENCOUNTER — Ambulatory Visit
Admission: EM | Admit: 2024-08-19 | Discharge: 2024-08-19 | Disposition: A | Discharge status: Home / Self Care | Source: Home / Self Care

## 2024-08-19 ENCOUNTER — Other Ambulatory Visit: Payer: Self-pay

## 2024-08-19 ENCOUNTER — Ambulatory Visit: Admitting: Radiology

## 2024-08-19 DIAGNOSIS — R0789 Other chest pain: Secondary | ICD-10-CM

## 2024-08-19 MED ORDER — LIDOCAINE 5 % EX PTCH: 1.0000 | MEDICATED_PATCH | CUTANEOUS | 0 refills | Status: AC

## 2024-08-19 NOTE — Discharge Instructions (Addendum)
 You were seen today for right sided rib pain. Your xray was reviewed by radiology and they did not seem evidence of a fracture or obvious injury.  At this time I suspect you have injured the soft tissues or cartilage between the ribs which is likely causing your continued discomfort. You are already taking Decadron  from a previous prescription which will help with inflammation and irritation.  I am sending in a refill of lidocaine  patches for you to use once per day on the affected area.  Please make sure that you are taking deep breaths several times per hour.  I also recommend gentle massage, warm compresses to the area for further pain control.  If you feel like your symptoms are not improving or seem to be worsening over the next 1 to 2 weeks please follow-up with your primary care provider for further evaluation.

## 2024-08-19 NOTE — ED Triage Notes (Signed)
 Pt presents to urgent care following a fall that occurred approximately two weeks ago. States he slipped and fell on ice. Denies hitting head. Currently voicing pain in right back/rib area. Rates as a 7/10 on pain scale. No OTC medications taken PTA for pain reported. Reports it was difficult to sleep last night due to the pain. Takes aspirin  81 mg daily.

## 2024-08-20 ENCOUNTER — Ambulatory Visit
Admission: RE | Admit: 2024-08-20 | Discharge: 2024-08-20 | Disposition: A | Source: Ambulatory Visit | Attending: Radiation Oncology | Admitting: Radiation Oncology

## 2024-08-20 ENCOUNTER — Other Ambulatory Visit: Payer: Self-pay

## 2024-08-20 LAB — RAD ONC ARIA SESSION SUMMARY
Course Elapsed Days: 5
Plan Fractions Treated to Date: 1
Plan Prescribed Dose Per Fraction: 9 Gy
Plan Total Fractions Prescribed: 2
Plan Total Prescribed Dose: 18 Gy
Reference Point Dosage Given to Date: 18 Gy
Reference Point Session Dosage Given: 9 Gy
Session Number: 2

## 2024-08-20 NOTE — Progress Notes (Signed)
 Charles Grimes rested with us  for 15 minutes following his SRS treatment.  Patient denies headache, dizziness, nausea, diplopia or ringing in the ears. Denies fatigue. Patient without complaints. Understands to avoid strenuous activity for the next 24 hours and call [phone] with needs.   BP 104/89 (BP Location: Left Arm)   Pulse (!) 112   Temp 98.2 F (36.8 C) (Temporal)   Resp 18   SpO2 100%

## 2024-08-21 ENCOUNTER — Encounter: Payer: Self-pay | Admitting: Internal Medicine

## 2024-08-21 ENCOUNTER — Encounter: Payer: Self-pay | Admitting: Radiation Oncology

## 2024-08-21 NOTE — Progress Notes (Signed)
" °  Radiation Oncology         [phone]) [phone] ________________________________  Name: Charles Grimes  FMW:[id number]  Date of Service: 08/21/24  DOB: March 28, 1956   Steroid Taper Instructions   You currently have a prescription for Dexamethasone  4 mg Tablets.   Beginning 08/25/24  Take a 4 mg tablet twice a day  Beginning 09/01/24: Take 1/2 of a tablet (which is 2 mg) twice a day  Beginning 09/08/24: Take 1/2 of a tablet (which is 2 mg) once a day  Beginning 09/15/24: Take 1/2 of a tablet (which is 2 mg) every other day and stop on 09/20/24.   Please call our office if you have any headaches, visual changes, uncontrolled movements, extremity weakness, nausea or vomiting.    "

## 2024-08-22 ENCOUNTER — Ambulatory Visit: Admission: RE | Admit: 2024-08-22 | Admitting: Radiation Oncology

## 2024-08-22 ENCOUNTER — Other Ambulatory Visit: Payer: Self-pay | Admitting: Radiation Therapy

## 2024-08-22 ENCOUNTER — Other Ambulatory Visit: Payer: Self-pay

## 2024-08-22 DIAGNOSIS — C7931 Secondary malignant neoplasm of brain: Secondary | ICD-10-CM

## 2024-08-22 LAB — RAD ONC ARIA SESSION SUMMARY
Course Elapsed Days: 7
Plan Fractions Treated to Date: 2
Plan Prescribed Dose Per Fraction: 9 Gy
Plan Total Fractions Prescribed: 2
Plan Total Prescribed Dose: 18 Gy
Reference Point Dosage Given to Date: 27 Gy
Reference Point Session Dosage Given: 9 Gy
Session Number: 3

## 2024-08-22 NOTE — Progress Notes (Signed)
 Mr. Say rested with us  for 15 minutes following his SRS treatment.  Patient denies headache, dizziness, nausea, diplopia or ringing in the ears. Denies fatigue. Patient without complaints. Understands to avoid strenuous activity for the next 24 hours and call [phone] with needs.   BP 103/83 (BP Location: Left Arm, Patient Position: Sitting, Cuff Size: Large)   Pulse 85   Temp (!) 97.4 F (36.3 C)   Resp 20   Ht 5' 11 (1.803 m)   SpO2 99%   BMI 22.32 kg/m

## 2024-09-03 ENCOUNTER — Inpatient Hospital Stay: Attending: Internal Medicine

## 2024-09-03 ENCOUNTER — Inpatient Hospital Stay

## 2024-09-03 ENCOUNTER — Inpatient Hospital Stay: Admitting: Internal Medicine

## 2024-09-22 ENCOUNTER — Ambulatory Visit

## 2024-10-01 ENCOUNTER — Inpatient Hospital Stay

## 2024-10-01 ENCOUNTER — Inpatient Hospital Stay: Attending: Internal Medicine | Admitting: Internal Medicine

## 2024-10-07 ENCOUNTER — Ambulatory Visit

## 2024-11-24 ENCOUNTER — Inpatient Hospital Stay: Attending: Internal Medicine

## 2024-11-24 ENCOUNTER — Ambulatory Visit: Admitting: Radiation Oncology
